# Patient Record
Sex: Female | Born: 1955
Health system: Southern US, Community
[De-identification: ages and names within clinical notes are randomized; demographics above are authoritative.]

## PROBLEM LIST (undated history)

## (undated) DIAGNOSIS — E559 Vitamin D deficiency, unspecified: Secondary | ICD-10-CM

## (undated) DIAGNOSIS — G4730 Sleep apnea, unspecified: Secondary | ICD-10-CM

## (undated) DIAGNOSIS — M1990 Unspecified osteoarthritis, unspecified site: Secondary | ICD-10-CM

## (undated) DIAGNOSIS — F329 Major depressive disorder, single episode, unspecified: Secondary | ICD-10-CM

## (undated) DIAGNOSIS — R42 Dizziness and giddiness: Secondary | ICD-10-CM

## (undated) DIAGNOSIS — I10 Essential (primary) hypertension: Secondary | ICD-10-CM

## (undated) DIAGNOSIS — M549 Dorsalgia, unspecified: Secondary | ICD-10-CM

## (undated) DIAGNOSIS — I739 Peripheral vascular disease, unspecified: Secondary | ICD-10-CM

## (undated) DIAGNOSIS — M2550 Pain in unspecified joint: Secondary | ICD-10-CM

## (undated) DIAGNOSIS — T8859XA Other complications of anesthesia, initial encounter: Secondary | ICD-10-CM

## (undated) DIAGNOSIS — E119 Type 2 diabetes mellitus without complications: Secondary | ICD-10-CM

## (undated) DIAGNOSIS — I499 Cardiac arrhythmia, unspecified: Secondary | ICD-10-CM

## (undated) DIAGNOSIS — F419 Anxiety disorder, unspecified: Secondary | ICD-10-CM

## (undated) DIAGNOSIS — R0602 Shortness of breath: Secondary | ICD-10-CM

## (undated) DIAGNOSIS — E785 Hyperlipidemia, unspecified: Secondary | ICD-10-CM

## (undated) DIAGNOSIS — I6529 Occlusion and stenosis of unspecified carotid artery: Secondary | ICD-10-CM

## (undated) DIAGNOSIS — R011 Cardiac murmur, unspecified: Secondary | ICD-10-CM

## (undated) DIAGNOSIS — R7302 Impaired glucose tolerance (oral): Secondary | ICD-10-CM

## (undated) DIAGNOSIS — E669 Obesity, unspecified: Secondary | ICD-10-CM

## (undated) DIAGNOSIS — F32A Depression, unspecified: Secondary | ICD-10-CM

## (undated) DIAGNOSIS — C4491 Basal cell carcinoma of skin, unspecified: Secondary | ICD-10-CM

## (undated) DIAGNOSIS — Z9289 Personal history of other medical treatment: Secondary | ICD-10-CM

## (undated) DIAGNOSIS — J301 Allergic rhinitis due to pollen: Secondary | ICD-10-CM

## (undated) DIAGNOSIS — K5900 Constipation, unspecified: Secondary | ICD-10-CM

## (undated) DIAGNOSIS — M722 Plantar fascial fibromatosis: Secondary | ICD-10-CM

## (undated) DIAGNOSIS — I2510 Atherosclerotic heart disease of native coronary artery without angina pectoris: Secondary | ICD-10-CM

## (undated) DIAGNOSIS — R600 Localized edema: Secondary | ICD-10-CM

## (undated) HISTORY — DX: Occlusion and stenosis of unspecified carotid artery: I65.29

## (undated) HISTORY — DX: Allergic rhinitis due to pollen: J30.1

## (undated) HISTORY — PX: KNEE SURGERY: SHX244

## (undated) HISTORY — DX: Plantar fascial fibromatosis: M72.2

## (undated) HISTORY — DX: Obesity, unspecified: E66.9

## (undated) HISTORY — DX: Dizziness and giddiness: R42

## (undated) HISTORY — DX: Unspecified osteoarthritis, unspecified site: M19.90

## (undated) HISTORY — DX: Hyperlipidemia, unspecified: E78.5

## (undated) HISTORY — DX: Anxiety disorder, unspecified: F41.9

## (undated) HISTORY — DX: Impaired glucose tolerance (oral): R73.02

## (undated) HISTORY — PX: TONSILLECTOMY: SUR1361

## (undated) HISTORY — DX: Cardiac murmur, unspecified: R01.1

## (undated) HISTORY — DX: Depression, unspecified: F32.A

## (undated) HISTORY — DX: Constipation, unspecified: K59.00

## (undated) HISTORY — DX: Dorsalgia, unspecified: M54.9

## (undated) HISTORY — PX: CARDIAC CATHETERIZATION: SHX172

## (undated) HISTORY — PX: ROTATOR CUFF REPAIR: SHX139

## (undated) HISTORY — DX: Pain in unspecified joint: M25.50

## (undated) HISTORY — PX: CARPAL TUNNEL RELEASE: SHX101

## (undated) HISTORY — DX: Vitamin D deficiency, unspecified: E55.9

## (undated) HISTORY — PX: COLONOSCOPY: SHX174

---
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
## 1999-04-22 ENCOUNTER — Encounter (INDEPENDENT_AMBULATORY_CARE_PROVIDER_SITE_OTHER): Payer: Self-pay | Admitting: Specialist

## 1999-04-22 ENCOUNTER — Ambulatory Visit (HOSPITAL_COMMUNITY): Admission: RE | Admit: 1999-04-22 | Discharge: 1999-04-22 | Payer: Self-pay | Admitting: Family Medicine

## 1999-04-22 ENCOUNTER — Encounter: Payer: Self-pay | Admitting: Family Medicine

## 1999-10-22 ENCOUNTER — Encounter: Payer: Self-pay | Admitting: Internal Medicine

## 1999-10-22 ENCOUNTER — Ambulatory Visit (HOSPITAL_COMMUNITY): Admission: RE | Admit: 1999-10-22 | Discharge: 1999-10-22 | Payer: Self-pay | Admitting: Internal Medicine

## 2000-04-21 ENCOUNTER — Encounter: Admission: RE | Admit: 2000-04-21 | Discharge: 2000-04-21 | Payer: Self-pay | Admitting: Family Medicine

## 2000-04-21 ENCOUNTER — Encounter: Payer: Self-pay | Admitting: Family Medicine

## 2001-07-26 ENCOUNTER — Ambulatory Visit (HOSPITAL_COMMUNITY): Admission: RE | Admit: 2001-07-26 | Discharge: 2001-07-26 | Payer: Self-pay | Admitting: Family Medicine

## 2001-07-26 ENCOUNTER — Encounter: Payer: Self-pay | Admitting: Family Medicine

## 2003-05-20 ENCOUNTER — Encounter (INDEPENDENT_AMBULATORY_CARE_PROVIDER_SITE_OTHER): Payer: Self-pay | Admitting: Sports Medicine

## 2003-05-20 ENCOUNTER — Encounter: Admission: RE | Admit: 2003-05-20 | Discharge: 2003-05-20 | Payer: Self-pay | Admitting: Sports Medicine

## 2003-05-27 HISTORY — PX: CARPAL TUNNEL RELEASE: SHX101

## 2003-05-27 HISTORY — PX: BREAST BIOPSY: SHX20

## 2003-06-03 ENCOUNTER — Encounter: Admission: RE | Admit: 2003-06-03 | Discharge: 2003-09-01 | Payer: Self-pay | Admitting: Sports Medicine

## 2003-06-17 ENCOUNTER — Encounter: Admission: RE | Admit: 2003-06-17 | Discharge: 2003-06-17 | Payer: Self-pay | Admitting: Sports Medicine

## 2003-09-13 DIAGNOSIS — C4491 Basal cell carcinoma of skin, unspecified: Secondary | ICD-10-CM

## 2003-09-13 HISTORY — DX: Basal cell carcinoma of skin, unspecified: C44.91

## 2004-06-23 ENCOUNTER — Ambulatory Visit: Payer: Self-pay | Admitting: Sports Medicine

## 2004-07-13 ENCOUNTER — Encounter (INDEPENDENT_AMBULATORY_CARE_PROVIDER_SITE_OTHER): Payer: Self-pay | Admitting: Sports Medicine

## 2004-07-13 ENCOUNTER — Encounter (INDEPENDENT_AMBULATORY_CARE_PROVIDER_SITE_OTHER): Payer: Self-pay | Admitting: *Deleted

## 2004-07-13 ENCOUNTER — Ambulatory Visit: Payer: Self-pay | Admitting: Sports Medicine

## 2004-07-28 ENCOUNTER — Ambulatory Visit (HOSPITAL_COMMUNITY): Admission: RE | Admit: 2004-07-28 | Discharge: 2004-07-28 | Payer: Self-pay | Admitting: Sports Medicine

## 2004-09-21 ENCOUNTER — Ambulatory Visit: Payer: Self-pay | Admitting: Sports Medicine

## 2005-01-18 ENCOUNTER — Ambulatory Visit: Payer: Self-pay | Admitting: Sports Medicine

## 2005-02-15 ENCOUNTER — Ambulatory Visit (HOSPITAL_COMMUNITY): Admission: RE | Admit: 2005-02-15 | Discharge: 2005-02-15 | Payer: Self-pay | Admitting: Sports Medicine

## 2005-02-15 ENCOUNTER — Ambulatory Visit: Payer: Self-pay | Admitting: Sports Medicine

## 2005-03-23 ENCOUNTER — Ambulatory Visit (HOSPITAL_COMMUNITY): Admission: RE | Admit: 2005-03-23 | Discharge: 2005-03-23 | Payer: Self-pay | Admitting: Sports Medicine

## 2005-03-23 ENCOUNTER — Ambulatory Visit: Payer: Self-pay | Admitting: Cardiology

## 2005-03-23 ENCOUNTER — Encounter: Payer: Self-pay | Admitting: Cardiology

## 2005-04-05 ENCOUNTER — Ambulatory Visit: Payer: Self-pay | Admitting: Sports Medicine

## 2005-04-11 ENCOUNTER — Ambulatory Visit: Payer: Self-pay | Admitting: Cardiology

## 2005-05-11 ENCOUNTER — Ambulatory Visit (HOSPITAL_COMMUNITY): Admission: RE | Admit: 2005-05-11 | Discharge: 2005-05-11 | Payer: Self-pay | Admitting: Sports Medicine

## 2005-08-16 ENCOUNTER — Ambulatory Visit: Payer: Self-pay | Admitting: Sports Medicine

## 2005-08-31 ENCOUNTER — Ambulatory Visit (HOSPITAL_COMMUNITY): Admission: RE | Admit: 2005-08-31 | Discharge: 2005-08-31 | Payer: Self-pay | Admitting: Family Medicine

## 2006-01-31 ENCOUNTER — Ambulatory Visit: Payer: Self-pay | Admitting: Sports Medicine

## 2006-02-01 ENCOUNTER — Encounter (INDEPENDENT_AMBULATORY_CARE_PROVIDER_SITE_OTHER): Payer: Self-pay | Admitting: *Deleted

## 2006-02-01 ENCOUNTER — Ambulatory Visit (HOSPITAL_COMMUNITY): Admission: RE | Admit: 2006-02-01 | Discharge: 2006-02-01 | Payer: Self-pay | Admitting: Sports Medicine

## 2006-02-07 ENCOUNTER — Ambulatory Visit: Payer: Self-pay | Admitting: Sports Medicine

## 2006-03-02 ENCOUNTER — Ambulatory Visit: Payer: Self-pay | Admitting: Sports Medicine

## 2006-04-04 ENCOUNTER — Ambulatory Visit: Payer: Self-pay | Admitting: Sports Medicine

## 2006-05-17 ENCOUNTER — Ambulatory Visit: Payer: Self-pay | Admitting: Family Medicine

## 2006-05-22 ENCOUNTER — Encounter: Admission: RE | Admit: 2006-05-22 | Discharge: 2006-05-22 | Payer: Self-pay | Admitting: Family Medicine

## 2006-05-22 ENCOUNTER — Ambulatory Visit: Payer: Self-pay | Admitting: Family Medicine

## 2006-05-30 ENCOUNTER — Ambulatory Visit: Payer: Self-pay | Admitting: Sports Medicine

## 2006-06-02 ENCOUNTER — Encounter: Admission: RE | Admit: 2006-06-02 | Discharge: 2006-06-02 | Payer: Self-pay | Admitting: Sports Medicine

## 2006-06-06 ENCOUNTER — Ambulatory Visit: Payer: Self-pay | Admitting: Sports Medicine

## 2006-09-19 ENCOUNTER — Ambulatory Visit (HOSPITAL_COMMUNITY): Admission: RE | Admit: 2006-09-19 | Discharge: 2006-09-19 | Payer: Self-pay | Admitting: Family Medicine

## 2006-11-09 DIAGNOSIS — IMO0002 Reserved for concepts with insufficient information to code with codable children: Secondary | ICD-10-CM | POA: Insufficient documentation

## 2006-11-09 DIAGNOSIS — I10 Essential (primary) hypertension: Secondary | ICD-10-CM | POA: Insufficient documentation

## 2006-11-09 DIAGNOSIS — M1710 Unilateral primary osteoarthritis, unspecified knee: Secondary | ICD-10-CM

## 2006-11-09 DIAGNOSIS — E669 Obesity, unspecified: Secondary | ICD-10-CM | POA: Insufficient documentation

## 2006-11-10 ENCOUNTER — Encounter (INDEPENDENT_AMBULATORY_CARE_PROVIDER_SITE_OTHER): Payer: Self-pay | Admitting: *Deleted

## 2007-01-08 ENCOUNTER — Telehealth: Payer: Self-pay | Admitting: *Deleted

## 2007-01-08 ENCOUNTER — Ambulatory Visit: Payer: Self-pay | Admitting: Sports Medicine

## 2007-01-08 DIAGNOSIS — J301 Allergic rhinitis due to pollen: Secondary | ICD-10-CM | POA: Insufficient documentation

## 2007-04-23 ENCOUNTER — Ambulatory Visit: Payer: Self-pay | Admitting: Family Medicine

## 2007-04-23 ENCOUNTER — Encounter: Payer: Self-pay | Admitting: Family Medicine

## 2007-04-23 LAB — CONVERTED CEMR LAB
AST: 20 units/L (ref 0–37)
BUN: 11 mg/dL (ref 6–23)
Calcium: 9 mg/dL (ref 8.4–10.5)
Potassium: 4.1 meq/L (ref 3.5–5.3)
Sodium: 139 meq/L (ref 135–145)
Total Bilirubin: 0.5 mg/dL (ref 0.3–1.2)

## 2007-04-25 ENCOUNTER — Encounter: Payer: Self-pay | Admitting: Family Medicine

## 2007-04-27 ENCOUNTER — Encounter: Payer: Self-pay | Admitting: Family Medicine

## 2007-05-02 ENCOUNTER — Encounter: Payer: Self-pay | Admitting: *Deleted

## 2007-05-08 ENCOUNTER — Encounter: Payer: Self-pay | Admitting: Family Medicine

## 2007-05-21 ENCOUNTER — Ambulatory Visit: Payer: Self-pay | Admitting: Family Medicine

## 2007-05-21 DIAGNOSIS — E785 Hyperlipidemia, unspecified: Secondary | ICD-10-CM | POA: Insufficient documentation

## 2007-05-21 DIAGNOSIS — E1169 Type 2 diabetes mellitus with other specified complication: Secondary | ICD-10-CM | POA: Insufficient documentation

## 2007-06-08 ENCOUNTER — Encounter: Payer: Self-pay | Admitting: Family Medicine

## 2007-10-15 ENCOUNTER — Ambulatory Visit (HOSPITAL_COMMUNITY): Admission: RE | Admit: 2007-10-15 | Discharge: 2007-10-15 | Payer: Self-pay | Admitting: Family Medicine

## 2008-05-14 ENCOUNTER — Ambulatory Visit: Payer: Self-pay | Admitting: Family Medicine

## 2008-05-14 ENCOUNTER — Encounter: Payer: Self-pay | Admitting: Family Medicine

## 2008-05-14 DIAGNOSIS — R5381 Other malaise: Secondary | ICD-10-CM | POA: Insufficient documentation

## 2008-05-14 DIAGNOSIS — M722 Plantar fascial fibromatosis: Secondary | ICD-10-CM | POA: Insufficient documentation

## 2008-05-14 DIAGNOSIS — R5383 Other fatigue: Secondary | ICD-10-CM | POA: Insufficient documentation

## 2008-05-14 LAB — CONVERTED CEMR LAB
ALT: 21 units/L (ref 0–35)
Alkaline Phosphatase: 58 units/L (ref 39–117)
BUN: 12 mg/dL (ref 6–23)
Creatinine, Ser: 0.69 mg/dL (ref 0.40–1.20)
Glucose, Bld: 134 mg/dL — ABNORMAL HIGH (ref 70–99)
HDL: 40 mg/dL (ref >39)
Potassium: 3.7 meq/L (ref 3.5–5.3)
Sodium: 139 meq/L (ref 135–145)
Total Bilirubin: 0.6 mg/dL (ref 0.3–1.2)
Total Protein: 6.9 g/dL (ref 6.0–8.3)

## 2008-05-16 ENCOUNTER — Encounter: Payer: Self-pay | Admitting: Family Medicine

## 2008-05-16 ENCOUNTER — Telehealth: Payer: Self-pay | Admitting: *Deleted

## 2008-05-20 ENCOUNTER — Encounter: Payer: Self-pay | Admitting: Family Medicine

## 2008-06-09 ENCOUNTER — Ambulatory Visit: Payer: Self-pay | Admitting: Family Medicine

## 2008-06-09 LAB — CONVERTED CEMR LAB
BUN: 12 mg/dL (ref 6–23)
CO2: 22 meq/L (ref 19–32)
Creatinine, Ser: 0.79 mg/dL (ref 0.40–1.20)

## 2008-06-10 ENCOUNTER — Encounter: Payer: Self-pay | Admitting: Family Medicine

## 2008-06-16 ENCOUNTER — Ambulatory Visit: Payer: Self-pay | Admitting: Family Medicine

## 2008-06-16 ENCOUNTER — Encounter: Payer: Self-pay | Admitting: Family Medicine

## 2008-06-16 DIAGNOSIS — T148 Other injury of unspecified body region: Secondary | ICD-10-CM | POA: Insufficient documentation

## 2008-06-16 DIAGNOSIS — W57XXXA Bitten or stung by nonvenomous insect and other nonvenomous arthropods, initial encounter: Secondary | ICD-10-CM | POA: Insufficient documentation

## 2008-07-24 ENCOUNTER — Encounter: Payer: Self-pay | Admitting: Family Medicine

## 2008-07-30 ENCOUNTER — Telehealth: Payer: Self-pay | Admitting: *Deleted

## 2008-07-30 ENCOUNTER — Ambulatory Visit: Payer: Self-pay | Admitting: Family Medicine

## 2008-07-30 DIAGNOSIS — H1030 Unspecified acute conjunctivitis, unspecified eye: Secondary | ICD-10-CM | POA: Insufficient documentation

## 2008-07-30 DIAGNOSIS — J029 Acute pharyngitis, unspecified: Secondary | ICD-10-CM | POA: Insufficient documentation

## 2008-11-04 ENCOUNTER — Ambulatory Visit (HOSPITAL_COMMUNITY): Admission: RE | Admit: 2008-11-04 | Discharge: 2008-11-04 | Payer: Self-pay | Admitting: Family Medicine

## 2008-11-10 ENCOUNTER — Encounter: Payer: Self-pay | Admitting: Family Medicine

## 2009-05-04 ENCOUNTER — Telehealth: Payer: Self-pay | Admitting: Family Medicine

## 2009-05-27 ENCOUNTER — Ambulatory Visit: Payer: Self-pay | Admitting: Family Medicine

## 2009-05-27 DIAGNOSIS — R011 Cardiac murmur, unspecified: Secondary | ICD-10-CM | POA: Insufficient documentation

## 2009-05-27 LAB — CONVERTED CEMR LAB
ALT: 37 units/L — ABNORMAL HIGH (ref 0–35)
AST: 34 units/L (ref 0–37)
Albumin: 3.9 g/dL (ref 3.5–5.2)
Alkaline Phosphatase: 64 units/L (ref 39–117)
Chloride: 98 meq/L (ref 96–112)
Cholesterol: 206 mg/dL — ABNORMAL HIGH (ref 0–200)
Glucose, Bld: 153 mg/dL — ABNORMAL HIGH (ref 70–99)
TSH: 2.338 microintl units/mL (ref 0.350–4.500)
Total Bilirubin: 0.4 mg/dL (ref 0.3–1.2)
Triglycerides: 678 mg/dL — ABNORMAL HIGH (ref <150)

## 2009-06-01 ENCOUNTER — Encounter: Payer: Self-pay | Admitting: Family Medicine

## 2009-07-01 ENCOUNTER — Ambulatory Visit: Payer: Self-pay | Admitting: Family Medicine

## 2009-08-26 ENCOUNTER — Ambulatory Visit: Payer: Self-pay | Admitting: Family Medicine

## 2009-08-26 ENCOUNTER — Telehealth: Payer: Self-pay | Admitting: Family Medicine

## 2009-08-28 ENCOUNTER — Encounter: Payer: Self-pay | Admitting: *Deleted

## 2009-08-28 ENCOUNTER — Ambulatory Visit: Payer: Self-pay | Admitting: Family Medicine

## 2009-08-28 DIAGNOSIS — B029 Zoster without complications: Secondary | ICD-10-CM | POA: Insufficient documentation

## 2009-11-03 ENCOUNTER — Ambulatory Visit: Payer: Self-pay | Admitting: Family Medicine

## 2009-11-03 DIAGNOSIS — S86819A Strain of other muscle(s) and tendon(s) at lower leg level, unspecified leg, initial encounter: Secondary | ICD-10-CM

## 2009-11-03 DIAGNOSIS — S838X9A Sprain of other specified parts of unspecified knee, initial encounter: Secondary | ICD-10-CM | POA: Insufficient documentation

## 2009-11-05 ENCOUNTER — Ambulatory Visit (HOSPITAL_COMMUNITY): Admission: RE | Admit: 2009-11-05 | Discharge: 2009-11-05 | Payer: Self-pay | Admitting: Family Medicine

## 2009-12-09 ENCOUNTER — Ambulatory Visit: Payer: Self-pay | Admitting: Family Medicine

## 2009-12-09 DIAGNOSIS — E1122 Type 2 diabetes mellitus with diabetic chronic kidney disease: Secondary | ICD-10-CM | POA: Insufficient documentation

## 2009-12-09 DIAGNOSIS — H8309 Labyrinthitis, unspecified ear: Secondary | ICD-10-CM | POA: Insufficient documentation

## 2009-12-09 DIAGNOSIS — E1165 Type 2 diabetes mellitus with hyperglycemia: Secondary | ICD-10-CM | POA: Insufficient documentation

## 2009-12-09 DIAGNOSIS — E119 Type 2 diabetes mellitus without complications: Secondary | ICD-10-CM | POA: Insufficient documentation

## 2009-12-09 DIAGNOSIS — E1140 Type 2 diabetes mellitus with diabetic neuropathy, unspecified: Secondary | ICD-10-CM

## 2009-12-09 LAB — CONVERTED CEMR LAB
CO2: 25 meq/L (ref 19–32)
Cholesterol: 221 mg/dL — ABNORMAL HIGH (ref 0–200)
Glucose, Bld: 163 mg/dL — ABNORMAL HIGH (ref 70–99)
HDL: 38 mg/dL — ABNORMAL LOW (ref >39)
Hgb A1c MFr Bld: 6.8 %
Sodium: 137 meq/L (ref 135–145)
Total CHOL/HDL Ratio: 5.8
Triglycerides: 608 mg/dL — ABNORMAL HIGH (ref <150)

## 2009-12-11 ENCOUNTER — Encounter: Payer: Self-pay | Admitting: Family Medicine

## 2009-12-25 ENCOUNTER — Encounter: Payer: Self-pay | Admitting: Family Medicine

## 2010-05-12 ENCOUNTER — Ambulatory Visit: Payer: Self-pay | Admitting: Family Medicine

## 2010-05-12 LAB — CONVERTED CEMR LAB
ALT: 36 units/L — ABNORMAL HIGH (ref 0–35)
Albumin: 4.3 g/dL (ref 3.5–5.2)
Alkaline Phosphatase: 57 units/L (ref 39–117)
BUN: 16 mg/dL (ref 6–23)
Calcium: 9.2 mg/dL (ref 8.4–10.5)
Chloride: 101 meq/L (ref 96–112)
Hgb A1c MFr Bld: 6.6 %
Sodium: 141 meq/L (ref 135–145)
Total Protein: 6.7 g/dL (ref 6.0–8.3)

## 2010-05-13 ENCOUNTER — Telehealth: Payer: Self-pay | Admitting: *Deleted

## 2010-05-14 ENCOUNTER — Encounter: Payer: Self-pay | Admitting: Family Medicine

## 2010-06-07 ENCOUNTER — Encounter: Payer: Self-pay | Admitting: Family Medicine

## 2010-07-28 ENCOUNTER — Ambulatory Visit: Payer: Self-pay | Admitting: Family Medicine

## 2010-07-28 ENCOUNTER — Telehealth (INDEPENDENT_AMBULATORY_CARE_PROVIDER_SITE_OTHER): Payer: Self-pay | Admitting: *Deleted

## 2010-10-12 NOTE — Letter (Signed)
Summary: LAB Letter  [facility]  [address]   [place], [place] [address]   Phone: [phone]  Fax: [phone]    12/11/2009  RUTHMAE RUSCHE [place], [place]  [address]  Dear Ms. BREECE,  So all of your labs looked OK EXCPT your blood sugar was 163 (normal is 100)--your long term blood sugar control is actually still Ok. We measure that with a test called an A1C and the magic number is goal of less than 7.0 Your was 6.8---so we are catching this early. I would say that this is probably type 2 diabetes, but very early. For that I have called in a rx for metformin--take one tab a day. This will increase your body's sensitivity to insulin. I will need to see you back in 3 months to follow this up. Metformin MAY help some with weight loss--I agree this is agreat time to return to Weight Watcher's.  The second lab that was a little off was your triglycerides--they were 608 and a good number is less than 150. Some of this could be due to elevated blood sugars as well. So I am doubly glad we are starting the metformin and the weight watcher's  I will paste a copy of the exact labs below. great to see you! See me back in 3 months.  Cholesterol          [H]  221 mg/dL                   0-200     ATP III Classification:           < 200        mg/dL        Desirable          200 - 239     mg/dL        Borderline High          >= 240        mg/dL        High         Triglyceride         [H]  608 mg/dL                   <150   HDL Cholesterol      [L]  38 mg/dL                    >39   Total Chol/HDL Ratio      5.8 Ratio  VLDL Cholesterol (Calc)                             NOT CALC mg/dL              0-40           Not calculated due to Triglyceride >400.     Suggest ordering Direct LDL (Unit Code: [id number]).  LDL Cholesterol (Calc)                             See Comment mg/dL           0-99           Not calculated due to Triglyceride  >400.         Sincerely,   [name] MD  Appended Document: LAB Letter mailed.

## 2010-10-12 NOTE — Letter (Signed)
Summary: Generic Letter  [facility]  [address]   [place], [place] [address]   Phone: [phone]  Fax: [phone]    06/07/2010  Pamela Thomas [name], [name]  [address]  Dear Ms. Franchot Mimes,  You had asked about some bariatric surgery resources. At one time we had a pamphlet, but I cannot find it now. So I just searched the internet for the places I know that are reputable and do tat kind of surgery. So the info fr4om those web sites are enclosed.  Hope you are wll!          Sincerely,   [name] MD

## 2010-10-12 NOTE — Assessment & Plan Note (Signed)
Summary: fu and lab/kh   Vital Signs:  Patient profile:   55 year old female Height:      62.25 inches Weight:      255.8 pounds BMI:     46.58 Temp:     98.4 degrees F oral Pulse rate:   89 / minute BP sitting:   136 / 84  (left arm) Cuff size:   large  Vitals Entered By: Isabelle Course (December 09, 2009 8:42 AM) CC: F/U and Labs, C/O sinus issues Is Patient Diabetic? No Pain Assessment Patient in pain? no        CC:  F/U and Labs and C/O sinus issues.  Habits & Providers  Alcohol-Tobacco-Diet     Tobacco Status: quit     Tobacco Counseling: to quit use of tobacco products     Year Quit: 1988  Allergies: No Known Drug Allergies  Social History: Smoking Status:  quit   Complete Medication List: 1)  Cozaar 100 Mg Tabs (Losartan potassium) .Marland Kitchen.. 1 by mouth once daily 2)  Metoprolol Succinate 100 Mg Tb24 (Metoprolol succinate) .... 3 tabs qd 3)  Klor-con M20 20 Meq Tbcr (Potassium chloride crys cr) .Marland Kitchen.. 1 by mouth once daily 4)  Meloxicam 7.5 Mg Tabs (Meloxicam) .Marland Kitchen.. 1 by mouth once daily or two times a day 5)  Furosemide 20 Mg Tabs (Furosemide) .Marland Kitchen.. 1 by mouth once daily 6)  Triamterene-hctz 37.5-25 Mg Caps (Triamterene-hctz) .Marland Kitchen.. 1 by mouth once daily 7)  Ra Fish Oil 1000 Mg Caps (Omega-3 fatty acids) .... Two times a day 8)  Bactrim Ds 800-160 Mg Tabs (Sulfamethoxazole-trimethoprim) .Marland Kitchen.. 1 by mouth bid  Other Orders: Lipid-[facility] [id number]) Comp Met-[facility] [id number]) A1C-[facility] [id number]) ENT Referral (ENT) [facility]- Est  Level 4 [id number])  Patient Instructions: 1)  Please see your opthalmologist about your left eye watering--it may be a hold over from the shingles episode. 2)  We will set up an appointment for an ENT (ear, nose throat) doctor and call you with that. This is to further evaluate your dizziness. 3)  I will send you a note about your labs. Prescriptions: BACTRIM DS 800-160 MG TABS (SULFAMETHOXAZOLE-TRIMETHOPRIM) 1 by mouth bid  #20 x 1   Entered and  Authorized by:   [name] MD   Signed by:   [name] MD on 12/09/2009   Method used:   Print then Give to Patient   RxID:   [id number]   Laboratory Results   Blood Tests   Date/Time Received: December 09, 2009 9:13 AM  Date/Time Reported: December 09, 2009 9:36 AM   HGBA1C: 6.8%   (Normal Range: Non-Diabetic - 3-6%   Control Diabetic - 6-8%)  Comments: ...............test performed by......Marland KitchenBonnie A. Martinique, MLS (ASCP)cm      Appended Document: fu and lab/kh    Clinical Lists Changes  Medications: Removed medication of BACTRIM DS 800-160 MG TABS (SULFAMETHOXAZOLE-TRIMETHOPRIM) 1 by mouth bid Added new medication of METFORMIN HCL 500 MG TABS (METFORMIN HCL) 1 by mouth qd - Signed Rx of METFORMIN HCL 500 MG TABS (METFORMIN HCL) 1 by mouth qd;  #30 x 12;  Signed;  Entered by: [name] MD;  Authorized by: [name] MD;  Method used: [facility] [address] [phone]*, [place], [place], [place], [place]  [address], Ph: [phone] or [phone], Fax: [phone] Observations: Added new observation of HEART EXAM: normal rate, regular rhythm, and no murmur.   (12/11/2009 16:14) Added new observation of LUNG EXAM: normal respiratory effort and  normal breath sounds.   (12/11/2009 16:14) Added new observation of NECK EXAM: supple, full ROM, and no masses.   (12/11/2009 16:14) Added new observation of EAR EXAM: no lesions noted in EAC. TM some scarring noted on  right Tm but o/w wnl (12/11/2009 16:14) Added new observation of EYE EXAM: left eye excessive tears PERRLA EOMI no conjunctival injection (12/11/2009 16:14) Added new observation of PEADULT: [name] MD ~General`Gen appear ~Eyes`Eye exam ~Ears`Ear exam ~Neck`NECK EXAM ~Lungs`lung exam ~Heart`Heart exam (12/11/2009 16:14) Added new observation of GEN APPEAR: overweight-appearing.   (12/11/2009 16:14) Added new observation of ROS: The patient denies anorexia, fever, weight loss, weight gain, vision  loss, chest pain, syncope, dyspnea on exertion, peripheral edema, prolonged cough, and abdominal pain.   (12/11/2009 16:14) Added new observation of ROS: GI: Denies abdominal pain (12/11/2009 16:14) Added new observation of ROS EYES: Denies vision loss (12/11/2009 16:14) Added new observation of ROS: CARDIAC: Denies weight gain, chest pain, syncope, dyspnea on exertion, peripheral edema (12/11/2009 16:14) Added new observation of [id number]: Denies anorexia, fever, weight loss (12/11/2009 16:14) Added new observation of HPI: f/u elevated blood sugar  new problem of left eye watering--had shingles and was seen by optho twice and cleared. eye has been watering since then  notes dizziness (room spinning) worse over last few months--worst when she closes her eyes. Infact is so unsteady w her eyes clsed taht she has almost fallen in shower a few times.  f/u weight--has decided she needs to start weight watchers and has plans to do that soon (12/11/2009 16:14) Added new observation of PRIMARY MD: [name] MD (12/11/2009 16:14)    Prescriptions: METFORMIN HCL 500 MG TABS (METFORMIN HCL) 1 by mouth qd  #30 x 12   Entered and Authorized by:   [name] MD   Signed by:   [name] MD on 12/11/2009   Method used:   [facility] to        [facility] [address] [phone]* (retail)       [address]       [place], [place]  [address]       Ph: [phone] or [phone]       Fax: [phone]   [id number]     Impression & Recommendations:  Problem # 1:  UNSPECIFIED LABYRINTHITIS (ICD-386.30) referral to ENT  Problem # 2:  HYPERGLYCEMIA (ICD-790.29) A1C is 6.8.  will start metformin Her updated medication list for this problem includes:    Metformin Hcl 500 Mg Tabs (Metformin hcl) .Marland Kitchen... 1 by mouth qd   Problem # 3:  DYSLIPIDEMIA (ICD-272.4) starting metformin also agree w weight watchers diet rtc 3 m  Complete Medication List: 1)  Cozaar 100 Mg  Tabs (Losartan potassium) .Marland Kitchen.. 1 by mouth once daily 2)  Metoprolol Succinate 100 Mg Tb24 (Metoprolol succinate) .... 3 tabs qd 3)  Klor-con M20 20 Meq Tbcr (Potassium chloride crys cr) .Marland Kitchen.. 1 by mouth once daily 4)  Meloxicam 7.5 Mg Tabs (Meloxicam) .Marland Kitchen.. 1 by mouth once daily or two times a day 5)  Furosemide 20 Mg Tabs (Furosemide) .Marland Kitchen.. 1 by mouth once daily 6)  Triamterene-hctz 37.5-25 Mg Caps (Triamterene-hctz) .Marland Kitchen.. 1 by mouth once daily 7)  Ra Fish Oil 1000 Mg Caps (Omega-3 fatty acids) .... Two times a day 8)  Metformin Hcl 500 Mg Tabs (Metformin hcl) .Marland Kitchen.. 1 by mouth qd   Primary Care Provider:  [name] MD  History of Present Illness: f/u elevated blood sugar  new problem of left eye watering--had shingles and was seen by optho twice and cleared. eye has been watering since then  notes dizziness (room spinning) worse over last few months--worst when she closes her eyes. Infact is so unsteady w her eyes clsed taht she has almost fallen in shower a few times.  f/u weight--has decided she needs to start weight watchers and has plans to do that soon   Review of Systems  The patient denies anorexia, fever, weight loss, weight gain, vision loss, chest pain, syncope, dyspnea on exertion, peripheral edema, prolonged cough, and abdominal pain.     Physical Exam  General:  overweight-appearing.   Eyes:  left eye excessive tears PERRLA EOMI no conjunctival injection Ears:  no lesions noted in EAC. TM some scarring noted on  right Tm but o/w wnl Neck:  supple, full ROM, and no masses.   Lungs:  normal respiratory effort and normal breath sounds.   Heart:  normal rate, regular rhythm, and no murmur.

## 2010-10-12 NOTE — Letter (Signed)
Summary: LAB Letter  [facility]  [address]   [place], [place] [address]   Phone: [phone]  Fax: [phone]    05/14/2010  DAVONNA ROUND [place], [place]  [address]  Dear Ms. Franchot Mimes,  Your A1C dropped from 6.8 to 6.6--this is great! All of the other lab work including kidney function, liver function and electrolytes were normal.  Great to see you!         Sincerely,   [name] MD

## 2010-10-12 NOTE — Assessment & Plan Note (Signed)
Summary: hamstring prob,df   Vital Signs:  Patient profile:   55 year old female Weight:      244.7 pounds Pulse rate:   74 / minute BP sitting:   130 / 80  (left arm) Cuff size:   large  Vitals Entered By: Gerrit Heck slade,cma CC: pain right anterior thigh started saturday after moving things. refill meds. Is Patient Diabetic? No Pain Assessment Patient in pain? yes     Location: right thigh Intensity: 4 Onset of pain  since saturday   Primary Care Provider:  [name] MD  CC:  pain right anterior thigh started saturday after moving things. refill meds..  History of Present Illness: Pulled muscle in her left when working at home two days ago, she describes it under her left buttocks.  She has pain when she sits on a hard serface, she has a had time going up a stair.  Slept well last night.  It has also flaired her left knee that has severe DJD, matter of time before a TKR.  Needs to have her BP meds filled.  Reviewed her last blood work and discussed that she is flirting with diabetes.  She will make apt with primary MD in next month or so and come in fasting to check blood sugar and lipids.  Has occasional light headedness when going from lying to quickly.  Has never passed out.  Habits & Providers  Alcohol-Tobacco-Diet     Tobacco Status: quit > 6 months  Current Medications (verified): 1)  Cozaar 100 Mg Tabs (Losartan Potassium) .Marland Kitchen.. 1 By Mouth Once Daily 2)  Metoprolol Succinate 100 Mg  Tb24 (Metoprolol Succinate) .... 3 Tabs Qd 3)  Klor-Con M20 20 Meq  Tbcr (Potassium Chloride Crys Cr) .Marland Kitchen.. 1 By Mouth Once Daily 4)  Meloxicam 7.5 Mg  Tabs (Meloxicam) .Marland Kitchen.. 1 By Mouth Once Daily or Two Times A Day 5)  Furosemide 20 Mg  Tabs (Furosemide) .Marland Kitchen.. 1 By Mouth Once Daily 6)  Triamterene-Hctz 37.5-25 Mg Caps (Triamterene-Hctz) .Marland Kitchen.. 1 By Mouth Once Daily  Allergies (verified): No Known Drug Allergies  Social History: Smoking Status:  quit > 6 months  Physical  Exam  General:  Obese, alert Msk:  Negative straight leg raise, negative SI joints test, negative prirformis test,  Tender along upper insertion of hamstring on the let, pain with hamastring maneuvers. Skin:  Still with lesion of left face where she has Zoster in V2 distribution of trigeminal nerve.   Impression & Recommendations:  Problem # 1:  MUSCLE STRAIN, HAMSTRING MUSCLE (ICD-844.8) Stretching, ice, NSAIDS Orders: [place]- Est  Level 4 [id number])  Problem # 2:  HYPERTENSION, BENIGN SYSTEMIC (ICD-401.1) Requested refill on meds, counseled on weight loss, and orthostasis Her updated medication list for this problem includes:    Cozaar 100 Mg Tabs (Losartan potassium) .Marland Kitchen... 1 by mouth once daily    Metoprolol Succinate 100 Mg Tb24 (Metoprolol succinate) .Marland KitchenMarland KitchenMarland KitchenMarland Kitchen 3 tabs qd    Furosemide 20 Mg Tabs (Furosemide) .Marland Kitchen... 1 by mouth once daily    Triamterene-hctz 37.5-25 Mg Caps (Triamterene-hctz) .Marland Kitchen... 1 by mouth once daily  Orders: [facility]- Est  Level 4 [id number])  Problem # 3:  DYSLIPIDEMIA (ICD-272.4) Counseled on diet, she will make apt with primary MD and come in for fasting lipids.  Begin fish oil 1000 mg two times a day.  Complete Medication List: 1)  Cozaar 100 Mg Tabs (Losartan potassium) .Marland Kitchen.. 1 by mouth once daily 2)  Metoprolol Succinate 100 Mg Tb24 (Metoprolol succinate) .Marland KitchenMarland KitchenMarland Kitchen  3 tabs qd 3)  Klor-con M20 20 Meq Tbcr (Potassium chloride crys cr) .Marland Kitchen.. 1 by mouth once daily 4)  Meloxicam 7.5 Mg Tabs (Meloxicam) .Marland Kitchen.. 1 by mouth once daily or two times a day 5)  Furosemide 20 Mg Tabs (Furosemide) .Marland Kitchen.. 1 by mouth once daily 6)  Triamterene-hctz 37.5-25 Mg Caps (Triamterene-hctz) .Marland Kitchen.. 1 by mouth once daily 7)  Ra Fish Oil 1000 Mg Caps (Omega-3 fatty acids) .... Two times a day  Patient Instructions: 1)  Please schedule a follow-up appointment in next few months with Dr. Nori Riis first in AM, and come in fasting. .  Prescriptions: TRIAMTERENE-HCTZ 37.5-25 MG CAPS (TRIAMTERENE-HCTZ) 1 by mouth once  daily  #30 x 6   Entered and Authorized by:   Tereasa Coop NP   Signed by:   Tereasa Coop NP on 11/03/2009   Method used:   [facility] to        [facility] [address] [phone]* (retail)       [address] [place] [address]       [place], [place]  [address]       Ph: [phone] or [phone]       Fax: [phone]   RxID:   [id number] FUROSEMIDE 20 MG  TABS (FUROSEMIDE) 1 by mouth once daily  #90 x 3   Entered and Authorized by:   Tereasa Coop NP   Signed by:   Tereasa Coop NP on 11/03/2009   Method used:   Electronically to        [facility] [address] [phone]* (retail)       [place] [address]       [place], [place]  [address]       Ph: [phone] or [phone]       Fax: [phone]   RxID:   [id number] KLOR-CON M20 20 MEQ  TBCR (POTASSIUM CHLORIDE CRYS CR) 1 by mouth once daily  #30 x 6   Entered and Authorized by:   Tereasa Coop NP   Signed by:   Tereasa Coop NP on 11/03/2009   Method used:   Electronically to        [facility] [address] [phone]* (retail)       [place] [address]       [place], [place]  [address]       Ph: [phone] or [phone]       Fax: [phone]   RxID:   [id number] METOPROLOL SUCCINATE 100 MG  TB24 (METOPROLOL SUCCINATE) 3 tabs qd  #90 x 6   Entered and Authorized by:   Tereasa Coop NP   Signed by:   Tereasa Coop NP on 11/03/2009   Method used:   Electronically to        [facility] [address] [phone]* (retail)       [place] [address]       [place], [place]  [address]       Ph: [phone] or [phone]       Fax: [phone]   RxID:   [id number] COZAAR 100 MG TABS (LOSARTAN POTASSIUM) 1 by mouth once daily  #30 x 6   Entered and Authorized by:   Tereasa Coop NP   Signed by:   Tereasa Coop NP on 11/03/2009   Method used:   Electronically to  [facility] [address] [phone]* (retail)       [address]       [place], [place]   [address]       Ph: [phone] or [phone]       Fax: [phone]   RxID:   [id number]

## 2010-10-12 NOTE — Assessment & Plan Note (Signed)
Summary: f/up,tcb   Vital Signs:  Patient profile:   55 year old female Weight:      244 pounds Temp:     97.7 degrees F oral Pulse rate:   77 / minute BP sitting:   120 / 80  (left arm) Cuff size:   large  Vitals Entered By: Audelia Hives CMA (May 12, 2010 8:42 AM)   Primary Care Provider:  [name] MD   History of Present Illness: Hyperglycemia / Early diabetes follow up with blood sugars in good   good control,   no episodes of low blood sugar. Taking medicines regularly and having no problems with them. Actually thinks she feels better ON the metformin.  Obesity: wants info on surgical options  3) Lots of family stressors re her mother's decline 4) Follow up hypertension. Taking medicines regularly with no problems. Not having any any headaches or chest pains.    Current Medications (verified): 1)  Cozaar 100 Mg Tabs (Losartan Potassium) .Marland Kitchen.. 1 By Mouth Once Daily 2)  Metoprolol Succinate 100 Mg  Tb24 (Metoprolol Succinate) .... 3 Tabs Qd 3)  Klor-Con M20 20 Meq  Tbcr (Potassium Chloride Crys Cr) .Marland Kitchen.. 1 By Mouth Once Daily 4)  Meloxicam 7.5 Mg  Tabs (Meloxicam) .Marland Kitchen.. 1 By Mouth Once Daily or Two Times A Day 5)  Furosemide 20 Mg  Tabs (Furosemide) .Marland Kitchen.. 1 By Mouth Once Daily 6)  Triamterene-Hctz 37.5-25 Mg Caps (Triamterene-Hctz) .Marland Kitchen.. 1 By Mouth Once Daily 7)  Ra Fish Oil 1000 Mg Caps (Omega-3 Fatty Acids) .... Two Times A Day 8)  Metformin Hcl 500 Mg Tabs (Metformin Hcl) .Marland Kitchen.. 1 By Mouth Qd  Allergies: No Known Drug Allergies  Review of Systems  The patient denies anorexia, fever, weight loss, weight gain, chest pain, syncope, dyspnea on exertion, peripheral edema, prolonged cough, headaches, hemoptysis, abdominal pain, severe indigestion/heartburn, and muscle weakness.         Please see HPI for additional ROS.   Physical Exam  General:  alert, well-developed, well-nourished, well-hydrated, and overweight-appearing.   Neck:  supple, full ROM, and no masses.     Lungs:  normal respiratory effort and normal breath sounds.   Heart:  normal rate, regular rhythm, and no murmur.   Extremities:  no edema Neurologic:  alert & oriented X3 and gait normal.   Psych:  Oriented X3, memory intact for recent and remote, normally interactive, and good eye contact.  mildly depressed affect   Impression & Recommendations:  Problem # 1:  FAMILY STRESS (ICD-V61.9)  Orders: [facility]- Est  Level 4 [id number]) long discussion re options  Problem # 2:  HYPERGLYCEMIA (ICD-790.29)  Her updated medication list for this problem includes:    Metformin Hcl 500 Mg Tabs (Metformin hcl) .Marland Kitchen... 1 by mouth qd  Orders: A1C-[facility] [id number]) Comp Met-[facility] [id number]) [facility]- Est  Level 4 [id number]) continue metformin rtc 3 m  Problem # 3:  OBESITY, NOS (ICD-278.00) discussed and will try to get her some info on surgical options  Complete Medication List: 1)  Cozaar 100 Mg Tabs (Losartan potassium) .Marland Kitchen.. 1 by mouth once daily 2)  Metoprolol Succinate 100 Mg Tb24 (Metoprolol succinate) .... 3 tabs qd 3)  Klor-con M20 20 Meq Tbcr (Potassium chloride crys cr) .Marland Kitchen.. 1 by mouth once daily 4)  Meloxicam 7.5 Mg Tabs (Meloxicam) .Marland Kitchen.. 1 by mouth once daily or two times a day 5)  Furosemide 20 Mg Tabs (Furosemide) .Marland Kitchen.. 1 by mouth once daily 6)  Triamterene-hctz 37.5-25 Mg Caps (  Triamterene-hctz) .Marland Kitchen.. 1 by mouth once daily 7)  Ra Fish Oil 1000 Mg Caps (Omega-3 fatty acids) .... Two times a day 8)  Metformin Hcl 500 Mg Tabs (Metformin hcl) .Marland Kitchen.. 1 by mouth qd  Laboratory Results   Blood Tests   Date/Time Received: May 12, 2010 9:11 AM  Date/Time Reported: May 12, 2010 9:30 AM   HGBA1C: 6.6%   (Normal Range: Non-Diabetic - 3-6%   Control Diabetic - 6-8%)  Comments: ...........test performed by...........Marland KitchenHedy Camara, CMA     Prevention & Chronic Care Immunizations   Influenza vaccine: Fluvax Non-MCR  (07/01/2009)   Influenza vaccine due: 06/09/2009    Tetanus booster: Not  documented    Pneumococcal vaccine: Not documented  Colorectal Screening   Hemoccult: Not documented   Hemoccult due: Not Indicated    Colonoscopy: normal  (06/08/2007)   Colonoscopy due: 06/07/2017  Other Screening   Pap smear: Normal  (05/20/2008)   Pap smear due: 05/28/2011    Mammogram: ASSESSMENT: Negative - BI-RADS 1^MM DIGITAL SCREENING  (11/05/2009)   Mammogram due: 11/04/2009   Smoking status: quit  (12/09/2009)  Lipids   Total Cholesterol: 221  (12/09/2009)   LDL: See Comment mg/dL  (12/09/2009)   LDL Direct: Not documented   HDL: 38  (12/09/2009)   Triglycerides: 608  (12/09/2009)   Lipid panel due: 06/26/2009    SGOT (AST): 25  (12/09/2009)   SGPT (ALT): 29  (12/09/2009) CMP ordered    Alkaline phosphatase: 73  (12/09/2009)   Total bilirubin: 0.6  (12/09/2009)    Lipid flowsheet reviewed?: Yes   Progress toward LDL goal: Unchanged  Hypertension   Last Blood Pressure: 120 / 80  (05/12/2010)   Serum creatinine: 0.76  (12/09/2009)   Serum potassium 3.8  (12/09/2009) CMP ordered    Basic metabolic panel due: 99991111    Hypertension flowsheet reviewed?: Yes   Progress toward BP goal: At goal  Self-Management Support :    Hypertension self-management support: Written self-care plan  (07/01/2009)    Lipid self-management support: Not documented

## 2010-10-12 NOTE — Miscellaneous (Signed)
Summary: Re: ENT referral  Clinical Lists Changes   called [facility] to schedule appointment with Dr. May in otolaryngology. was told his appointments are booking into October.. Dr. Nori Riis was advised of this  it was decided to give patient the choice about scheduling or not at this time. Contacted patient and she states to hold off now on appointment . She plans to come back in to see Dr. Nori Riis for follow up in a few weeks and will discuss with her further at that time. Marcell Barlow RN  December 25, 2009 5:15 PM

## 2010-10-12 NOTE — Assessment & Plan Note (Signed)
Summary: ? pink eye/ls   Vital Signs:  Patient profile:   55 year old female Height:      62.25 inches Weight:      250 pounds BMI:     45.52 Temp:     98.8 degrees F oral Pulse rate:   80 / minute BP sitting:   122 / 84  (left arm) Cuff size:   large  Vitals Entered By: Levert Feinstein LPN (November 16, 624THL 3:36 PM) CC: pink eye Is Patient Diabetic? No Pain Assessment Patient in pain? no        Primary Care Provider:  [name] MD  CC:  pink eye.  History of Present Illness: 1. Pink eye? - Pt felt an irritation in her left eye yesterday.  This morning it was crusted shut and it was very red.  She doesn't have any known exposure or contact to pink eye.  She has not had it before.  She does have a hx of shingles around the left eye but without eye involvement.  This was about 1 year ago and it doesn't feel like that.    ROS: She denies any skin pain, pain with eye movement, vision changes, fever  Habits & Providers  Alcohol-Tobacco-Diet     Tobacco Status: quit     Tobacco Counseling: to quit use of tobacco products     Year Quit: 1988  Allergies: No Known Drug Allergies  Past History:  Past Medical History: Reviewed history from 04/23/2007 and no changes required. glucose intolerance a1c 6.1 5/07,  reaction to synvisc,  Rt. Knee mod osteoarthritis and Baker`s cyst 9/07  Physical Exam  General:  Vitals reviewed.  Obese.  Comfortable appearing.  alert, well-developed, well-nourished, well-hydrated Eyes:  vision grossly intact.  PERRL, EOMI.  Left eye with red conjuctiva, minimal purulent discharge.  Right eye normal.  No skin changes or skin pain.  No blisters. Nose:  no external deformity and no nasal discharge.  No sinus pain / pressure   Impression & Recommendations:  Problem # 1:  CONJUNCTIVITIS, ACUTE, LEFT (ICD-372.00) Assessment New  Likely from bacterial conjunctivitis.  No signs concerning for repeat shingles outbreak.  Will treat with Erythromycin.   Precautions for worsening of symptoms, vision changes, or other concerns. Her updated medication list for this problem includes:    Erythromycin 5 Mg/gm Oint (Erythromycin) ..... Instill 1/2 inch in affected eye 4 times a day for 5-7 days dispo: qs  Orders: Parker- Est Level  3 [id number])  Complete Medication List: 1)  Cozaar 100 Mg Tabs (Losartan potassium) .Marland Kitchen.. 1 by mouth once daily 2)  Metoprolol Succinate 100 Mg Tb24 (Metoprolol succinate) .... 3 tabs qd 3)  Klor-con M20 20 Meq Tbcr (Potassium chloride crys cr) .Marland Kitchen.. 1 by mouth once daily 4)  Meloxicam 7.5 Mg Tabs (Meloxicam) .Marland Kitchen.. 1 by mouth once daily or two times a day 5)  Furosemide 20 Mg Tabs (Furosemide) .Marland Kitchen.. 1 by mouth once daily 6)  Triamterene-hctz 37.5-25 Mg Caps (Triamterene-hctz) .Marland Kitchen.. 1 by mouth once daily 7)  Ra Fish Oil 1000 Mg Caps (Omega-3 fatty acids) .... Two times a day 8)  Metformin Hcl 500 Mg Tabs (Metformin hcl) .Marland Kitchen.. 1 by mouth qd 9)  Diprolene Af 0.05 % Crea (Betamethasone dipropionate aug) .... Disp 30 g tube apply affected area once daily or two times a day prn 10)  Erythromycin 5 Mg/gm Oint (Erythromycin) .... Instill 1/2 inch in affected eye 4 times a day for 5-7 days dispo: qs  Patient Instructions: 1)  I think that you most likely have pink eye 2)  We will treat that with Erythromycin ointment 3)  If you start to develop eye pain or symptoms of shingles again, call the eye doctor right away or return to clinic Prescriptions: ERYTHROMYCIN 5 MG/GM OINT (ERYTHROMYCIN) Instill 1/2 inch in affected eye 4 times a day for 5-7 days Dispo: QS  #1 x 0   Entered and Authorized by:   Mylinda Latina MD   Signed by:   Mylinda Latina MD on 07/28/2010   Method used:   [facility]        [facility] [address] [phone]* (retail)       [address]       [place], [place]  [address]       Ph: [phone] or [phone]       Fax: [phone]   RxID:   [id number]    Orders Added: 1)  Sanderson-  Est Level  3 [[id number]]  Appended Document: ? pink eye/ls    Clinical Lists Changes  Orders: Added new Service order of Flu Vaccine 29yrs + [phone]) - Signed Added new Service order of Admin 1st Vaccine [phone]) - Signed Observations: Added new observation of FLU VAX#1VIS: 04/06/10 version given July 29, 2010. (07/28/2010 17:13) Added new observation of FLU VAXLOT: [id number] (07/28/2010 17:13) Added new observation of FLU VAX EXP: 03/12/2011 (07/28/2010 17:13) Added new observation of FLU VAXBY: Jessica Fleeger CMA (07/28/2010 17:13) Added new observation of FLU VAXRTE: IM (07/28/2010 17:13) Added new observation of FLU VAX DSE: 0.5 ml (07/28/2010 17:13) Added new observation of FLU VAXMFR: GlaxoSmithKline (07/28/2010 17:13) Added new observation of FLU VAX SITE: left deltoid (07/28/2010 17:13) Added new observation of FLU VAX: Fluvax 3+ (07/28/2010 17:13)       Immunizations Administered:  Influenza Vaccine # 1:    Vaccine Type: Fluvax 3+    Site: left deltoid    Mfr: GlaxoSmithKline    Dose: 0.5 ml    Route: IM    Given by: Christen Bame CMA    Exp. Date: 03/12/2011    Lot #: [id number]    VIS given: 04/06/10 version given July 29, 2010.  Flu Vaccine Consent Questions:    Do you have a history of severe allergic reactions to this vaccine? no    Any prior history of allergic reactions to egg and/or gelatin? no    Do you have a sensitivity to the preservative Thimersol? no    Do you have a past history of Guillan-Barre Syndrome? no    Do you currently have an acute febrile illness? no    Have you ever had a severe reaction to latex? no    Vaccine information given and explained to patient? yes    Are you currently pregnant? no

## 2010-10-12 NOTE — Progress Notes (Signed)
  Phone Note Call from Patient   Caller: Patient Summary of Call: Did not receive rx for cream.  Also need refill on Metformin.  [facility] Initial call taken by: Pamala Hurry [email or web address]  Follow-up for Phone Call        Marcus let her know I have called in Thanks!  [name] MD  May 13, 2010 6:56 PM   Additional Follow-up for Phone Call Additional follow up Details #1::        Spoke with patient. She had picked up rx's this morning Additional Follow-up by: Enid Skeens, CMA,  May 14, 2010 9:43 AM    New/Updated Medications: DIPROLENE AF 0.05 % CREA (BETAMETHASONE DIPROPIONATE AUG) disp 30 g tube apply affected area once daily or two times a day prn Prescriptions: METFORMIN HCL 500 MG TABS (METFORMIN HCL) 1 by mouth qd  #90 x 3   Entered and Authorized by:   [name] MD   Signed by:   [name] MD on 05/13/2010   Method used:   [facility]        [facility] [address] [phone]* (retail)       [address]       [place], [place]  [address]       Ph: [phone] or [phone]       Fax: [phone]   RxID:   [id number] [place] AF 0.05 % CREA (BETAMETHASONE DIPROPIONATE AUG) disp 30 g tube apply affected area once daily or two times a day prn  #30 x 2   Entered and Authorized by:   [name] MD   Signed by:   [name] MD on 05/13/2010   Method used:   [facility]ronically to        [facility] [address] [phone]* (retail)       [address]       [place], [place]  [address]       Ph: [phone] or [phone]       Fax: [phone]   RxID:   [id number]

## 2010-10-12 NOTE — Progress Notes (Signed)
Summary: triage  Phone Note Call from Patient Call back at Home Phone [phone]   Caller: Patient Summary of Call: thinks she has pink eye and wants to know if she can get something called in w/o coming in Initial call taken by: Audie Clear,  July 28, 2010 10:40 AM  Follow-up for Phone Call        dvised patient that she will need to be seen. appointment scheduled today. Follow-up by: Marcell Barlow RN,  July 28, 2010 11:36 AM

## 2010-11-02 ENCOUNTER — Other Ambulatory Visit: Payer: Self-pay | Admitting: Family Medicine

## 2010-11-02 DIAGNOSIS — I10 Essential (primary) hypertension: Secondary | ICD-10-CM

## 2010-11-02 NOTE — Telephone Encounter (Signed)
Refill request

## 2010-11-04 ENCOUNTER — Other Ambulatory Visit: Payer: Self-pay | Admitting: Family Medicine

## 2010-11-04 NOTE — Telephone Encounter (Signed)
Refill request

## 2010-11-30 ENCOUNTER — Encounter: Payer: Self-pay | Admitting: Family Medicine

## 2010-12-03 ENCOUNTER — Other Ambulatory Visit: Payer: Self-pay | Admitting: Family Medicine

## 2010-12-03 DIAGNOSIS — Z1231 Encounter for screening mammogram for malignant neoplasm of breast: Secondary | ICD-10-CM

## 2010-12-09 ENCOUNTER — Ambulatory Visit (HOSPITAL_COMMUNITY)
Admission: RE | Admit: 2010-12-09 | Discharge: 2010-12-09 | Disposition: A | Discharge status: Home / Self Care | Payer: 59 | Source: Ambulatory Visit | Attending: Family Medicine | Admitting: Family Medicine

## 2010-12-09 DIAGNOSIS — Z1231 Encounter for screening mammogram for malignant neoplasm of breast: Secondary | ICD-10-CM | POA: Insufficient documentation

## 2010-12-09 NOTE — Miscellaneous (Signed)
  Clinical Lists Changes       Complete Medication List: 1)  Cozaar 100 Mg Tabs (Losartan potassium) .Marland Kitchen.. 1 by mouth once daily 2)  Metoprolol Succinate 100 Mg Tb24 (Metoprolol succinate) .... 3 tabs qd 3)  Klor-con M20 20 Meq Tbcr (Potassium chloride crys cr) .Marland Kitchen.. 1 by mouth once daily 4)  Meloxicam 7.5 Mg Tabs (Meloxicam) .Marland Kitchen.. 1 by mouth once daily or two times a day 5)  Furosemide 20 Mg Tabs (Furosemide) .Marland Kitchen.. 1 by mouth once daily 6)  Triamterene-hctz 37.5-25 Mg Caps (Triamterene-hctz) .Marland Kitchen.. 1 by mouth once daily 7)  Ra Fish Oil 1000 Mg Caps (Omega-3 fatty acids) .... Two times a day 8)  Metformin Hcl 500 Mg Tabs (Metformin hcl) .Marland Kitchen.. 1 by mouth qd 9)  Diprolene Af 0.05 % Crea (Betamethasone dipropionate aug) .... Disp 30 g tube apply affected area once daily or two times a day prn 10)  Erythromycin 5 Mg/gm Oint (Erythromycin) .... Instill 1/2 inch in affected eye 4 times a day for 5-7 days dispo: qs   Past History:  Past Medical History: Last updated: 04/23/2007 glucose intolerance a1c 6.1 5/07,  reaction to synvisc,  Rt. Knee mod osteoarthritis and Baker`s cyst 9/07  Past Surgical History: Last updated: 04/23/2007 cardiolite negative ef 70% - 05/26/2005,  carpal tunnel release--r. - 05/27/2003,  c-section - 05/27/2003,  ett equivocal - 05/26/2005, l . breast core bx--normal - 05/27/2003,  mri -r knee--articular cart loss - 06/06/2006,  r. knee steroid injeciton - 06/06/2006  Family History: Last updated: 11/09/2006 dm-mgf 80`s, lung ca--dad  Social History: Last updated: 11/09/2006 Married with twin daughters; Quit smoking in her 79's ; Rare alcohol; Works as Estate agent. Manager

## 2010-12-09 NOTE — Miscellaneous (Signed)
  Clinical Lists Changes  Observations: Added new observation of DM PROGRESS: N/A (11/30/2010 16:27) Added new observation of DM FSREVIEW: N/A (11/30/2010 16:27)      Complete Medication List: 1)  Cozaar 100 Mg Tabs (Losartan potassium) .Marland Kitchen.. 1 by mouth once daily 2)  Metoprolol Succinate 100 Mg Tb24 (Metoprolol succinate) .... 3 tabs qd 3)  Klor-con M20 20 Meq Tbcr (Potassium chloride crys cr) .Marland Kitchen.. 1 by mouth once daily 4)  Meloxicam 7.5 Mg Tabs (Meloxicam) .Marland Kitchen.. 1 by mouth once daily or two times a day 5)  Furosemide 20 Mg Tabs (Furosemide) .Marland Kitchen.. 1 by mouth once daily 6)  Triamterene-hctz 37.5-25 Mg Caps (Triamterene-hctz) .Marland Kitchen.. 1 by mouth once daily 7)  Ra Fish Oil 1000 Mg Caps (Omega-3 fatty acids) .... Two times a day 8)  Metformin Hcl 500 Mg Tabs (Metformin hcl) .Marland Kitchen.. 1 by mouth qd 9)  Diprolene Af 0.05 % Crea (Betamethasone dipropionate aug) .... Disp 30 g tube apply affected area once daily or two times a day prn 10)  Erythromycin 5 Mg/gm Oint (Erythromycin) .... Instill 1/2 inch in affected eye 4 times a day for 5-7 days dispo: qs    Prevention & Chronic Care Immunizations   Influenza vaccine: Fluvax 3+  (07/28/2010)   Influenza vaccine due: 06/09/2009    Tetanus booster: Not documented    Pneumococcal vaccine: Not documented  Colorectal Screening   Hemoccult: Not documented   Hemoccult due: Not Indicated    Colonoscopy: normal  (06/08/2007)   Colonoscopy due: 06/07/2017  Other Screening   Pap smear: Normal  (05/20/2008)   Pap smear due: 05/28/2011    Mammogram: ASSESSMENT: Negative - BI-RADS 1^MM DIGITAL SCREENING  (11/05/2009)   Mammogram due: 11/04/2009   Smoking status: quit  (07/28/2010)  Lipids   Total Cholesterol: 221  (12/09/2009)   LDL: See Comment mg/dL  (12/09/2009)   LDL Direct: Not documented   HDL: 38  (12/09/2009)   Triglycerides: 608  (12/09/2009)   Lipid panel due: 06/26/2009    SGOT (AST): 24  (05/12/2010)   SGPT (ALT): 36   (05/12/2010)   Alkaline phosphatase: 57  (05/12/2010)   Total bilirubin: 0.5  (05/12/2010)  Hypertension   Last Blood Pressure: 122 / 84  (07/28/2010)   Serum creatinine: 0.78  (05/12/2010)   Serum potassium 3.7  ([id number])   Basic metabolic panel due: 99991111  Self-Management Support :    Hypertension self-management support: Written self-care plan  (07/01/2009)    Lipid self-management support: Not documented

## 2011-01-20 ENCOUNTER — Ambulatory Visit (INDEPENDENT_AMBULATORY_CARE_PROVIDER_SITE_OTHER): Payer: 59 | Admitting: Family Medicine

## 2011-01-20 DIAGNOSIS — R6889 Other general symptoms and signs: Secondary | ICD-10-CM | POA: Insufficient documentation

## 2011-01-20 DIAGNOSIS — I10 Essential (primary) hypertension: Secondary | ICD-10-CM

## 2011-01-20 DIAGNOSIS — R21 Rash and other nonspecific skin eruption: Secondary | ICD-10-CM

## 2011-01-20 MED ORDER — BETAMETHASONE DIPROPIONATE AUG 0.05 % EX CREA: TOPICAL_CREAM | CUTANEOUS | Status: DC

## 2011-01-20 NOTE — Progress Notes (Signed)
  Subjective:    Patient ID: Pamela Thomas, female    DOB: [birth year], 55 y.o.   MRN: [id number]  HPI  [name] has been working hard on getting herself back into shape since January of this year. She has hard a Physiological scientist. She has lost 10-15 pounds already. She feels much better. Her trainer noticed that her pulse rate seemed to drop residue that arise during intense exercise. She also feels a little bit more short of breath during exercise than she thinks she should. Overall however she is feeling much more energetic, sleeping better, and feeling better about herself since she started the exercise program. She is working out 3-5 days a week with a Physiological scientist. She avoids running because of her knees. Her most significant episode of shortness of breath was last week when she was doing some walking relays involving carrying some weights. Her pulse initially went up but then dropped and she doesn't really know what the numbers of the pulses were. She's had no chest pains throughout this period  #2: Several years ago she had a rash on the backs of her thighs. She thinks it was in the summer. That same rash has come back again. The cream I gave her last time seemed to totally resolve it. The rash is nowhere else. Review of Systems Some intentional weight loss. No fevers sweats or chills.    Objective:   Physical Exam    general well-developed female no acute distress  cardiovascular regular rate and rhythm 2/6 murmur  lungs: Clear to auscultation bilaterally Skin: Slightly erythematous plaques on the posterior portions of her thighs consistent with psoriasis.    Assessment & Plan:  #1 colon exercise intolerance. I think this deserves exercise treadmill test we have set that up. She has previously had a Myoview. I am not worried that this is cardiac ischemia, I think it is more a case of beta-blockade in the setting of some general deconditioning. I thought her efforts to get back in  shape. During her exercise treadmill we will try to find some information that will help her in her trainer. #2: Hypertension that is not particularly well controlled today. She is quite anxious today so that may be part of the issue. We'll address this I see her for her treadmill and get the information from that. At this time I will make no changes. #3: Rash. This looks like psoriasis. I would recommend that same last time it responded well to some Diprolene ointment to we will try that again.

## 2011-01-25 ENCOUNTER — Telehealth: Payer: Self-pay | Admitting: Family Medicine

## 2011-01-25 ENCOUNTER — Encounter: Payer: Self-pay | Admitting: Family Medicine

## 2011-01-25 ENCOUNTER — Other Ambulatory Visit: Payer: Self-pay | Admitting: Family Medicine

## 2011-01-25 ENCOUNTER — Ambulatory Visit (HOSPITAL_COMMUNITY)
Admission: RE | Admit: 2011-01-25 | Discharge: 2011-01-25 | Disposition: A | Discharge status: Home / Self Care | Payer: 59 | Source: Ambulatory Visit | Attending: Family Medicine | Admitting: Family Medicine

## 2011-01-25 DIAGNOSIS — R079 Chest pain, unspecified: Secondary | ICD-10-CM | POA: Insufficient documentation

## 2011-01-25 DIAGNOSIS — R9430 Abnormal result of cardiovascular function study, unspecified: Secondary | ICD-10-CM

## 2011-01-25 NOTE — Telephone Encounter (Signed)
Pt informed of appt with [facility] [address] for 5.25.2012 @ 1130 am with Dr. Johnsie Cancel. Pt agreed.Pamela Thomas

## 2011-01-25 NOTE — Progress Notes (Signed)
EXERCISE TREADMILL TEST [facility] Patients beta blocker was held for 24 hours prior to test as instructed Resting EKG sinus tach with rate 105 and frequent PVC.  Target Heart Rate 140 was achieved and exceeded Maximum hear rate achieved 167. Pretest probability: 40% Post test probability: 60% Signs of ischemia by EKG criteria: Equivocal  with 1 mm ST depression in V5, V6. Non specific ST changes (? Elevation) V1. Total METS achieved 7 Hypertensive BP response with moderately poor BP  Recovery Frequent PVC in rest and recovery, resolved with exertion.  ASSESSMENT: abnormal ETT. Similar changes were noted on her ETT of 2006. PLAN: Refer to cardiology. In the interim I have asked her to decrease the intensity of her exercise.

## 2011-01-31 ENCOUNTER — Encounter: Payer: Self-pay | Admitting: Cardiovascular Disease

## 2011-02-04 ENCOUNTER — Encounter: Payer: Self-pay | Admitting: Cardiovascular Disease

## 2011-02-04 ENCOUNTER — Ambulatory Visit (INDEPENDENT_AMBULATORY_CARE_PROVIDER_SITE_OTHER): Payer: 59 | Admitting: Cardiovascular Disease

## 2011-02-04 DIAGNOSIS — R0600 Dyspnea, unspecified: Secondary | ICD-10-CM

## 2011-02-04 DIAGNOSIS — R6889 Other general symptoms and signs: Secondary | ICD-10-CM

## 2011-02-04 DIAGNOSIS — I10 Essential (primary) hypertension: Secondary | ICD-10-CM

## 2011-02-04 DIAGNOSIS — E785 Hyperlipidemia, unspecified: Secondary | ICD-10-CM

## 2011-02-04 DIAGNOSIS — R011 Cardiac murmur, unspecified: Secondary | ICD-10-CM

## 2011-02-04 MED ORDER — METOPROLOL SUCCINATE ER 100 MG PO TB24: 100.0000 mg | ORAL_TABLET | Freq: Every day | ORAL | Status: DC

## 2011-02-04 NOTE — Assessment & Plan Note (Signed)
Well controlled.  Continue current medications and low sodium Dash type diet.    

## 2011-02-04 NOTE — Assessment & Plan Note (Signed)
Benign SEM  With dyspnea and exercise issues check echo

## 2011-02-04 NOTE — Assessment & Plan Note (Signed)
Cholesterol is at goal.  Continue current dose of statin and diet Rx.  No myalgias or side effects.  F/U  LFT's in 6 months. Lab Results  Component Value Date   LDLCALC See Comment mg/dL 12/09/2009

## 2011-02-04 NOTE — Patient Instructions (Signed)
Your physician recommends that you schedule a follow-up appointment in: 3-4 weeks with Dr. Johnsie Cancel  Your physician has recommended you make the following change in your medication: DECREASE METOPROLOL to 100 mg daily.  Your physician has requested that you have an echocardiogram. Echocardiography is a painless test that uses sound waves to create images of your heart. It provides your doctor with information about the size and shape of your heart and how well your heart's chambers and valves are working. This procedure takes approximately one hour. There are no restrictions for this procedure.

## 2011-02-04 NOTE — Assessment & Plan Note (Signed)
Related to obesity and beta blocker.  Echo to R/O structural heart disease.  Encouraged her to F/U with Dr Nori Riis to order sleep study

## 2011-02-04 NOTE — Progress Notes (Signed)
55 yo obese, HTN, edema referred by Dr Nori Riis for bradycardia, fatigue and dyspnea.  On very large dose of Toprol 300mg .  No documented heart disease.  Working out at [employer] and lost 10 lbs in the last couple of months.  ? Of HR not getting high enough when she works out.  Reviewed ETT she had 5/15.  Exercised 6:23 to 7.5 METS.  BP response flat but not hypotensive.  Held BB two days before study and max HR 167.  No ischemic changes.  She does get some positional vertigo especially at night when she lays her head on her pillow.  NO SSCP.  Likely needs sleep study to R/O obesity hypoventilation syndrome.  She needs to be on less beta blocker.  Will decrease to 100mg  and F/U echo to R/O structural heart disease  ROS: Denies fever, malais, weight loss, blurry vision, decreased visual acuity, cough, sputum, SOB, hemoptysis, pleuritic pain, palpitaitons, heartburn, abdominal pain, melena, lower extremity edema, claudication, or rash.   General: Affect appropriate Healthy:  appears stated age 55: normal Neck supple with no adenopathy JVP normal no bruits no thyromegaly Lungs clear with no wheezing and good diaphragmatic motion Heart:  S1/S2 SEM  Murmur, no rub, gallop or click PMI normal Abdomen: benighn, BS positve, no tenderness, no AAA no bruit.  No HSM or HJR Distal pulses intact with no bruits Plus one bilateral  edema Neuro non-focal Skin warm and dry No muscular weakness  Medications Current Outpatient Prescriptions  Medication Sig Dispense Refill  . aspirin 81 MG tablet Take 81 mg by mouth daily.        . Cholecalciferol (VITAMIN D-3 PO) Take by mouth daily.        Marland Kitchen losartan (COZAAR) 100 MG tablet TAKE 1 TABLET BY MOUTH EVERY DAY  90 tablet  3  . meloxicam (MOBIC) 7.5 MG tablet Take 7.5 mg by mouth as directed. 1 by mouth daily or two times a day       . metFORMIN (GLUCOPHAGE) 500 MG tablet Take 500 mg by mouth daily with breakfast.        . metoprolol (TOPROL-XL) 100 MG 24 hr tablet  TAKE 3 TABLETS BY MOUTH EVERY DAY  270 tablet  3  . potassium chloride SA (KLOR-CON M20) 20 MEQ tablet Take 20 mEq by mouth daily.        Marland Kitchen triamterene-hydrochlorothiazide (DYAZIDE) 37.5-25 MG per capsule Take 1 capsule by mouth daily.        Marland Kitchen DISCONTD: furosemide (LASIX) 20 MG tablet TAKE 1 TABLET BY MOUTH EVERY DAY  90 tablet  3  . DISCONTD: augmented betamethasone dipropionate (DIPROLENE AF) 0.05 % cream Apply bid / tid to affected area excluding face  50 g  2  . DISCONTD: erythromycin [facility]) ophthalmic ointment as directed. Instill 1/2 inch in affected eye 4 times a day for 5-7 days       . DISCONTD: Omega-3 Fatty Acids (RA FISH OIL) 1000 MG CAPS Take 2 capsules by mouth 2 (two) times daily.          Allergies Review of patient's allergies indicates no known allergies.  Family History: Family History  Problem Relation Age of Onset  . Diabetes    . Lung cancer      Social History: History   Social History  . Marital Status: Married    Spouse Name: N/A    Number of Children: 2  . Years of Education: N/A   Occupational History  . admin  manager    Social History Main Topics  . Smoking status: Former Smoker    Quit date: 09/12/1990  . Smokeless tobacco: Not on file  . Alcohol Use: Yes     rare  . Drug Use: Not on file  . Sexually Active: Not on file   Other Topics Concern  . Not on file   Social History Narrative  . No narrative on file    Electrocardiogram:  NSR 69 Normal ECG  Assessment and Plan

## 2011-02-16 ENCOUNTER — Ambulatory Visit (HOSPITAL_COMMUNITY): Payer: 59 | Attending: Cardiovascular Disease | Admitting: Radiology

## 2011-02-16 DIAGNOSIS — R0989 Other specified symptoms and signs involving the circulatory and respiratory systems: Secondary | ICD-10-CM | POA: Insufficient documentation

## 2011-02-16 DIAGNOSIS — R0609 Other forms of dyspnea: Secondary | ICD-10-CM | POA: Insufficient documentation

## 2011-02-16 DIAGNOSIS — R011 Cardiac murmur, unspecified: Secondary | ICD-10-CM

## 2011-02-16 DIAGNOSIS — E785 Hyperlipidemia, unspecified: Secondary | ICD-10-CM | POA: Insufficient documentation

## 2011-02-16 DIAGNOSIS — I379 Nonrheumatic pulmonary valve disorder, unspecified: Secondary | ICD-10-CM | POA: Insufficient documentation

## 2011-02-16 DIAGNOSIS — I359 Nonrheumatic aortic valve disorder, unspecified: Secondary | ICD-10-CM | POA: Insufficient documentation

## 2011-02-16 DIAGNOSIS — R0600 Dyspnea, unspecified: Secondary | ICD-10-CM

## 2011-02-16 DIAGNOSIS — I10 Essential (primary) hypertension: Secondary | ICD-10-CM | POA: Insufficient documentation

## 2011-02-22 ENCOUNTER — Ambulatory Visit (INDEPENDENT_AMBULATORY_CARE_PROVIDER_SITE_OTHER): Payer: 59 | Admitting: Cardiovascular Disease

## 2011-02-22 ENCOUNTER — Encounter: Payer: Self-pay | Admitting: Family Medicine

## 2011-02-22 ENCOUNTER — Encounter: Payer: Self-pay | Admitting: Cardiovascular Disease

## 2011-02-22 DIAGNOSIS — R011 Cardiac murmur, unspecified: Secondary | ICD-10-CM

## 2011-02-22 DIAGNOSIS — I10 Essential (primary) hypertension: Secondary | ICD-10-CM

## 2011-02-22 DIAGNOSIS — E785 Hyperlipidemia, unspecified: Secondary | ICD-10-CM

## 2011-02-22 DIAGNOSIS — R6889 Other general symptoms and signs: Secondary | ICD-10-CM

## 2011-02-22 NOTE — Assessment & Plan Note (Signed)
Cholesterol is at goal.  Continue current dose of statin and diet Rx.  No myalgias or side effects.  F/U  LFT's in 6 months. Lab Results  Component Value Date   LDLCALC See Comment mg/dL 12/09/2009

## 2011-02-22 NOTE — Assessment & Plan Note (Signed)
Improved likely related to too high a dose of Toprol and obesity hypoventilation syndrome.  Continue weight loss and exercise program.  Improved on current dose of Toprol

## 2011-02-22 NOTE — Assessment & Plan Note (Signed)
Well controlled.  Continue current medications and low sodium Dash type diet.    

## 2011-02-22 NOTE — Progress Notes (Signed)
55 yo obese, HTN, edema referred by Dr Nori Riis for bradycardia, fatigue and dyspnea. On very large dose of Toprol 300mg . No documented heart disease. Working out at [employer] and lost 10 lbs in the last couple of months. ? Of HR not getting high enough when she works out. Reviewed ETT she had 5/15. Exercised 6:23 to 7.5 METS. BP response flat but not hypotensive. Held BB two days before study and max HR 167. No ischemic changes. She does get some positional vertigo especially at night when she lays her head on her pillow. NO SSCP. Likely needs sleep study to R/O obesity hypoventilation syndrome.  She feels much better on lower dose Toprol  Tried 100mg  but felt panicky.  At 200mg  improved with HR 140's with exercise which is ideal  - Left ventricle: The cavity size was normal. Wall thickness was normal. Systolic function was normal. The estimated ejection fraction was in the range of 55% to 60%. Wall motion was normal; there were no regional wall motion abnormalities. Doppler parameters are consistent with abnormal left ventricular relaxation (grade 1 diastolic dysfunction). - Aortic valve: Mild regurgitation. - Pulmonary arteries: Systolic pressure was mildly increased. PA peak pressure: 44mm Hg (S).   ROS: Denies fever, malais, weight loss, blurry vision, decreased visual acuity, cough, sputum, SOB, hemoptysis, pleuritic pain, palpitaitons, heartburn, abdominal pain, melena, lower extremity edema, claudication, or rash.  All other systems reviewed and negative  General: Affect appropriate Healthy:  appears stated age 55: normal Neck supple with no adenopathy JVP normal no bruits no thyromegaly Lungs clear with no wheezing and good diaphragmatic motion Heart:  S1/S2 no murmur,rub, gallop or click PMI normal Abdomen: benighn, BS positve, no tenderness, no AAA no bruit.  No HSM or HJR Distal pulses intact with no bruits No edema Neuro non-focal Skin warm and dry No muscular  weakness   Current Outpatient Prescriptions  Medication Sig Dispense Refill  . aspirin 81 MG tablet Take 81 mg by mouth daily.        . Cholecalciferol (VITAMIN D-3 PO) Take by mouth daily.        Marland Kitchen losartan (COZAAR) 100 MG tablet TAKE 1 TABLET BY MOUTH EVERY DAY  90 tablet  3  . meloxicam (MOBIC) 7.5 MG tablet Take 7.5 mg by mouth as directed. 1 by mouth daily or two times a day       . metFORMIN (GLUCOPHAGE) 500 MG tablet Take 500 mg by mouth daily with breakfast.        . metoprolol (TOPROL-XL) 100 MG 24 hr tablet Take 1 tablet (100 mg total) by mouth daily.  30 tablet  6  . potassium chloride SA (KLOR-CON M20) 20 MEQ tablet Take 20 mEq by mouth daily.        Marland Kitchen triamterene-hydrochlorothiazide (DYAZIDE) 37.5-25 MG per capsule Take 1 capsule by mouth daily.          Allergies  Review of patient's allergies indicates no known allergies.  Electrocardiogram:  Assessment and Plan

## 2011-02-22 NOTE — Assessment & Plan Note (Signed)
Benign SEM, mild AR on echo  F/U echo in a year

## 2011-02-23 ENCOUNTER — Telehealth: Payer: Self-pay | Admitting: Cardiovascular Disease

## 2011-02-23 ENCOUNTER — Encounter: Payer: Self-pay | Admitting: Family Medicine

## 2011-02-23 NOTE — Telephone Encounter (Signed)
Pt rtn your call pls contact her at number listed above/lg

## 2011-02-23 NOTE — Telephone Encounter (Signed)
Returning call to speak christine.

## 2011-02-23 NOTE — Telephone Encounter (Signed)
PT AWARE OF ECHO RESULTS./CY 

## 2011-02-23 NOTE — Progress Notes (Signed)
PT AWARE OF ECHO RESULTS./CY 

## 2011-04-22 ENCOUNTER — Encounter: Payer: Self-pay | Admitting: Family Medicine

## 2011-04-22 ENCOUNTER — Other Ambulatory Visit: Payer: Self-pay | Admitting: Family Medicine

## 2011-04-22 ENCOUNTER — Ambulatory Visit (INDEPENDENT_AMBULATORY_CARE_PROVIDER_SITE_OTHER): Payer: 59 | Admitting: Family Medicine

## 2011-04-22 DIAGNOSIS — R358 Other polyuria: Secondary | ICD-10-CM

## 2011-04-22 DIAGNOSIS — R390 Extravasation of urine: Secondary | ICD-10-CM

## 2011-04-22 DIAGNOSIS — R42 Dizziness and giddiness: Secondary | ICD-10-CM

## 2011-04-22 DIAGNOSIS — R3589 Other polyuria: Secondary | ICD-10-CM

## 2011-04-22 LAB — POCT URINALYSIS DIPSTICK
Bilirubin, UA: NEGATIVE
Blood, UA: NEGATIVE
Glucose, UA: NEGATIVE
Ketones, UA: NEGATIVE
Protein, UA: NEGATIVE
Spec Grav, UA: 1.015
pH, UA: 7

## 2011-04-22 LAB — POCT UA - MICROSCOPIC ONLY

## 2011-04-22 NOTE — Assessment & Plan Note (Signed)
Mild orthostatic hypotension.  Advised extra fluids. Will check u/a and culture since having some urinary symptoms.  To follow up if not improved

## 2011-04-22 NOTE — Patient Instructions (Signed)
I will call you if your urine looks worrisome.    Please call and come back if this is getting worse  Drink plenty of fluids for the next few days.  Account for the heat when you think of hydration

## 2011-04-22 NOTE — Progress Notes (Signed)
  Subjective:    Patient ID: Pamela Thomas, female    DOB: 15-Jan-1956, 55 y.o.   MRN: [id number]  HPI Was doing some floor stretchign exercises on wed night with trainer.  Turned head and the room spun.  This is normal for her but the spinning lasted longer than usual.  Also, when she stood up she had some blacking in her vision but no LOC.  She has been feeling fine except for some urgency and frequency over the last few days with urination   Review of Systems    Denies CP, SOB, HA, N/V/D, fever  Objective:   Physical Exam  Vital signs reviewed General appearance - alert, well appearing, and in no distress and oriented to person, place, and time Heart - normal rate, regular rhythm, normal S1, S2, no murmurs, rubs, clicks or gallops Chest - clear to auscultation, no wheezes, rales or rhonchi, symmetric air entry, no tachypnea, retractions or cyanosis Abdomen - soft, nontender, nondistended, no masses or organomegaly       Assessment & Plan:  Dizzy Mild orthostatic hypotension.  Advised extra fluids. Will check u/a and culture since having some urinary symptoms.  To follow up if not improved

## 2011-04-24 LAB — URINE CULTURE

## 2011-05-02 ENCOUNTER — Other Ambulatory Visit: Payer: Self-pay | Admitting: Family Medicine

## 2011-05-02 MED ORDER — TRIAMTERENE-HCTZ 37.5-25 MG PO CAPS: 1.0000 | ORAL_CAPSULE | Freq: Every day | ORAL | Status: DC

## 2011-05-02 NOTE — Telephone Encounter (Signed)
Needs refill on triamterene-hydrochlorothiazide  [facility] to [facility] [address].  [facility]

## 2011-05-02 NOTE — Telephone Encounter (Signed)
Dr. Nori Riis, I didn't see a request up front or in your box. Do you have this request already or can we send in this electronically or should I call and have them fax over request while I wait for it. ----Clarise Cruz

## 2011-05-03 ENCOUNTER — Other Ambulatory Visit: Payer: Self-pay | Admitting: Family Medicine

## 2011-05-04 MED ORDER — TRIAMTERENE-HCTZ 37.5-25 MG PO CAPS: 1.0000 | ORAL_CAPSULE | Freq: Every day | ORAL | Status: DC

## 2011-05-04 NOTE — Telephone Encounter (Signed)
Addended byDorcas Mcmurray L on: 05/04/2011 09:00 AM   Modules accepted: Orders

## 2011-05-04 NOTE — Telephone Encounter (Signed)
I sent this already--I will send again now Pamela Thomas

## 2011-05-11 ENCOUNTER — Encounter: Payer: Self-pay | Admitting: Family Medicine

## 2011-05-11 ENCOUNTER — Ambulatory Visit (INDEPENDENT_AMBULATORY_CARE_PROVIDER_SITE_OTHER): Payer: 59 | Admitting: Family Medicine

## 2011-05-11 DIAGNOSIS — I10 Essential (primary) hypertension: Secondary | ICD-10-CM

## 2011-05-11 DIAGNOSIS — R7309 Other abnormal glucose: Secondary | ICD-10-CM

## 2011-05-11 DIAGNOSIS — E785 Hyperlipidemia, unspecified: Secondary | ICD-10-CM

## 2011-05-11 DIAGNOSIS — R42 Dizziness and giddiness: Secondary | ICD-10-CM | POA: Insufficient documentation

## 2011-05-11 LAB — COMPREHENSIVE METABOLIC PANEL
Albumin: 4.5 g/dL (ref 3.5–5.2)
BUN: 18 mg/dL (ref 6–23)
CO2: 24 mEq/L (ref 19–32)
Calcium: 10 mg/dL (ref 8.4–10.5)
Glucose, Bld: 123 mg/dL — ABNORMAL HIGH (ref 70–99)
Potassium: 3.8 mEq/L (ref 3.5–5.3)
Sodium: 138 mEq/L (ref 135–145)
Total Protein: 7.2 g/dL (ref 6.0–8.3)

## 2011-05-11 LAB — LIPID PANEL
Cholesterol: 220 mg/dL — ABNORMAL HIGH (ref 0–200)
Triglycerides: 576 mg/dL — ABNORMAL HIGH (ref <150)

## 2011-05-11 LAB — CBC
HCT: 42.6 % (ref 36.0–46.0)
Hemoglobin: 13.8 g/dL (ref 12.0–15.0)
MCH: 28.7 pg (ref 26.0–34.0)
MCHC: 32.4 g/dL (ref 30.0–36.0)
MCV: 88.6 fL (ref 78.0–100.0)

## 2011-05-11 NOTE — Patient Instructions (Addendum)
I will send you a letter about your labs. We are looking at lipids, blood sugar both long term and fasting, electrolytes (potassium) kidney and lover function, hemoglobin  I will see you for your pap smear at your convenience--I would definitely like to see you in 4-6 months and we could do it then. CONGRATULATIONS on continuing with the new life style and exercise. My nurse will call you in the next 2 days re your appt wit the ENT. If you have any more dizzy episodes in the mean time, let me know.  Your mammogram was normal in March of this year. Your colonoscopy was in 2008 and due in 2018

## 2011-05-11 NOTE — Progress Notes (Signed)
Subjective:    Patient ID: Pamela Thomas, female    DOB: [birth year], 55 y.o.   MRN: [id number]  HPI  Here for physical exam and yearly checkup. Just found out on the way over here that her mom had been taken to the hospital so she would actually like to put her Pap smear off to a separate visit. Has several issues.  #1. Episode of dizziness while stretching. Was seen here by another doctor. They thought it was potentially dehydration. She doesn't think so. Was lying on her left side and stretched her right arm back behind her. As her head rotated to the right she had acute onset of room spinning. Her personal trainer said she had "crazy" eyes. She had a near syncopal event right after that when she got back up on her feet. Has had problems with dizziness off and on for many years. We had previously set her up to see an ENT physician but then her symptoms got better so she canceled  #2. Hypertension she was seen by the cardiologist and had several tests which were reportedly normal. He did change her blood pressure medicine around a little bit. She feels better since going to do of the temporal tablets daily. She had been on 3 a day.   #3. Weight :She continues to try to work out or lose weight. Overall she's lost about 25-30 pounds. She is doing weights and cardiovascular exercise 4 days a week stretching t days a week. She is trying to eat well. Since she changed her blood pressure medicine she is having no episodes of low blood pressure or low pulse while exercising. In general she feels better since the change in her medicines.   Review of Systems    has had some weight loss that is expected. Denies abdominal pain, joint pains, fever, chills. Denies chest pains at rest and exertion. Denies shortness of breath with exertion. Complete review of systems is otherwise negative except as in history of present illness Objective:   Physical Exam GENERALl: Well developed, well nourished, in no  acute distress. NECK: Supple, FROM, without lymphadenopathy.  THYROID: normal without nodularity CAROTID ARTERIES: without bruits LUNGS: clear to auscultation bilaterally. No wheezes or rales. HEART: Regular rate and rhythm, no murmurs ABDOMEN: soft with positive bowel sounds NEURO: No gross focal deficits SKIN : Complete skin exam reveals multiple nevi and freckles and a lot of long-term sun damage but no worrisome lesions. BREAST : Bilaterally symmetrical without any skin changes, normal nipple and Arriola. No worrisome masses. GU: Deferred to next visit PSYCH: Alert and oriented x4. Interactive affect. Asks and answers questions properly. Normal thought process.        Assessment & Plan:  #1. Well woman checkup. Will do her Pap smear at next visit. Her mammogram is in date. Her colonoscopy is in date #2. Vertigo. This certainly sounds like BPPV. I will set her back up for that ENT. Like for them to rule out anything else. Potentially she could benefit from a physical therapy for BPPV. #3. Hypertension. Slightly above ideal today but she is quite stressed about her mom. I will not making medication changes. She will take some blood pressure readings and send them to me and I will see her back in the next couple of months For her Pap smear. #4 history of elevated glucose: We'll check A1c today. She continues on metformin. She would like to get off that at all possible. We talked about how that may  actually help her somewhat  with weight loss. For now she will continue it.

## 2011-05-18 ENCOUNTER — Telehealth: Payer: Self-pay | Admitting: Family Medicine

## 2011-05-18 NOTE — Telephone Encounter (Signed)
lvm to inform pt of appt on 9.10.2012 @ 215 pm with Dr. Constance Holster at [facility] [address]. If pt cannot keep appt she will need to call their office at [phone] to cancel/reschedule.Marland Kitchen Marland KitchenMaryruth Eve, [place] Crocker

## 2011-05-19 NOTE — Telephone Encounter (Signed)
Patient informed of appointment

## 2011-05-23 ENCOUNTER — Encounter: Payer: Self-pay | Admitting: Family Medicine

## 2011-06-07 ENCOUNTER — Other Ambulatory Visit: Payer: Self-pay | Admitting: Family Medicine

## 2011-06-15 ENCOUNTER — Other Ambulatory Visit (HOSPITAL_COMMUNITY)
Admission: RE | Admit: 2011-06-15 | Discharge: 2011-06-15 | Disposition: A | Discharge status: Home / Self Care | Payer: 59 | Source: Ambulatory Visit | Attending: Family Medicine | Admitting: Family Medicine

## 2011-06-15 ENCOUNTER — Telehealth: Payer: Self-pay | Admitting: Family Medicine

## 2011-06-15 ENCOUNTER — Ambulatory Visit (INDEPENDENT_AMBULATORY_CARE_PROVIDER_SITE_OTHER): Payer: 59 | Admitting: Family Medicine

## 2011-06-15 ENCOUNTER — Encounter: Payer: Self-pay | Admitting: Family Medicine

## 2011-06-15 VITALS — BP 139/81 | HR 89 | Temp 98.9°F | Ht 62.0 in | Wt 237.0 lb

## 2011-06-15 DIAGNOSIS — Z124 Encounter for screening for malignant neoplasm of cervix: Secondary | ICD-10-CM

## 2011-06-15 DIAGNOSIS — H8309 Labyrinthitis, unspecified ear: Secondary | ICD-10-CM

## 2011-06-15 DIAGNOSIS — Z23 Encounter for immunization: Secondary | ICD-10-CM

## 2011-06-15 DIAGNOSIS — Z01419 Encounter for gynecological examination (general) (routine) without abnormal findings: Secondary | ICD-10-CM | POA: Insufficient documentation

## 2011-06-15 DIAGNOSIS — Z808 Family history of malignant neoplasm of other organs or systems: Secondary | ICD-10-CM

## 2011-06-15 MED ORDER — ESTRADIOL 25 MCG VA TABS: 25.0000 ug | ORAL_TABLET | Freq: Every day | VAGINAL | Status: DC

## 2011-06-15 MED ORDER — ESTRADIOL 10 MCG VA TABS: ORAL_TABLET | VAGINAL | Status: DC

## 2011-06-15 MED ORDER — METFORMIN HCL 500 MG PO TABS: 500.0000 mg | ORAL_TABLET | Freq: Every day | ORAL | Status: DC

## 2011-06-15 MED ORDER — TETANUS-DIPHTH-ACELL PERTUSSIS 5-2.5-18.5 LF-MCG/0.5 IM SUSP: 0.5000 mL | Freq: Once | INTRAMUSCULAR | Status: DC

## 2011-06-15 NOTE — Progress Notes (Signed)
  Subjective:    Patient ID: Pamela Thomas, female    DOB: 1956-01-31, 55 y.o.   MRN: [id number]  HPI  1. Here for completion of CPE ----needs her pap  2. Increased stressors--her brother just dx w metastatic melanoma--she is worried that some of her sx of forgetfulness, fatigue. sleepiness etc are similar to what he has had.  3. Dizziness has not returned--she is having more tinnitus on her left ear. Saw ENT  Review of Systems    Pertinent review of systems: negative for fever or unusual weight change. Does have some vaginal dryness.  Objective:   Physical Exam   GENERAL: Well developed, well nourished, no acute distress GU externally atrophic--normal cervix appearance No adnexal masses on bimanual although exam limited some by habitus.      Assessment & Plan:  1. Preventive measures Pap today 2. Concern with new dx of FH melanoma--we discussed... Greater than 50% of our 45 minute office visit was spent in counseling and education regarding these issues. Will set up with dermatology for complete skin exam / ongoing surveillance.  3. vagifem for external GU atrophy. 4. Dizziness--has not had any other episodes--ENT was not able to reproduce sx. She is very cncerned this cold be early sign of metastatic disease as she has Fh vaious cancers, most recently her brother dx w melanoma. I did offer MRi to further eval her dizziness---she is thinking about that.

## 2011-06-15 NOTE — Patient Instructions (Addendum)
My nurse will call you regarding an appointment with dermatology. If you have not heard from [name] in a week, please let me know. Our system is still not full proof.  If you decide to proceed with the MRI, please call and let me know. I will not need to see you to set that up.   I will call in some vaginal estrogen if you wish

## 2011-06-15 NOTE — Telephone Encounter (Signed)
Appointment set for 09/29/2011 at 9:45am with Dr. Fontaine No @ [facility]. [place]. [phone]. Patient notified of this appointment and agreed to it, she was also informed that they will send her out a new patient packet 2-3 weeks before visit to be filled out before visit. Patient informed to call 24-48 hours if unable to make appointment.Pamela Thomas

## 2011-06-15 NOTE — Telephone Encounter (Signed)
Dear Pamela Thomas Team Please schedule her for dermatology referral order is in  Routine--no urgency

## 2011-06-21 ENCOUNTER — Encounter: Payer: Self-pay | Admitting: Family Medicine

## 2011-08-02 ENCOUNTER — Other Ambulatory Visit: Payer: Self-pay | Admitting: Family Medicine

## 2011-08-02 NOTE — Telephone Encounter (Signed)
Refill request

## 2011-10-13 ENCOUNTER — Other Ambulatory Visit: Payer: Self-pay | Admitting: Family Medicine

## 2011-10-13 NOTE — Telephone Encounter (Signed)
Refill request

## 2011-10-28 ENCOUNTER — Other Ambulatory Visit: Payer: Self-pay | Admitting: Family Medicine

## 2011-10-31 ENCOUNTER — Other Ambulatory Visit: Payer: Self-pay | Admitting: Family Medicine

## 2011-10-31 NOTE — Telephone Encounter (Signed)
Refill request

## 2011-12-01 ENCOUNTER — Other Ambulatory Visit: Payer: Self-pay | Admitting: Family Medicine

## 2011-12-21 ENCOUNTER — Encounter: Payer: Self-pay | Admitting: Family Medicine

## 2011-12-21 ENCOUNTER — Ambulatory Visit (INDEPENDENT_AMBULATORY_CARE_PROVIDER_SITE_OTHER): Payer: 59 | Admitting: Family Medicine

## 2011-12-21 VITALS — BP 136/72 | HR 76 | Temp 99.5°F | Ht 61.0 in | Wt 235.0 lb

## 2011-12-21 DIAGNOSIS — R300 Dysuria: Secondary | ICD-10-CM

## 2011-12-21 DIAGNOSIS — R109 Unspecified abdominal pain: Secondary | ICD-10-CM

## 2011-12-21 DIAGNOSIS — K6289 Other specified diseases of anus and rectum: Secondary | ICD-10-CM

## 2011-12-21 DIAGNOSIS — R1032 Left lower quadrant pain: Secondary | ICD-10-CM

## 2011-12-21 DIAGNOSIS — R1031 Right lower quadrant pain: Secondary | ICD-10-CM

## 2011-12-21 DIAGNOSIS — R509 Fever, unspecified: Secondary | ICD-10-CM

## 2011-12-21 LAB — POCT URINALYSIS DIPSTICK
Bilirubin, UA: NEGATIVE
Glucose, UA: NEGATIVE
Ketones, UA: NEGATIVE
Nitrite, UA: NEGATIVE
pH, UA: 7

## 2011-12-21 LAB — CBC WITH DIFFERENTIAL/PLATELET
Basophils Absolute: 0 10*3/uL (ref 0.0–0.1)
Basophils Relative: 0 % (ref 0–1)
Hemoglobin: 14 g/dL (ref 12.0–15.0)
MCHC: 33.3 g/dL (ref 30.0–36.0)
Monocytes Relative: 8 % (ref 3–12)
Neutro Abs: 7 10*3/uL (ref 1.7–7.7)
Neutrophils Relative %: 71 % (ref 43–77)
RDW: 16 % — ABNORMAL HIGH (ref 11.5–15.5)

## 2011-12-21 LAB — COMPREHENSIVE METABOLIC PANEL
ALT: 14 U/L (ref 0–35)
AST: 18 U/L (ref 0–37)
Albumin: 4.3 g/dL (ref 3.5–5.2)
Alkaline Phosphatase: 66 U/L (ref 39–117)
Potassium: 3.6 mEq/L (ref 3.5–5.3)
Sodium: 136 mEq/L (ref 135–145)
Total Protein: 7.2 g/dL (ref 6.0–8.3)

## 2011-12-21 LAB — POCT UA - MICROSCOPIC ONLY

## 2011-12-21 MED ORDER — METRONIDAZOLE 500 MG PO TABS: 500.0000 mg | ORAL_TABLET | Freq: Three times a day (TID) | ORAL | Status: AC

## 2011-12-21 MED ORDER — CIPROFLOXACIN HCL 250 MG PO TABS: 500.0000 mg | ORAL_TABLET | Freq: Two times a day (BID) | ORAL | Status: AC

## 2011-12-21 MED ORDER — HYDROCORTISONE ACETATE 25 MG RE SUPP: 25.0000 mg | Freq: Two times a day (BID) | RECTAL | Status: AC

## 2011-12-21 NOTE — Patient Instructions (Signed)
We will get some blood work and urine culture. I am starting you on two antibiotics as well as some rectal suppositories. You should improve in next 24-48 hours and hopefully resolve in next few days. If you do not or you have new or worsening symptoms, please call me at the office.[phone]

## 2011-12-22 ENCOUNTER — Encounter: Payer: Self-pay | Admitting: Family Medicine

## 2011-12-23 NOTE — Progress Notes (Signed)
  Subjective:    Patient ID: Pamela Thomas, female    DOB: 07-Aug-1956, 56 y.o.   MRN: [id number]  HPI  T3 day history of just not feeling right. She's had some pressure sensation in her bladder and lower abdomen. She's had some rectal pain with defecation but has seen no blood. She's had pressure sensation on her bladder as well as increased frequency of urination. She has not had fever but has had fatigue. Denies shortness of breath. Denies chest pain. Has noted no lower extremity edema.  Review of Systems Please see history of present illness above for pertinent review of systems    Objective:   Physical Exam  Vital signs reviewed GENERALl: Well developed, well nourished, in no acute distress. Overweight. NECK: Supple, FROM, without lymphadenopathy.  THYROID: normal without nodularity CAROTID ARTERIES: without bruits LUNGS: clear to auscultation bilaterally. No wheezes or rales. HEART: Regular rate and rhythm, no murmurs ABDOMEN: soft with positive bowel sounds. Mildly tender over the suprapubic area. There is no rebound noted. The flanks and this costovertebral angles are nontender to percussion. RECTAL: Very mild erythema noted. I see no external hemorrhoids and I feel no internal hemorrhoids on rectal exam. There is some pink tinged stool in the rectum. Rectal exam is extremely painful. I feel no fissure. NEURO: No gross focal deficits        Assessment & Plan:  #1. Rectal pain with some bleeding #2. Abdominal discomfort #3. Bladder discomfort Plan: And clear what this is. She does not look that she feels well at all. We'll treat her for presumptive early diverticulitis with Cipro and Flagyl. I will also add rectal suppositories for rectal pain. We'll check some labs today. She will call me if not improving in a For 48 hours or she has new symptoms.

## 2012-01-20 ENCOUNTER — Other Ambulatory Visit: Payer: Self-pay | Admitting: Dermatology

## 2012-01-31 ENCOUNTER — Other Ambulatory Visit: Payer: Self-pay | Admitting: Family Medicine

## 2012-02-02 ENCOUNTER — Other Ambulatory Visit: Payer: Self-pay | Admitting: Family Medicine

## 2012-02-02 DIAGNOSIS — Z1231 Encounter for screening mammogram for malignant neoplasm of breast: Secondary | ICD-10-CM

## 2012-02-27 ENCOUNTER — Other Ambulatory Visit (HOSPITAL_COMMUNITY): Payer: 59

## 2012-03-01 ENCOUNTER — Ambulatory Visit (HOSPITAL_COMMUNITY)
Admission: RE | Admit: 2012-03-01 | Discharge: 2012-03-01 | Disposition: A | Discharge status: Home / Self Care | Payer: 59 | Source: Ambulatory Visit | Attending: Family Medicine | Admitting: Family Medicine

## 2012-03-01 DIAGNOSIS — Z1231 Encounter for screening mammogram for malignant neoplasm of breast: Secondary | ICD-10-CM | POA: Insufficient documentation

## 2012-03-05 ENCOUNTER — Ambulatory Visit: Payer: 59 | Admitting: Cardiovascular Disease

## 2012-03-16 ENCOUNTER — Encounter: Payer: Self-pay | Admitting: *Deleted

## 2012-03-19 ENCOUNTER — Other Ambulatory Visit (HOSPITAL_COMMUNITY): Payer: Self-pay | Admitting: Cardiovascular Disease

## 2012-03-19 DIAGNOSIS — I359 Nonrheumatic aortic valve disorder, unspecified: Secondary | ICD-10-CM

## 2012-03-20 ENCOUNTER — Ambulatory Visit (HOSPITAL_COMMUNITY): Payer: 59 | Attending: Cardiology | Admitting: Radiology

## 2012-03-20 DIAGNOSIS — I359 Nonrheumatic aortic valve disorder, unspecified: Secondary | ICD-10-CM

## 2012-03-20 DIAGNOSIS — E785 Hyperlipidemia, unspecified: Secondary | ICD-10-CM | POA: Insufficient documentation

## 2012-03-20 DIAGNOSIS — I517 Cardiomegaly: Secondary | ICD-10-CM | POA: Insufficient documentation

## 2012-03-20 DIAGNOSIS — I10 Essential (primary) hypertension: Secondary | ICD-10-CM | POA: Insufficient documentation

## 2012-03-20 NOTE — Progress Notes (Signed)
Echocardiogram performed.  

## 2012-03-23 ENCOUNTER — Ambulatory Visit: Payer: 59 | Admitting: Cardiovascular Disease

## 2012-05-24 ENCOUNTER — Other Ambulatory Visit: Payer: Self-pay | Admitting: Family Medicine

## 2012-06-21 ENCOUNTER — Ambulatory Visit (INDEPENDENT_AMBULATORY_CARE_PROVIDER_SITE_OTHER): Payer: 59 | Admitting: Family Medicine

## 2012-06-21 VITALS — BP 194/75 | HR 98 | Ht 62.0 in | Wt 248.0 lb

## 2012-06-21 DIAGNOSIS — M1711 Unilateral primary osteoarthritis, right knee: Secondary | ICD-10-CM

## 2012-06-21 DIAGNOSIS — IMO0002 Reserved for concepts with insufficient information to code with codable children: Secondary | ICD-10-CM

## 2012-06-21 DIAGNOSIS — M1710 Unilateral primary osteoarthritis, unspecified knee: Secondary | ICD-10-CM

## 2012-06-21 MED ORDER — TRAMADOL HCL 50 MG PO TABS: ORAL_TABLET | ORAL | Status: DC

## 2012-06-21 NOTE — Progress Notes (Signed)
  Subjective:    Patient ID: Pamela Thomas, female    DOB: [birth year], 56 y.o.   MRN: [id number]  HPI  Knee pain. In the past has seen orthopedists and was told she is close to being a total knee replacement. She has had wedding next week and needs to be able to walk down the I'll. She is unable to totally straighten the right knee now. Isn't keeping her up at night and also awakening her with pain. She has to go up steps one at a time. She's not had any swelling or redness or erythema of the knee. She's been using only Mobic for pain.  Review of Systems Denies fever.    Objective:   Physical Exam Vital signs are reviewed GENERAL: Well-developed female overweight no acute distress KNEE: Right. . She lacks 15-20 and full extension. She has full flexion. Medial joint line is quite tender and she has a small effusion. The calf is soft and distally she is neurovascularly intact. INJECTION: Patient was given informed consent, signed copy in the chart. Appropriate time out was taken. Area prepped and draped in usual sterile fashion. One cc of methylprednisolone 40 mg/ml plus  4 cc of 1% lidocaine without epinephrine was injected into the right knee using a(n) anterior medial approach. The patient tolerated the procedure well. There were no complications. Post procedure instructions were given.        Assessment & Plan:  #1. End stage OA of the right knee. Corticosteroid injection today I'll start her on some tramadol. She will let me know after the wedding she needs help getting the referral to the orthopedist. She'll see me for regular followup in a couple of months.

## 2012-07-11 ENCOUNTER — Telehealth: Payer: Self-pay | Admitting: Family Medicine

## 2012-07-11 NOTE — Telephone Encounter (Signed)
Is asking about referral for her orthopedics - hasn't heard anything yet.  pls advise

## 2012-07-11 NOTE — Telephone Encounter (Signed)
Dear Dema Severin Team OK--I am putting in referral to Dr Maureen Ralphs if you can make the appt [facility]! [name]

## 2012-07-11 NOTE — Telephone Encounter (Signed)
Dr. Nori Riis,  I spoke with patient and she is very ready to get the orthopaedic referral going. She stated that the cortisone shout lasted only 8 days, which were wonderful days. She would like to go back to Dr. Wynelle Link with [facility] TY soooo much.  ~~~~~Rylah Fukuda

## 2012-07-17 ENCOUNTER — Telehealth: Payer: Self-pay | Admitting: Family Medicine

## 2012-07-17 ENCOUNTER — Other Ambulatory Visit: Payer: Self-pay | Admitting: Family Medicine

## 2012-07-17 DIAGNOSIS — M1711 Unilateral primary osteoarthritis, right knee: Secondary | ICD-10-CM

## 2012-07-17 NOTE — Telephone Encounter (Signed)
Spoke with patient and informed her of the set up Orthopaedic appointment. It is 11/15 @ 9:30am patient to arrive at 9:15am. It is with Dr. Anne Fu PA Luetta Nutting. Patient  Agreed to see the PA due to her having an appointment in 2 weeks, Dr. Anne Fu next appointment is not until mid to end December and patient said that was too long to wait. Faxed OV notes and relative information to [phone]. [facility] [address] phone number is [phone]

## 2012-07-17 NOTE — Telephone Encounter (Signed)
Patient is calling to let her MD know that she really need that Orthopaedic Referral because she is in a lot of pain.

## 2012-07-23 ENCOUNTER — Other Ambulatory Visit: Payer: Self-pay | Admitting: Family Medicine

## 2012-10-01 NOTE — Progress Notes (Signed)
Need orders in EPIC.  Surgery is for 10/10/12.  Preop 10/05/12 at 200pm.  Thanks.

## 2012-10-02 ENCOUNTER — Encounter (HOSPITAL_COMMUNITY): Payer: Self-pay | Admitting: Pharmacy Technician

## 2012-10-02 ENCOUNTER — Other Ambulatory Visit: Payer: Self-pay | Admitting: Orthopedic Surgery

## 2012-10-02 MED ORDER — BUPIVACAINE LIPOSOME 1.3 % IJ SUSP: 20.0000 mL | Freq: Once | INTRAMUSCULAR | Status: DC

## 2012-10-02 MED ORDER — DEXAMETHASONE SODIUM PHOSPHATE 10 MG/ML IJ SOLN: 10.0000 mg | Freq: Once | INTRAMUSCULAR | Status: DC

## 2012-10-02 NOTE — Progress Notes (Signed)
Preoperative surgical orders have been place into the Epic hospital system for [place] on 10/02/2012, 9:49 AM  by Mickel Crow for surgery on 10/10/2012.  Preop Total Knee orders including Experal, IV Tylenol, and IV Decadron as long as there are no contraindications to the above medications. Arlee Muslim, PA-C

## 2012-10-04 ENCOUNTER — Other Ambulatory Visit (HOSPITAL_COMMUNITY): Payer: Self-pay | Admitting: Orthopedic Surgery

## 2012-10-04 NOTE — Patient Instructions (Addendum)
[place]  10/04/2012   Your procedure is scheduled on: 10/10/12  [facility]   Report to [place] at  [place]     AM.  Call this number if you have problems the morning of surgery: [phone]       Remember: DO NOT [place]  Do not eat food  Or drink :After Midnight. Tuesday NIGHT   Take these medicines the morning of surgery with A SIP OF WATER: TOPROL   .  Contacts, dentures or partial plates can not be worn to surgery  Leave suitcase in the car. After surgery it may be brought to your room.  For patients admitted to the hospital, checkout time is 11:00 AM day of  discharge.             SPECIAL INSTRUCTIONS- SEE [place] PREPARING FOR SURGERY INSTRUCTION SHEET-     DO NOT WEAR JEWELRY, LOTIONS, POWDERS, OR PERFUMES.  WOMEN-- DO NOT SHAVE LEGS OR UNDERARMS FOR 12 HOURS BEFORE SHOWERS. MEN MAY SHAVE FACE.  Patients discharged the day of surgery will not be allowed to drive home. IF going home the day of surgery, you must have a driver and someone to stay with you for the first 24 hours  Name and phone number of your driver:    admission      Husband [name]                                                              Please read over the following fact sheets that you were given: MRSA Information, Incentive Spirometry Sheet, Blood Transfusion Sheet  Information                                                                                   Pamela Thomas  PST [address]  [id number]                 FAILURE TO FOLLOW THESE INSTRUCTIONS MAY RESULT IN  CANCELLATION   OF YOUR SURGERY                                                  Patient Signature _____________________________

## 2012-10-04 NOTE — H&P (Signed)
TOTAL KNEE ADMISSION H&P  Patient is being admitted for right total knee arthroplasty.  Subjective:  Chief Complaint:right knee pain.  HPI: Pamela Thomas, 57 y.o. female, has a history of pain and functional disability in the right knee due to arthritis and has failed non-surgical conservative treatments for greater than 12 weeks to includecorticosteriod injections, viscosupplementation injections, flexibility and strengthening excercises and activity modification.  Onset of symptoms was gradual, starting 5 years ago with gradually worsening course since that time. The patient noted no past surgery on the right knee(s).  Patient currently rates pain in the right knee(s) at 8 out of 10 with activity. Patient has night pain, worsening of pain with activity and weight bearing, pain that interferes with activities of daily living, pain with passive range of motion, crepitus and joint swelling.  Patient has evidence of subchondral sclerosis, periarticular osteophytes and joint space narrowing by imaging studies. There is no active infection.  Patient Active Problem List   Diagnosis Date Noted  . Right knee DJD 06/21/2012  . Family history of malignant melanoma 06/15/2011  . Vertigo 05/11/2011  . Dizzy 04/22/2011  . Exercise intolerance 01/20/2011  . Rash 01/20/2011  . UNSPECIFIED LABYRINTHITIS 12/09/2009  . HYPERGLYCEMIA 12/09/2009  . CARDIAC MURMUR 05/27/2009  . PLANTAR FASCIITIS, BILATERAL 05/14/2008  . DYSLIPIDEMIA 05/21/2007  . ALLERGIC RHINITIS, SEASONAL 01/08/2007  . OBESITY, NOS 11/09/2006  . HYPERTENSION, BENIGN SYSTEMIC 11/09/2006  . OSTEOARTHRITIS, LOWER LEG 11/09/2006   Past Medical History  Diagnosis Date  . Glucose intolerance (impaired glucose tolerance)   . Osteoarthritis     rt knee  . Heart murmur     Left ventricle: The cavity size was normal. Wall thickness was  . DYSLIPIDEMIA   . OBESITY, NOS   . UNSPECIFIED LABYRINTHITIS   . ALLERGIC RHINITIS, SEASONAL   .  PLANTAR FASCIITIS, BILATERAL   . CARDIAC MURMUR   . HYPERGLYCEMIA   . Exercise intolerance   . Rash   . Dizzy   . Vertigo     Past Surgical History  Procedure Date  . Carpal tunnel release 05/27/03  . Cesarean section 05/27/03  . Breast biopsy 05/27/03     Current outpatient prescriptions: acetaminophen (TYLENOL) 500 MG tablet, Take 1,000 mg by mouth every 6 (six) hours as needed. PAIN, Disp: , Rfl: ;   aspirin 81 MG tablet, Take 81 mg by mouth daily.  , Disp: , Rfl: ;   furosemide (LASIX) 20 MG tablet, Take 20 mg by mouth daily before breakfast., Disp: , Rfl: ;   ibuprofen (ADVIL,MOTRIN) 200 MG tablet, Take 400-800 mg by mouth every 6 (six) hours as needed. PAIN, Disp: , Rfl:  KLOR-CON M20 20 MEQ tablet, 1 BY MOUTH ONCE DAILY, Disp: 90 tablet, Rfl: 3;   losartan (COZAAR) 100 MG tablet, Take 100 mg by mouth daily before breakfast., Disp: , Rfl: ;  meloxicam (MOBIC) 7.5 MG tablet, Take 15 mg by mouth daily before breakfast., Disp: , Rfl: ;  metFORMIN (GLUCOPHAGE) 500 MG tablet, Take 500 mg by mouth daily with breakfast., Disp: , Rfl:  metoprolol succinate (TOPROL-XL) 100 MG 24 hr tablet, Take 200 mg by mouth daily before breakfast. Take with or immediately following a meal., Disp: , Rfl: ;   traMADol (ULTRAM) 50 MG tablet, Take 100 mg by mouth at bedtime., Disp: , Rfl: ;  triamterene-hydrochlorothiazide (DYAZIDE) 37.5-25 MG per capsule, Take 1 capsule by mouth daily before breakfast., Disp: , Rfl:   No Known Allergies  History  Substance  Use Topics  . Smoking status: Former Smoker    Quit date: 09/12/1990  . Smokeless tobacco: Not on file  . Alcohol Use: Yes     Comment: rare    Family History  Problem Relation Age of Onset  . Diabetes    . Lung cancer       Review of Systems  Constitutional: Positive for malaise/fatigue and diaphoresis. Negative for fever, chills and weight loss.  HENT: Negative.  Negative for neck pain.   Eyes: Negative.   Respiratory: Negative.     Cardiovascular: Negative.   Gastrointestinal: Negative.   Genitourinary: Negative.   Musculoskeletal: Positive for joint pain. Negative for myalgias, back pain and falls.       Right knee pain  Skin: Negative.   Neurological: Negative.  Negative for weakness.  Endo/Heme/Allergies: Negative.   Psychiatric/Behavioral: Negative.     Objective:  Physical Exam  Constitutional: She is oriented to person, place, and time. She appears well-developed and well-nourished. No distress.  HENT:  Head: Normocephalic and atraumatic.  Right Ear: External ear normal.  Left Ear: External ear normal.  Nose: Nose normal.  Mouth/Throat: Oropharynx is clear and moist.  Eyes: Conjunctivae normal and EOM are normal.  Neck: Normal range of motion. Neck supple. No tracheal deviation present. No thyromegaly present.  Cardiovascular: Normal rate, regular rhythm, normal heart sounds and intact distal pulses.   No murmur heard. Respiratory: Effort normal and breath sounds normal. No respiratory distress. She has no wheezes. She exhibits no tenderness.  GI: Soft. Bowel sounds are normal. She exhibits no distension and no mass. There is no tenderness.  Musculoskeletal:       Right hip: Normal.       Left hip: Normal.       Right knee: She exhibits decreased range of motion and swelling. She exhibits no erythema.       Left knee: Normal.       Right lower leg: She exhibits no tenderness and no swelling.       Left lower leg: She exhibits no tenderness and no swelling.       Legs: Lymphadenopathy:    She has no cervical adenopathy.  Neurological: She is alert and oriented to person, place, and time. She has normal strength and normal reflexes. No sensory deficit.  Skin: No rash noted. She is not diaphoretic. No erythema.  Psychiatric: She has a normal mood and affect. Her behavior is normal.   Vitals Weight: 247 lb Height: 61.75 in Body Surface Area: 2.21 m Body Mass Index: 45.54 kg/m Pulse: 72  (Regular) Resp.: 17 (Unlabored) BP: 118/68 (Sitting, Left Arm, Standard)   Estimated Body mass index is 45.36 kg/(m^2) as calculated from the following:   Height as of 06/21/12: 5\' 2" (1.575 m).   Weight as of 06/21/12: 248 lb(112.492 kg).   Imaging Review Plain radiographs demonstrate severe degenerative joint disease of the right knee(s). The overall alignment ismild varus. The bone quality appears to be good for age and reported activity level.  Assessment/Plan:  End stage arthritis, right knee   The patient history, physical examination, clinical judgment of the provider and imaging studies are consistent with end stage degenerative joint disease of the right knee(s) and total knee arthroplasty is deemed medically necessary. The treatment options including medical management, injection therapy arthroscopy and arthroplasty were discussed at length. The risks and benefits of total knee arthroplasty were presented and reviewed. The risks due to aseptic loosening, infection, stiffness, patella tracking problems, thromboembolic  complications and other imponderables were discussed. The patient acknowledged the explanation, agreed to proceed with the plan and consent was signed. Patient is being admitted for inpatient treatment for surgery, pain control, PT, OT, prophylactic antibiotics, VTE prophylaxis, progressive ambulation and ADL's and discharge planning. The patient is planning to be discharged home with home health services     [place], [place]

## 2012-10-05 ENCOUNTER — Ambulatory Visit (HOSPITAL_COMMUNITY)
Admission: RE | Admit: 2012-10-05 | Discharge: 2012-10-05 | Disposition: A | Discharge status: Home / Self Care | Payer: 59 | Source: Ambulatory Visit | Attending: Orthopedic Surgery | Admitting: Orthopedic Surgery

## 2012-10-05 ENCOUNTER — Encounter (HOSPITAL_COMMUNITY): Payer: Self-pay

## 2012-10-05 ENCOUNTER — Encounter (HOSPITAL_COMMUNITY)
Admission: RE | Admit: 2012-10-05 | Discharge: 2012-10-05 | Disposition: A | Discharge status: Home / Self Care | Payer: 59 | Source: Ambulatory Visit | Attending: Orthopedic Surgery | Admitting: Orthopedic Surgery

## 2012-10-05 DIAGNOSIS — Z01812 Encounter for preprocedural laboratory examination: Secondary | ICD-10-CM | POA: Insufficient documentation

## 2012-10-05 DIAGNOSIS — M1710 Unilateral primary osteoarthritis, unspecified knee: Secondary | ICD-10-CM | POA: Insufficient documentation

## 2012-10-05 DIAGNOSIS — Z01810 Encounter for preprocedural cardiovascular examination: Secondary | ICD-10-CM | POA: Insufficient documentation

## 2012-10-05 HISTORY — DX: Type 2 diabetes mellitus without complications: E11.9

## 2012-10-05 HISTORY — DX: Cardiac arrhythmia, unspecified: I49.9

## 2012-10-05 HISTORY — DX: Essential (primary) hypertension: I10

## 2012-10-05 HISTORY — DX: Personal history of other medical treatment: Z92.89

## 2012-10-05 LAB — COMPREHENSIVE METABOLIC PANEL
ALT: 24 U/L (ref 0–35)
AST: 28 U/L (ref 0–37)
Albumin: 3.7 g/dL (ref 3.5–5.2)
CO2: 29 mEq/L (ref 19–32)
Calcium: 9.8 mg/dL (ref 8.4–10.5)
GFR calc non Af Amer: 64 mL/min — ABNORMAL LOW (ref >90)
Sodium: 134 mEq/L — ABNORMAL LOW (ref 135–145)

## 2012-10-05 LAB — URINALYSIS, ROUTINE W REFLEX MICROSCOPIC
Glucose, UA: NEGATIVE mg/dL
Ketones, ur: NEGATIVE mg/dL
Leukocytes, UA: NEGATIVE
Specific Gravity, Urine: 1.015 (ref 1.005–1.030)
pH: 5.5 (ref 5.0–8.0)

## 2012-10-05 LAB — CBC
MCH: 30.5 pg (ref 26.0–34.0)
Platelets: 242 10*3/uL (ref 150–400)
RBC: 4.3 MIL/uL (ref 3.87–5.11)
RDW: 15.2 % (ref 11.5–15.5)
WBC: 7.5 10*3/uL (ref 4.0–10.5)

## 2012-10-05 LAB — SURGICAL PCR SCREEN
MRSA, PCR: NEGATIVE
Staphylococcus aureus: NEGATIVE

## 2012-10-05 NOTE — Progress Notes (Signed)
eccho 7/13 epic, LOV Dr Johnsie Cancel 7/12 epic.  Patient states that palpitations have resolved with medication dosage change

## 2012-10-09 MED ORDER — CEFAZOLIN SODIUM-DEXTROSE 2-3 GM-% IV SOLR
2.0000 g | INTRAVENOUS | Status: AC
  Administered 2012-10-10: 2 g via INTRAVENOUS

## 2012-10-10 ENCOUNTER — Encounter (HOSPITAL_COMMUNITY): Payer: Self-pay | Admitting: Anesthesiology

## 2012-10-10 ENCOUNTER — Encounter: Payer: Self-pay | Admitting: Family Medicine

## 2012-10-10 ENCOUNTER — Inpatient Hospital Stay (HOSPITAL_COMMUNITY): Payer: 59 | Admitting: Anesthesiology

## 2012-10-10 ENCOUNTER — Encounter (HOSPITAL_COMMUNITY)
Admission: RE | Disposition: A | Discharge status: Home Health | Payer: Self-pay | Source: Ambulatory Visit | Attending: Orthopedic Surgery

## 2012-10-10 ENCOUNTER — Encounter (HOSPITAL_COMMUNITY): Payer: Self-pay | Admitting: *Deleted

## 2012-10-10 ENCOUNTER — Inpatient Hospital Stay (HOSPITAL_COMMUNITY)
Admission: RE | Admit: 2012-10-10 | Discharge: 2012-10-13 | DRG: 470 | Disposition: A | Discharge status: Home Health | Payer: 59 | Source: Ambulatory Visit | Attending: Orthopedic Surgery | Admitting: Orthopedic Surgery

## 2012-10-10 DIAGNOSIS — Z96659 Presence of unspecified artificial knee joint: Secondary | ICD-10-CM

## 2012-10-10 DIAGNOSIS — I10 Essential (primary) hypertension: Secondary | ICD-10-CM | POA: Diagnosis present

## 2012-10-10 DIAGNOSIS — E669 Obesity, unspecified: Secondary | ICD-10-CM | POA: Diagnosis present

## 2012-10-10 DIAGNOSIS — E119 Type 2 diabetes mellitus without complications: Secondary | ICD-10-CM | POA: Diagnosis present

## 2012-10-10 DIAGNOSIS — M179 Osteoarthritis of knee, unspecified: Secondary | ICD-10-CM | POA: Diagnosis present

## 2012-10-10 DIAGNOSIS — M1710 Unilateral primary osteoarthritis, unspecified knee: Principal | ICD-10-CM | POA: Diagnosis present

## 2012-10-10 DIAGNOSIS — Z6841 Body Mass Index (BMI) 40.0 and over, adult: Secondary | ICD-10-CM

## 2012-10-10 HISTORY — PX: TOTAL KNEE ARTHROPLASTY: SHX125

## 2012-10-10 LAB — TYPE AND SCREEN
ABO/RH(D): AB NEG
Antibody Screen: NEGATIVE

## 2012-10-10 LAB — GLUCOSE, CAPILLARY: Glucose-Capillary: 212 mg/dL — ABNORMAL HIGH (ref 70–99)

## 2012-10-10 SURGERY — ARTHROPLASTY, KNEE, TOTAL
Anesthesia: Spinal | Site: Knee | Laterality: Right | Wound class: Clean

## 2012-10-10 MED ORDER — METOCLOPRAMIDE HCL 5 MG/ML IJ SOLN: 5.0000 mg | Freq: Three times a day (TID) | INTRAMUSCULAR | Status: DC | PRN

## 2012-10-10 MED ORDER — HYDROMORPHONE HCL PF 1 MG/ML IJ SOLN
0.2500 mg | INTRAMUSCULAR | Status: DC | PRN
  Administered 2012-10-10 (×4): 0.5 mg via INTRAVENOUS

## 2012-10-10 MED ORDER — SALINE FLUSH 0.9 % IV SOLN: INTRAVENOUS | Status: DC | PRN

## 2012-10-10 MED ORDER — TRAMADOL HCL 50 MG PO TABS
100.0000 mg | ORAL_TABLET | Freq: Every day | ORAL | Status: DC
  Administered 2012-10-10 – 2012-10-12 (×3): 100 mg via ORAL
  Filled 2012-10-10 (×4): qty 2

## 2012-10-10 MED ORDER — LACTATED RINGERS IV SOLN: INTRAVENOUS | Status: DC

## 2012-10-10 MED ORDER — METHOCARBAMOL 100 MG/ML IJ SOLN
500.0000 mg | Freq: Four times a day (QID) | INTRAVENOUS | Status: DC | PRN
  Administered 2012-10-10: 500 mg via INTRAVENOUS
  Filled 2012-10-10: qty 5

## 2012-10-10 MED ORDER — BUPIVACAINE IN DEXTROSE 0.75-8.25 % IT SOLN
INTRATHECAL | Status: DC | PRN
  Administered 2012-10-10: 1.8 mL via INTRATHECAL

## 2012-10-10 MED ORDER — ONDANSETRON HCL 4 MG/2ML IJ SOLN
INTRAMUSCULAR | Status: DC | PRN
  Administered 2012-10-10: 2 mg via INTRAVENOUS

## 2012-10-10 MED ORDER — PROMETHAZINE HCL 25 MG/ML IJ SOLN: 6.2500 mg | INTRAMUSCULAR | Status: DC | PRN

## 2012-10-10 MED ORDER — TRAMADOL HCL 50 MG PO TABS: 50.0000 mg | ORAL_TABLET | Freq: Four times a day (QID) | ORAL | Status: DC | PRN

## 2012-10-10 MED ORDER — METFORMIN HCL 500 MG PO TABS
500.0000 mg | ORAL_TABLET | Freq: Every day | ORAL | Status: DC
  Administered 2012-10-11: 500 mg via ORAL
  Filled 2012-10-10 (×2): qty 1

## 2012-10-10 MED ORDER — LOSARTAN POTASSIUM 50 MG PO TABS
100.0000 mg | ORAL_TABLET | Freq: Every day | ORAL | Status: DC
  Administered 2012-10-11 – 2012-10-13 (×3): 100 mg via ORAL
  Filled 2012-10-10 (×4): qty 2

## 2012-10-10 MED ORDER — EPHEDRINE SULFATE 50 MG/ML IJ SOLN
INTRAMUSCULAR | Status: DC | PRN
  Administered 2012-10-10: 5 mg via INTRAVENOUS
  Administered 2012-10-10: 10 mg via INTRAVENOUS
  Administered 2012-10-10: 5 mg via INTRAVENOUS

## 2012-10-10 MED ORDER — OXYCODONE HCL 5 MG PO TABS
5.0000 mg | ORAL_TABLET | ORAL | Status: DC | PRN
  Administered 2012-10-10: 10 mg via ORAL
  Administered 2012-10-10: 5 mg via ORAL
  Administered 2012-10-11 (×2): 10 mg via ORAL
  Filled 2012-10-10 (×3): qty 2

## 2012-10-10 MED ORDER — METOCLOPRAMIDE HCL 10 MG PO TABS: 5.0000 mg | ORAL_TABLET | Freq: Three times a day (TID) | ORAL | Status: DC | PRN

## 2012-10-10 MED ORDER — DOCUSATE SODIUM 100 MG PO CAPS
100.0000 mg | ORAL_CAPSULE | Freq: Two times a day (BID) | ORAL | Status: DC
  Administered 2012-10-10 – 2012-10-13 (×6): 100 mg via ORAL

## 2012-10-10 MED ORDER — INSULIN ASPART 100 UNIT/ML ~~LOC~~ SOLN
0.0000 [IU] | Freq: Three times a day (TID) | SUBCUTANEOUS | Status: DC
  Administered 2012-10-10: 5 [IU] via SUBCUTANEOUS
  Administered 2012-10-11 (×2): 3 [IU] via SUBCUTANEOUS
  Administered 2012-10-11: 5 [IU] via SUBCUTANEOUS
  Administered 2012-10-12: 3 [IU] via SUBCUTANEOUS
  Administered 2012-10-12: 5 [IU] via SUBCUTANEOUS
  Administered 2012-10-13: 3 [IU] via SUBCUTANEOUS
  Administered 2012-10-13: 5 [IU] via SUBCUTANEOUS

## 2012-10-10 MED ORDER — METHOCARBAMOL 500 MG PO TABS
500.0000 mg | ORAL_TABLET | Freq: Four times a day (QID) | ORAL | Status: DC | PRN
  Administered 2012-10-10 – 2012-10-13 (×8): 500 mg via ORAL
  Filled 2012-10-10 (×8): qty 1

## 2012-10-10 MED ORDER — TRIAMTERENE-HCTZ 37.5-25 MG PO CAPS
1.0000 | ORAL_CAPSULE | Freq: Every day | ORAL | Status: DC
  Administered 2012-10-11 – 2012-10-13 (×3): 1 via ORAL
  Filled 2012-10-10 (×4): qty 1

## 2012-10-10 MED ORDER — SODIUM CHLORIDE 0.9 % IV SOLN: INTRAVENOUS | Status: DC

## 2012-10-10 MED ORDER — ACETAMINOPHEN 10 MG/ML IV SOLN
1000.0000 mg | Freq: Four times a day (QID) | INTRAVENOUS | Status: AC
  Administered 2012-10-10 – 2012-10-11 (×4): 1000 mg via INTRAVENOUS
  Filled 2012-10-10 (×6): qty 100

## 2012-10-10 MED ORDER — LACTATED RINGERS IV SOLN
INTRAVENOUS | Status: DC | PRN
  Administered 2012-10-10 (×2): via INTRAVENOUS

## 2012-10-10 MED ORDER — BISACODYL 10 MG RE SUPP: 10.0000 mg | Freq: Every day | RECTAL | Status: DC | PRN

## 2012-10-10 MED ORDER — MEPERIDINE HCL 50 MG/ML IJ SOLN: 6.2500 mg | INTRAMUSCULAR | Status: DC | PRN

## 2012-10-10 MED ORDER — SODIUM CHLORIDE 0.9 % IJ SOLN
INTRAMUSCULAR | Status: DC | PRN
  Administered 2012-10-10: 50 mL via INTRAVENOUS

## 2012-10-10 MED ORDER — CEFAZOLIN SODIUM-DEXTROSE 2-3 GM-% IV SOLR
2.0000 g | Freq: Four times a day (QID) | INTRAVENOUS | Status: AC
  Administered 2012-10-10 (×2): 2 g via INTRAVENOUS
  Filled 2012-10-10 (×2): qty 50

## 2012-10-10 MED ORDER — POLYETHYLENE GLYCOL 3350 17 G PO PACK: 17.0000 g | PACK | Freq: Every day | ORAL | Status: DC | PRN

## 2012-10-10 MED ORDER — ONDANSETRON HCL 4 MG PO TABS: 4.0000 mg | ORAL_TABLET | Freq: Four times a day (QID) | ORAL | Status: DC | PRN

## 2012-10-10 MED ORDER — FLEET ENEMA 7-19 GM/118ML RE ENEM: 1.0000 | ENEMA | Freq: Once | RECTAL | Status: AC | PRN

## 2012-10-10 MED ORDER — DEXAMETHASONE SODIUM PHOSPHATE 10 MG/ML IJ SOLN: 10.0000 mg | Freq: Once | INTRAMUSCULAR | Status: AC

## 2012-10-10 MED ORDER — POTASSIUM CHLORIDE IN NACL 20-0.9 MEQ/L-% IV SOLN
INTRAVENOUS | Status: DC
  Administered 2012-10-10 – 2012-10-11 (×2): via INTRAVENOUS
  Filled 2012-10-10 (×5): qty 1000

## 2012-10-10 MED ORDER — DIPHENHYDRAMINE HCL 12.5 MG/5ML PO ELIX: 12.5000 mg | ORAL_SOLUTION | ORAL | Status: DC | PRN

## 2012-10-10 MED ORDER — PHENOL 1.4 % MT LIQD: 1.0000 | OROMUCOSAL | Status: DC | PRN

## 2012-10-10 MED ORDER — MIDAZOLAM HCL 5 MG/5ML IJ SOLN
INTRAMUSCULAR | Status: DC | PRN
  Administered 2012-10-10: 1 mg via INTRAVENOUS
  Administered 2012-10-10: 0.5 mg via INTRAVENOUS

## 2012-10-10 MED ORDER — RIVAROXABAN 10 MG PO TABS
10.0000 mg | ORAL_TABLET | Freq: Every day | ORAL | Status: DC
  Administered 2012-10-11 – 2012-10-13 (×3): 10 mg via ORAL
  Filled 2012-10-10 (×4): qty 1

## 2012-10-10 MED ORDER — ONDANSETRON HCL 4 MG/2ML IJ SOLN
4.0000 mg | Freq: Four times a day (QID) | INTRAMUSCULAR | Status: DC | PRN
  Administered 2012-10-11 – 2012-10-13 (×2): 4 mg via INTRAVENOUS
  Filled 2012-10-10 (×2): qty 2

## 2012-10-10 MED ORDER — 0.9 % SODIUM CHLORIDE (POUR BTL) OPTIME
TOPICAL | Status: DC | PRN
  Administered 2012-10-10: 1000 mL

## 2012-10-10 MED ORDER — PROPOFOL 10 MG/ML IV EMUL
INTRAVENOUS | Status: DC | PRN
  Administered 2012-10-10: 100 ug/kg/min via INTRAVENOUS

## 2012-10-10 MED ORDER — ACETAMINOPHEN 325 MG PO TABS: 650.0000 mg | ORAL_TABLET | Freq: Four times a day (QID) | ORAL | Status: DC | PRN

## 2012-10-10 MED ORDER — MORPHINE SULFATE 10 MG/ML IJ SOLN
1.0000 mg | INTRAMUSCULAR | Status: DC | PRN
  Administered 2012-10-10: 2 mg via INTRAVENOUS
  Administered 2012-10-10: 1 mg via INTRAVENOUS
  Filled 2012-10-10: qty 1

## 2012-10-10 MED ORDER — FENTANYL CITRATE 0.05 MG/ML IJ SOLN
INTRAMUSCULAR | Status: DC | PRN
  Administered 2012-10-10 (×2): 50 ug via INTRAVENOUS

## 2012-10-10 MED ORDER — BUPIVACAINE LIPOSOME 1.3 % IJ SUSP
20.0000 mL | Freq: Once | INTRAMUSCULAR | Status: AC
Start: 2012-10-10 — End: 2012-10-10
  Administered 2012-10-10: 20 mL
  Filled 2012-10-10: qty 20

## 2012-10-10 MED ORDER — POTASSIUM CHLORIDE CRYS ER 20 MEQ PO TBCR
20.0000 meq | EXTENDED_RELEASE_TABLET | Freq: Every day | ORAL | Status: DC
  Administered 2012-10-10 – 2012-10-13 (×4): 20 meq via ORAL
  Filled 2012-10-10 (×4): qty 1

## 2012-10-10 MED ORDER — METOPROLOL SUCCINATE ER 100 MG PO TB24
200.0000 mg | ORAL_TABLET | Freq: Every day | ORAL | Status: DC
  Administered 2012-10-11 – 2012-10-13 (×3): 200 mg via ORAL
  Filled 2012-10-10 (×4): qty 2

## 2012-10-10 MED ORDER — ACETAMINOPHEN 650 MG RE SUPP: 650.0000 mg | Freq: Four times a day (QID) | RECTAL | Status: DC | PRN

## 2012-10-10 MED ORDER — MENTHOL 3 MG MT LOZG
1.0000 | LOZENGE | OROMUCOSAL | Status: DC | PRN
  Filled 2012-10-10: qty 9

## 2012-10-10 MED ORDER — DEXAMETHASONE 6 MG PO TABS
10.0000 mg | ORAL_TABLET | Freq: Once | ORAL | Status: AC
  Administered 2012-10-11: 10 mg via ORAL
  Filled 2012-10-10: qty 1

## 2012-10-10 MED ORDER — FUROSEMIDE 20 MG PO TABS
20.0000 mg | ORAL_TABLET | Freq: Every day | ORAL | Status: DC
  Administered 2012-10-11 – 2012-10-13 (×3): 20 mg via ORAL
  Filled 2012-10-10 (×4): qty 1

## 2012-10-10 MED ORDER — ACETAMINOPHEN 10 MG/ML IV SOLN: 1000.0000 mg | Freq: Once | INTRAVENOUS | Status: DC

## 2012-10-10 SURGICAL SUPPLY — 55 items
BAG SPEC THK2 15X12 ZIP CLS (MISCELLANEOUS) ×1
BAG ZIPLOCK 12X15 (MISCELLANEOUS) ×2 IMPLANT
BANDAGE ELASTIC 6 VELCRO ST LF (GAUZE/BANDAGES/DRESSINGS) ×2 IMPLANT
BANDAGE ESMARK 6X9 LF (GAUZE/BANDAGES/DRESSINGS) ×1 IMPLANT
BLADE SAG 18X100X1.27 (BLADE) ×2 IMPLANT
BLADE SAW SGTL 11.0X1.19X90.0M (BLADE) ×2 IMPLANT
BNDG CMPR 9X6 STRL LF SNTH (GAUZE/BANDAGES/DRESSINGS) ×1
BNDG ESMARK 6X9 LF (GAUZE/BANDAGES/DRESSINGS) ×2
BOWL SMART MIX CTS (DISPOSABLE) ×2 IMPLANT
CATH KIT ON-Q SILVERSOAK 5 (CATHETERS) ×1 IMPLANT
CATH KIT ON-Q SILVERSOAK 5IN (CATHETERS) IMPLANT
CEMENT HV SMART SET (Cement) ×3 IMPLANT
CLOTH BEACON ORANGE TIMEOUT ST (SAFETY) ×2 IMPLANT
CUFF TOURN SGL QUICK 34 (TOURNIQUET CUFF) ×2
CUFF TRNQT CYL 34X4X40X1 (TOURNIQUET CUFF) ×1 IMPLANT
DRAPE EXTREMITY T 121X128X90 (DRAPE) ×2 IMPLANT
DRAPE POUCH INSTRU U-SHP 10X18 (DRAPES) ×2 IMPLANT
DRAPE U-SHAPE 47X51 STRL (DRAPES) ×2 IMPLANT
DRSG ADAPTIC 3X8 NADH LF (GAUZE/BANDAGES/DRESSINGS) ×2 IMPLANT
DURAPREP 26ML APPLICATOR (WOUND CARE) ×2 IMPLANT
ELECT REM PT RETURN 9FT ADLT (ELECTROSURGICAL) ×2
ELECTRODE REM PT RTRN 9FT ADLT (ELECTROSURGICAL) ×1 IMPLANT
EVACUATOR 1/8 PVC DRAIN (DRAIN) ×2 IMPLANT
FACESHIELD LNG OPTICON STERILE (SAFETY) ×10 IMPLANT
GLOVE BIO SURGEON STRL SZ8 (GLOVE) ×2 IMPLANT
GLOVE BIOGEL PI IND STRL 8 (GLOVE) ×2 IMPLANT
GLOVE BIOGEL PI INDICATOR 8 (GLOVE) ×2
GLOVE ECLIPSE 8.0 STRL XLNG CF (GLOVE) ×2 IMPLANT
GLOVE SURG SS PI 6.5 STRL IVOR (GLOVE) ×4 IMPLANT
GOWN STRL NON-REIN LRG LVL3 (GOWN DISPOSABLE) ×4 IMPLANT
GOWN STRL REIN XL XLG (GOWN DISPOSABLE) ×6 IMPLANT
HANDPIECE INTERPULSE COAX TIP (DISPOSABLE) ×2
IMMOBILIZER KNEE 20 (SOFTGOODS) ×2
IMMOBILIZER KNEE 20 THIGH 36 (SOFTGOODS) ×1 IMPLANT
KIT BASIN OR (CUSTOM PROCEDURE TRAY) ×2 IMPLANT
MANIFOLD NEPTUNE II (INSTRUMENTS) ×2 IMPLANT
NEEDLE 27GAX1X1/2 (NEEDLE) ×2 IMPLANT
NS IRRIG 1000ML POUR BTL (IV SOLUTION) ×2 IMPLANT
PACK TOTAL JOINT (CUSTOM PROCEDURE TRAY) ×2 IMPLANT
PAD ABD 7.5X8 STRL (GAUZE/BANDAGES/DRESSINGS) ×2 IMPLANT
PADDING CAST COTTON 6X4 STRL (CAST SUPPLIES) ×4 IMPLANT
POSITIONER SURGICAL ARM (MISCELLANEOUS) ×2 IMPLANT
SET HNDPC FAN SPRY TIP SCT (DISPOSABLE) ×1 IMPLANT
SPONGE GAUZE 4X4 12PLY (GAUZE/BANDAGES/DRESSINGS) ×2 IMPLANT
STRIP CLOSURE SKIN 1/2X4 (GAUZE/BANDAGES/DRESSINGS) ×4 IMPLANT
SUCTION FRAZIER 12FR DISP (SUCTIONS) ×2 IMPLANT
SUT MNCRL AB 4-0 PS2 18 (SUTURE) ×2 IMPLANT
SUT VIC AB 2-0 CT1 27 (SUTURE) ×6
SUT VIC AB 2-0 CT1 TAPERPNT 27 (SUTURE) ×3 IMPLANT
SUT VLOC 180 0 24IN GS25 (SUTURE) ×2 IMPLANT
SYR 50ML LL SCALE MARK (SYRINGE) ×2 IMPLANT
TOWEL OR 17X26 10 PK STRL BLUE (TOWEL DISPOSABLE) ×4 IMPLANT
TRAY FOLEY CATH 14FRSI W/METER (CATHETERS) ×2 IMPLANT
WATER STERILE IRR 1500ML POUR (IV SOLUTION) ×3 IMPLANT
WRAP KNEE MAXI GEL POST OP (GAUZE/BANDAGES/DRESSINGS) ×4 IMPLANT

## 2012-10-10 NOTE — Anesthesia Procedure Notes (Addendum)
Spinal  Patient location during procedure: OR Staffing Anesthesiologist: Luis Nickles Performed by: anesthesiologist  Preanesthetic Checklist Completed: patient identified, site marked, surgical consent, pre-op evaluation, timeout performed, IV checked, risks and benefits discussed and monitors and equipment checked Spinal Block Patient position: sitting Prep: Betadine Patient monitoring: heart rate, continuous pulse ox and blood pressure Approach: right paramedian Location: L3-4 Injection technique: single-shot Needle Needle type: Spinocan  Needle gauge: 22 G Needle length: 9 cm Additional Notes Expiration date of kit checked and confirmed. Patient tolerated procedure well, without complications.     

## 2012-10-10 NOTE — Interval H&P Note (Signed)
History and Physical Interval Note:  10/10/2012 8:38 AM  Pamela Thomas  has presented today for surgery, with the diagnosis of OA RIGHT KNEE   The various methods of treatment have been discussed with the patient and family. After consideration of risks, benefits and other options for treatment, the patient has consented to  Procedure(s) (LRB) with comments: TOTAL KNEE ARTHROPLASTY (Right) as a surgical intervention .  The patient's history has been reviewed, patient examined, no change in status, stable for surgery.  I have reviewed the patient's chart and labs.  Questions were answered to the patient's satisfaction.     Gearlean Alf

## 2012-10-10 NOTE — Transfer of Care (Addendum)
Immediate Anesthesia Transfer of Care Note  Patient: Pamela Thomas  Procedure(s) Performed: Procedure(s) (LRB): TOTAL KNEE ARTHROPLASTY (Right)  Patient Location: PACU  Anesthesia Type: Spinal  Level of Consciousness: sedated, patient cooperative and responds to stimulaton  Airway & Oxygen Therapy: Patient Spontanous Breathing and Patient connected to face mask oxgen  Post-op Assessment: Report given to PACU RN and Post -op Vital signs reviewed and stable  Post vital signs: Reviewed and stable  Complications: No apparent anesthesia complications L-1 level on spinal post procedure without complaint of pain, slight movement of bilat lower ext.

## 2012-10-10 NOTE — Op Note (Signed)
Pre-operative diagnosis- Osteoarthritis  Right knee(s)  Post-operative diagnosis- Osteoarthritis Right knee(s)  Procedure-  Right  Total Knee Arthroplasty  Surgeon- Dione Plover. Anila Bojarski, MD  Assistant- Arlee Muslim, PA-C   Anesthesia-  Spinal EBL-* No blood loss amount entered *  Drains Hemovac  Tourniquet time- 34 minutes @ [id number] mm Hg Complications- None  Condition-PACU - hemodynamically stable.   Brief Clinical Note   Pamela Thomas is a 57 y.o. year old female with end stage OA of her right knee with progressively worsening pain and dysfunction. She has constant pain, with activity and at rest and significant functional deficits with difficulties even with ADLs. She has had extensive non-op management including analgesics, injections of cortisone and viscosupplements, and home exercise program, but remains in significant pain with significant dysfunction.Radiographs show bone on bone arthritis medial and patellofemoral with varus deformity.. She presents now for right Total Knee Arthroplasty.    Procedure in detail---   The patient is brought into the operating room and positioned supine on the operating table. After successful administration of  Spinal,   a tourniquet is placed high on the  Right thigh(s) and the lower extremity is prepped and draped in the usual sterile fashion. Time out is performed by the operating team and then the  Right lower extremity is wrapped in Esmarch, knee flexed and the tourniquet inflated to 300 mmHg.       A midline incision is made with a ten blade through the subcutaneous tissue to the level of the extensor mechanism. A fresh blade is used to make a medial parapatellar arthrotomy. Soft tissue over the proximal medial tibia is subperiosteally elevated to the joint line with a knife and into the semimembranosus bursa with a Cobb elevator. Soft tissue over the proximal lateral tibia is elevated with attention being paid to avoiding the patellar tendon on the  tibial tubercle. The patella is everted, knee flexed 90 degrees and the ACL and PCL are removed. Findings are bone on bone medial and patellofemoral with large medial osteophytes.        The drill is used to create a starting hole in the distal femur and the canal is thoroughly irrigated with sterile saline to remove the fatty contents. The 5 degree Right  valgus alignment guide is placed into the femoral canal and the distal femoral cutting block is pinned to remove 10 mm off the distal femur. Resection is made with an oscillating saw.      The tibia is subluxed forward and the menisci are removed. The extramedullary alignment guide is placed referencing proximally at the medial aspect of the tibial tubercle and distally along the second metatarsal axis and tibial crest. The block is pinned to remove 54mm off the more deficient medial  side. Resection is made with an oscillating saw. Size 2.5is the most appropriate size for the tibia and the proximal tibia is prepared with the modular drill and keel punch for that size.      The femoral sizing guide is placed and size 2.5 is most appropriate. Rotation is marked off the epicondylar axis and confirmed by creating a rectangular flexion gap at 90 degrees. The size 2.5 cutting block is pinned in this rotation and the anterior, posterior and chamfer cuts are made with the oscillating saw. The intercondylar block is then placed and that cut is made.      Trial size 2.5 tibial component, trial size 2.5 posterior stabilized femur and a 12.5  mm posterior stabilized  rotating platform insert trial is placed. Full extension is achieved with excellent varus/valgus and anterior/posterior balance throughout full range of motion. The patella is everted and thickness measured to be 22  mm. Free hand resection is taken to 12 mm, a 35 template is placed, lug holes are drilled, trial patella is placed, and it tracks normally. Osteophytes are removed off the posterior femur with the  trial in place. All trials are removed and the cut bone surfaces prepared with pulsatile lavage. Cement is mixed and once ready for implantation, the size 2.5 tibial implant, size  2.5 posterior stabilized femoral component, and the size 35 patella are cemented in place and the patella is held with the clamp. The trial insert is placed and the knee held in full extension. The Exparel (20 ml mixed with 50 ml saline) is injected into the extensor mechanism, posterior capsule, medial and lateral gutters and subcutaneous tissues.  All extruded cement is removed and once the cement is hard the permanent 12.5 mm posterior stabilized rotating platform insert is placed into the tibial tray.      The wound is copiously irrigated with saline solution and the extensor mechanism closed over a hemovac drain with #1 PDS suture. The tourniquet is released for a total tourniquet time of 34  minutes. Flexion against gravity is 140 degrees and the patella tracks normally. Subcutaneous tissue is closed with 2.0 vicryl and subcuticular with running 4.0 Monocryl. The incision is cleaned and dried and steri-strips and a bulky sterile dressing are applied. The limb is placed into a knee immobilizer and the patient is awakened and transported to recovery in stable condition.      Please note that a surgical assistant was a medical necessity for this procedure in order to perform it in a safe and expeditious manner. Surgical assistant was necessary to retract the ligaments and vital neurovascular structures to prevent injury to them and also necessary for proper positioning of the limb to allow for anatomic placement of the prosthesis.   Dione Plover Gabriana Wilmott, MD    10/10/2012, 10:32 AM

## 2012-10-10 NOTE — Anesthesia Postprocedure Evaluation (Signed)
  Anesthesia Post-op Note  Patient: Pamela Thomas  Procedure(s) Performed: Procedure(s) (LRB): TOTAL KNEE ARTHROPLASTY (Right)  Patient Location: PACU  Anesthesia Type: Spinal  Level of Consciousness: awake and alert   Airway and Oxygen Therapy: Patient Spontanous Breathing  Post-op Pain: mild  Post-op Assessment: Post-op Vital signs reviewed, Patient's Cardiovascular Status Stable, Respiratory Function Stable, Patent Airway and No signs of Nausea or vomiting  Last Vitals:  Filed Vitals:   10/10/12 1230  BP: 136/71  Pulse: 69  Temp:   Resp: 10    Post-op Vital Signs: stable   Complications: No apparent anesthesia complications

## 2012-10-10 NOTE — Anesthesia Preprocedure Evaluation (Addendum)
Anesthesia Evaluation  Patient identified by MRN, date of birth, ID band Patient awake    Reviewed: Allergy & Precautions, H&P , NPO status , Patient's Chart, lab work & pertinent test results  Airway Mallampati: III TM Distance: >3 FB Neck ROM: Full    Dental No notable dental hx.    Pulmonary neg pulmonary ROS,  breath sounds clear to auscultation  Pulmonary exam normal       Cardiovascular hypertension, Pt. on medications negative cardio ROS  - dysrhythmias - Valvular Problems/MurmursRhythm:Regular Rate:Normal     Neuro/Psych negative neurological ROS  negative psych ROS   GI/Hepatic negative GI ROS, Neg liver ROS,   Endo/Other  negative endocrine ROSdiabetes, Type 2, Oral Hypoglycemic Agents and Insulin Dependent  Renal/GU negative Renal ROS  negative genitourinary   Musculoskeletal negative musculoskeletal ROS (+)   Abdominal   Peds negative pediatric ROS (+)  Hematology negative hematology ROS (+)   Anesthesia Other Findings   Reproductive/Obstetrics negative OB ROS                           Anesthesia Physical Anesthesia Plan  ASA: III  Anesthesia Plan: Spinal   Post-op Pain Management:    Induction:   Airway Management Planned: Simple Face Mask  Additional Equipment:   Intra-op Plan:   Post-operative Plan:   Informed Consent: I have reviewed the patients History and Physical, chart, labs and discussed the procedure including the risks, benefits and alternatives for the proposed anesthesia with the patient or authorized representative who has indicated his/her understanding and acceptance.   Dental advisory given  Plan Discussed with: CRNA  Anesthesia Plan Comments:         Anesthesia Quick Evaluation

## 2012-10-11 ENCOUNTER — Encounter (HOSPITAL_COMMUNITY): Payer: Self-pay | Admitting: Orthopedic Surgery

## 2012-10-11 LAB — GLUCOSE, CAPILLARY

## 2012-10-11 LAB — CBC
HCT: 34.2 % — ABNORMAL LOW (ref 36.0–46.0)
MCHC: 33.6 g/dL (ref 30.0–36.0)
RDW: 15 % (ref 11.5–15.5)

## 2012-10-11 LAB — BASIC METABOLIC PANEL
BUN: 12 mg/dL (ref 6–23)
GFR calc Af Amer: >90 mL/min (ref >90)
GFR calc non Af Amer: >90 mL/min (ref >90)
Potassium: 3.7 mEq/L (ref 3.5–5.1)
Sodium: 136 mEq/L (ref 135–145)

## 2012-10-11 MED ORDER — OXYCODONE HCL 5 MG PO TABS
5.0000 mg | ORAL_TABLET | ORAL | Status: DC | PRN
  Administered 2012-10-11 (×2): 15 mg via ORAL
  Administered 2012-10-11: 10 mg via ORAL
  Administered 2012-10-11 – 2012-10-12 (×8): 15 mg via ORAL
  Administered 2012-10-13: 10 mg via ORAL
  Administered 2012-10-13 (×2): 15 mg via ORAL
  Filled 2012-10-11 (×2): qty 3
  Filled 2012-10-11: qty 2
  Filled 2012-10-11 (×3): qty 3
  Filled 2012-10-11: qty 2
  Filled 2012-10-11 (×7): qty 3

## 2012-10-11 NOTE — Progress Notes (Signed)
Physical Therapy Treatment Patient Details Name: OLEVIA BUDMAN MRN: [id number] DOB: 12-13-1955 Today's Date: 10/11/2012 Time: 1335-1400 PT Time Calculation (min): 25 min  PT Assessment / Plan / Recommendation Comments on Treatment Session       Follow Up Recommendations  Home health PT     Does the patient have the potential to tolerate intense rehabilitation     Barriers to Discharge        Equipment Recommendations  Rolling walker with 5" wheels    Recommendations for Other Services OT consult  Frequency 7X/week   Plan Discharge plan remains appropriate    Precautions / Restrictions Precautions Precautions: Knee;Fall Required Braces or Orthoses: Knee Immobilizer - Right Knee Immobilizer - Right: Discontinue once straight leg raise with < 10 degree lag Restrictions Weight Bearing Restrictions: No Other Position/Activity Restrictions: WBAT   Pertinent Vitals/Pain 3/10 at rest, premed,     Mobility  Transfers Transfers: Sit to Stand;Stand to Sit Sit to Stand: 3: Mod assist;4: Min assist Stand to Sit: 3: Mod assist;4: Min assist Details for Transfer Assistance: cues for LE management and use of UEs for self assist Ambulation/Gait Ambulation/Gait Assistance: 1: +2 Total assist Ambulation/Gait: Patient Percentage: 70% Ambulation Distance (Feet): 7 Feet Assistive device: Rolling walker Ambulation/Gait Assistance Details: cues for sequence, posture, stride length and position from RW Gait Pattern: Step-to pattern;Decreased stance time - right;Decreased step length - right;Decreased step length - left Gait velocity: pt limited by pain with WB on R General Gait Details: increased time    Exercises     PT Diagnosis:    PT Problem List:   PT Treatment Interventions:     PT Goals Acute Rehab PT Goals PT Goal Formulation: With patient Time For Goal Achievement: 10/16/12 Potential to Achieve Goals: Good Pt will go Supine/Side to Sit: with supervision PT Goal:  Supine/Side to Sit - Progress: Goal set today Pt will go Sit to Supine/Side: with supervision PT Goal: Sit to Supine/Side - Progress: Goal set today Pt will go Sit to Stand: with supervision PT Goal: Sit to Stand - Progress: Goal set today Pt will go Stand to Sit: with supervision PT Goal: Stand to Sit - Progress: Goal set today Pt will Ambulate: 51 - 150 feet;with supervision;with rolling walker PT Goal: Ambulate - Progress: Goal set today Pt will Go Up / Down Stairs: 1-2 stairs;with min assist;with least restrictive assistive device PT Goal: Up/Down Stairs - Progress: Goal set today  Visit Information  Last PT Received On: 10/11/12 Assistance Needed: +2    Subjective Data  Subjective: I was dreading you coming back Patient Stated Goal: Resume previous lifestyle with decreased pain   Cognition  Overall Cognitive Status: Appears within functional limits for tasks assessed/performed Arousal/Alertness: Awake/alert Orientation Level: Appears intact for tasks assessed Behavior During Session: [facility] for tasks performed    Balance     End of Session PT - End of Session Equipment Utilized During Treatment: Right knee immobilizer Activity Tolerance: Patient limited by pain Patient left: in chair;with call bell/phone within reach Nurse Communication: Mobility status   GP     Kinsler Soeder 10/11/2012, 2:26 PM

## 2012-10-11 NOTE — Evaluation (Signed)
Physical Therapy Evaluation Patient Details Name: CHRISTIYANA NEST MRN: [id number] DOB: 1955-11-19 Today's Date: 10/11/2012 Time: 0910-0943 PT Time Calculation (min): 33 min  PT Assessment / Plan / Recommendation Clinical Impression  Pt s/p R TKR presents with decreased R LE strength/ROM and post op pain limiting functional mobility    PT Assessment  Patient needs continued PT services    Follow Up Recommendations  Home health PT    Does the patient have the potential to tolerate intense rehabilitation      Barriers to Discharge None      Equipment Recommendations  Rolling walker with 5" wheels    Recommendations for Other Services OT consult   Frequency 7X/week    Precautions / Restrictions Precautions Precautions: Knee;Fall Required Braces or Orthoses: Knee Immobilizer - Right Knee Immobilizer - Right: Discontinue once straight leg raise with < 10 degree lag Restrictions Weight Bearing Restrictions: No Other Position/Activity Restrictions: WBAT   Pertinent Vitals/Pain 3/10 at rest; 8/10 with WB; Rn providing MEDS, cold pack provided      Mobility  Bed Mobility Bed Mobility: Supine to Sit Supine to Sit: 4: Min assist Details for Bed Mobility Assistance: assist with R LE; vues for sequence Transfers Transfers: Sit to Stand;Stand to Sit Sit to Stand: 3: Mod assist;4: Min assist Stand to Sit: 3: Mod assist;4: Min assist Details for Transfer Assistance: cues for LE management and use of UEs for self assist Ambulation/Gait Ambulation/Gait Assistance: 1: +2 Total assist Ambulation/Gait: Patient Percentage: 70% Ambulation Distance (Feet): 2 Feet Assistive device: Rolling walker Ambulation/Gait Assistance Details: cues for posture, sequence, position from RW  Gait Pattern: Step-to pattern;Decreased stance time - right;Decreased step length - right;Decreased step length - left Gait velocity: pt limited by pain with WB on R    Shoulder Instructions     Exercises  Total Joint Exercises Ankle Circles/Pumps: AROM;Both;10 reps;Supine Quad Sets: AROM;Both;10 reps;Supine Heel Slides: AAROM;10 reps;Supine;Right Straight Leg Raises: AAROM;10 reps;Supine;Right   PT Diagnosis: Difficulty walking  PT Problem List: Decreased strength;Decreased range of motion;Decreased activity tolerance;Decreased mobility;Decreased knowledge of use of DME;Obesity;Pain PT Treatment Interventions: DME instruction;Gait training;Stair training;Functional mobility training;Therapeutic activities;Therapeutic exercise;Patient/family education   PT Goals Acute Rehab PT Goals PT Goal Formulation: With patient Time For Goal Achievement: 10/16/12 Potential to Achieve Goals: Good Pt will go Supine/Side to Sit: with supervision PT Goal: Supine/Side to Sit - Progress: Goal set today Pt will go Sit to Supine/Side: with supervision PT Goal: Sit to Supine/Side - Progress: Goal set today Pt will go Sit to Stand: with supervision PT Goal: Sit to Stand - Progress: Goal set today Pt will go Stand to Sit: with supervision PT Goal: Stand to Sit - Progress: Goal set today Pt will Ambulate: 51 - 150 feet;with supervision;with rolling walker PT Goal: Ambulate - Progress: Goal set today Pt will Go Up / Down Stairs: 1-2 stairs;with min assist;with least restrictive assistive device PT Goal: Up/Down Stairs - Progress: Goal set today  Visit Information  Last PT Received On: 10/11/12 Assistance Needed: +2    Subjective Data  Subjective: I had a lot of pain last night, Patient Stated Goal: Resume previous lifestyle with decreased pain   Prior Functioning  Home Living Lives With: Spouse Available Help at Discharge: Family Type of Home: House Home Access: Stairs to enter Technical brewer of Steps: 2+1 Entrance Stairs-Rails: None Home Layout: One level Home Adaptive Equipment: None Prior Function Level of Independence: Independent Able to Take Stairs?: Yes Driving:  Yes Communication Communication: No difficulties  Cognition  Overall Cognitive Status: Appears within functional limits for tasks assessed/performed Arousal/Alertness: Awake/alert Orientation Level: Appears intact for tasks assessed Behavior During Session: [facility] for tasks performed    Extremity/Trunk Assessment Right Upper Extremity Assessment RUE ROM/Strength/Tone: [facility] for tasks assessed Left Upper Extremity Assessment LUE ROM/Strength/Tone: WFL for tasks assessed Right Lower Extremity Assessment RLE ROM/Strength/Tone: Deficits RLE ROM/Strength/Tone Deficits: 2/5 quads; AAROM at knee -10 - 35 Left Lower Extremity Assessment LLE ROM/Strength/Tone: WFL for tasks assessed   Balance    End of Session PT - End of Session Equipment Utilized During Treatment: Right knee immobilizer Activity Tolerance: Patient limited by pain Patient left: in chair;with call bell/phone within reach Nurse Communication: Mobility status;Patient requests pain meds  GP     Lynnix Schoneman 10/11/2012, 10:53 AM

## 2012-10-11 NOTE — Progress Notes (Signed)
   Subjective: 1 Day Post-Op Procedure(s) (LRB): TOTAL KNEE ARTHROPLASTY (Right) Patient reports pain as moderate thru the night. Patient seen in rounds with Dr. Wynelle Link. Patient is having problems with pain in the knee, requiring pain medications We will start therapy today.  Plan is to go Home after hospital stay.  Objective: Vital signs in last 24 hours: Temp:  [97.4 F (36.3 C)-98.4 F (36.9 C)] 97.6 F (36.4 C) (01/30 0453) Pulse Rate:  [67-89] 89  (01/30 0453) Resp:  [8-20] 20  (01/30 0453) BP: (115-149)/(51-83) 144/83 mmHg (01/30 0453) SpO2:  [97 %-100 %] 97 % (01/30 0453) Weight:  [109.77 kg (242 lb)] 109.77 kg (242 lb) (01/29 1405)  Intake/Output from previous day:  Intake/Output Summary (Last 24 hours) at 10/11/12 0821 Last data filed at 10/11/12 [id number]  Gross per 24 hour  Intake   3135 ml  Output   1425 ml  Net   1710 ml    Intake/Output this shift: UOP 325 since MN +1710  Labs:  [facility] 10/11/12 0425  HGB 11.5*    Basename 10/11/12 0425  WBC 8.1  RBC 3.83*  HCT 34.2*  PLT 172    Basename 10/11/12 0425  NA 136  K 3.7  CL 98  CO2 29  BUN 12  CREATININE 0.70  GLUCOSE 190*  CALCIUM 8.7   No results found for this basename: LABPT:2,INR:2 in the last 72 hours  EXAM General - Patient is Alert, Appropriate and Oriented Extremity - Neurovascular intact Sensation intact distally Dorsiflexion/Plantar flexion intact Dressing - dressing C/D/I Motor Function - intact, moving foot and toes well on exam.  Hemovac pulled without difficulty.  Past Medical History  Diagnosis Date  . Glucose intolerance (impaired glucose tolerance)   . Osteoarthritis     rt knee  . Heart murmur     Left ventricle: The cavity size was normal. Wall thickness was  . DYSLIPIDEMIA   . OBESITY, NOS   . UNSPECIFIED LABYRINTHITIS   . ALLERGIC RHINITIS, SEASONAL   . PLANTAR FASCIITIS, BILATERAL   . CARDIAC MURMUR   . HYPERGLYCEMIA   . Exercise intolerance   . Rash     . Dizzy   . Vertigo   . Hypertension   . Dysrhythmia     palpitations-evaluated by Dr Johnsie Cancel 6/12/eccho 6/13  . Diabetes mellitus without complication   . History of blood transfusion     Assessment/Plan: 1 Day Post-Op Procedure(s) (LRB): TOTAL KNEE ARTHROPLASTY (Right) Principal Problem:  *OA (osteoarthritis) of knee  Estimated Body mass index is 44.26 kg/(m^2) as calculated from the following:   Height as of this encounter: 5\' 2" (1.575 m).   Weight as of this encounter: 242 lb(109.77 kg). Advance diet Up with therapy Discharge home with home health when ready.  DVT Prophylaxis - Xarelto, ASA 81 mg on hold Weight-Bearing as tolerated to right leg No vaccines. D/C O2 and Pulse OX and try on Room Air  [name], [place] 10/11/2012, 8:21 AM

## 2012-10-11 NOTE — Care Management Note (Signed)
  Page 2 of 2   10/11/2012     4:50:01 PM   CARE MANAGEMENT NOTE 10/11/2012  Patient:  Pamela Thomas, Pamela Thomas   Account Number:  [id number]  Date Initiated:  10/11/2012  Documentation initiated by:  Sherrin Daisy  Subjective/Objective Assessment:   DX RT TOTAL KNEE REPLACEMNT     Action/Plan:   Cm spoke with patient. Plans are for patient to return to her home in [place] where spouse, daughter and brother will be caregivers.  Will need RW and ? 3N1   Anticipated DC Date:  10/14/2012   Anticipated DC Plan:  [place] referral  NA      DC Planning Services  CM consult      [facility] Choice  [place]   Choice offered to / List presented to:  C-1 Patient        [place] arranged  HH-2 PT      [facility] agency  [facility]   Status of service:  In process, will continue to follow Medicare Important Message given?   (If response is "NO", the following Medicare IM given date fields will be blank) Date Medicare IM given:   Date Additional Medicare IM given:    Discharge Disposition:    Per UR Regulation:  Reviewed for med. necessity/level of care/duration of stay  If discussed at [place] of Stay Meetings, dates discussed:    Comments:  10/11/2012 Kem Kays RN CCM [phone] [place]; spoke with sarah in intake who advised that they can accept case and provide HHpt services. Face sheet, hh orders, op note, H&P sent to [phone] with confirmation.

## 2012-10-12 LAB — CBC
HCT: 34.1 % — ABNORMAL LOW (ref 36.0–46.0)
Hemoglobin: 11.7 g/dL — ABNORMAL LOW (ref 12.0–15.0)
MCHC: 34.3 g/dL (ref 30.0–36.0)
WBC: 11.9 10*3/uL — ABNORMAL HIGH (ref 4.0–10.5)

## 2012-10-12 LAB — GLUCOSE, CAPILLARY
Glucose-Capillary: 178 mg/dL — ABNORMAL HIGH (ref 70–99)
Glucose-Capillary: 209 mg/dL — ABNORMAL HIGH (ref 70–99)

## 2012-10-12 LAB — BASIC METABOLIC PANEL
BUN: 11 mg/dL (ref 6–23)
CO2: 29 mEq/L (ref 19–32)
Chloride: 93 mEq/L — ABNORMAL LOW (ref 96–112)
Glucose, Bld: 216 mg/dL — ABNORMAL HIGH (ref 70–99)
Potassium: 3.7 mEq/L (ref 3.5–5.1)

## 2012-10-12 MED ORDER — RIVAROXABAN 10 MG PO TABS: 10.0000 mg | ORAL_TABLET | Freq: Every day | ORAL | Status: DC

## 2012-10-12 MED ORDER — METHOCARBAMOL 500 MG PO TABS: 500.0000 mg | ORAL_TABLET | Freq: Four times a day (QID) | ORAL | Status: DC | PRN

## 2012-10-12 MED ORDER — OXYCODONE HCL 5 MG PO TABS: 5.0000 mg | ORAL_TABLET | ORAL | Status: DC | PRN

## 2012-10-12 NOTE — Progress Notes (Signed)
Physical Therapy Treatment Patient Details Name: Pamela Thomas MRN: [id number] DOB: December 19, 1955 Today's Date: 10/12/2012 Time: [id number] PT Time Calculation (min): 24 min  PT Assessment / Plan / Recommendation Comments on Treatment Session  Attempted amb this am but pt in too much pain following transfer chair to [facility].  Marked improvement in pain control this pm and marked improvement in activity tolerance    Follow Up Recommendations  Home health PT     Does the patient have the potential to tolerate intense rehabilitation     Barriers to Discharge        Equipment Recommendations  Rolling walker with 5" wheels    Recommendations for Other Services OT consult  Frequency 7X/week   Plan Discharge plan remains appropriate    Precautions / Restrictions Precautions Precautions: Knee;Fall Required Braces or Orthoses: Knee Immobilizer - Right Knee Immobilizer - Right: Discontinue once straight leg raise with < 10 degree lag Restrictions Weight Bearing Restrictions: No Other Position/Activity Restrictions: WBAT   Pertinent Vitals/Pain 3/10; premed    Mobility  Transfers Transfers: Sit to Stand;Stand to Sit Sit to Stand: 4: Min guard;With armrests;From chair/3-in-1;From toilet Stand to Sit: 4: Min assist;To chair/3-in-1;With armrests;With upper extremity assist Details for Transfer Assistance: cues for RLE and hands Ambulation/Gait Ambulation/Gait Assistance: 4: Min assist;3: Mod assist Ambulation Distance (Feet): 49 Feet Assistive device: Rolling walker Ambulation/Gait Assistance Details: cues for sequence and position from RW Gait Pattern: Step-to pattern;Decreased stance time - right;Decreased step length - right;Decreased step length - left General Gait Details: increased time    Exercises     PT Diagnosis:    PT Problem List:   PT Treatment Interventions:     PT Goals Acute Rehab PT Goals PT Goal Formulation: With patient Time For Goal Achievement:  10/16/12 Potential to Achieve Goals: Good Pt will go Supine/Side to Sit: with supervision Pt will go Sit to Supine/Side: with supervision Pt will go Sit to Stand: with supervision PT Goal: Sit to Stand - Progress: Progressing toward goal Pt will go Stand to Sit: with supervision PT Goal: Stand to Sit - Progress: Progressing toward goal Pt will Ambulate: 51 - 150 feet;with supervision;with rolling walker PT Goal: Ambulate - Progress: Progressing toward goal Pt will Go Up / Down Stairs: 1-2 stairs;with min assist;with least restrictive assistive device  Visit Information  Last PT Received On: 10/12/12 Assistance Needed: +1    Subjective Data  Subjective: I think I'm doing better and lets walk Patient Stated Goal: Resume previous lifestyle with decreased pain   Cognition  Overall Cognitive Status: Appears within functional limits for tasks assessed/performed Arousal/Alertness: Awake/alert Orientation Level: Appears intact for tasks assessed Behavior During Session: [facility] for tasks performed    Balance     End of Session PT - End of Session Equipment Utilized During Treatment: Right knee immobilizer Activity Tolerance: Patient tolerated treatment well Patient left: in chair;with call bell/phone within reach Nurse Communication: Mobility status   GP     Ashima Shrake 10/12/2012, 5:01 PM

## 2012-10-12 NOTE — Evaluation (Signed)
Occupational Therapy Evaluation Patient Details Name: Pamela Thomas MRN: [id number] DOB: [birth year] Today's Date: 10/12/2012 Time: [id number] OT Time Calculation (min): 21 min  OT Assessment / Plan / Recommendation Clinical Impression  This 57 year old female was admitted for R TKA.  All education was completed.  Pt does not need any further OT.      OT Assessment  Patient does not need any further OT services    Follow Up Recommendations  No OT follow up    Barriers to Discharge      Equipment Recommendations  3 in 1 bedside comode    Recommendations for Other Services    Frequency       Precautions / Restrictions Precautions Precautions: Knee;Fall Required Braces or Orthoses: Knee Immobilizer - Right Knee Immobilizer - Right: Discontinue once straight leg raise with < 10 degree lag Restrictions Weight Bearing Restrictions: No Other Position/Activity Restrictions: WBAT   Pertinent Vitals/Pain Occasional stabbing pain right shin    ADL  Toilet Transfer: Performed;Min guard (vcs to extend R leg and for hand placement) Toilet Transfer Method: Stand pivot Toilet Transfer Equipment: Bedside commode (has urgency) Toileting - Clothing Manipulation and Hygiene: Performed;+1 Total assistance Where Assessed - Toileting Clothing Manipulation and Hygiene: Standing ADL Comments: educated on toilet aid and tub transfer bench and resources given.  She will have help with adls but wants to be independent with hygiene.      OT Diagnosis:    OT Problem List:   OT Treatment Interventions:     OT Goals    Visit Information  Last OT Received On: 10/12/12 Assistance Needed: +1    Subjective Data  Subjective: I didn't know I'd need my arms so much   Prior Functioning     Home Living Lives With: Spouse Available Help at Discharge: Family Type of Home: House Home Access: Stairs to enter Technical brewer of Steps: 2+1 Entrance Stairs-Rails: None Home Layout: One  level Bathroom Shower/Tub: Public relations account executive: None Prior Function Level of Independence: Independent Able to Take Stairs?: Yes Driving: Yes Communication Communication: No difficulties         Vision/Perception     Cognition  Overall Cognitive Status: Appears within functional limits for tasks assessed/performed Arousal/Alertness: Awake/alert Orientation Level: Appears intact for tasks assessed Behavior During Session: [facility] for tasks performed    Extremity/Trunk Assessment Right Upper Extremity Assessment RUE ROM/Strength/Tone: [facility] for tasks assessed Left Upper Extremity Assessment LUE ROM/Strength/Tone: WFL for tasks assessed     Mobility Transfers Sit to Stand: 4: Min guard;With armrests;From chair/3-in-1;From toilet Details for Transfer Assistance: cues for RLE and hands     Shoulder Instructions     Exercise     Balance     End of Session OT - End of Session Activity Tolerance: Patient tolerated treatment well Patient left: in chair;with call bell/phone within reach  [place] 10/12/2012, 10:17 AM Lesle Chris, OTR/L [phone] 10/12/2012

## 2012-10-12 NOTE — Discharge Summary (Signed)
Physician Discharge Summary   Patient ID: Pamela Thomas MRN: [id number] DOB/AGE: 58-Apr-1957 58 y.o.  Admit date: 10/10/2012 Discharge date: 10/13/2012  Primary Diagnosis:  Osteoarthritis Right knee  Admission Diagnoses:  Past Medical History  Diagnosis Date  . Glucose intolerance (impaired glucose tolerance)   . Osteoarthritis     rt knee  . Heart murmur     Left ventricle: The cavity size was normal. Wall thickness was  . DYSLIPIDEMIA   . OBESITY, NOS   . UNSPECIFIED LABYRINTHITIS   . ALLERGIC RHINITIS, SEASONAL   . PLANTAR FASCIITIS, BILATERAL   . CARDIAC MURMUR   . HYPERGLYCEMIA   . Exercise intolerance   . Rash   . Dizzy   . Vertigo   . Hypertension   . Dysrhythmia     palpitations-evaluated by Dr Johnsie Cancel 6/12/eccho 6/13  . Diabetes mellitus without complication   . History of blood transfusion    Discharge Diagnoses:   Principal Problem:  *OA (osteoarthritis) of knee  Estimated Body mass index is 44.26 kg/(m^2) as calculated from the following:   Height as of this encounter: 5\' 2" (1.575 m).   Weight as of this encounter: 242 lb(109.77 kg).  Classification of overweight in adults according to BMI (WHO, 1998)   Procedure:  Procedure(s) (LRB): TOTAL KNEE ARTHROPLASTY (Right)   Consults: None  HPI: Pamela Thomas is a 57 y.o. year old female with end stage OA of her right knee with progressively worsening pain and dysfunction. She has constant pain, with activity and at rest and significant functional deficits with difficulties even with ADLs. She has had extensive non-op management including analgesics, injections of cortisone and viscosupplements, and home exercise program, but remains in significant pain with significant dysfunction.Radiographs show bone on bone arthritis medial and patellofemoral with varus deformity.. She presents now for right Total Knee Arthroplasty.   Laboratory Data: Admission on 10/10/2012  Component Date Value Range Status  .  ABO/RH(D) 10/10/2012 AB NEG   Final  . Antibody Screen 10/10/2012 NEG   Final  . Sample Expiration 10/10/2012 10/13/2012   Final  . Glucose-Capillary 10/10/2012 199* 70 - 99 mg/dL Final  . Comment 1 10/10/2012 Documented in Chart   Final  . ABO/RH(D) 10/10/2012 AB NEG   Final  . Glucose-Capillary 10/10/2012 153* 70 - 99 mg/dL Final  . Comment 1 10/10/2012 Documented in Chart   Final  . Comment 2 10/10/2012 Notify RN   Final  . Glucose-Capillary 10/10/2012 212* 70 - 99 mg/dL Final  . WBC 10/11/2012 8.1  4.0 - 10.5 K/uL Final  . RBC 10/11/2012 3.83* 3.87 - 5.11 MIL/uL Final  . Hemoglobin 10/11/2012 11.5* 12.0 - 15.0 g/dL Final  . HCT 10/11/2012 34.2* 36.0 - 46.0 % Final  . MCV 10/11/2012 89.3  78.0 - 100.0 fL Final  . MCH 10/11/2012 30.0  26.0 - 34.0 pg Final  . MCHC 10/11/2012 33.6  30.0 - 36.0 g/dL Final  . RDW 10/11/2012 15.0  11.5 - 15.5 % Final  . Platelets 10/11/2012 172  150 - 400 K/uL Final  . Sodium 10/11/2012 136  135 - 145 mEq/L Final  . Potassium 10/11/2012 3.7  3.5 - 5.1 mEq/L Final  . Chloride 10/11/2012 98  96 - 112 mEq/L Final  . CO2 10/11/2012 29  19 - 32 mEq/L Final  . Glucose, Bld 10/11/2012 190* 70 - 99 mg/dL Final  . BUN 10/11/2012 12  6 - 23 mg/dL Final  . Creatinine, Ser 10/11/2012 0.70  0.50 -  1.10 mg/dL Final  . Calcium 10/11/2012 8.7  8.4 - 10.5 mg/dL Final  . GFR calc non Af Amer 10/11/2012 >90  >90 mL/min Final  . GFR calc Af Amer 10/11/2012 >90  >90 mL/min Final   Comment:                                 The eGFR has been calculated                          using the CKD EPI equation.                          This calculation has not been                          validated in all clinical                          situations.                          eGFR's persistently                          <90 mL/min signify                          possible Chronic Kidney Disease.  . Glucose-Capillary 10/10/2012 184* 70 - 99 mg/dL Final  . Glucose-Capillary  10/11/2012 191* 70 - 99 mg/dL Final  . Comment 1 10/11/2012 Notify RN   Final  . Comment 2 10/11/2012 Documented in Chart   Final  . Glucose-Capillary 10/11/2012 197* 70 - 99 mg/dL Final  . Comment 1 10/11/2012 Notify RN   Final  . Comment 2 10/11/2012 Documented in Chart   Final  . WBC 10/12/2012 11.9* 4.0 - 10.5 K/uL Final  . RBC 10/12/2012 3.86* 3.87 - 5.11 MIL/uL Final  . Hemoglobin 10/12/2012 11.7* 12.0 - 15.0 g/dL Final  . HCT 10/12/2012 34.1* 36.0 - 46.0 % Final  . MCV 10/12/2012 88.3  78.0 - 100.0 fL Final  . MCH 10/12/2012 30.3  26.0 - 34.0 pg Final  . MCHC 10/12/2012 34.3  30.0 - 36.0 g/dL Final  . RDW 10/12/2012 14.8  11.5 - 15.5 % Final  . Platelets 10/12/2012 205  150 - 400 K/uL Final  . Sodium 10/12/2012 132* 135 - 145 mEq/L Final  . Potassium 10/12/2012 3.7  3.5 - 5.1 mEq/L Final  . Chloride 10/12/2012 93* 96 - 112 mEq/L Final  . CO2 10/12/2012 29  19 - 32 mEq/L Final  . Glucose, Bld 10/12/2012 216* 70 - 99 mg/dL Final  . BUN 10/12/2012 11  6 - 23 mg/dL Final  . Creatinine, Ser 10/12/2012 0.69  0.50 - 1.10 mg/dL Final  . Calcium 10/12/2012 8.9  8.4 - 10.5 mg/dL Final  . GFR calc non Af Amer 10/12/2012 >90  >90 mL/min Final  . GFR calc Af Amer 10/12/2012 >90  >90 mL/min Final   Comment:                                 The eGFR has been calculated  using the CKD EPI equation.                          This calculation has not been                          validated in all clinical                          situations.                          eGFR's persistently                          <90 mL/min signify                          possible Chronic Kidney Disease.  . Glucose-Capillary 10/11/2012 246* 70 - 99 mg/dL Final  . Comment 1 10/11/2012 Notify RN   Final  . Comment 2 10/11/2012 Documented in Chart   Final  . Glucose-Capillary 10/12/2012 178* 70 - 99 mg/dL Final  . Comment 1 10/12/2012 Notify RN   Final  . Comment 2 10/12/2012 Documented  in [facility] Outpatient Visit on 10/05/2012  Component Date Value Range Status  . aPTT 10/05/2012 29  24 - 37 seconds Final  . WBC 10/05/2012 7.5  4.0 - 10.5 K/uL Final  . RBC 10/05/2012 4.30  3.87 - 5.11 MIL/uL Final  . Hemoglobin 10/05/2012 13.1  12.0 - 15.0 g/dL Final  . HCT 10/05/2012 38.4  36.0 - 46.0 % Final  . MCV 10/05/2012 89.3  78.0 - 100.0 fL Final  . MCH 10/05/2012 30.5  26.0 - 34.0 pg Final  . MCHC 10/05/2012 34.1  30.0 - 36.0 g/dL Final  . RDW 10/05/2012 15.2  11.5 - 15.5 % Final  . Platelets 10/05/2012 242  150 - 400 K/uL Final  . Sodium 10/05/2012 134* 135 - 145 mEq/L Final  . Potassium 10/05/2012 3.6  3.5 - 5.1 mEq/L Final  . Chloride 10/05/2012 95* 96 - 112 mEq/L Final  . CO2 10/05/2012 29  19 - 32 mEq/L Final  . Glucose, Bld 10/05/2012 224* 70 - 99 mg/dL Final  . BUN 10/05/2012 16  6 - 23 mg/dL Final  . Creatinine, Ser 10/05/2012 0.97  0.50 - 1.10 mg/dL Final  . Calcium 10/05/2012 9.8  8.4 - 10.5 mg/dL Final  . Total Protein 10/05/2012 7.3  6.0 - 8.3 g/dL Final  . Albumin 10/05/2012 3.7  3.5 - 5.2 g/dL Final  . AST 10/05/2012 28  0 - 37 U/L Final  . ALT 10/05/2012 24  0 - 35 U/L Final  . Alkaline Phosphatase 10/05/2012 71  39 - 117 U/L Final  . Total Bilirubin 10/05/2012 0.3  0.3 - 1.2 mg/dL Final  . GFR calc non Af Amer 10/05/2012 64* >90 mL/min Final  . GFR calc Af Amer 10/05/2012 74* >90 mL/min Final   Comment:                                 The eGFR has been calculated  using the CKD EPI equation.                          This calculation has not been                          validated in all clinical                          situations.                          eGFR's persistently                          <90 mL/min signify                          possible Chronic Kidney Disease.  Marland Kitchen Prothrombin Time 10/05/2012 12.6  11.6 - 15.2 seconds Final  . INR 10/05/2012 0.95  0.00 - 1.49 Final  . Color, Urine 10/05/2012  YELLOW  YELLOW Final  . APPearance 10/05/2012 CLEAR  CLEAR Final  . Specific Gravity, Urine 10/05/2012 1.015  1.005 - 1.030 Final  . pH 10/05/2012 5.5  5.0 - 8.0 Final  . Glucose, UA 10/05/2012 NEGATIVE  NEGATIVE mg/dL Final  . Hgb urine dipstick 10/05/2012 NEGATIVE  NEGATIVE Final  . Bilirubin Urine 10/05/2012 NEGATIVE  NEGATIVE Final  . Ketones, ur 10/05/2012 NEGATIVE  NEGATIVE mg/dL Final  . Protein, ur 10/05/2012 NEGATIVE  NEGATIVE mg/dL Final  . Urobilinogen, UA 10/05/2012 0.2  0.0 - 1.0 mg/dL Final  . Nitrite 10/05/2012 NEGATIVE  NEGATIVE Final  . Leukocytes, UA 10/05/2012 NEGATIVE  NEGATIVE Final   MICROSCOPIC NOT DONE ON URINES WITH NEGATIVE PROTEIN, BLOOD, LEUKOCYTES, NITRITE, OR GLUCOSE <1000 mg/dL.  Marland Kitchen MRSA, PCR 10/05/2012 NEGATIVE  NEGATIVE Final  . Staphylococcus aureus 10/05/2012 NEGATIVE  NEGATIVE Final   Comment:                                 The Xpert SA Assay (FDA                          approved for NASAL specimens                          in patients over 66 years of age),                          is one component of                          a comprehensive surveillance                          program.  Test performance has                          been validated by [employer] for patients greater  than or equal to 65 year old.                          It is not intended                          to diagnose infection nor to                          guide or monitor treatment.     X-Rays:Dg Chest 2 View  10/05/2012  *RADIOLOGY REPORT*  Clinical Data: Preop right knee of 3+  CHEST - 2 VIEW  Comparison: None.  Findings: Normal cardiac silhouette ectatic aorta.   No effusion, infiltrate, or pneumothorax. The there is focal angulation of the mid thoracic spine which likely represents hemivertebra anatomy.  IMPRESSION: 1.  No acute cardiopulmonary findings. 2.  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   Original Report Authenticated By: Suzy Bouchard, M.D.     EKG: Orders placed during the hospital encounter of 10/05/12  . EKG 12-LEAD  . EKG 12-LEAD     Hospital Course: Pamela Thomas is a 57 y.o. who was admitted to [facility]. They were brought to the operating room on 10/10/2012 and underwent Procedure(s): TOTAL KNEE ARTHROPLASTY.  Patient tolerated the procedure well and was later transferred to the recovery room and then to the orthopaedic floor for postoperative care.  They were given PO and IV analgesics for pain control following their surgery.  They were given 24 hours of postoperative antibiotics of  Anti-infectives     Start     Dose/Rate Route Frequency Ordered Stop   10/10/12 1600   ceFAZolin (ANCEF) IVPB 2 g/50 mL premix        2 g 100 mL/hr over 30 Minutes Intravenous Every 6 hours 10/10/12 1412 10/10/12 2241   10/10/12 0600   ceFAZolin (ANCEF) IVPB 2 g/50 mL premix        2 g 100 mL/hr over 30 Minutes Intravenous On call to O.R. 10/09/12 1719 10/10/12 0930         and started on DVT prophylaxis in the form of Xarelto.   PT and OT were ordered for total joint protocol.  Discharge planning consulted to help with postop disposition and equipment needs.  Patient had a rough night on the evening of surgery with pain but started to get up OOB with therapy on day one. Hemovac drain was pulled without difficulty.  Continued to work with therapy into day two showing some progress walking over 40 feet.  Dressing was changed on day two and the incision was healing well.  By day three, the patient had progressed with therapy and meeting their goals.  Incision was healing well.  Patient was seen in rounds and was ready to go home.   Discharge Medications: Prior to Admission medications   Medication Sig Start Date End Date Taking? Authorizing Provider  acetaminophen (TYLENOL) 500 MG tablet Take 1,000 mg by mouth every 6 (six) hours as needed. PAIN   Yes  Historical Provider, MD  furosemide (LASIX) 20 MG tablet Take 20 mg by mouth daily before breakfast.   Yes Historical Provider, MD  KLOR-CON M20 20 MEQ tablet 1 BY MOUTH ONCE DAILY 12/01/11  Yes Dickie La, MD  losartan (COZAAR) 100 MG tablet Take 100 mg by mouth daily before breakfast.  Yes Historical Provider, MD  metFORMIN (GLUCOPHAGE) 500 MG tablet Take 500 mg by mouth daily with breakfast.   Yes Historical Provider, MD  metoprolol succinate (TOPROL-XL) 100 MG 24 hr tablet Take 200 mg by mouth daily before breakfast. Take with or immediately following a meal.   Yes Historical Provider, MD  traMADol (ULTRAM) 50 MG tablet Take 100 mg by mouth at bedtime.   Yes Historical Provider, MD  triamterene-hydrochlorothiazide (DYAZIDE) 37.5-25 MG per capsule Take 1 capsule by mouth daily before breakfast.   Yes Historical Provider, MD  methocarbamol (ROBAXIN) 500 MG tablet Take 1 tablet (500 mg total) by mouth every 6 (six) hours as needed. 10/12/12   Ronee Ranganathan, PA  oxyCODONE (OXY IR/ROXICODONE) 5 MG immediate release tablet Take 1-3 tablets (5-15 mg total) by mouth every 3 (three) hours as needed. 10/12/12   Sherece Gambrill Dara Lords, PA  rivaroxaban (XARELTO) 10 MG TABS tablet Take 1 tablet (10 mg total) by mouth daily with breakfast. Take Xarelto for two and a half more weeks, then discontinue Xarelto. Once the patient has completed the Xarelto, they may resume the 81 mg Aspirin. 10/12/12   Dierks Wach, PA    Diet: Cardiac diet Activity:WBAT Follow-up:in 2 weeks Disposition - Home Discharged Condition: good   Discharge Orders    Future Orders Please Complete By Expires   Diet - low sodium heart healthy      Call MD / Call 911      Comments:   If you experience chest pain or shortness of breath, CALL 911 and be transported to the hospital emergency room.  If you develope a fever above 101 F, pus (white drainage) or increased drainage or redness at the wound, or calf pain, call your  surgeon's office.   Discharge instructions      Comments:   Pick up stool softner and laxative for home. Do not submerge incision under water. May shower. Continue to use ice for pain and swelling from surgery.  Take Xarelto for two and a half more weeks, then discontinue Xarelto. Once the patient has completed the Xarelto, they may resume the 81 mg Aspirin.   Constipation Prevention      Comments:   Drink plenty of fluids.  Prune juice may be helpful.  You may use a stool softener, such as Colace (over the counter) 100 mg twice a day.  Use MiraLax (over the counter) for constipation as needed.   Increase activity slowly as tolerated      Patient may shower      Comments:   You may shower without a dressing once there is no drainage.  Do not wash over the wound.  If drainage remains, do not shower until drainage stops.   Weight bearing as tolerated      Driving restrictions      Comments:   No driving until released by the physician.   Lifting restrictions      Comments:   No lifting until released by the physician.   TED hose      Comments:   Use stockings (TED hose) for 3 weeks on both leg(s).  You may remove them at night for sleeping.   Change dressing      Comments:   Change dressing daily with sterile 4 x 4 inch gauze dressing and apply TED hose. Do not submerge the incision under water.   Do not put a pillow under the knee. Place it under the heel.      Do  not sit on low chairs, stoools or toilet seats, as it may be difficult to get up from low surfaces          Medication List     As of 10/12/2012  9:11 AM    STOP taking these medications         aspirin 81 MG tablet      ibuprofen 200 MG tablet   Commonly known as: ADVIL,MOTRIN      meloxicam 7.5 MG tablet   Commonly known as: MOBIC      TAKE these medications         acetaminophen 500 MG tablet   Commonly known as: TYLENOL   Take 1,000 mg by mouth every 6 (six) hours as needed. PAIN      furosemide 20  MG tablet   Commonly known as: LASIX   Take 20 mg by mouth daily before breakfast.      KLOR-CON M20 20 MEQ tablet   Generic drug: potassium chloride SA   1 BY MOUTH ONCE DAILY      losartan 100 MG tablet   Commonly known as: COZAAR   Take 100 mg by mouth daily before breakfast.      metFORMIN 500 MG tablet   Commonly known as: GLUCOPHAGE   Take 500 mg by mouth daily with breakfast.      methocarbamol 500 MG tablet   Commonly known as: ROBAXIN   Take 1 tablet (500 mg total) by mouth every 6 (six) hours as needed.      metoprolol succinate 100 MG 24 hr tablet   Commonly known as: TOPROL-XL   Take 200 mg by mouth daily before breakfast. Take with or immediately following a meal.      oxyCODONE 5 MG immediate release tablet   Commonly known as: Oxy IR/ROXICODONE   Take 1-3 tablets (5-15 mg total) by mouth every 3 (three) hours as needed.      rivaroxaban 10 MG Tabs tablet   Commonly known as: XARELTO   Take 1 tablet (10 mg total) by mouth daily with breakfast. Take Xarelto for two and a half more weeks, then discontinue Xarelto.  Once the patient has completed the Xarelto, they may resume the 81 mg Aspirin.      traMADol 50 MG tablet   Commonly known as: ULTRAM   Take 100 mg by mouth at bedtime.      triamterene-hydrochlorothiazide 37.5-25 MG per capsule   Commonly known as: DYAZIDE   Take 1 capsule by mouth daily before breakfast.           Follow-up Information    Follow up with Gearlean Alf, MD. Schedule an appointment as soon as possible for a visit in 2 weeks.   Contact information:   [address] [place] [address] [id number]          Signed: Mickel Crow 10/12/2012, 9:11 AM

## 2012-10-12 NOTE — Progress Notes (Signed)
CARE MANAGEMENT NOTE 10/12/2012  Patient:  Pamela Thomas, Pamela Thomas   Account Number:  [id number]  Date Initiated:  10/11/2012  Documentation initiated by:  Sherrin Daisy  Subjective/Objective Assessment:   DX RT TOTAL KNEE REPLACEMNT     Action/Plan:   Cm spoke with patient. Plans are for patient to return to her home in [email or web address] where spouse, daughter and brother will be caregivers.  Will need RW and ? 3N1   Anticipated DC Date:  10/14/2012   Anticipated DC Plan:  [place]  In-house referral  NA      DC Planning Services  CM consult      [facility] Choice  [place]   Choice offered to / List presented to:  C-1 Patient   DME arranged  [place]      DME agency  [place] arranged  [place] agency  [facility]   Status of service:  In process, will continue to follow Medicare Important Message given?   (If response is "NO", the following Medicare IM given date fields will be blank) Date Medicare IM given:   Date Additional Medicare IM given:    Discharge Disposition:    Per UR Regulation:  Reviewed for med. necessity/level of care/duration of stay  If discussed at [place] of Stay Meetings, dates discussed:    Comments:  10/12/2012 Sherrin Daisy BSN RN CCM [phone] [facility] can provide [facility] services with start date of 10/15/2012. Pt given contact information for [facility]. Pt is requesting 3n1- and RW,; [facility] [employer] rep notified.

## 2012-10-12 NOTE — Progress Notes (Signed)
Physical Therapy Treatment Patient Details Name: Pamela Thomas MRN: [id number] DOB: 11-Aug-1956 Today's Date: 10/12/2012 Time: [id number] PT Time Calculation (min): 24 min  PT Assessment / Plan / Recommendation Comments on Treatment Session  Pt requests rest break between ther ex and amb    Follow Up Recommendations  Home health PT     Does the patient have the potential to tolerate intense rehabilitation     Barriers to Discharge        Equipment Recommendations  Rolling walker with 5" wheels    Recommendations for Other Services OT consult  Frequency 7X/week   Plan Discharge plan remains appropriate    Precautions / Restrictions Precautions Precautions: Knee;Fall Required Braces or Orthoses: Knee Immobilizer - Right Knee Immobilizer - Right: Discontinue once straight leg raise with < 10 degree lag Restrictions Weight Bearing Restrictions: No Other Position/Activity Restrictions: WBAT   Pertinent Vitals/Pain 4/10; premed, cold packs provided    Mobility  Transfers Sit to Stand: 4: Min guard;With armrests;From chair/3-in-1;From toilet Details for Transfer Assistance: cues for RLE and hands    Exercises Total Joint Exercises Ankle Circles/Pumps: AROM;Both;Supine;20 reps Quad Sets: AROM;Both;Supine;20 reps Heel Slides: AAROM;Supine;Right;20 reps Straight Leg Raises: AAROM;Supine;Right;20 reps   PT Diagnosis:    PT Problem List:   PT Treatment Interventions:     PT Goals Acute Rehab PT Goals PT Goal Formulation: With patient Time For Goal Achievement: 10/16/12 Potential to Achieve Goals: Good Pt will go Supine/Side to Sit: with supervision Pt will go Sit to Supine/Side: with supervision Pt will go Sit to Stand: with supervision Pt will go Stand to Sit: with supervision Pt will Ambulate: 51 - 150 feet;with supervision;with rolling walker Pt will Go Up / Down Stairs: 1-2 stairs;with min assist;with least restrictive assistive device  Visit Information  Last PT Received On: 10/12/12 Assistance Needed: +1    Subjective Data  Subjective: I'm doing better, just so sleepy from the medicine Patient Stated Goal: Resume previous lifestyle with decreased pain   Cognition  Overall Cognitive Status: Appears within functional limits for tasks assessed/performed Arousal/Alertness: Awake/alert Orientation Level: Appears intact for tasks assessed Behavior During Session: [facility] for tasks performed    Balance     End of Session PT - End of Session Equipment Utilized During Treatment: Right knee immobilizer Activity Tolerance: Patient limited by pain Patient left: in chair;with call bell/phone within reach Nurse Communication: Mobility status   GP     Pamela Thomas 10/12/2012, 12:33 PM

## 2012-10-12 NOTE — Progress Notes (Signed)
   Subjective: 2 Days Post-Op Procedure(s) (LRB): TOTAL KNEE ARTHROPLASTY (Right) Patient reports pain as mild today and better than yesterday. Patient seen in rounds with Dr. Wynelle Link. Patient is well, and has had no acute complaints or problems Patient is ready to go home if she meets all her goals, otherwise, it will be tomorrow.  Objective: Vital signs in last 24 hours: Temp:  [98.4 F (36.9 C)-98.8 F (37.1 C)] 98.4 F (36.9 C) (01/31 0656) Pulse Rate:  [81-95] 95  (01/31 0656) Resp:  [16-18] 16  (01/31 0656) BP: (115-152)/(70-81) 115/70 mmHg (01/31 0656) SpO2:  [93 %-98 %] 93 % (01/31 0656)  Intake/Output from previous day:  Intake/Output Summary (Last 24 hours) at 10/12/12 0849 Last data filed at 10/12/12 0700  Gross per 24 hour  Intake 1801.25 ml  Output   3200 ml  Net -1398.75 ml    Intake/Output this shift:    Labs:  Basename 10/12/12 0420 10/11/12 0425  HGB 11.7* 11.5*    Basename 10/12/12 0420 10/11/12 0425  WBC 11.9* 8.1  RBC 3.86* 3.83*  HCT 34.1* 34.2*  PLT 205 172    Basename 10/12/12 0420 10/11/12 0425  NA 132* 136  K 3.7 3.7  CL 93* 98  CO2 29 29  BUN 11 12  CREATININE 0.69 0.70  GLUCOSE 216* 190*  CALCIUM 8.9 8.7   No results found for this basename: LABPT:2,INR:2 in the last 72 hours  EXAM: General - Patient is Alert, Appropriate and Oriented Extremity - Neurovascular intact Sensation intact distally Dorsiflexion/Plantar flexion intact No cellulitis present Incision - clean, dry, no drainage, healing Motor Function - intact, moving foot and toes well on exam.   Assessment/Plan: 2 Days Post-Op Procedure(s) (LRB): TOTAL KNEE ARTHROPLASTY (Right) Procedure(s) (LRB): TOTAL KNEE ARTHROPLASTY (Right) Past Medical History  Diagnosis Date  . Glucose intolerance (impaired glucose tolerance)   . Osteoarthritis     rt knee  . Heart murmur     Left ventricle: The cavity size was normal. Wall thickness was  . DYSLIPIDEMIA   .  OBESITY, NOS   . UNSPECIFIED LABYRINTHITIS   . ALLERGIC RHINITIS, SEASONAL   . PLANTAR FASCIITIS, BILATERAL   . CARDIAC MURMUR   . HYPERGLYCEMIA   . Exercise intolerance   . Rash   . Dizzy   . Vertigo   . Hypertension   . Dysrhythmia     palpitations-evaluated by Dr Johnsie Cancel 6/12/eccho 6/13  . Diabetes mellitus without complication   . History of blood transfusion    Principal Problem:  *OA (osteoarthritis) of knee  Estimated Body mass index is 44.26 kg/(m^2) as calculated from the following:   Height as of this encounter: 5\' 2" (1.575 m).   Weight as of this encounter: 242 lb(109.77 kg). Up with therapy Discharge home with home health if the patient meets all her goals with therapy Diet - Cardiac diet Follow up - in 2 weeks Activity - WBAT Disposition - Home Condition Upon Discharge - Pending at this time. Home if improved with therapy. D/C Meds - See DC Summary DVT Prophylaxis - Xarelto  PERKINS, ALEXZANDREW 10/12/2012, 8:49 AM

## 2012-10-13 LAB — GLUCOSE, CAPILLARY
Glucose-Capillary: 211 mg/dL — ABNORMAL HIGH (ref 70–99)
Glucose-Capillary: 158 mg/dL — ABNORMAL HIGH (ref 70–99)

## 2012-10-13 LAB — CBC
HCT: 34.3 % — ABNORMAL LOW (ref 36.0–46.0)
Hemoglobin: 11.2 g/dL — ABNORMAL LOW (ref 12.0–15.0)
MCHC: 32.7 g/dL (ref 30.0–36.0)
RDW: 15.1 % (ref 11.5–15.5)
WBC: 11.4 10*3/uL — ABNORMAL HIGH (ref 4.0–10.5)

## 2012-10-13 NOTE — Progress Notes (Signed)
Physical Therapy Treatment Patient Details Name: Pamela Thomas MRN: [id number] DOB: 1956-02-22 Today's Date: 10/13/2012 Time: [id number] PT Time Calculation (min): 27 min  PT Assessment / Plan / Recommendation Comments on Treatment Session  Pt progressing very slowly and demonstrates increased fatigue and SOB during session.  RN notified.  Feel that pt would benefit from another day of therapy to increase safety as PT discussed ST SNF with pt and she declines.      Follow Up Recommendations  Home health PT     Does the patient have the potential to tolerate intense rehabilitation     Barriers to Discharge        Equipment Recommendations  Rolling walker with 5" wheels    Recommendations for Other Services OT consult  Frequency 7X/week   Plan Discharge plan remains appropriate    Precautions / Restrictions Precautions Precautions: Knee;Fall Required Braces or Orthoses: Knee Immobilizer - Right Knee Immobilizer - Right: Discontinue once straight leg raise with < 10 degree lag Restrictions Weight Bearing Restrictions: No Other Position/Activity Restrictions: WBAT   Pertinent Vitals/Pain 5/10    Mobility  Bed Mobility Bed Mobility: Not assessed Transfers Transfers: Sit to Stand;Stand to Sit Sit to Stand: 5: Supervision;4: Min guard;With armrests;From chair/3-in-1 Stand to Sit: 4: Min guard;4: Min assist;With armrests;To chair/3-in-1 Details for Transfer Assistance: Performed x 2 in order to use 3in1 in restroom.  Cues for hand placement, safety and technique.  Ambulation/Gait Ambulation/Gait Assistance: 4: Min assist;3: Mod assist Ambulation Distance (Feet): 15 Feet (then another 28') Assistive device: Rolling walker Ambulation/Gait Assistance Details: Pt becomes very fatigued and noted increased SOB with very little activity.  Requires several standing rest breaks and she also states that she is scared.  Cues for sequencing/technique with RW, upright posture and pursed  lip breathing.  HR 116 and SaO2 96% after ambulation.  Gait Pattern: Step-to pattern;Decreased stance time - right;Decreased step length - right;Decreased step length - left Gait velocity: pt limited by pain with WB on R General Gait Details: increased time    Exercises     PT Diagnosis:    PT Problem List:   PT Treatment Interventions:     PT Goals Acute Rehab PT Goals PT Goal Formulation: With patient Time For Goal Achievement: 10/16/12 Potential to Achieve Goals: Good Pt will go Sit to Stand: with modified independence PT Goal: Sit to Stand - Progress: Updated due to goal met Pt will go Stand to Sit: with supervision PT Goal: Stand to Sit - Progress: Progressing toward goal Pt will Ambulate: 51 - 150 feet;with supervision;with rolling walker PT Goal: Ambulate - Progress: Progressing toward goal  Visit Information  Last PT Received On: 10/13/12 Assistance Needed: +1    Subjective Data  Subjective: I don't know if my family knows what they are signing up for but I don't want to go to a rehab facility.  Patient Stated Goal: Resume previous lifestyle with decreased pain   Cognition  Overall Cognitive Status: Appears within functional limits for tasks assessed/performed Arousal/Alertness: Awake/alert Orientation Level: Appears intact for tasks assessed Behavior During Session: [facility] for tasks performed    Balance     End of Session PT - End of Session Equipment Utilized During Treatment: Right knee immobilizer Activity Tolerance: Patient limited by fatigue;Patient limited by pain Patient left: in chair;with call bell/phone within reach Nurse Communication: Mobility status   GP     Denice Bors 10/13/2012, 10:57 AM

## 2012-10-13 NOTE — Progress Notes (Signed)
Subjective: 3 Days Post-Op Procedure(s) (LRB): TOTAL KNEE ARTHROPLASTY (Right) Patient reports pain as moderate.  Reports being less sleepy today. No Chest pain SOB dizziness or lightheadedness. Reports drowsiness improved with decreased pain med.  Objective: Vital signs in last 24 hours: Temp:  [98.1 F (36.7 C)-99.1 F (37.3 C)] 98.1 F (36.7 C) (02/01 0530) Pulse Rate:  [75-86] 86  (02/01 0530) Resp:  [16-18] 18  (02/01 0530) BP: (115-160)/(68-82) 160/82 mmHg (02/01 0530) SpO2:  [93 %-97 %] 93 % (02/01 0530)  Intake/Output from previous day: 01/31 0701 - 02/01 0700 In: 960 [P.O.:960] Out: 1300 [Urine:1300] Intake/Output this shift: Total I/O In: 240 [P.O.:240] Out: -    Basename 10/13/12 0421 10/12/12 0420 10/11/12 0425  HGB 11.2* 11.7* 11.5*    Basename 10/13/12 0421 10/12/12 0420  WBC 11.4* 11.9*  RBC 3.80* 3.86*  HCT 34.3* 34.1*  PLT 210 205    Basename 10/12/12 0420 10/11/12 0425  NA 132* 136  K 3.7 3.7  CL 93* 98  CO2 29 29  BUN 11 12  CREATININE 0.69 0.70  GLUCOSE 216* 190*  CALCIUM 8.9 8.7   No results found for this basename: LABPT:2,INR:2 in the last 72 hours  General : Awake and alert. No distress Right lower leg: wound benign foot NVI slight tenderness or mid right calf with area of ecchymosis . Negative Homans' calf supple.   Assessment/Plan: 3 Days Post-Op Procedure(s) (LRB): TOTAL KNEE ARTHROPLASTY (Right) Up with therapy Acute blood loss anemia secondary to blood loss no symptoms Calf tenderness and slight ecchymosis most likely due to knee immmobiizer metal stays removed  from posterior aspect of brace. Will d/c  Home today if does well with PT On Xarelto for DVT Prophylaxis  Sonal Dorwart 10/13/2012, 9:11 AM

## 2012-10-13 NOTE — Progress Notes (Signed)
Physical Therapy Treatment Patient Details Name: Pamela Thomas MRN: [id number] DOB: 11/15/1955 Today's Date: 10/13/2012 Time: [id number] PT Time Calculation (min): 28 min  PT Assessment / Plan / Recommendation Comments on Treatment Session  Pt continues to progress slowly but insists on returning home today.  Practiced stairs with pt/husband before D/C, they verbalize/return demonstration.     Follow Up Recommendations  Home health PT     Does the patient have the potential to tolerate intense rehabilitation     Barriers to Discharge        Equipment Recommendations  Rolling walker with 5" wheels    Recommendations for Other Services    Frequency 7X/week   Plan Discharge plan remains appropriate    Precautions / Restrictions Precautions Precautions: Knee;Fall Required Braces or Orthoses: Knee Immobilizer - Right Knee Immobilizer - Right: Discontinue once straight leg raise with < 10 degree lag Restrictions Weight Bearing Restrictions: No Other Position/Activity Restrictions: WBAT   Pertinent Vitals/Pain 8/10 pain.  RN aware.     Mobility  Bed Mobility Bed Mobility: Not assessed Transfers Transfers: Sit to Stand;Stand to Sit Sit to Stand: 5: Supervision;With armrests;From chair/3-in-1 Stand to Sit: With upper extremity assist;With armrests;To chair/3-in-1;4: Min guard Details for Transfer Assistance: Performed x 2 in order to use 3in1 in restroom.  Cues for hand placement, safety and technique.  Ambulation/Gait Ambulation/Gait Assistance: 4: Min assist Ambulation Distance (Feet): 15 Feet (then another 35) Assistive device: Rolling walker Ambulation/Gait Assistance Details: Cues for not pushing RW too far ahead of her, equal step lengths and upright posture.  Gait Pattern: Step-to pattern;Decreased stance time - right;Decreased step length - right;Decreased step length - left Gait velocity: pt limited by pain with WB on R General Gait Details: increased  time Stairs: Yes Stairs Assistance: 4: Min assist;3: Mod assist Stairs Assistance Details (indicate cue type and reason): Cues for sequencing/technique with RW and to take small breaks to catch breath.  Also educated husband on how to assist pt.  Both return/verbalize demonstration.       Exercises     PT Diagnosis:    PT Problem List:   PT Treatment Interventions:     PT Goals Acute Rehab PT Goals PT Goal Formulation: With patient Time For Goal Achievement: 10/16/12 Potential to Achieve Goals: Good Pt will go Sit to Stand: with modified independence PT Goal: Sit to Stand - Progress: Progressing toward goal Pt will go Stand to Sit: with supervision PT Goal: Stand to Sit - Progress: Progressing toward goal Pt will Ambulate: 51 - 150 feet;with supervision;with rolling walker PT Goal: Ambulate - Progress: Progressing toward goal Pt will Go Up / Down Stairs: 1-2 stairs;with min assist;with least restrictive assistive device PT Goal: Up/Down Stairs - Progress: Progressing toward goal  Visit Information  Last PT Received On: 10/13/12 Assistance Needed: +1    Subjective Data  Subjective: I just want to go home.  Patient Stated Goal: Resume previous lifestyle with decreased pain   Cognition  Overall Cognitive Status: Appears within functional limits for tasks assessed/performed Arousal/Alertness: Awake/alert Orientation Level: Appears intact for tasks assessed Behavior During Session: [facility] for tasks performed    Balance     End of Session PT - End of Session Equipment Utilized During Treatment: Right knee immobilizer Activity Tolerance: Patient limited by pain Patient left: in chair;with call bell/phone within reach Nurse Communication: Mobility status   GP     Denice Bors 10/13/2012, 2:53 PM

## 2012-10-17 ENCOUNTER — Other Ambulatory Visit: Payer: Self-pay | Admitting: Family Medicine

## 2012-10-23 ENCOUNTER — Other Ambulatory Visit: Payer: Self-pay | Admitting: Family Medicine

## 2012-10-29 ENCOUNTER — Telehealth: Payer: Self-pay | Admitting: Family Medicine

## 2012-10-29 NOTE — Telephone Encounter (Signed)
Patient is calling because she had a knee replacement about 2 weeks ago and isn't experiencing a lot of pain, but she is having difficulty sleeping and was hoping that Dr. Nori Riis would send something in for her.

## 2012-10-31 MED ORDER — ZOLPIDEM TARTRATE 10 MG PO TABS: 10.0000 mg | ORAL_TABLET | Freq: Every evening | ORAL | Status: DC | PRN

## 2012-10-31 NOTE — Telephone Encounter (Signed)
Patient is calling back because she hasn't heard back from anyone and she is really feeling miserable and she really needs Dr. Kara Mead help.

## 2012-10-31 NOTE — Telephone Encounter (Signed)
Dear Dema Severin Team Please call this Pamela Thomas in and then call her and tell her SORRY---I was out of office yesterday and did nto get mssg. THANKS! [name]

## 2012-10-31 NOTE — Telephone Encounter (Signed)
RX called to pharmacy and patient notified.

## 2012-11-21 ENCOUNTER — Ambulatory Visit (INDEPENDENT_AMBULATORY_CARE_PROVIDER_SITE_OTHER): Payer: 59 | Admitting: Family Medicine

## 2012-11-21 ENCOUNTER — Encounter: Payer: Self-pay | Admitting: Family Medicine

## 2012-11-21 VITALS — BP 150/84 | HR 101 | Temp 99.1°F | Ht 62.0 in | Wt 234.6 lb

## 2012-11-21 DIAGNOSIS — M1711 Unilateral primary osteoarthritis, right knee: Secondary | ICD-10-CM

## 2012-11-21 DIAGNOSIS — M1710 Unilateral primary osteoarthritis, unspecified knee: Secondary | ICD-10-CM

## 2012-11-21 DIAGNOSIS — IMO0002 Reserved for concepts with insufficient information to code with codable children: Secondary | ICD-10-CM

## 2012-11-21 DIAGNOSIS — G4700 Insomnia, unspecified: Secondary | ICD-10-CM

## 2012-11-21 DIAGNOSIS — J322 Chronic ethmoidal sinusitis: Secondary | ICD-10-CM

## 2012-11-21 MED ORDER — SULFAMETHOXAZOLE-TRIMETHOPRIM 800-160 MG PO TABS: 1.0000 | ORAL_TABLET | Freq: Two times a day (BID) | ORAL | Status: DC

## 2012-11-21 MED ORDER — DIAZEPAM 5 MG PO TABS: 5.0000 mg | ORAL_TABLET | Freq: Every evening | ORAL | Status: DC | PRN

## 2012-11-21 MED ORDER — HYDROMORPHONE HCL 2 MG PO TABS: ORAL_TABLET | ORAL | Status: DC

## 2012-11-21 MED ORDER — MELOXICAM 15 MG PO TABS: 15.0000 mg | ORAL_TABLET | Freq: Every day | ORAL | Status: DC

## 2012-11-21 NOTE — Patient Instructions (Addendum)
Get some OTC decongestant and take for 4-5 days I am calling the antibiotic in You should start to feel better in 3-5 days---if getting worse, I need to know  RE the sleep issue--mix and match as we discussed using  1 or 2  tab of the dilaudid-----I would make 2 tabs twice a day the max for pain relief. I would take the dilaudid about an hour or two prior to sleep.  The other thing to try is the diazepam--one at bed time--you CAN take that with the dilauduid but make sure you try each separately first. If none of this is working by early next week, give me a call.

## 2012-11-21 NOTE — Assessment & Plan Note (Signed)
Actually doing quite well she's only about 6 weeks it out now she is walking fairly normally. I do think some of her insomnia is related to untreated pain. She had such horrible pain for so long with a Ross for arthritis that'll think she recognizes her current discomfort as pain it would keep her weight. We'll try her on a different type of narcotic medication and also add benzodiazepine at night when necessary. This is not working she'll call me. I expect both of these will be short-term medications the

## 2012-11-21 NOTE — Progress Notes (Signed)
  Subjective:    Patient ID: Pamela Thomas, female    DOB: Dec 17, 1955, 57 y.o.   MRN: [id number]  HPI  #1. Insomnia and difficulty sleeping. Recently had her right knee replaced and is doing extremely well from that but is not really able to take any of the pain medicine I gave her. Oxycodone made her feel like she was "orbiting outside the world", hydrocodone did not seem to make any difference at all in her pain and neither did the tramadol. She thought the Robaxin did nothing for her. So essentially she's been taking Mobic and Tylenol. She does pretty well during the day but at night she just can't a comfortable. Recently had an episode where she went 48 hours with only 3 hours of sleep. She became desperate and call the office we gave her some Ambien which helped for the first few nights but now it's not helping either. She's never had problem with insomnia before. She thinks it's mostly related to discomfort in her knee. She says it's not really horrible pain like she had before but almost a restless leg type of issue. #2. Bilateral ear pain. She had a cold for about a week and started getting better and then felt like she got acutely worse yesterday and today with headache and facial pain. Her ears are stuffed up.  Review of Systems Denies fever, sweats, chills. See history of present illness.    Objective:   Physical Exam Vital signs are reviewed GENERAL: Well-developed female no acute distress HEENT: TM on the right is retracted but still has some recognizable landmarks or the TM on the left as no cone of light at all. Maxillary sinuses are tender to palpation particularly on the left. CV: RRR without murmur LUNGS: CTA bilaterally with breath sounds heard in all lung fields. No rales or wheezes ABDOMEN: soft, positive bowel sounds. EXTREMITY: MOE ax 4, no edema well-healed right knee scar. NEURO: no gross focal deficit is noted PSYCH: AxOx$. Normally interactive.        Assessment & Plan:  #1. Sinusitis acute. We'll treat with antibiotic. I think this is also what causing her ear pain

## 2012-12-15 ENCOUNTER — Other Ambulatory Visit: Payer: Self-pay | Admitting: Family Medicine

## 2013-01-23 ENCOUNTER — Other Ambulatory Visit: Payer: Self-pay | Admitting: Family Medicine

## 2013-01-23 DIAGNOSIS — Z1231 Encounter for screening mammogram for malignant neoplasm of breast: Secondary | ICD-10-CM

## 2013-03-05 ENCOUNTER — Ambulatory Visit (HOSPITAL_COMMUNITY)
Admission: RE | Admit: 2013-03-05 | Discharge: 2013-03-05 | Disposition: A | Discharge status: Home / Self Care | Payer: 59 | Source: Ambulatory Visit | Attending: Family Medicine | Admitting: Family Medicine

## 2013-03-05 DIAGNOSIS — Z1231 Encounter for screening mammogram for malignant neoplasm of breast: Secondary | ICD-10-CM

## 2013-04-15 ENCOUNTER — Other Ambulatory Visit: Payer: Self-pay | Admitting: Family Medicine

## 2013-05-15 ENCOUNTER — Ambulatory Visit (INDEPENDENT_AMBULATORY_CARE_PROVIDER_SITE_OTHER): Payer: 59 | Admitting: Family Medicine

## 2013-05-15 ENCOUNTER — Encounter: Payer: Self-pay | Admitting: Family Medicine

## 2013-05-15 VITALS — BP 165/80 | HR 79 | Temp 98.9°F | Ht 62.0 in | Wt 247.5 lb

## 2013-05-15 DIAGNOSIS — IMO0002 Reserved for concepts with insufficient information to code with codable children: Secondary | ICD-10-CM

## 2013-05-15 DIAGNOSIS — R269 Unspecified abnormalities of gait and mobility: Secondary | ICD-10-CM

## 2013-05-15 DIAGNOSIS — M1711 Unilateral primary osteoarthritis, right knee: Secondary | ICD-10-CM

## 2013-05-15 DIAGNOSIS — R7309 Other abnormal glucose: Secondary | ICD-10-CM

## 2013-05-15 DIAGNOSIS — R42 Dizziness and giddiness: Secondary | ICD-10-CM

## 2013-05-15 DIAGNOSIS — I10 Essential (primary) hypertension: Secondary | ICD-10-CM

## 2013-05-15 DIAGNOSIS — M1710 Unilateral primary osteoarthritis, unspecified knee: Secondary | ICD-10-CM

## 2013-05-15 DIAGNOSIS — Z23 Encounter for immunization: Secondary | ICD-10-CM

## 2013-05-15 DIAGNOSIS — E785 Hyperlipidemia, unspecified: Secondary | ICD-10-CM

## 2013-05-15 LAB — CBC WITH DIFFERENTIAL/PLATELET
Basophils Relative: 0 % (ref 0–1)
HCT: 39.2 % (ref 36.0–46.0)
Hemoglobin: 13 g/dL (ref 12.0–15.0)
Lymphs Abs: 2.6 10*3/uL (ref 0.7–4.0)
MCHC: 33.2 g/dL (ref 30.0–36.0)
Monocytes Absolute: 0.4 10*3/uL (ref 0.1–1.0)
Monocytes Relative: 6 % (ref 3–12)
Neutro Abs: 4 10*3/uL (ref 1.7–7.7)

## 2013-05-15 LAB — COMPREHENSIVE METABOLIC PANEL
ALT: 24 U/L (ref 0–35)
Alkaline Phosphatase: 67 U/L (ref 39–117)
Glucose, Bld: 216 mg/dL — ABNORMAL HIGH (ref 70–99)
Sodium: 138 mEq/L (ref 135–145)
Total Bilirubin: 0.4 mg/dL (ref 0.3–1.2)
Total Protein: 6.7 g/dL (ref 6.0–8.3)

## 2013-05-15 LAB — TSH: TSH: 2.132 u[IU]/mL (ref 0.350–4.500)

## 2013-05-15 NOTE — Patient Instructions (Addendum)
Please ask your eye doctor to test your visual fields We will call you rea PT appt for gait analysis and retraining Start the fluticasone nasal spray Keep a record of your blood pressures and see me in one month I will send you a note about your blood work GREAT to see you CALL if things get worse or new symptoms [phone] is the phone number at Sports medicine

## 2013-05-16 ENCOUNTER — Other Ambulatory Visit: Payer: Self-pay | Admitting: Family Medicine

## 2013-05-16 DIAGNOSIS — M1711 Unilateral primary osteoarthritis, right knee: Secondary | ICD-10-CM

## 2013-05-16 DIAGNOSIS — R269 Unspecified abnormalities of gait and mobility: Secondary | ICD-10-CM

## 2013-05-16 NOTE — Assessment & Plan Note (Signed)
Blood pressure little elevated today. She has been compliant with all her medicines. We had decreased her beta blocker to twice a day from 3 times a day when she was having some dizziness so I hesitate to go back up on that. Right now it is going to have her get some blood pressure readings and see her in a month. Like to workup the other possible components of her dizziness first before starting to change her blood pressure medicine.

## 2013-05-16 NOTE — Assessment & Plan Note (Signed)
Her knee pain is much improved she has noticed some gait abnormalities. Evidently she had quite a bit of portion that was noted during surgery. She wonders if her gait changes are contributed to her unsteadiness. I suspect they are and I'll refer her for physical therapy gait analysis and potential rehabilitation.

## 2013-05-16 NOTE — Progress Notes (Signed)
  Subjective:    Patient ID: Pamela Thomas, female    DOB: 12-21-1955, 57 y.o.   MRN: [id number]  HPI  #1. Blood pressures little bit elevated. She's been having some dizziness when she's not sure is raise the blood pressure. When she stands up from a chair she is unsteady for 20 or 30 seconds. It essentially resolved but she's noticed a lot of gait change in difficulty with stumbling, running into things. She's run into a doorway, accidentally ran into a wall.  #2. Status post TKR the right knee. Evidently had a lot of torsion when her knee was replaced. She wonders if this is causing some gait ever mildly. #3. Some left ear tinnitus is intermittent. She's also having a lot of postnasal drainage, dry nonproductive occasional cough and left ear pain. Review of Systems Denies fever, sweats, chills. She's gained a little bit of weight. No chest pain, no palpitations, no lower extremity edema. No fatigue. No signs of depression.    Objective:   Physical Exam  Vital signs reviewed. GENERAL: Well-developed, well-nourished, no acute distress. CARDIOVASCULAR: Regular rate and rhythm no murmur gallop or rub LUNGS: Clear to auscultation bilaterally, no rales or wheeze. ABDOMEN: Soft positive bowel sounds NEURO: No gross focal neurological deficits. MSK: Movement of extremity x 4.        Assessment & Plan:  Will get visual fields to rule out problem with vision. A little concerned about the dizziness but am more concerned about her running into walls and doorframes. A lot of this is a visual loss, gait abnormality, or free need to look for small stroke. We discussed whether or not to do imaging in given the fact she's not having any other focal neurological deficits she decided not to do that right now. I would keep it in the differential. She will get her eyes examined for visual field cut. I'll send her to PT for gait analysis. We'll get some blood work today. We'll see her back in one month  for further evaluation at that time a look at her blood pressure very carefully and her 4 drug regimen. She's had difficult to control blood pressure in the past. Don't think her chronic cough right now is related to her ARB. We'll give her some fluticasone nasal spray for tinnitus ear pain cough and postnasal drainage.

## 2013-05-22 ENCOUNTER — Telehealth: Payer: Self-pay | Admitting: Family Medicine

## 2013-05-22 NOTE — Telephone Encounter (Signed)
Pt called to remind Dr. Nori Riis to call her in a nose spray that they discussed at her last visit. JW

## 2013-05-24 MED ORDER — FLUTICASONE PROPIONATE 50 MCG/ACT NA SUSP: 2.0000 | Freq: Every day | NASAL | Status: DC

## 2013-05-24 NOTE — Telephone Encounter (Signed)
Dear Dema Severin Team OOOOOPPPS! Tell her I am sorry--I am old and forgetful--it is now done [facility]! Pamela Thomas

## 2013-05-24 NOTE — Telephone Encounter (Signed)
Spoke with patient and informed her of below 

## 2013-05-28 ENCOUNTER — Encounter: Payer: Self-pay | Admitting: Family Medicine

## 2013-05-28 ENCOUNTER — Other Ambulatory Visit: Payer: Self-pay | Admitting: Family Medicine

## 2013-05-28 MED ORDER — METFORMIN HCL 1000 MG PO TABS: ORAL_TABLET | ORAL | Status: DC

## 2013-05-28 NOTE — Progress Notes (Signed)
Dear Dema Severin Team Please let her know I am sending a letter with her lab results but basically her blood sugar is up and we need to increase her metformin. This might also be contributing to her dizziness I am sending a new rx in  She will start with 1000 mg once a day and after two weeks go to twice a day and see me in about a month THANKS! [name]

## 2013-05-29 ENCOUNTER — Telehealth: Payer: Self-pay | Admitting: *Deleted

## 2013-05-29 NOTE — Telephone Encounter (Signed)
Patient informed     Dear Dema Severin Team  Please let her know I am sending a letter with her lab results but basically her blood sugar is up and we need to increase her metformin. This might also be contributing to her dizziness  I am sending a new rx in  She will start with 1000 mg once a day and after two weeks go to twice a day and see me in about a month  THANKS!  [name]

## 2013-06-12 ENCOUNTER — Ambulatory Visit: Payer: 59 | Admitting: Family Medicine

## 2013-07-03 ENCOUNTER — Ambulatory Visit: Payer: 59 | Admitting: Family Medicine

## 2013-07-21 ENCOUNTER — Other Ambulatory Visit: Payer: Self-pay | Admitting: Family Medicine

## 2013-10-25 ENCOUNTER — Other Ambulatory Visit: Payer: Self-pay | Admitting: Family Medicine

## 2013-11-06 ENCOUNTER — Telehealth: Payer: Self-pay | Admitting: *Deleted

## 2013-11-06 ENCOUNTER — Encounter: Payer: Self-pay | Admitting: Family Medicine

## 2013-11-06 ENCOUNTER — Ambulatory Visit (INDEPENDENT_AMBULATORY_CARE_PROVIDER_SITE_OTHER): Payer: 59 | Admitting: Family Medicine

## 2013-11-06 VITALS — BP 147/63 | HR 88 | Temp 98.1°F | Ht 62.0 in | Wt 249.5 lb

## 2013-11-06 DIAGNOSIS — B351 Tinea unguium: Secondary | ICD-10-CM

## 2013-11-06 DIAGNOSIS — R42 Dizziness and giddiness: Secondary | ICD-10-CM

## 2013-11-06 DIAGNOSIS — I10 Essential (primary) hypertension: Secondary | ICD-10-CM

## 2013-11-06 MED ORDER — TERBINAFINE HCL 250 MG PO TABS: 250.0000 mg | ORAL_TABLET | Freq: Every day | ORAL | Status: DC

## 2013-11-06 NOTE — Patient Instructions (Signed)
Start lamisil daily for 12 weeks See me in 6 weeks

## 2013-11-06 NOTE — Telephone Encounter (Signed)
Prior Authorization received from [facility] for terbinafine hcl 250 mg tab. Formulary and PA form placed in provider box for completion. Derl Barrow, RN

## 2013-11-11 DIAGNOSIS — B351 Tinea unguium: Secondary | ICD-10-CM | POA: Insufficient documentation

## 2013-11-12 NOTE — Telephone Encounter (Signed)
Left detailed message on patients voicemail. Pamela Thomas, Pamela Thomas

## 2013-11-12 NOTE — Telephone Encounter (Signed)
Dear Pamela Thomas Team Please call her and tell her this:  Her [name] is requiring some special stuff be done before they will approve the medicine I prescribed for her toenail fungus . SO--at her next office visit (which I think is in a few weeks?) or if she does not have one scheduled she needs to make one because I have to take a CULTURE from her toenail--IF it grows out fungus her insurance will pay. If it does not, they will not.  PS culture does not usually involve any pain!  Tell her sorry for inconvenience but I am not in control of this---her insurance company is [facility]! [name]

## 2013-11-13 ENCOUNTER — Telehealth: Payer: Self-pay | Admitting: Family Medicine

## 2013-11-13 NOTE — Telephone Encounter (Signed)
Checking on pre-cert for MRI tomorrow

## 2013-11-14 ENCOUNTER — Inpatient Hospital Stay: Admission: RE | Admit: 2013-11-14 | Payer: 59 | Source: Ambulatory Visit

## 2013-11-14 NOTE — Telephone Encounter (Signed)
Approval #: [id number]

## 2013-11-14 NOTE — Progress Notes (Signed)
   Subjective:    Patient ID: Pamela Thomas, female    DOB: 03/29/1956, 58 y.o.   MRN: [id number]  HPI Continues to have some blurred vision and intermittent dizziness. Is not related to specific activity. Not doubling of vision but more a blur and seems to happen with both eyes at the same time. No headache associated with it. She does note that during these episodes she has a little more imbalance and difficulty walking in a straight line. She also has noted him balance when she makes a big positional moves such as standing to sitting or standing to lying. She's getting a little bit worried about this because it seems to be getting more frequent happening most days of the week now. She's also a little concerned because her family has told her she seems more forgetful than ever before. She has always been the one the family remember numbers and names and dates and now even with some focusing, she is unable to recall things.  No change in her sleep. No depressive symptoms. Not getting regular exercise.  #2. Pain in her bilateral great toes secondary to some thickened deformed nail particularly on her right toe. She's noticed a lot of flakiness under the nail think she may have a fungus growing there. It's become quite painful for her. She like to have treatment.   Review of Systems Denies fever, sweats, chills, unusual weight change. No delusions or hallucinations. No agitation, denies depressive symptoms. Sleep is normal at typical for her. She's not had any increased shortness of breath, no lower extremity edema, no heartburn, no bowel changes    Objective:   Physical Exam  Vital signs are reviewed GENERAL: Well-developed female overweight, no acute distress. Excellent HEENT: TMs bilaterally are slightly retracted but otherwise normal. Pupils are equal round reactive to light and accommodation. Extraocular muscles and tach. Conjunctivae nonicteric. Neck is without lymphadenopathy. No  thyromegaly. No carotid bruits. Grossly her vision is normal without any loss of visual field, she has no diplopia. CV: Regular rate and rhythm without murmur gallop or rub LUNGS: Clear to auscultation bilaterally NEURO: Cranial nerves II through XII are grossly intact. Her gait is mostly normal although she is a little unsteady at times as she walks down the hall, sort of listing to the left. This occurs only for a couple of steps then she seems to right herself. She was unaware of this when I spoke to her about it.      Assessment & Plan:  #1. Dizziness #2. Visual changes or 3. Focusing her concentration issues versus memory loss. #4. Onychomycosis. #5. Hypertension suboptimal control PLAN: We talk for about imaging her brain as a starting point for this evaluation will then set that up. If we find nothing there then I suspect we should proceed with some neuropsychiatric testing but further evaluate her focusing issues and memory loss. #4. Regarding her onychomycosis, we'll start her on Lamisil.  #5. Hypertension: Given her dizziness I don't think a weight change of blood pressure medicines right now To return to clinic in one month to

## 2013-11-21 ENCOUNTER — Ambulatory Visit
Admission: RE | Admit: 2013-11-21 | Discharge: 2013-11-21 | Disposition: A | Discharge status: Home / Self Care | Payer: 59 | Source: Ambulatory Visit | Attending: Family Medicine | Admitting: Family Medicine

## 2013-11-21 DIAGNOSIS — R42 Dizziness and giddiness: Secondary | ICD-10-CM

## 2013-11-29 ENCOUNTER — Telehealth: Payer: Self-pay | Admitting: Family Medicine

## 2013-11-29 ENCOUNTER — Encounter: Payer: Self-pay | Admitting: Family Medicine

## 2013-11-29 NOTE — Telephone Encounter (Signed)
Relayed message,patient voiced understanding. Pamela Thomas  

## 2013-11-29 NOTE — Telephone Encounter (Signed)
Dear Dema Severin Team Please let her know the MRI was normal. [name]

## 2013-12-18 ENCOUNTER — Ambulatory Visit (INDEPENDENT_AMBULATORY_CARE_PROVIDER_SITE_OTHER): Payer: 59 | Admitting: Family Medicine

## 2013-12-18 ENCOUNTER — Encounter: Payer: Self-pay | Admitting: Family Medicine

## 2013-12-18 VITALS — BP 160/98 | Ht 62.0 in | Wt 252.8 lb

## 2013-12-18 DIAGNOSIS — R42 Dizziness and giddiness: Secondary | ICD-10-CM

## 2013-12-18 DIAGNOSIS — B351 Tinea unguium: Secondary | ICD-10-CM

## 2013-12-18 DIAGNOSIS — I10 Essential (primary) hypertension: Secondary | ICD-10-CM

## 2013-12-18 DIAGNOSIS — E119 Type 2 diabetes mellitus without complications: Secondary | ICD-10-CM

## 2013-12-18 LAB — CBC WITH DIFFERENTIAL/PLATELET
BASOS ABS: 0 10*3/uL (ref 0.0–0.1)
Basophils Relative: 0 % (ref 0–1)
EOS ABS: 0.3 10*3/uL (ref 0.0–0.7)
EOS PCT: 4 % (ref 0–5)
HEMATOCRIT: 39.5 % (ref 36.0–46.0)
Hemoglobin: 13.6 g/dL (ref 12.0–15.0)
Lymphocytes Relative: 39 % (ref 12–46)
Lymphs Abs: 2.7 10*3/uL (ref 0.7–4.0)
MCH: 30.1 pg (ref 26.0–34.0)
MCHC: 34.4 g/dL (ref 30.0–36.0)
MCV: 87.4 fL (ref 78.0–100.0)
Monocytes Absolute: 0.4 10*3/uL (ref 0.1–1.0)
Monocytes Relative: 6 % (ref 3–12)
Neutro Abs: 3.5 10*3/uL (ref 1.7–7.7)
Neutrophils Relative %: 51 % (ref 43–77)
PLATELETS: 223 10*3/uL (ref 150–400)
RBC: 4.52 MIL/uL (ref 3.87–5.11)
RDW: 15.2 % (ref 11.5–15.5)
WBC: 6.9 10*3/uL (ref 4.0–10.5)

## 2013-12-18 LAB — COMPREHENSIVE METABOLIC PANEL
ALBUMIN: 4.3 g/dL (ref 3.5–5.2)
ALK PHOS: 60 U/L (ref 39–117)
ALT: 30 U/L (ref 0–35)
AST: 26 U/L (ref 0–37)
BILIRUBIN TOTAL: 0.5 mg/dL (ref 0.2–1.2)
BUN: 13 mg/dL (ref 6–23)
CO2: 30 mEq/L (ref 19–32)
Calcium: 9.3 mg/dL (ref 8.4–10.5)
Chloride: 100 mEq/L (ref 96–112)
Creat: 0.82 mg/dL (ref 0.50–1.10)
Glucose, Bld: 187 mg/dL — ABNORMAL HIGH (ref 70–99)
POTASSIUM: 4.1 meq/L (ref 3.5–5.3)
SODIUM: 139 meq/L (ref 135–145)
TOTAL PROTEIN: 6.9 g/dL (ref 6.0–8.3)

## 2013-12-19 ENCOUNTER — Telehealth: Payer: Self-pay | Admitting: Family Medicine

## 2013-12-19 LAB — TSH: TSH: 2.563 u[IU]/mL (ref 0.350–4.500)

## 2013-12-19 NOTE — Assessment & Plan Note (Signed)
She has been using some over-the-counter treatment for her thickened toenail it seems a bit helping. Today we did try to do a fungal culture brows not able to get much sample. She'll continue using over-the-counter suffer full 12 weeks. I'll notify her results.

## 2013-12-19 NOTE — Assessment & Plan Note (Signed)
She continues to have some dizziness with occasional blurred vision. We had to MRI of the head. It is normal. She also is experiencing some memory lapses. We discussed next steps today. I think that'll be neuropsych evaluation. She let go ahead and get the blood pressure issue taken care of first so we at that set up and now see her back for these issues in about a month.

## 2013-12-19 NOTE — Assessment & Plan Note (Signed)
Has increased her metformin as we had discussed. She's tolerating this well. She's tried to follow a better diet. To return in 2 months for A1c.

## 2013-12-19 NOTE — Progress Notes (Signed)
Patient ID: Pamela Thomas, female   DOB: 1955/11/09, 58 y.o.   MRN: [id number] Patient Active Problem List   Diagnosis Date Noted  . HYPERGLYCEMIA 12/09/2009    Priority: High  . DYSLIPIDEMIA 05/21/2007    Priority: High  . HYPERTENSION, BENIGN SYSTEMIC 11/09/2006    Priority: High  . Family history of malignant melanoma 06/15/2011    Priority: Medium  . CARDIAC MURMUR 05/27/2009    Priority: Medium  . Right knee DJD, s/p TKR 06/21/2012    Priority: Low  . Onychomycosis due to dermatophyte 11/11/2013  . Vertigo 05/11/2011  . Dizzy 04/22/2011  . Exercise intolerance 01/20/2011  . UNSPECIFIED LABYRINTHITIS 12/09/2009  . PLANTAR FASCIITIS, BILATERAL 05/14/2008  . ALLERGIC RHINITIS, SEASONAL 01/08/2007  . OBESITY, NOS 11/09/2006    Current Outpatient Prescriptions on File Prior to Visit  Medication Sig Dispense Refill  . acetaminophen (TYLENOL) 500 MG tablet Take 1,000 mg by mouth every 6 (six) hours as needed. PAIN      . diazepam (VALIUM) 5 MG tablet Take 1 tablet (5 mg total) by mouth at bedtime as needed for anxiety.  30 tablet  1  . fluticasone (FLONASE) 50 MCG/ACT nasal spray Place 2 sprays into the nose daily.  16 g  12  . furosemide (LASIX) 20 MG tablet TAKE 1 TABLET BY MOUTH EVERY DAY  90 tablet  3  . HYDROmorphone (DILAUDID) 2 MG tablet Take one or two once or twice a day for  kneepain  120 tablet  0  . KLOR-CON M20 20 MEQ tablet TAKE 1 TABLET EVERY DAY  90 tablet  3  . losartan (COZAAR) 100 MG tablet TAKE 1 TABLET BY MOUTH EVERY DAY  90 tablet  3  . meloxicam (MOBIC) 7.5 MG tablet 1 BY MOUTH ONCE DAILY OR TWO TIMES A DAY  180 tablet  3  . metFORMIN (GLUCOPHAGE) 1000 MG tablet Take one by mouth daily for two weeks then increase to one twice a day  60 tablet  12  . metoprolol succinate (TOPROL-XL) 100 MG 24 hr tablet Take 200 mg by mouth daily before breakfast. Take with or immediately following a meal.      . terbinafine (LAMISIL) 250 MG tablet Take 1 tablet (250 mg  total) by mouth daily.  90 tablet  2  . triamterene-hydrochlorothiazide (DYAZIDE) 37.5-25 MG per capsule Take 1 capsule by mouth daily before breakfast.       No current facility-administered medications on file prior to visit.

## 2013-12-19 NOTE — Telephone Encounter (Signed)
Pt wants records faxed to  Dr Levy-vascular dr [phone] Appt is 4-16 at 1:30 This is a referral from dr neal

## 2013-12-19 NOTE — Assessment & Plan Note (Addendum)
Still hypertensive on 4 drug regimen. At this point I think she needs to be evaluated for secondary causes of hypertension. I will send her to the hypertension Center. She'll follow for other problems in about a month We'll go ahead and get some labs for her today to send to the specialist.

## 2013-12-26 ENCOUNTER — Encounter: Payer: Self-pay | Admitting: Family Medicine

## 2013-12-26 DIAGNOSIS — G4700 Insomnia, unspecified: Secondary | ICD-10-CM | POA: Insufficient documentation

## 2014-01-16 LAB — CULTURE, FUNGUS WITHOUT SMEAR

## 2014-02-08 ENCOUNTER — Other Ambulatory Visit: Payer: Self-pay | Admitting: Family Medicine

## 2014-02-11 ENCOUNTER — Encounter: Payer: Self-pay | Admitting: Family Medicine

## 2014-02-14 ENCOUNTER — Other Ambulatory Visit: Payer: Self-pay | Admitting: Dermatology

## 2014-02-18 ENCOUNTER — Other Ambulatory Visit: Payer: Self-pay | Admitting: Family Medicine

## 2014-02-18 MED ORDER — METOPROLOL SUCCINATE ER 50 MG PO TB24: ORAL_TABLET | ORAL | Status: DC

## 2014-02-18 MED ORDER — LOSARTAN POTASSIUM 100 MG PO TABS: 50.0000 mg | ORAL_TABLET | Freq: Two times a day (BID) | ORAL | Status: DC

## 2014-02-18 MED ORDER — FUROSEMIDE 20 MG PO TABS: 20.0000 mg | ORAL_TABLET | Freq: Two times a day (BID) | ORAL | Status: DC

## 2014-02-18 NOTE — Progress Notes (Signed)
Seen by HTN clinic at [facility] (Dr. Hartford Poli). He changed her meds from lopressor to topr; 50 bid. Plans 24 amb BP monitor.

## 2014-03-05 ENCOUNTER — Encounter: Payer: Self-pay | Admitting: Family Medicine

## 2014-03-05 ENCOUNTER — Ambulatory Visit (INDEPENDENT_AMBULATORY_CARE_PROVIDER_SITE_OTHER): Payer: 59 | Admitting: Family Medicine

## 2014-03-05 VITALS — BP 178/110 | HR 84 | Ht 62.0 in | Wt 255.5 lb

## 2014-03-05 DIAGNOSIS — Z569 Unspecified problems related to employment: Secondary | ICD-10-CM

## 2014-03-05 DIAGNOSIS — Z566 Other physical and mental strain related to work: Secondary | ICD-10-CM

## 2014-03-05 DIAGNOSIS — I10 Essential (primary) hypertension: Secondary | ICD-10-CM

## 2014-03-05 DIAGNOSIS — E119 Type 2 diabetes mellitus without complications: Secondary | ICD-10-CM

## 2014-03-05 DIAGNOSIS — Z23 Encounter for immunization: Secondary | ICD-10-CM

## 2014-03-05 LAB — POCT GLYCOSYLATED HEMOGLOBIN (HGB A1C): Hemoglobin A1C: 7.6

## 2014-03-05 MED ORDER — FLUOXETINE HCL 20 MG PO TABS: 20.0000 mg | ORAL_TABLET | Freq: Every day | ORAL | Status: DC

## 2014-03-05 NOTE — Assessment & Plan Note (Signed)
Greater than 50% of our 45 minute office visit was spent in counseling and education regarding these issues. Ultimately decided to start her on fluoxetine 20 mg daily. We discussed side effects. I'll see her back in one month the

## 2014-03-05 NOTE — Progress Notes (Signed)
   Subjective:    Patient ID: Pamela Thomas, female    DOB: [birth year], 58 y.o.   MRN: [id number]  HPI  #1. Followup hypertension. She is seeing Dr. Oren Binet at the hypertension clinic. They have scheduled an and is very blood pressure monitor for her for next week. It makes medication changes. Since then she's been monitoring her blood pressure at home has been quite high [id number] systolic. This is worried or more. She's also having more headaches. She's not sure if that's related to blood pressure or stress #2. Increased stressors at work. Feels like she is overwhelmed much of the time. Sleeping okay, and may just gallop or work done but feels very fatigued by this. Also has been eating more which is what for stress reduction methods.  Review of Systems Pertinent review of systems: negative for fever or unusual weight change.     Objective:   Physical Exam  Vital signs reviewed. GENERAL: Well-developed, well-nourished, no acute distress. CARDIOVASCULAR: Regular rate and rhythm no murmur gallop or rub LUNGS: Clear to auscultation bilaterally, no rales or wheeze. ABDOMEN: Soft positive bowel sounds NEURO: No gross focal neurological deficits. MSK: Movement of extremity x 4.        Assessment & Plan:

## 2014-03-05 NOTE — Assessment & Plan Note (Signed)
Having followed by hypertension clinic. She still not well-controlled. In the making changes as they're following her plan and to her blood pressure monitor next week. Like to see her back in one month.

## 2014-03-07 ENCOUNTER — Encounter: Payer: Self-pay | Admitting: Family Medicine

## 2014-03-19 ENCOUNTER — Other Ambulatory Visit: Payer: Self-pay | Admitting: Family Medicine

## 2014-03-19 DIAGNOSIS — Z1231 Encounter for screening mammogram for malignant neoplasm of breast: Secondary | ICD-10-CM

## 2014-03-31 ENCOUNTER — Encounter: Payer: Self-pay | Admitting: Family Medicine

## 2014-03-31 ENCOUNTER — Other Ambulatory Visit: Payer: Self-pay | Admitting: Family Medicine

## 2014-03-31 NOTE — Progress Notes (Signed)
Recd note from HTN clinic (Dr. Hartford Poli): Goal BP 140/90 meds updated

## 2014-04-02 ENCOUNTER — Ambulatory Visit (INDEPENDENT_AMBULATORY_CARE_PROVIDER_SITE_OTHER): Payer: 59 | Admitting: Family Medicine

## 2014-04-02 ENCOUNTER — Encounter: Payer: Self-pay | Admitting: Family Medicine

## 2014-04-02 VITALS — BP 152/75 | HR 77 | Ht 62.0 in | Wt 249.9 lb

## 2014-04-02 DIAGNOSIS — Z566 Other physical and mental strain related to work: Secondary | ICD-10-CM

## 2014-04-02 DIAGNOSIS — Z569 Unspecified problems related to employment: Secondary | ICD-10-CM

## 2014-04-02 DIAGNOSIS — I10 Essential (primary) hypertension: Secondary | ICD-10-CM

## 2014-04-02 NOTE — Progress Notes (Signed)
   Subjective:    Patient ID: Pamela Thomas, female    DOB: 06-Jan-1956, 58 y.o.   MRN: [id number]  HPI  #1. Followup starting fluoxetine for stress levels, headaches. Stress is significantly improved. Said all the symptoms of chest tightness, tension, stress eating had improved. She's lost 6 pounds. She continues to have a little bit of a frontal headache but is no longer having the stress headaches that lasted 24-48 hours. She thinks the frontal headache is related to sinus disease at she's had it before. #2. Continues to follow with the hypertension clinic. We reviewed her medicines today.  Review of Systems No fever, sweats, chills. Intentional for at least desired weight also 6-1/2 pounds. No hallucinations, no increase in fatigue. Significant improvement in mood and anxiety.    Objective:   Physical Exam  Vital signs reviewed. GENERAL: Well-developed, well-nourished, no acute distress. CARDIOVASCULAR: Regular rate and rhythm  PSYCHIATRIC: Alert and oriented x4. Affect is interactive. She seems much calmer today than the last visit. Denies hallucination. Speech is fluent and normal in content; MSK: Movement of extremity x 4. EXTREMITY: No edema.        Assessment & Plan:

## 2014-04-02 NOTE — Assessment & Plan Note (Signed)
Significant improvement in generalized stress level. She states she didn't realize how strep she was. We'll continue fluoxetine 20 mg daily. She'll keep an eye on how she feels and if she thinks she needs to increase the dose, she'll call me otherwise we'll see her back in 3 months.

## 2014-04-02 NOTE — Assessment & Plan Note (Signed)
I reviewed his note with her. We resolve the medication differences and I have updated her chart today she is on potassium replacement still.

## 2014-04-03 ENCOUNTER — Ambulatory Visit (HOSPITAL_COMMUNITY)
Admission: RE | Admit: 2014-04-03 | Discharge: 2014-04-03 | Disposition: A | Discharge status: Home / Self Care | Payer: 59 | Source: Ambulatory Visit | Attending: Family Medicine | Admitting: Family Medicine

## 2014-04-03 DIAGNOSIS — Z1231 Encounter for screening mammogram for malignant neoplasm of breast: Secondary | ICD-10-CM | POA: Insufficient documentation

## 2014-04-22 ENCOUNTER — Other Ambulatory Visit: Payer: Self-pay | Admitting: *Deleted

## 2014-04-22 MED ORDER — FLUOXETINE HCL 20 MG PO TABS: 20.0000 mg | ORAL_TABLET | Freq: Every day | ORAL | Status: DC

## 2014-06-09 ENCOUNTER — Ambulatory Visit
Admission: RE | Admit: 2014-06-09 | Discharge: 2014-06-09 | Disposition: A | Discharge status: Home / Self Care | Payer: 59 | Source: Ambulatory Visit | Attending: Family Medicine | Admitting: Family Medicine

## 2014-06-09 ENCOUNTER — Ambulatory Visit (INDEPENDENT_AMBULATORY_CARE_PROVIDER_SITE_OTHER): Payer: 59 | Admitting: Family Medicine

## 2014-06-09 ENCOUNTER — Other Ambulatory Visit: Payer: Self-pay | Admitting: *Deleted

## 2014-06-09 ENCOUNTER — Encounter: Payer: Self-pay | Admitting: Family Medicine

## 2014-06-09 VITALS — BP 162/77 | Ht 62.0 in | Wt 246.0 lb

## 2014-06-09 DIAGNOSIS — M25519 Pain in unspecified shoulder: Secondary | ICD-10-CM

## 2014-06-09 DIAGNOSIS — M25511 Pain in right shoulder: Secondary | ICD-10-CM

## 2014-06-09 DIAGNOSIS — M75100 Unspecified rotator cuff tear or rupture of unspecified shoulder, not specified as traumatic: Secondary | ICD-10-CM | POA: Insufficient documentation

## 2014-06-11 NOTE — Progress Notes (Signed)
Patient ID: Pamela Thomas, female   DOB: 12/21/1955, 58 y.o.   MRN: [id number]  Pamela Thomas - 58 y.o. female MRN [id number]  Date of birth: 09-03-1956    SUBJECTIVE:     Date of injury June 08, 2014. Patient was walking down the gravel drive. carrying quite a few items when she slipped and fell on her right side landing mostly on her hip and right shoulder. She had immediate shoulder pain and has been unable to raise her arm above elbow level since. She can't sleep on that side. She's had no hand numbness. Pain is a 4-5/10. ROS:     She's had some bruising noted around the shoulder but no particular swelling. She's not had fever, sweats, chills, unusual weight change. See history of present illness above for addiional pertinent review of systems.  PERTINENT  PMH / PSH FH / / SH:  Past Medical, Surgical, Social, and Family History Reviewed & Updated in the EMR.  Pertinent findings include:  Obesity, history of labyrinthitis, hypertension, status post TKR right knee. Diabetes mellitus.  OBJECTIVE: BP 162/77  Ht 5\' 2"  (1.575 m)  Wt 246 lb (111.585 kg)  BMI 44.98 kg/m2  LMP 10/10/2010  Physical Exam:  Vital signs are reviewed. GENERAL: Well-developed female mild pain in the right shoulder but otherwise no acute distress. NECK: Full range of motion and normal flexion and extension. Shoulder: Right. She cannot raise her elbow past 30 actively and passively she has significant pain and 5 elevate her in abduction greater than 80. Forward flexion is also painful and passive range of motion above 70. Internal rotation extremely limited she cannot place her hand behind her back on the right. ARM: Right. Biceps tendon is nontender to palpation. Seems intact. The biceps muscle reveals no defect. NEURO: Intact sensation soft touch in the right hand. Skin: Mild bruising is noted in the right pectoral area and there are some ecchymoses on the right deltoid area. IMAGING: X-ray views  including scapular Y. and axillary are reviewed and reveal a located shoulder without any sign of fracture.  ASSESSMENT & PLAN:  See problem based charting & AVS for pt instructions.

## 2014-06-16 ENCOUNTER — Ambulatory Visit
Admission: RE | Admit: 2014-06-16 | Discharge: 2014-06-16 | Disposition: A | Discharge status: Home / Self Care | Payer: 59 | Source: Ambulatory Visit | Attending: Family Medicine | Admitting: Family Medicine

## 2014-06-16 DIAGNOSIS — M25511 Pain in right shoulder: Secondary | ICD-10-CM

## 2014-06-17 ENCOUNTER — Telehealth: Payer: Self-pay | Admitting: Family Medicine

## 2014-06-17 NOTE — Telephone Encounter (Signed)
Left mssg She has full thickness RC tear. Needs orthop eval. I am thinking either Carter Kitten or the shoulder guy at [place] name Dr dean). Left mssg for her to call me back --if not I will call her Wed PM after Am clinic [name]

## 2014-06-18 NOTE — Telephone Encounter (Signed)
Discussed with patient by phone. She are he has an appointment to see an orthopedist in the next week for her knee and she wants to go ahead and just discuss with him (Dr. Veverly Fells I think at [facility] ortho---he does shoulder stuff incl revision so he would be a good one). She is leaving out of town tomorrow and her appt w ortho is day after she gets back. Her pain is being managed pretty well by NSAID. She is Pamela Thomas with this plan. [name]

## 2014-07-08 ENCOUNTER — Other Ambulatory Visit: Payer: Self-pay | Admitting: Family Medicine

## 2014-07-09 ENCOUNTER — Other Ambulatory Visit: Payer: Self-pay | Admitting: *Deleted

## 2014-07-10 ENCOUNTER — Other Ambulatory Visit: Payer: Self-pay | Admitting: *Deleted

## 2014-07-11 MED ORDER — METFORMIN HCL 1000 MG PO TABS: ORAL_TABLET | ORAL | Status: DC

## 2014-07-15 ENCOUNTER — Encounter: Payer: Self-pay | Admitting: Family Medicine

## 2014-07-31 ENCOUNTER — Emergency Department (HOSPITAL_COMMUNITY): Payer: 59

## 2014-07-31 ENCOUNTER — Emergency Department (HOSPITAL_COMMUNITY)
Admission: EM | Admit: 2014-07-31 | Discharge: 2014-07-31 | Disposition: A | Discharge status: Home / Self Care | Payer: 59 | Attending: Emergency Medicine | Admitting: Emergency Medicine

## 2014-07-31 ENCOUNTER — Encounter (HOSPITAL_COMMUNITY): Payer: Self-pay | Admitting: Emergency Medicine

## 2014-07-31 DIAGNOSIS — S40012A Contusion of left shoulder, initial encounter: Secondary | ICD-10-CM | POA: Diagnosis not present

## 2014-07-31 DIAGNOSIS — S3210XA Unspecified fracture of sacrum, initial encounter for closed fracture: Secondary | ICD-10-CM | POA: Diagnosis not present

## 2014-07-31 DIAGNOSIS — Z8709 Personal history of other diseases of the respiratory system: Secondary | ICD-10-CM | POA: Diagnosis not present

## 2014-07-31 DIAGNOSIS — Y9289 Other specified places as the place of occurrence of the external cause: Secondary | ICD-10-CM | POA: Insufficient documentation

## 2014-07-31 DIAGNOSIS — S4992XA Unspecified injury of left shoulder and upper arm, initial encounter: Secondary | ICD-10-CM | POA: Diagnosis present

## 2014-07-31 DIAGNOSIS — Z791 Long term (current) use of non-steroidal anti-inflammatories (NSAID): Secondary | ICD-10-CM | POA: Insufficient documentation

## 2014-07-31 DIAGNOSIS — Z9889 Other specified postprocedural states: Secondary | ICD-10-CM | POA: Insufficient documentation

## 2014-07-31 DIAGNOSIS — W108XXA Fall (on) (from) other stairs and steps, initial encounter: Secondary | ICD-10-CM | POA: Diagnosis not present

## 2014-07-31 DIAGNOSIS — Z8669 Personal history of other diseases of the nervous system and sense organs: Secondary | ICD-10-CM | POA: Insufficient documentation

## 2014-07-31 DIAGNOSIS — Y998 Other external cause status: Secondary | ICD-10-CM | POA: Insufficient documentation

## 2014-07-31 DIAGNOSIS — Y9389 Activity, other specified: Secondary | ICD-10-CM | POA: Diagnosis not present

## 2014-07-31 DIAGNOSIS — S322XXA Fracture of coccyx, initial encounter for closed fracture: Secondary | ICD-10-CM

## 2014-07-31 DIAGNOSIS — I10 Essential (primary) hypertension: Secondary | ICD-10-CM | POA: Insufficient documentation

## 2014-07-31 DIAGNOSIS — M1711 Unilateral primary osteoarthritis, right knee: Secondary | ICD-10-CM | POA: Diagnosis not present

## 2014-07-31 DIAGNOSIS — R011 Cardiac murmur, unspecified: Secondary | ICD-10-CM | POA: Diagnosis not present

## 2014-07-31 DIAGNOSIS — Z7951 Long term (current) use of inhaled steroids: Secondary | ICD-10-CM | POA: Diagnosis not present

## 2014-07-31 DIAGNOSIS — E119 Type 2 diabetes mellitus without complications: Secondary | ICD-10-CM | POA: Diagnosis not present

## 2014-07-31 DIAGNOSIS — W01198A Fall on same level from slipping, tripping and stumbling with subsequent striking against other object, initial encounter: Secondary | ICD-10-CM | POA: Insufficient documentation

## 2014-07-31 DIAGNOSIS — E669 Obesity, unspecified: Secondary | ICD-10-CM | POA: Insufficient documentation

## 2014-07-31 DIAGNOSIS — W19XXXA Unspecified fall, initial encounter: Secondary | ICD-10-CM

## 2014-07-31 DIAGNOSIS — Z79899 Other long term (current) drug therapy: Secondary | ICD-10-CM | POA: Diagnosis not present

## 2014-07-31 DIAGNOSIS — Z87891 Personal history of nicotine dependence: Secondary | ICD-10-CM | POA: Diagnosis not present

## 2014-07-31 NOTE — Discharge Instructions (Signed)
Contusion A contusion is a deep bruise. Contusions are the result of an injury that caused bleeding under the skin. The contusion may turn blue, purple, or yellow. Minor injuries will give you a painless contusion, but more severe contusions may stay painful and swollen for a few weeks.  CAUSES  A contusion is usually caused by a blow, trauma, or direct force to an area of the body. SYMPTOMS   Swelling and redness of the injured area.  Bruising of the injured area.  Tenderness and soreness of the injured area.  Pain. DIAGNOSIS  The diagnosis can be made by taking a history and physical exam. An X-ray, CT scan, or MRI may be needed to determine if there were any associated injuries, such as fractures. TREATMENT  Specific treatment will depend on what area of the body was injured. In general, the best treatment for a contusion is resting, icing, elevating, and applying cold compresses to the injured area. Over-the-counter medicines may also be recommended for pain control. Ask your caregiver what the best treatment is for your contusion. HOME CARE INSTRUCTIONS   Put ice on the injured area.  Put ice in a plastic bag.  Place a towel between your skin and the bag.  Leave the ice on for 15-20 minutes, 3-4 times a day, or as directed by your health care provider.  Only take over-the-counter or prescription medicines for pain, discomfort, or fever as directed by your caregiver. Your caregiver may recommend avoiding anti-inflammatory medicines (aspirin, ibuprofen, and naproxen) for 48 hours because these medicines may increase bruising.  Rest the injured area.  If possible, elevate the injured area to reduce swelling. SEEK IMMEDIATE MEDICAL CARE IF:   You have increased bruising or swelling.  You have pain that is getting worse.  Your swelling or pain is not relieved with medicines. MAKE SURE YOU:   Understand these instructions.  Will watch your condition.  Will get help  right away if you are not doing well or get worse. Document Released: 06/08/2005 Document Revised: 09/03/2013 Document Reviewed: 07/04/2011 [facility] Patient Information 2015 [place], [place]. This information is not intended to replace advice given to you by your health care provider. Make sure you discuss any questions you have with your health care provider. Fall Prevention and Home Safety Falls cause injuries and can affect all age groups. It is possible to use preventive measures to significantly decrease the likelihood of falls. There are many simple measures which can make your home safer and prevent falls. OUTDOORS  Repair cracks and edges of walkways and driveways.  Remove high doorway thresholds.  Trim shrubbery on the main path into your home.  Have good outside lighting.  Clear walkways of tools, rocks, debris, and clutter.  Check that handrails are not broken and are securely fastened. Both sides of steps should have handrails.  Have leaves, snow, and ice cleared regularly.  Use sand or salt on walkways during winter months.  In the garage, clean up grease or oil spills. BATHROOM  Install night lights.  Install grab bars by the toilet and in the tub and shower.  Use non-skid mats or decals in the tub or shower.  Place a plastic non-slip stool in the shower to sit on, if needed.  Keep floors dry and clean up all water on the floor immediately.  Remove soap buildup in the tub or shower on a regular basis.  Secure bath mats with non-slip, double-sided rug tape.  Remove throw rugs and tripping hazards  from the floors. BEDROOMS  Install night lights.  Make sure a bedside light is easy to reach.  Do not use oversized bedding.  Keep a telephone by your bedside.  Have a firm chair with side arms to use for getting dressed.  Remove throw rugs and tripping hazards from the floor. KITCHEN  Keep handles on pots and pans turned toward the center of the stove. Use  back burners when possible.  Clean up spills quickly and allow time for drying.  Avoid walking on wet floors.  Avoid hot utensils and knives.  Position shelves so they are not too high or low.  Place commonly used objects within easy reach.  If necessary, use a sturdy step stool with a grab bar when reaching.  Keep electrical cables out of the way.  Do not use floor polish or wax that makes floors slippery. If you must use wax, use non-skid floor wax.  Remove throw rugs and tripping hazards from the floor. STAIRWAYS  Never leave objects on stairs.  Place handrails on both sides of stairways and use them. Fix any loose handrails. Make sure handrails on both sides of the stairways are as long as the stairs.  Check carpeting to make sure it is firmly attached along stairs. Make repairs to worn or loose carpet promptly.  Avoid placing throw rugs at the top or bottom of stairways, or properly secure the rug with carpet tape to prevent slippage. Get rid of throw rugs, if possible.  Have an electrician put in a light switch at the top and bottom of the stairs. OTHER FALL PREVENTION TIPS  Wear low-heel or rubber-soled shoes that are supportive and fit well. Wear closed toe shoes.  When using a stepladder, make sure it is fully opened and both spreaders are firmly locked. Do not climb a closed stepladder.  Add color or contrast paint or tape to grab bars and handrails in your home. Place contrasting color strips on first and last steps.  Learn and use mobility aids as needed. Install an electrical emergency response system.  Turn on lights to avoid dark areas. Replace light bulbs that burn out immediately. Get light switches that glow.  Arrange furniture to create clear pathways. Keep furniture in the same place.  Firmly attach carpet with non-skid or double-sided tape.  Eliminate uneven floor surfaces.  Select a carpet pattern that does not visually hide the edge of  steps.  Be aware of all pets. OTHER HOME SAFETY TIPS  Set the water temperature for 120 F (48.8 C).  Keep emergency numbers on or near the telephone.  Keep smoke detectors on every level of the home and near sleeping areas. Document Released: 08/19/2002 Document Revised: 02/28/2012 Document Reviewed: 11/18/2011 [facility] Patient Information 2015 [place], [place]. This information is not intended to replace advice given to you by your health care provider. Make sure you discuss any questions you have with your health care provider.

## 2014-07-31 NOTE — ED Notes (Addendum)
Per EMS- had rotator cuff surgery Monday. Fell Tuesday, bruised sacrum but was able to get up. Fell down steps today. Bruise noted to left upper arm. Able to push down with left shoulder but unable to pull up. No tenderness to left side. C/o left arm being "sore" and sacrum being "sore" from Tuesday. Reports hitting head. No LOC. No noted bruising on head or legs. No noted deformities except raised bruise on left upper arm. Passed SCCA. A&Ox4. VS: BP 125/94 HR 98 RR 14 SpO2 93% on RA. CBG 106.   Patient account aligns with EMS. Was prescribed Percocet for pain, anti-nausea medication and a muscle relaxer (unable to report names of both.) Reports loss of balance but no lightheadedness, dizziness, chest pain, any other associated symptoms. Took one Percocet at 1015. No other medications taken today.

## 2014-07-31 NOTE — ED Provider Notes (Signed)
CSN: [id number]     Arrival date & time 07/31/14  1432 History   First MD Initiated Contact with Patient 07/31/14 1546     Chief Complaint  Patient presents with  . Arm Injury    left arm  . Fall     (Consider location/radiation/quality/duration/timing/severity/associated sxs/prior Treatment) Patient is a 58 y.o. female presenting with arm injury and fall. The history is provided by the patient. No language interpreter was used.  Arm Injury Location:  Shoulder Injury: no   Shoulder location:  L shoulder Pain details:    Quality:  Aching   Radiates to:  Does not radiate   Severity:  Moderate   Onset quality:  Sudden   Timing:  Constant   Progression:  Unchanged Chronicity:  New Dislocation: no   Foreign body present:  No foreign bodies Tetanus status:  Up to date Prior injury to area:  No Relieved by:  Narcotics Worsened by:  Nothing tried Ineffective treatments:  None tried Risk factors: no concern for non-accidental trauma   Fall  Pt reports she had right rotator cuff surgery on Monday.   Pt fell yesterday and landed on her bottom.  Pt complains of pain in her tailbone.  Pt fell today and hit her left shoulder.  Pt complains of pain in shoulder.  Pt has pain with lifting.  Pt has decreased range of motion.  Pt did not strike right shoulder. No impact of head no loss of conciousness.    Past Medical History  Diagnosis Date  . Glucose intolerance (impaired glucose tolerance)   . Osteoarthritis     rt knee  . Heart murmur     Left ventricle: The cavity size was normal. Wall thickness was  . DYSLIPIDEMIA   . OBESITY, NOS   . UNSPECIFIED LABYRINTHITIS   . ALLERGIC RHINITIS, SEASONAL   . PLANTAR FASCIITIS, BILATERAL   . CARDIAC MURMUR   . HYPERGLYCEMIA   . Exercise intolerance   . Rash   . Dizzy   . Vertigo   . Hypertension   . Dysrhythmia     palpitations-evaluated by Dr Johnsie Cancel 6/12/eccho 6/13  . Diabetes mellitus without complication   . History of blood  transfusion    Past Surgical History  Procedure Laterality Date  . Carpal tunnel release  05/27/03  . Cesarean section  05/27/03  . Breast biopsy  05/27/03    left  . Knee surgery      left- removal fatty tumor  . Total knee arthroplasty  10/10/2012    Procedure: TOTAL KNEE ARTHROPLASTY;  Surgeon: Gearlean Alf, MD;  Location: [facility];  Service: Orthopedics;  Laterality: Right;   Family History  Problem Relation Age of Onset  . Diabetes    . Lung cancer     History  Substance Use Topics  . Smoking status: Former Smoker    Quit date: 09/12/1990  . Smokeless tobacco: Never Used  . Alcohol Use: Yes     Comment: rare   OB History    No data available     Review of Systems  All other systems reviewed and are negative.     Allergies  Review of patient's allergies indicates no known allergies.  Home Medications   Prior to Admission medications   Medication Sig Start Date End Date Taking? Authorizing Provider  acetaminophen (TYLENOL) 500 MG tablet Take 1,000 mg by mouth every 6 (six) hours as needed for headache (headache). PAIN   Yes Historical Provider, MD  FLUoxetine (PROZAC) 20 MG tablet Take 20 mg by mouth daily.   Yes Historical Provider, MD  fluticasone (FLONASE) 50 MCG/ACT nasal spray Place 2 sprays into the nose daily. 05/24/13  Yes Dickie La, MD  furosemide (LASIX) 20 MG tablet Take 1 tablet (20 mg total) by mouth 2 (two) times daily. 02/18/14  Yes Dickie La, MD  losartan (COZAAR) 100 MG tablet Take 0.5 tablets (50 mg total) by mouth 2 (two) times daily. 02/18/14  Yes Dickie La, MD  meloxicam (MOBIC) 7.5 MG tablet Take 7.5 mg by mouth 2 (two) times daily.   Yes Historical Provider, MD  metFORMIN (GLUCOPHAGE) 1000 MG tablet Take one by mouth daily for two weeks then increase to one twice a day 07/11/14  Yes Dickie La, MD  methocarbamol (ROBAXIN) 500 MG tablet Take 500 mg by mouth every 6 (six) hours as needed for muscle spasms (muscle spasms).   Yes Historical  Provider, MD  metoprolol succinate (TOPROL XL) 50 MG 24 hr tablet Take one tab twice daily by mouth ok generic equivalent 02/18/14  Yes Dickie La, MD  oxyCODONE-acetaminophen (PERCOCET) 5-325 MG per tablet Take 1 tablet by mouth every 4 (four) hours as needed for severe pain (pain).    Yes Historical Provider, MD  potassium chloride SA (K-DUR,KLOR-CON) 20 MEQ tablet Take 20 mEq by mouth daily.   Yes Historical Provider, MD  promethazine (PHENERGAN) 25 MG tablet Take 25 mg by mouth every 6 (six) hours as needed for nausea or vomiting (nausea).   Yes Historical Provider, MD  diazepam (VALIUM) 5 MG tablet Take 1 tablet (5 mg total) by mouth at bedtime as needed for anxiety. Patient not taking: Reported on 07/31/2014 11/21/12   Dickie La, MD  FLUoxetine (PROZAC) 20 MG tablet TAKE 1 TABLET (20 MG TOTAL) BY MOUTH DAILY. 07/11/14   Dickie La, MD  HYDROmorphone (DILAUDID) 2 MG tablet Take one or two once or twice a day for  kneepain Patient not taking: Reported on 07/31/2014 11/21/12   Dickie La, MD  meloxicam (MOBIC) 7.5 MG tablet 1 BY MOUTH ONCE DAILY OR TWO TIMES A DAY    Dickie La, MD  potassium chloride SA (KLOR-CON M20) 20 MEQ tablet 1 BY MOUTH ONCE DAILY 04/02/14   Dickie La, MD  terbinafine (LAMISIL) 250 MG tablet Take 1 tablet (250 mg total) by mouth daily. Patient not taking: Reported on 07/31/2014 11/06/13   Dickie La, MD   BP 168/74 mmHg  Pulse 77  Temp(Src) 98.1 F (36.7 C) (Oral)  Resp 20  SpO2 91%  LMP 10/10/2010 Physical Exam  Constitutional: She is oriented to person, place, and time. She appears well-developed and well-nourished.  HENT:  Head: Normocephalic and atraumatic.  Mouth/Throat: Oropharynx is clear and moist.  Eyes: Pupils are equal, round, and reactive to light.  Neck: Normal range of motion.  Cardiovascular: Normal rate and normal heart sounds.   Pulmonary/Chest: Effort normal.  Abdominal: Soft.  Musculoskeletal: She exhibits tenderness.  Right shoulder  in sling, incision covered. Left shoulder bruise arm. Decreased range of motion,  Tender cocyx area.   Neurological: She is alert and oriented to person, place, and time. She has normal reflexes.  Skin: Skin is warm.  Psychiatric: She has a normal mood and affect.  Nursing note and vitals reviewed.   ED Course  Procedures (including critical care time) Labs Review Labs Reviewed - No data to display  Imaging Review Dg  Sacrum/coccyx  07/31/2014   CLINICAL DATA:  Bruising after fall down steps.  EXAM: SACRUM AND COCCYX - 2+ VIEW  COMPARISON:  None.  FINDINGS: Sacroiliac joints appear normal. Focal angulation is noted at the level of the distal sacrum concerning for fracture of indeterminate age.  IMPRESSION: Focal angulation of the distal sacrum is noted concerning for fracture of indeterminate age. CT scan may be performed for further evaluation.   Electronically Signed   By: Sabino Dick M.D.   On: 07/31/2014 16:02   Dg Shoulder Left  07/31/2014   CLINICAL DATA:  Rotator cuff surgery.  Recent fall.  EXAM: LEFT SHOULDER - 2+ VIEW  COMPARISON:  None.  FINDINGS: Glenohumeral joint is intact. No evidence of scapular fracture or humeral fracture. The acromioclavicular joint is intact.  IMPRESSION: No fracture or dislocation.   Electronically Signed   By: Suzy Bouchard M.D.   On: 07/31/2014 15:59     EKG Interpretation None      MDM   Final diagnoses:  Fall  Contusion of left shoulder, initial encounter  Fracture of coccyx, closed, initial encounter    Pt advised to continue pain medication Pt advised not to ambulate without assistance.  Follow up with Dr. Molly Maduro, PA-C 07/31/14 [place], MD 08/06/14 [phone]

## 2014-07-31 NOTE — ED Notes (Signed)
Bed: WA01 Expected date:  Expected time:  Means of arrival:  Comments: EMS, fall

## 2014-08-05 ENCOUNTER — Other Ambulatory Visit: Payer: Self-pay | Admitting: *Deleted

## 2014-08-05 MED ORDER — METFORMIN HCL 1000 MG PO TABS: ORAL_TABLET | ORAL | Status: DC

## 2014-08-15 ENCOUNTER — Telehealth: Payer: Self-pay | Admitting: *Deleted

## 2014-08-15 NOTE — Telephone Encounter (Signed)
Received fax from [facility] requesting a 90 day supply of metformin HCL 1,000 mg tablet per pt request.   Derl Barrow, RN

## 2014-08-15 NOTE — Telephone Encounter (Signed)
Sent in on 11/24

## 2014-09-01 ENCOUNTER — Other Ambulatory Visit: Payer: Self-pay | Admitting: *Deleted

## 2014-09-01 MED ORDER — METFORMIN HCL 1000 MG PO TABS: ORAL_TABLET | ORAL | Status: DC

## 2014-09-01 NOTE — Telephone Encounter (Signed)
Pt requests a 90 day supply.  Derl Barrow, RN

## 2014-09-22 ENCOUNTER — Other Ambulatory Visit: Payer: Self-pay | Admitting: *Deleted

## 2014-09-22 NOTE — Telephone Encounter (Signed)
Need 90 day supply for insurance billing.  Derl Barrow, RN

## 2014-09-23 MED ORDER — METFORMIN HCL 1000 MG PO TABS: 1000.0000 mg | ORAL_TABLET | Freq: Two times a day (BID) | ORAL | Status: DC

## 2014-10-08 ENCOUNTER — Other Ambulatory Visit: Payer: Self-pay | Admitting: Dermatology

## 2014-10-13 ENCOUNTER — Other Ambulatory Visit: Payer: Self-pay | Admitting: *Deleted

## 2014-10-14 MED ORDER — LOSARTAN POTASSIUM 100 MG PO TABS: 50.0000 mg | ORAL_TABLET | Freq: Two times a day (BID) | ORAL | Status: DC

## 2014-10-14 MED ORDER — FLUOXETINE HCL 20 MG PO TABS: 20.0000 mg | ORAL_TABLET | Freq: Every day | ORAL | Status: DC

## 2014-11-07 ENCOUNTER — Other Ambulatory Visit: Payer: Self-pay | Admitting: *Deleted

## 2014-11-09 MED ORDER — POTASSIUM CHLORIDE CRYS ER 20 MEQ PO TBCR: EXTENDED_RELEASE_TABLET | ORAL | Status: DC

## 2015-01-06 LAB — HM DIABETES EYE EXAM

## 2015-01-26 ENCOUNTER — Other Ambulatory Visit: Payer: Self-pay | Admitting: *Deleted

## 2015-01-27 MED ORDER — FUROSEMIDE 20 MG PO TABS: 20.0000 mg | ORAL_TABLET | Freq: Two times a day (BID) | ORAL | Status: DC

## 2015-01-29 MED ORDER — FUROSEMIDE 20 MG PO TABS: 20.0000 mg | ORAL_TABLET | Freq: Two times a day (BID) | ORAL | Status: DC

## 2015-01-29 NOTE — Telephone Encounter (Signed)
Received another refill request for Furosemide 20 mg tablet, medication was approved but stated No Print.  Resent refill electronically. Derl Barrow, RN

## 2015-01-29 NOTE — Addendum Note (Signed)
Addended by: Derl Barrow on: 01/29/2015 04:03 PM   Modules accepted: Orders

## 2015-03-06 ENCOUNTER — Other Ambulatory Visit: Payer: Self-pay | Admitting: Family Medicine

## 2015-03-06 DIAGNOSIS — Z1231 Encounter for screening mammogram for malignant neoplasm of breast: Secondary | ICD-10-CM

## 2015-04-09 ENCOUNTER — Ambulatory Visit (HOSPITAL_COMMUNITY): Payer: Self-pay

## 2015-04-16 ENCOUNTER — Ambulatory Visit (HOSPITAL_COMMUNITY): Payer: Self-pay

## 2015-04-30 ENCOUNTER — Ambulatory Visit (HOSPITAL_COMMUNITY)
Admission: RE | Admit: 2015-04-30 | Discharge: 2015-04-30 | Disposition: A | Discharge status: Home / Self Care | Payer: 59 | Source: Ambulatory Visit | Attending: Family Medicine | Admitting: Family Medicine

## 2015-04-30 ENCOUNTER — Ambulatory Visit (HOSPITAL_COMMUNITY): Payer: 59

## 2015-04-30 DIAGNOSIS — Z1231 Encounter for screening mammogram for malignant neoplasm of breast: Secondary | ICD-10-CM | POA: Insufficient documentation

## 2015-05-11 ENCOUNTER — Other Ambulatory Visit: Payer: Self-pay | Admitting: *Deleted

## 2015-05-11 MED ORDER — METOPROLOL SUCCINATE ER 50 MG PO TB24: ORAL_TABLET | ORAL | Status: DC

## 2015-06-08 ENCOUNTER — Ambulatory Visit (INDEPENDENT_AMBULATORY_CARE_PROVIDER_SITE_OTHER): Payer: 59 | Admitting: Family Medicine

## 2015-06-08 VITALS — BP 158/85 | HR 83 | Temp 98.0°F | Wt 239.0 lb

## 2015-06-08 DIAGNOSIS — R200 Anesthesia of skin: Secondary | ICD-10-CM

## 2015-06-08 DIAGNOSIS — R202 Paresthesia of skin: Secondary | ICD-10-CM

## 2015-06-08 NOTE — Progress Notes (Signed)
    Subjective   Pamela Thomas is a 59 y.o. female that presents for a same day visit   1. Numbness: She reports symptoms for the past 3 weeks. She reports having left shoulder surgery 10 months ago. She has also had some dental work done more recently. Numbness is located in right face below her eyes, right arm and right leg circumferentially. Initially, numbness was intermittent but has now become more frequent about three days ago. Now, she has persistent numbness since three days ago, but has improved a little. She reports feeling like she did not have good control over her legs which has improved.  ROS Per HPI  Social History  Substance Use Topics  . Smoking status: Former Smoker    Quit date: 09/12/1990  . Smokeless tobacco: Never Used  . Alcohol Use: Yes     Comment: rare    No Known Allergies  Objective   BP 158/85 mmHg  Pulse 83  Temp(Src) 98 F (36.7 C) (Oral)  Wt 239 lb (108.41 kg)  LMP 10/10/2010  General: Well appearing, no distress Neuro: Alert, oriented, cranial nerves intact. Decreased pain in right arm and right leg circumferentially. Normal sensation to light touch. 5/5 strength bilaterally upper/lower extremities. Patellar reflexes 1+ bilaterally. Gait normal.  Assessment and Plan   No orders of the defined types were placed in this encounter.    Numbness - refer to neurology - MRI to rule out stroke - Return precautions

## 2015-06-08 NOTE — Patient Instructions (Addendum)
Thank you for coming to see me today. It was a pleasure. Today we talked about:   Numbness: I will get an MRI. You will get a call regarding your results. I will also refer you to a neurologist.  If you have any questions or concerns, please do not hesitate to call the office at [phone].  Sincerely,  Cordelia Poche, MD

## 2015-06-10 ENCOUNTER — Encounter: Payer: Self-pay | Admitting: Neurology

## 2015-06-10 ENCOUNTER — Ambulatory Visit (INDEPENDENT_AMBULATORY_CARE_PROVIDER_SITE_OTHER): Payer: 59 | Admitting: Neurology

## 2015-06-10 VITALS — BP 128/70 | HR 74 | Wt 241.4 lb

## 2015-06-10 DIAGNOSIS — R569 Unspecified convulsions: Secondary | ICD-10-CM | POA: Diagnosis not present

## 2015-06-10 DIAGNOSIS — R42 Dizziness and giddiness: Secondary | ICD-10-CM

## 2015-06-10 DIAGNOSIS — IMO0001 Reserved for inherently not codable concepts without codable children: Secondary | ICD-10-CM

## 2015-06-10 DIAGNOSIS — E785 Hyperlipidemia, unspecified: Secondary | ICD-10-CM

## 2015-06-10 DIAGNOSIS — R6889 Other general symptoms and signs: Secondary | ICD-10-CM

## 2015-06-10 DIAGNOSIS — R200 Anesthesia of skin: Secondary | ICD-10-CM

## 2015-06-10 DIAGNOSIS — E1142 Type 2 diabetes mellitus with diabetic polyneuropathy: Secondary | ICD-10-CM

## 2015-06-10 NOTE — Progress Notes (Signed)
[facility] Clinic Note - Initial Visit   Date: 06/10/2015  Pamela Thomas MRN: [id number] DOB: [birth year]   Dear Dr. Lonny Prude:  Thank you for your kind referral of Pamela Thomas for consultation of right sided  numbness. Although her history is well known to you, please allow [name] to reiterate it for the purpose of our medical record. The patient was accompanied to the clinic by self.    History of Present Illness: Pamela Thomas is a 59 y.o. right-handed Caucasian female with type 2 diabetes mellitus, hyperlipidemia, hypertension, and axniety presenting for evaluation of right sided numbness.    Around the first week of August, she started having spells of cheek and tongue tingling/numbness on the right, lasting an 1-3 hours. Over the past month, she began have spells of right arm and leg numbness on the lateral surface. These spells occur about 2-3 times per week, last anywhere from 1-3 hours to 4-days, which was her last one.  She does not have gross weakness, but fine motor movement are difficult because she cannot feel things, for instance, she attempted writing and was unable to hold a coffee cup, but was unable to do.  On her tongue, she has constant sensation of burning and feel that it is constantly numb.  No history of headaches, changes in vision, slurred speech, or difficulty swallowing.  She had some dental surgery in October 2015 and was concerned this may be causing her new facial paresthesias.  She takes daily aspirin $RemoveBefor'81mg'[id number]$ .  No history of TIAs or stroke.  She also complains of bilateral feet numbness, worse on the right, which has been ongoing for the past 4 years.    She had had three falls per year since 2014.  Some of the falls were associated with pain medication, which she was taking post-operatively for shoulder surgery.  Her last two falls occurred when turning and loosing her balance.  Another fall was mechanical after tripping over a  stone.  The other falls occurred when going downstairs.    Out-side paper records, electronic medical record, and images have been reviewed where available and summarized as:  MRI brain wo contrast 11/21/2013:  Mild subcortical and periventricular T2 and FLAIR hyperintensities, likely chronic microvascular ischemic change.  No acute intracranial abnormalities.  Lab Results  Component Value Date   TSH 2.563 12/18/2013   Lab Results  Component Value Date   HGBA1C 7.6 03/05/2014    Past Medical History  Diagnosis Date  . Glucose intolerance (impaired glucose tolerance)   . Osteoarthritis     rt knee  . Heart murmur     Left ventricle: The cavity size was normal. Wall thickness was  . DYSLIPIDEMIA   . OBESITY, NOS   . UNSPECIFIED LABYRINTHITIS   . ALLERGIC RHINITIS, SEASONAL   . PLANTAR FASCIITIS, BILATERAL   . CARDIAC MURMUR   . HYPERGLYCEMIA   . Exercise intolerance   . Rash   . Dizzy   . Vertigo   . Hypertension   . Dysrhythmia     palpitations-evaluated by Dr Johnsie Cancel 6/12/eccho 6/13  . Diabetes mellitus without complication   . History of blood transfusion     Past Surgical History  Procedure Laterality Date  . Carpal tunnel release  05/27/03    Right  . Cesarean section  05/27/03  . Breast biopsy  05/27/03    left  . Knee surgery      left- removal fatty tumor  . Total  knee arthroplasty  10/10/2012    Procedure: TOTAL KNEE ARTHROPLASTY;  Surgeon: Gearlean Alf, MD;  Location: [facility];  Service: Orthopedics;  Laterality: Right;     Medications:  Outpatient Encounter Prescriptions as of 06/10/2015  Medication Sig  . acetaminophen (TYLENOL) 500 MG tablet Take 1,000 mg by mouth every 6 (six) hours as needed for headache (headache). PAIN  . aspirin 81 MG tablet Take 81 mg by mouth daily.  Marland Kitchen FLUoxetine (PROZAC) 20 MG tablet Take 1 tablet (20 mg total) by mouth daily.  . fluticasone (FLONASE) 50 MCG/ACT nasal spray Place 2 sprays into the nose daily.  . furosemide  (LASIX) 20 MG tablet Take 1 tablet (20 mg total) by mouth 2 (two) times daily.  Marland Kitchen losartan (COZAAR) 100 MG tablet Take 0.5 tablets (50 mg total) by mouth 2 (two) times daily.  . meloxicam (MOBIC) 7.5 MG tablet 1 BY MOUTH ONCE DAILY OR TWO TIMES A DAY  . metFORMIN (GLUCOPHAGE) 1000 MG tablet Take 1 tablet (1,000 mg total) by mouth 2 (two) times daily with a meal.  . metoprolol succinate (TOPROL XL) 50 MG 24 hr tablet Take one tab twice daily by mouth ok generic equivalent  . potassium chloride SA (KLOR-CON M20) 20 MEQ tablet 1 BY MOUTH ONCE DAILY  . [DISCONTINUED] diazepam (VALIUM) 5 MG tablet Take 1 tablet (5 mg total) by mouth at bedtime as needed for anxiety. (Patient not taking: Reported on 07/31/2014)  . [DISCONTINUED] HYDROmorphone (DILAUDID) 2 MG tablet Take one or two once or twice a day for  kneepain (Patient not taking: Reported on 07/31/2014)  . [DISCONTINUED] methocarbamol (ROBAXIN) 500 MG tablet Take 500 mg by mouth every 6 (six) hours as needed for muscle spasms (muscle spasms).  . [DISCONTINUED] oxyCODONE-acetaminophen (PERCOCET) 5-325 MG per tablet Take 1 tablet by mouth every 4 (four) hours as needed for severe pain (pain).   . [DISCONTINUED] promethazine (PHENERGAN) 25 MG tablet Take 25 mg by mouth every 6 (six) hours as needed for nausea or vomiting (nausea).  . [DISCONTINUED] terbinafine (LAMISIL) 250 MG tablet Take 1 tablet (250 mg total) by mouth daily. (Patient not taking: Reported on 07/31/2014)   No facility-administered encounter medications on file as of 06/10/2015.     Allergies: No Known Allergies  Family History: Family History  Problem Relation Age of Onset  . Diabetes    . Lung cancer Father     Deceaesd  . COPD Mother     Deceased  . Melanoma Brother     Deceased  . COPD Brother   . Healthy Daughter     Social History: Social History  Substance Use Topics  . Smoking status: Former Smoker    Quit date: 09/12/1990  . Smokeless tobacco: Never Used  .  Alcohol Use: 0.0 oz/week    0 Standard drinks or equivalent per week     Comment: rare   Social History   Social History Narrative   Lives with husband in a split level home.  Has 3 children.     Works as a Insurance account manager.    Highest level of education:  12th grade    Review of Systems:  CONSTITUTIONAL: No fevers, chills, night sweats, or weight loss.   EYES: No visual changes or eye pain ENT: No hearing changes.  No history of nose bleeds.   RESPIRATORY: No cough, wheezing and shortness of breath.   CARDIOVASCULAR: Negative for chest pain, and palpitations.   GI: Negative for abdominal discomfort, blood  in stools or black stools.  No recent change in bowel habits.   GU:  No history of incontinence.   MUSCLOSKELETAL: No history of joint pain or swelling.  No myalgias.   SKIN: Negative for lesions, rash, and itching.   HEMATOLOGY/ONCOLOGY: Negative for prolonged bleeding, bruising easily, and swollen nodes.  No history of cancer.   ENDOCRINE: Negative for cold or heat intolerance, polydipsia or goiter.   PSYCH:  +depression or anxiety symptoms.   NEURO: As Above.   Vital Signs:  BP 128/70 mmHg  Pulse 74  Wt 241 lb 7 oz (109.515 kg)  SpO2 97%  LMP 10/10/2010   General Medical Exam:   General:  Well appearing, comfortable.   Eyes/ENT: see cranial nerve examination.   Neck: No masses appreciated.  Full range of motion without tenderness.  No carotid bruits. Respiratory:  Clear to auscultation, good air entry bilaterally.   Cardiac:  Regular rate and rhythm, systolic murmur.   Extremities:  No deformities, edema, or skin discoloration.  Skin:  No rashes or lesions.  Neurological Exam: MENTAL STATUS including orientation to time, place, person, recent and remote memory, attention span and concentration, language, and fund of knowledge is normal.  Speech is not dysarthric.  CRANIAL NERVES: II:  No visual field defects.  Unremarkable fundi.   III-IV-VI: Pupils equal  round and reactive to light.  Normal conjugate, extra-ocular eye movements in all directions of gaze.  No nystagmus.  No ptosis. V:  Normal facial sensation.   VII:  Normal facial symmetry and movements.  No pathologic facial reflexes.  VIII:  Normal hearing and vestibular function.   IX-X:  Normal palatal movement.   XI:  Normal shoulder shrug and head rotation.   XII:  Normal tongue strength and range of motion, no deviation or fasciculation.  MOTOR:  No atrophy, fasciculations or abnormal movements.  No pronator drift.  Tone is normal.    Right Upper Extremity:    Left Upper Extremity:    Deltoid  5/5   Deltoid  5/5   Biceps  5/5   Biceps  5/5   Triceps  5/5   Triceps  5/5   Wrist extensors  5/5   Wrist extensors  5/5   Wrist flexors  5/5   Wrist flexors  5/5   Finger extensors  5/5   Finger extensors  5/5   Finger flexors  5/5   Finger flexors  5/5   Dorsal interossei  5/5   Dorsal interossei  5/5   Abductor pollicis  5/5   Abductor pollicis  5/5   Tone (Ashworth scale)  0  Tone (Ashworth scale)  0   Right Lower Extremity:    Left Lower Extremity:    Hip flexors  5/5   Hip flexors  5/5   Hip extensors  5/5   Hip extensors  5/5   Knee flexors  5/5   Knee flexors  5/5   Knee extensors  5/5   Knee extensors  5/5   Dorsiflexors  5/5   Dorsiflexors  5/5   Plantarflexors  5/5   Plantarflexors  5/5   Toe extensors  5/5   Toe extensors  5/5   Toe flexors  5/5   Toe flexors  5/5   Tone (Ashworth scale)  0  Tone (Ashworth scale)  0   MSRs:   Reflexes are 1+/4 throughout except absent Achilles bilaterally.  Plantars are down going.  SENSORY:  Vibration absent distal to ankles.  Pin prick is reduced below the knee on the right and mid-calf on the left.  Temperature and light touch intact.  Sensation to all modalities is intact in the upper extremities.  Rhomberg sign is present.   COORDINATION/GAIT: Normal finger-to- nose-finger and heel-to-shin.  Intact rapid alternating movements  bilaterally.  Gait is slightly wide-based due to body habitus.  She is able to perform stressed gait.  Very unsteady with tandem gait.   IMPRESSION: Mrs. Reindel is a 59 year-old female presenting for evaluation of episodic spells of right face, arm, and leg paresthesias.  Her exam shows reduced sensation of the lower legs bilaterally, worse on the right, as well as diminished distal reflexes which is consistent with neuropathy, diabetic in this case. However, this is no lateralizing sensory findings.  Her history is concerning for TIA, and MRI brain has already been scheduled for next week.  I will also order US carotids.  She is already taking aspirin 2m daily.    PLAN/RECOMMENDATIONS:  [facility] MRI brain wo contrast has been scheduled for next week Continue aspirin 836mdaily Check lipid panel, vitamin B12, ESR, CRP, copper She may warrant additional testing, depending on imaging results Stroke warning signs discussed  Return to clinic in 1 months.   The duration of this appointment visit was 50 minutes of face-to-face time with the patient.  Greater than 50% of this time was spent in counseling, explanation of diagnosis, planning of further management, and coordination of care.   Thank you for allowing me to participate in patient's care.  If I can answer any additional questions, I would be pleased to do so.    Sincerely,    Donika K. PaPosey ProntoDO

## 2015-06-10 NOTE — Patient Instructions (Addendum)
US carotids MRI brain wo contrast has been scheduled for next week Continue aspirin 64m daily Check lipid panel, vitamin B12, ESR, CRP, copper Return to clinic in on 10/26 at 11:15am

## 2015-06-12 ENCOUNTER — Other Ambulatory Visit: Payer: Self-pay | Admitting: *Deleted

## 2015-06-12 MED ORDER — FUROSEMIDE 20 MG PO TABS: 20.0000 mg | ORAL_TABLET | Freq: Two times a day (BID) | ORAL | Status: DC

## 2015-06-15 ENCOUNTER — Telehealth: Payer: Self-pay | Admitting: Family Medicine

## 2015-06-15 ENCOUNTER — Ambulatory Visit (HOSPITAL_COMMUNITY): Admission: RE | Admit: 2015-06-15 | Payer: 59 | Source: Ambulatory Visit

## 2015-06-15 NOTE — Telephone Encounter (Signed)
Was unable to have MRI at original appt at [facility] due to problem with machine.  Need new appt and order put in for patient to have an"Open MRI" procedure.

## 2015-06-16 NOTE — Telephone Encounter (Signed)
Will forward to [name] to see if we can get patient scheduled at [facility] long with an open MRI. Pamela Thomas,CMA

## 2015-06-16 NOTE — Telephone Encounter (Signed)
Order will be faxed to [facility] to be scheduled.

## 2015-06-18 ENCOUNTER — Ambulatory Visit (HOSPITAL_COMMUNITY)
Admission: RE | Admit: 2015-06-18 | Discharge: 2015-06-18 | Disposition: A | Discharge status: Home / Self Care | Payer: 59 | Source: Ambulatory Visit | Attending: Neurology | Admitting: Neurology

## 2015-06-18 DIAGNOSIS — R42 Dizziness and giddiness: Secondary | ICD-10-CM | POA: Insufficient documentation

## 2015-06-18 DIAGNOSIS — IMO0001 Reserved for inherently not codable concepts without codable children: Secondary | ICD-10-CM

## 2015-06-18 DIAGNOSIS — R200 Anesthesia of skin: Secondary | ICD-10-CM | POA: Insufficient documentation

## 2015-06-18 DIAGNOSIS — E785 Hyperlipidemia, unspecified: Secondary | ICD-10-CM | POA: Insufficient documentation

## 2015-06-18 DIAGNOSIS — E1142 Type 2 diabetes mellitus with diabetic polyneuropathy: Secondary | ICD-10-CM | POA: Insufficient documentation

## 2015-06-18 DIAGNOSIS — R6889 Other general symptoms and signs: Secondary | ICD-10-CM | POA: Diagnosis not present

## 2015-06-18 DIAGNOSIS — I6523 Occlusion and stenosis of bilateral carotid arteries: Secondary | ICD-10-CM | POA: Diagnosis not present

## 2015-06-30 ENCOUNTER — Ambulatory Visit
Admission: RE | Admit: 2015-06-30 | Discharge: 2015-06-30 | Disposition: A | Discharge status: Home / Self Care | Payer: 59 | Source: Ambulatory Visit | Attending: Family Medicine | Admitting: Family Medicine

## 2015-06-30 DIAGNOSIS — R202 Paresthesia of skin: Secondary | ICD-10-CM

## 2015-06-30 DIAGNOSIS — R200 Anesthesia of skin: Secondary | ICD-10-CM

## 2015-07-03 ENCOUNTER — Telehealth: Payer: Self-pay | Admitting: Family Medicine

## 2015-07-03 NOTE — Telephone Encounter (Signed)
Discussed results of MRI with patient. Patient to follow-up with neurologist to continue workup.

## 2015-07-06 ENCOUNTER — Other Ambulatory Visit: Payer: Self-pay | Admitting: *Deleted

## 2015-07-06 MED ORDER — MELOXICAM 7.5 MG PO TABS: ORAL_TABLET | ORAL | Status: DC

## 2015-07-08 ENCOUNTER — Encounter: Payer: Self-pay | Admitting: Neurology

## 2015-07-08 ENCOUNTER — Ambulatory Visit (INDEPENDENT_AMBULATORY_CARE_PROVIDER_SITE_OTHER): Payer: 59 | Admitting: Neurology

## 2015-07-08 VITALS — BP 158/80 | HR 71 | Ht 62.0 in | Wt 243.2 lb

## 2015-07-08 DIAGNOSIS — E1142 Type 2 diabetes mellitus with diabetic polyneuropathy: Secondary | ICD-10-CM

## 2015-07-08 DIAGNOSIS — E0842 Diabetes mellitus due to underlying condition with diabetic polyneuropathy: Secondary | ICD-10-CM

## 2015-07-08 NOTE — Patient Instructions (Signed)
Return to clinic in 6 months.

## 2015-07-08 NOTE — Progress Notes (Signed)
Follow-up Visit   Date: 07/08/2015    KYLIN BORSA MRN: [id number] DOB: 03/30/1956   Interim History: Pamela Thomas is a 59 y.o. right-handed Caucasian female with type 2 diabetes mellitus, hyperlipidemia, hypertension, and anxiety returning to the clinic for follow-up of generalized paresthesias.  The patient was accompanied to the clinic by self.   History of present illness: Around the first week of August, she started having spells of cheek and tongue tingling/numbness on the right, lasting an 1-3 hours. Over the past month, she began have spells of right arm and leg numbness on the lateral surface. These spells occur about 2-3 times per week, last anywhere from 1-3 hours to 4-days, which was her last one. She does not have gross weakness, but fine motor movement are difficult because she cannot feel things, for instance, she attempted writing and was unable to hold a coffee cup, but was unable to do. On her tongue, she has constant sensation of burning and feel that it is constantly numb. No history of headaches, changes in vision, slurred speech, or difficulty swallowing. She had some dental surgery in October 2015 and was concerned this may be causing her new facial paresthesias.  She takes daily aspirin 81mg . No history of TIAs or stroke.  She also complains of bilateral feet numbness, worse on the right, which has been ongoing for the past 4 years.   She had had three falls per year since 2014. Some of the falls were associated with pain medication, which she was taking post-operatively for shoulder surgery. Her last two falls occurred when turning and loosing her balance. Another fall was mechanical after tripping over a stone. The other falls occurred when going downstairs.   UPDATE 07/08/2015:  Since her last visit, she reports having two spells of right tongue and face numbness, which lasted 1-2 minutes.  Last spells was about two weeks ago.  She no  longer has tingling involving her right thigh. There is significant work-related anxiety and stress and she is taking a vacation next week, so wondering if this could be related to stress.  She does endorse chronic numbness of the feet.  No new symptoms.   Medications:  Current Outpatient Prescriptions on File Prior to Visit  Medication Sig Dispense Refill  . acetaminophen (TYLENOL) 500 MG tablet Take 1,000 mg by mouth every 6 (six) hours as needed for headache (headache). PAIN    . aspirin 81 MG tablet Take 81 mg by mouth daily.    Marland Kitchen FLUoxetine (PROZAC) 20 MG tablet Take 1 tablet (20 mg total) by mouth daily. 90 tablet 3  . fluticasone (FLONASE) 50 MCG/ACT nasal spray Place 2 sprays into the nose daily. 16 g 12  . furosemide (LASIX) 20 MG tablet Take 1 tablet (20 mg total) by mouth 2 (two) times daily. 90 tablet 3  . losartan (COZAAR) 100 MG tablet Take 0.5 tablets (50 mg total) by mouth 2 (two) times daily. 90 tablet 3  . meloxicam (MOBIC) 7.5 MG tablet 1 BY MOUTH ONCE DAILY OR TWO TIMES A DAY 180 tablet 3  . metFORMIN (GLUCOPHAGE) 1000 MG tablet Take 1 tablet (1,000 mg total) by mouth 2 (two) times daily with a meal. 180 tablet 3  . metoprolol succinate (TOPROL XL) 50 MG 24 hr tablet Take one tab twice daily by mouth ok generic equivalent 180 tablet 3  . potassium chloride SA (KLOR-CON M20) 20 MEQ tablet 1 BY MOUTH ONCE DAILY 90 tablet 3  No current facility-administered medications on file prior to visit.    Allergies: No Known Allergies  Review of Systems:  CONSTITUTIONAL: No fevers, chills, night sweats, or weight loss.  EYES: No visual changes or eye pain ENT: No hearing changes.  No history of nose bleeds.   RESPIRATORY: No cough, wheezing and shortness of breath.   CARDIOVASCULAR: Negative for chest pain, and palpitations.   GI: Negative for abdominal discomfort, blood in stools or black stools.  No recent change in bowel habits.   GU:  No history of incontinence.     MUSCLOSKELETAL: No history of joint pain or swelling.  No myalgias.   SKIN: Negative for lesions, rash, and itching.   ENDOCRINE: Negative for cold or heat intolerance, polydipsia or goiter.   PSYCH:  + depression or anxiety symptoms.   NEURO: As Above.   Vital Signs:  BP 158/80 mmHg  Pulse 71  Ht 5\' 2"  (1.575 m)  Wt 243 lb 3 oz (110.309 kg)  BMI 44.47 kg/m2  SpO2 92%  LMP 10/10/2010  Neurological Exam: MENTAL STATUS including orientation to time, place, person, recent and remote memory, attention span and concentration, language, and fund of knowledge is normal.  Speech is not dysarthric.  CRANIAL NERVES:  Pupils equal round and reactive to light.  Normal conjugate, extra-ocular eye movements in all directions of gaze.  No ptosis. Normal facial sensation.  Face is symmetric. Palate elevates symmetrically.  Tongue is midline.  MOTOR:  Motor strength is 5/5 in all extremities. No pronator drift.  Tone is normal.    MSRs: Reflexes are 1+/4 throughout except absent Achilles bilaterally  SENSORY:  Vibration absent distal to ankles. Pin prick is reduced below the knee on the right and mid-calf on the left. Temperature and light touch intact  COORDINATION/GAIT:  Gait is slightly wide-based due to body habitus   Data: MRI brain wo contrast 11/21/2013: Mild subcortical and periventricular T2 and FLAIR hyperintensities, likely chronic microvascular ischemic change. No acute intracranial abnormalities.  MRI brain wo contrast 07/01/2015:  Mild atrophy. Mild small vessel disease. No acute intracranial findings.  US carotids 06/18/2015:  mild atherosclerosis but nothing that would be causing her symptoms.  . Lab Results  Component Value Date   TSH 2.563 12/18/2013   Lab Results  Component Value Date   HGBA1C 7.6 03/05/2014     IMPRESSION/PLAN: 1.  Facial paresthesias, improving.  Unclear etiology.   - MRI brain reviewed and showed scattered white matter changes, likely  related to history of hypertension.  US carotids are normal  - If there are new neurological symptoms, CSF testing would be the next step to exclude demyelinating changes  - Continue aspirin 81mg  2.  Diabetic polyneuropathy of the feet  - Symptoms stable, if there is progression of symptoms atypical for neuropathy, proceed with EMG  - Encouraged tight glycemic control  Return to clinic in 55-months or sooner as needed    The duration of this appointment visit was 25 minutes of face-to-face time with the patient.  Greater than 50% of this time was spent in counseling, explanation of diagnosis, planning of further management, and coordination of care.   Thank you for allowing me to participate in patient's care.  If I can answer any additional questions, I would be pleased to do so.    Sincerely,    Payge Eppes K. Posey Pronto, DO

## 2015-08-12 ENCOUNTER — Ambulatory Visit (INDEPENDENT_AMBULATORY_CARE_PROVIDER_SITE_OTHER): Payer: 59 | Admitting: Family Medicine

## 2015-08-12 ENCOUNTER — Encounter: Payer: Self-pay | Admitting: Family Medicine

## 2015-08-12 ENCOUNTER — Other Ambulatory Visit: Payer: Self-pay | Admitting: Family Medicine

## 2015-08-12 VITALS — BP 141/72 | HR 63 | Temp 98.5°F | Ht 62.0 in | Wt 242.7 lb

## 2015-08-12 DIAGNOSIS — I10 Essential (primary) hypertension: Secondary | ICD-10-CM | POA: Diagnosis not present

## 2015-08-12 DIAGNOSIS — W19XXXS Unspecified fall, sequela: Secondary | ICD-10-CM

## 2015-08-12 DIAGNOSIS — Z23 Encounter for immunization: Secondary | ICD-10-CM | POA: Diagnosis not present

## 2015-08-12 DIAGNOSIS — Z0000 Encounter for general adult medical examination without abnormal findings: Secondary | ICD-10-CM

## 2015-08-12 DIAGNOSIS — G609 Hereditary and idiopathic neuropathy, unspecified: Secondary | ICD-10-CM | POA: Diagnosis not present

## 2015-08-12 DIAGNOSIS — W19XXXA Unspecified fall, initial encounter: Secondary | ICD-10-CM | POA: Insufficient documentation

## 2015-08-12 DIAGNOSIS — E1149 Type 2 diabetes mellitus with other diabetic neurological complication: Secondary | ICD-10-CM

## 2015-08-12 DIAGNOSIS — R296 Repeated falls: Secondary | ICD-10-CM | POA: Insufficient documentation

## 2015-08-12 DIAGNOSIS — E1140 Type 2 diabetes mellitus with diabetic neuropathy, unspecified: Secondary | ICD-10-CM

## 2015-08-12 LAB — COMPREHENSIVE METABOLIC PANEL
ALK PHOS: 68 U/L (ref 33–130)
ALT: 13 U/L (ref 6–29)
AST: 14 U/L (ref 10–35)
Albumin: 4 g/dL (ref 3.6–5.1)
BUN: 12 mg/dL (ref 7–25)
CALCIUM: 9 mg/dL (ref 8.6–10.4)
CHLORIDE: 100 mmol/L (ref 98–110)
CO2: 27 mmol/L (ref 20–31)
Creat: 0.74 mg/dL (ref 0.50–0.99)
GLUCOSE: 103 mg/dL — AB (ref 65–99)
POTASSIUM: 4 mmol/L (ref 3.5–5.3)
Sodium: 138 mmol/L (ref 135–146)
Total Bilirubin: 0.5 mg/dL (ref 0.2–1.2)
Total Protein: 6.8 g/dL (ref 6.1–8.1)

## 2015-08-12 LAB — POCT GLYCOSYLATED HEMOGLOBIN (HGB A1C): Hemoglobin A1C: 6.3

## 2015-08-12 LAB — LDL CHOLESTEROL, DIRECT: Direct LDL: 99 mg/dL (ref <130)

## 2015-08-12 MED ORDER — IBUPROFEN 800 MG PO TABS: 800.0000 mg | ORAL_TABLET | Freq: Three times a day (TID) | ORAL | Status: DC | PRN

## 2015-08-12 MED ORDER — RANITIDINE HCL 150 MG PO CAPS: 150.0000 mg | ORAL_CAPSULE | Freq: Two times a day (BID) | ORAL | Status: DC

## 2015-08-12 NOTE — Patient Instructions (Signed)
Let me see you back in 2-3 months for a follow u.p Great to see you!

## 2015-08-12 NOTE — Assessment & Plan Note (Signed)
I think at this point sending her to neuro rehabilitation for an evaluation and some gait training is her best option and I have sent a referral in. I would like to see her back for follow-up in about 2 months.

## 2015-08-12 NOTE — Progress Notes (Signed)
Subjective:    Patient ID: Pamela Thomas, female    DOB: 1956-02-27, 59 y.o.   MRN: [id number]  HPI Several issues #1. Needs some refills. Plans to follow-up with a complete physical month or so. #2. Has had more than 8 falls in the last year. 2 of them resulted in major surgery, right rotator cuff tear and then he she had a left shoulder fracture with a deltoid muscle avulsion and a rotator cuff tear. This involved 2 surgeries. She was seen by neurology and by ENT to evaluate for central causes of her falls as well as neurological. Both workups were essentially negative. She feels like she's a little clumsy but most of her falls are not related to tripping, or more related to her legs just "giving out". She defines this is not losing sensation in her legs but more sense of walking along and then finding herself on the floor. No aura, no chest pain or shortness of breath associated  with these. No true dizziness associated. She has tried to be more careful but is quite concerned that she is going to injure herself again area #3. Rash on her back. She wonders if it is ringworm. Itchy. #4. Intermittently has reflux symptoms. This occurs most often when she eats think she's not supposed to. She has been buying over-the-counter ranitidine which works well for her but she would like to have a prescription as it would be much cheaper for her in the long run. #5. Follow-up on dysthymia. She's quite impressed with improvement since she started the fluoxetine. Still occasionally tearful. Still occasionally frustrated that she is unable to do some things physically, seems a little slower in mentation over the last 2-3 years. #6. Diabetes mellitus. She continues on her oral medications. She's not checking her blood sugars. She is concerned that she's having some increasing numbness in her feet and whether or not that is related to her diabetes. When she saw the neurologist they offered to do nerve conduction  studies but she didn't really think it change her treatment options so she decided not to do that.  Review of Systems  Constitutional: Negative for fever, activity change, appetite change, fatigue and unexpected weight change.  HENT: Negative for ear pain, tinnitus and trouble swallowing.   Eyes: Negative for visual disturbance.  Respiratory: Negative for shortness of breath.   Musculoskeletal: Positive for arthralgias and gait problem. Negative for myalgias and joint swelling.  Skin: Positive for rash.  Neurological: Negative for tremors, syncope, speech difficulty, weakness, light-headedness and numbness.       Objective:   Physical Exam  Vital signs reviewed. GENERAL: Well-developed, well-nourished, no acute distress. CARDIOVASCULAR: Regular rate and rhythm no murmur gallop or rub LUNGS: Clear to auscultation bilaterally, no rales or wheeze. ABDOMEN: Soft positive bowel sounds NEURO: No gross focal neurological deficits. Normal coordination. MSK: Movement of extremity x 4. Will symmetrical strength bilaterally in upper and lower extremities. Normal strength in dorsiflexion, plantarflexion bilaterally. SKIN: Posterior upper back or along the trapezius there is a lot of solar damage to the skin with a lot of freckling. She has some excoriated areas here I see no specific lesion. The skin is also fairly dry bilaterally in this area. PSYCH: Alert and oriented 4. Affect is interactive. Speech is normal and fluency in content. Memory is intact for recent and remote. FOOT EXAM: She has numbness to soft touch and sharp sensation bilaterally on the plantar surface of both feet. On the left foot  is extends across part of the dorsum and about 2 inches of the tibia. On the right foot it is mostly on the plantar surface. Her vascular status is normal was 2+ bilaterally symmetrical dorsalis pedis and posterior tibial pulses. There are no lesions, no excessive callus.      Assessment & Plan:  Skin  rash. This does not look like tinea. She has a lot of solar damage across her shoulders, a lot of freckling. I don't see any specific actinic keratosis or other lesion over the area is fairly excoriated. I want to reevaluate this in about a month or 2. Alternatively she has a dermatologist that sees her annually and she is due in February; So if I don't see her back before then I urged her to make sure they reevaluate this area. She has been using betamethasone ointment on this but don't think we need thing that strong so would have her use over-the-counter hydrocortisone when necessary.

## 2015-08-12 NOTE — Assessment & Plan Note (Signed)
We'll check A1c today. Also check vitamin B12. Discussed need for very good control of her blood sugar level and more regular follow-up.

## 2015-08-12 NOTE — Assessment & Plan Note (Addendum)
Her blood pressure is actually doing pretty well. We'll continue her current  drug regimen. Get some lab work today.

## 2015-08-13 LAB — HEPATITIS C ANTIBODY: HCV AB: NEGATIVE

## 2015-08-13 LAB — VITAMIN B1

## 2015-08-13 LAB — HIV ANTIBODY (ROUTINE TESTING W REFLEX): HIV 1&2 Ab, 4th Generation: NONREACTIVE

## 2015-08-14 LAB — VITAMIN B12: Vitamin B-12: 342 pg/mL (ref 211–911)

## 2015-08-25 ENCOUNTER — Encounter: Payer: Self-pay | Admitting: Family Medicine

## 2015-08-31 ENCOUNTER — Ambulatory Visit: Payer: 59 | Admitting: Rehabilitative and Restorative Service Providers"

## 2015-09-08 ENCOUNTER — Ambulatory Visit: Payer: 59 | Attending: Family Medicine | Admitting: Physical Therapy

## 2015-10-19 ENCOUNTER — Other Ambulatory Visit: Payer: Self-pay | Admitting: *Deleted

## 2015-10-19 MED ORDER — FLUOXETINE HCL 20 MG PO TABS: 20.0000 mg | ORAL_TABLET | Freq: Every day | ORAL | Status: DC

## 2015-10-25 ENCOUNTER — Other Ambulatory Visit: Payer: Self-pay | Admitting: Family Medicine

## 2015-11-02 ENCOUNTER — Encounter: Payer: Self-pay | Admitting: Neurology

## 2015-11-23 ENCOUNTER — Other Ambulatory Visit: Payer: Self-pay | Admitting: *Deleted

## 2015-11-24 MED ORDER — POTASSIUM CHLORIDE CRYS ER 20 MEQ PO TBCR: EXTENDED_RELEASE_TABLET | ORAL | Status: DC

## 2015-12-07 ENCOUNTER — Other Ambulatory Visit: Payer: Self-pay | Admitting: *Deleted

## 2015-12-07 MED ORDER — FUROSEMIDE 20 MG PO TABS: 20.0000 mg | ORAL_TABLET | Freq: Two times a day (BID) | ORAL | Status: DC

## 2015-12-07 MED ORDER — LOSARTAN POTASSIUM 100 MG PO TABS: 50.0000 mg | ORAL_TABLET | Freq: Two times a day (BID) | ORAL | Status: DC

## 2016-01-06 ENCOUNTER — Ambulatory Visit: Payer: 59 | Admitting: Neurology

## 2016-01-07 ENCOUNTER — Ambulatory Visit: Payer: 59 | Admitting: Neurology

## 2016-03-28 ENCOUNTER — Other Ambulatory Visit: Payer: Self-pay | Admitting: *Deleted

## 2016-03-28 MED ORDER — FUROSEMIDE 20 MG PO TABS: 20.0000 mg | ORAL_TABLET | Freq: Two times a day (BID) | ORAL | Status: DC

## 2016-04-26 ENCOUNTER — Other Ambulatory Visit: Payer: Self-pay | Admitting: Family Medicine

## 2016-04-26 DIAGNOSIS — Z1231 Encounter for screening mammogram for malignant neoplasm of breast: Secondary | ICD-10-CM

## 2016-05-27 ENCOUNTER — Other Ambulatory Visit: Payer: Self-pay | Admitting: Family Medicine

## 2016-05-30 ENCOUNTER — Ambulatory Visit
Admission: RE | Admit: 2016-05-30 | Discharge: 2016-05-30 | Disposition: A | Discharge status: Home / Self Care | Payer: 59 | Source: Ambulatory Visit | Attending: Family Medicine | Admitting: Family Medicine

## 2016-05-30 ENCOUNTER — Other Ambulatory Visit: Payer: Self-pay | Admitting: Family Medicine

## 2016-05-30 ENCOUNTER — Ambulatory Visit: Payer: 59

## 2016-05-30 DIAGNOSIS — Z1231 Encounter for screening mammogram for malignant neoplasm of breast: Secondary | ICD-10-CM

## 2016-07-27 ENCOUNTER — Encounter: Payer: Self-pay | Admitting: Family Medicine

## 2016-07-27 ENCOUNTER — Ambulatory Visit (INDEPENDENT_AMBULATORY_CARE_PROVIDER_SITE_OTHER): Payer: 59 | Admitting: Family Medicine

## 2016-07-27 VITALS — BP 158/80 | HR 70 | Temp 98.3°F | Wt 249.0 lb

## 2016-07-27 DIAGNOSIS — Z0000 Encounter for general adult medical examination without abnormal findings: Secondary | ICD-10-CM | POA: Diagnosis not present

## 2016-07-27 DIAGNOSIS — Z23 Encounter for immunization: Secondary | ICD-10-CM | POA: Diagnosis not present

## 2016-07-27 DIAGNOSIS — E1140 Type 2 diabetes mellitus with diabetic neuropathy, unspecified: Secondary | ICD-10-CM | POA: Diagnosis not present

## 2016-07-27 DIAGNOSIS — I10 Essential (primary) hypertension: Secondary | ICD-10-CM | POA: Diagnosis not present

## 2016-07-27 DIAGNOSIS — IMO0001 Reserved for inherently not codable concepts without codable children: Secondary | ICD-10-CM

## 2016-07-27 DIAGNOSIS — E669 Obesity, unspecified: Secondary | ICD-10-CM

## 2016-07-27 DIAGNOSIS — R21 Rash and other nonspecific skin eruption: Secondary | ICD-10-CM

## 2016-07-27 LAB — COMPLETE METABOLIC PANEL WITH GFR
ALT: 11 U/L (ref 6–29)
AST: 13 U/L (ref 10–35)
Albumin: 4 g/dL (ref 3.6–5.1)
Alkaline Phosphatase: 73 U/L (ref 33–130)
BUN: 11 mg/dL (ref 7–25)
CHLORIDE: 99 mmol/L (ref 98–110)
CO2: 29 mmol/L (ref 20–31)
CREATININE: 0.81 mg/dL (ref 0.50–0.99)
Calcium: 9.3 mg/dL (ref 8.6–10.4)
GFR, Est African American: >89 mL/min (ref >=60)
GFR, Est Non African American: 79 mL/min (ref >=60)
GLUCOSE: 153 mg/dL — AB (ref 65–99)
Potassium: 4 mmol/L (ref 3.5–5.3)
Sodium: 139 mmol/L (ref 135–146)
TOTAL PROTEIN: 6.7 g/dL (ref 6.1–8.1)
Total Bilirubin: 0.4 mg/dL (ref 0.2–1.2)

## 2016-07-27 LAB — POCT GLYCOSYLATED HEMOGLOBIN (HGB A1C): HEMOGLOBIN A1C: 7

## 2016-07-27 LAB — LDL CHOLESTEROL, DIRECT: Direct LDL: 76 mg/dL (ref <130)

## 2016-07-27 MED ORDER — IBUPROFEN 800 MG PO TABS: 800.0000 mg | ORAL_TABLET | Freq: Three times a day (TID) | ORAL | 3 refills | Status: DC | PRN

## 2016-07-27 MED ORDER — BETAMETHASONE DIPROPIONATE 0.05 % EX OINT: TOPICAL_OINTMENT | Freq: Two times a day (BID) | CUTANEOUS | 0 refills | Status: DC

## 2016-07-27 NOTE — Patient Instructions (Signed)
I am giving you some info on colonoscopy. I have given you a zostavax rx. I will send you a note about your labs. Great to see you!

## 2016-07-29 DIAGNOSIS — Z0000 Encounter for general adult medical examination without abnormal findings: Secondary | ICD-10-CM | POA: Insufficient documentation

## 2016-07-29 NOTE — Assessment & Plan Note (Signed)
[facility]. Continue current medication regimen. Check some lab work today.

## 2016-07-29 NOTE — Assessment & Plan Note (Signed)
Discussed obesity, weight loss, activity.

## 2016-07-29 NOTE — Assessment & Plan Note (Signed)
We'll check some lab work today. No medication changes will continue current medication regimen.

## 2016-07-29 NOTE — Progress Notes (Addendum)
    CHIEF COMPLAINT / HPI:   Here for CPE.Doing well, no problems, enjoying life. REVIEW OF SYSTEMS:  Complete 14 point review of systems is otherwise negative except for the knee pain described above. Specifically she's got good energy level, she sleeping well, she has good mood without any depressive symptoms. No abdominal or GI complaints  OBJECTIVE:  Vital signs are reviewed.  Vital signs reviewed. GENERAL: Well-developed, well-nourished, no acute distress. CARDIOVASCULAR: Regular rate and rhythm no murmur gallop or rub LUNGS: Clear to auscultation bilaterally, no rales or wheeze. ABDOMEN: Soft positive bowel sounds NEURO: No gross focal neurological deficits. MSK: Movement of extremity x 4 The calves are soft bilaterally in the popliteal space is benign. HEENT: Conjunctiva are nonicteric. Pupils equal round reactive to light. Extraocular muscles are intact. No nystagmus. Neck is without lymphadenopathy. No thyromegaly. No JVD. Full range of motion and supple. TMs bilaterally have good landmarks. SKIN: Supposed areas of skin or examined in detail including the ears extremities and back. No unusual worrisome lesions were noted. She does have a few scattered small seborrheic keratoses and normal nevi as well as a few small skin tags. PSYCHIATRIC: Alert and oriented 4. Affects interactive. Asks and answers questions properly. Neck normal speech fluency in content. No psychomotor retardation or agitation.       ASSESSMENT / PLAN: Please see problem oriented charting for details

## 2016-07-29 NOTE — Assessment & Plan Note (Signed)
Recommended Pap smear and colonoscopy. She wants to delay the Pap smear at least for a few months if not next year. I gave her information and discussed colonoscopy. She has never had colonoscopy. She had a mammogram almost 2 years ago is due again next month. She is considering whether not she wants to do that. I gave her a prescription for Zostavax. We discussed activity, weight, vitamin D and calcium.

## 2016-08-07 ENCOUNTER — Encounter: Payer: Self-pay | Admitting: Family Medicine

## 2016-09-20 ENCOUNTER — Other Ambulatory Visit: Payer: Self-pay | Admitting: Family Medicine

## 2016-10-12 ENCOUNTER — Other Ambulatory Visit: Payer: Self-pay | Admitting: Family Medicine

## 2016-10-17 ENCOUNTER — Other Ambulatory Visit: Payer: Self-pay | Admitting: Family Medicine

## 2016-11-17 ENCOUNTER — Other Ambulatory Visit: Payer: Self-pay | Admitting: Family Medicine

## 2016-11-20 ENCOUNTER — Other Ambulatory Visit: Payer: Self-pay | Admitting: Family Medicine

## 2017-01-17 ENCOUNTER — Other Ambulatory Visit: Payer: Self-pay | Admitting: Family Medicine

## 2017-02-22 DIAGNOSIS — I10 Essential (primary) hypertension: Secondary | ICD-10-CM | POA: Insufficient documentation

## 2017-07-02 ENCOUNTER — Other Ambulatory Visit: Payer: Self-pay | Admitting: Family Medicine

## 2017-07-03 ENCOUNTER — Other Ambulatory Visit: Payer: Self-pay | Admitting: *Deleted

## 2017-07-03 DIAGNOSIS — I6523 Occlusion and stenosis of bilateral carotid arteries: Secondary | ICD-10-CM

## 2017-07-10 ENCOUNTER — Other Ambulatory Visit: Payer: Self-pay | Admitting: Family Medicine

## 2017-07-10 DIAGNOSIS — Z139 Encounter for screening, unspecified: Secondary | ICD-10-CM

## 2017-08-07 ENCOUNTER — Ambulatory Visit
Admission: RE | Admit: 2017-08-07 | Discharge: 2017-08-07 | Disposition: A | Discharge status: Home / Self Care | Payer: 59 | Source: Ambulatory Visit | Attending: Family Medicine | Admitting: Family Medicine

## 2017-08-07 DIAGNOSIS — Z139 Encounter for screening, unspecified: Secondary | ICD-10-CM

## 2017-09-12 DIAGNOSIS — I2510 Atherosclerotic heart disease of native coronary artery without angina pectoris: Secondary | ICD-10-CM

## 2017-09-12 DIAGNOSIS — I219 Acute myocardial infarction, unspecified: Secondary | ICD-10-CM

## 2017-09-12 HISTORY — DX: Acute myocardial infarction, unspecified: I21.9

## 2017-09-12 HISTORY — DX: Atherosclerotic heart disease of native coronary artery without angina pectoris: I25.10

## 2017-09-22 ENCOUNTER — Other Ambulatory Visit: Payer: Self-pay | Admitting: Family Medicine

## 2017-09-22 NOTE — Telephone Encounter (Signed)
One month supply of losartan has been sent to [facility]. Patient hasn't been seen in office since Nov 2017. Please assist her in scheduling appt. Thank you!

## 2017-09-29 ENCOUNTER — Other Ambulatory Visit: Payer: Self-pay | Admitting: *Deleted

## 2017-10-02 MED ORDER — FLUOXETINE HCL 20 MG PO TABS: 20.0000 mg | ORAL_TABLET | Freq: Every day | ORAL | 3 refills | Status: DC

## 2017-10-12 ENCOUNTER — Encounter: Payer: Self-pay | Admitting: Family Medicine

## 2017-10-19 ENCOUNTER — Other Ambulatory Visit: Payer: Self-pay

## 2017-10-23 ENCOUNTER — Other Ambulatory Visit: Payer: Self-pay

## 2017-10-23 MED ORDER — LOSARTAN POTASSIUM 100 MG PO TABS: 50.0000 mg | ORAL_TABLET | Freq: Two times a day (BID) | ORAL | 3 refills | Status: DC

## 2017-10-23 MED ORDER — METFORMIN HCL 1000 MG PO TABS: 1000.0000 mg | ORAL_TABLET | Freq: Two times a day (BID) | ORAL | 3 refills | Status: DC

## 2017-11-08 ENCOUNTER — Other Ambulatory Visit: Payer: Self-pay

## 2017-11-10 ENCOUNTER — Other Ambulatory Visit: Payer: Self-pay

## 2017-11-10 MED ORDER — POTASSIUM CHLORIDE CRYS ER 20 MEQ PO TBCR: 20.0000 meq | EXTENDED_RELEASE_TABLET | Freq: Every day | ORAL | 3 refills | Status: DC

## 2017-11-11 ENCOUNTER — Other Ambulatory Visit: Payer: Self-pay | Admitting: Family Medicine

## 2017-12-01 ENCOUNTER — Encounter: Payer: Self-pay | Admitting: Family Medicine

## 2018-01-12 ENCOUNTER — Encounter: Payer: Self-pay | Admitting: Student

## 2018-01-12 ENCOUNTER — Ambulatory Visit (INDEPENDENT_AMBULATORY_CARE_PROVIDER_SITE_OTHER): Payer: 59 | Admitting: Student

## 2018-01-12 ENCOUNTER — Other Ambulatory Visit: Payer: Self-pay

## 2018-01-12 VITALS — BP 150/82 | HR 73 | Temp 98.4°F | Ht 62.0 in | Wt 252.0 lb

## 2018-01-12 DIAGNOSIS — E1140 Type 2 diabetes mellitus with diabetic neuropathy, unspecified: Secondary | ICD-10-CM

## 2018-01-12 DIAGNOSIS — R300 Dysuria: Secondary | ICD-10-CM | POA: Diagnosis not present

## 2018-01-12 DIAGNOSIS — M545 Low back pain, unspecified: Secondary | ICD-10-CM | POA: Insufficient documentation

## 2018-01-12 LAB — POCT URINALYSIS DIP (MANUAL ENTRY)
Bilirubin, UA: NEGATIVE
Glucose, UA: NEGATIVE mg/dL
Ketones, POC UA: NEGATIVE mg/dL
Leukocytes, UA: NEGATIVE
Nitrite, UA: NEGATIVE
Protein Ur, POC: NEGATIVE mg/dL
Spec Grav, UA: 1.025 (ref 1.010–1.025)
Urobilinogen, UA: 0.2 U/dL
pH, UA: 5.5 (ref 5.0–8.0)

## 2018-01-12 LAB — POCT UA - MICROSCOPIC ONLY

## 2018-01-12 LAB — POCT GLYCOSYLATED HEMOGLOBIN (HGB A1C): Hemoglobin A1C: 6.9

## 2018-01-12 MED ORDER — CYCLOBENZAPRINE HCL 10 MG PO TABS: 10.0000 mg | ORAL_TABLET | Freq: Every day | ORAL | 0 refills | Status: DC

## 2018-01-12 NOTE — Progress Notes (Signed)
Subjective:    Pamela Thomas is a 62 y.o. old female here for left lower back pain  HPI Left back pain: this has been going on for about two to three months. Worse over the last two days. Spontaneous onset. Pain is dull to . No radiation. Pain is constant. Worse with lying on her left side. Denies numbness or tingling in her legs. Denies burning with urination. Infrequent urination despite drinking plenty of fluid. Denies blood in urine. Denies urgency.  Blood tinged mucous discharge off and on for two months. Reports pruritus over medial aspect of her thigh proximally.  Occasional loose stool with diary products. Denies straining or blood in stool.   PMH/Problem List: has DYSLIPIDEMIA; Obesity; UNSPECIFIED LABYRINTHITIS; HYPERTENSION, BENIGN SYSTEMIC; ALLERGIC RHINITIS, SEASONAL; PLANTAR FASCIITIS, BILATERAL; CARDIAC MURMUR; Type 2 diabetes mellitus with sensory neuropathy ([place]); Exercise intolerance; Dizzy; Vertigo; Family history of malignant melanoma; Right knee DJD, s/p TKR; Onychomycosis due to dermatophyte; Stress at work; Rotator cuff tear; Falls; Hereditary and idiopathic peripheral neuropathy; and Visit for preventive health examination on their problem list.   has a past medical history of ALLERGIC RHINITIS, SEASONAL, CARDIAC MURMUR, Diabetes mellitus without complication ([place]), Dizzy, DYSLIPIDEMIA, Dysrhythmia, Exercise intolerance, Glucose intolerance (impaired glucose tolerance), Heart murmur, History of blood transfusion, HYPERGLYCEMIA, Hypertension, OBESITY, NOS, Osteoarthritis, PLANTAR FASCIITIS, BILATERAL, Rash, UNSPECIFIED LABYRINTHITIS, and Vertigo.  FH:  Family History  Problem Relation Age of Onset  . COPD Mother        Deceased  . Lung cancer Father        Deceaesd  . Diabetes Unknown   . Melanoma Brother        Deceased  . COPD Brother   . Healthy Daughter     SH Social History   Tobacco Use  . Smoking status: Former Smoker    Last attempt to quit: 09/12/1990   Years since quitting: 27.3  . Smokeless tobacco: Never Used  Substance Use Topics  . Alcohol use: Yes    Alcohol/week: 0.0 oz    Comment: rare  . Drug use: No    Review of Systems Review of systems negative except for pertinent positives and negatives in history of present illness above.     Objective:     Vitals:   01/12/18 1532  BP: (!) 150/82  Pulse: 73  Temp: 98.4 F (36.9 C)  TempSrc: Oral  SpO2: 97%  Weight: 252 lb (114.3 kg)  Height: 5\' 2"  (1.575 m)   Body mass index is 46.09 kg/m.  Physical Exam  GEN: appears well & comfortable. No apparent distress CVS: RRR, nl s1 & s2, no murmurs, no edema RESP: no IWOB, good air movement bilaterally, CTAB GI: BS present & normal, soft, mild tenderness over LLQ without rebound or guarding.  GU: no suprapubic or CVA tenderness  MSK: no tenderness over spinous processes.  She has some tenderness over lumbar paraspinal muscles.  Neurovascularly intact in both extremities. SKIN: no apparent skin lesion NEURO: alert and oiented appropriately, no gross deficits   PSYCH: euthymic mood with congruent affect    Assessment and Plan:  1. Acute left-sided low back pain without sciatica: Likely muscle spasm/strain.  She has tenderness over  lumbar paraspinal muscles on the left side.  She has no tenderness over the spinous process.  Neuro exam within normal limits.  She was concerned about a kidney stone.  However, her symptoms, exam and urinalysis not consistent with nephrolithiasis or urinary tract infection she is relieved to hear this.  I  suggested trying Flexeril, back exercise and heating pad.  I gave her home exercise handout.  She says she has upcoming appointment with PCP in 2 weeks. - cyclobenzaprine (FLEXERIL) 10 MG tablet; Take 1 tablet (10 mg total) by mouth at bedtime.  Dispense: 20 tablet; Refill: 0  2. Type 2 diabetes mellitus with sensory neuropathy ([place]): A1c 6.8%.  Follow-up with PCP on this.   Mercy Riding,  MD 01/12/18 Pager: [phone]

## 2018-01-12 NOTE — Patient Instructions (Addendum)
It was great seeing you today! We have addressed the following issues today  Back pain: This is likely due to muscle spasm/strain.  I recommend trying muscle relaxer.  I sent a prescription for this to your pharmacy.  This medication may make you sleepy or drowsy. I recommend taking at night before bedtime.  You may also try heating pad and stretching as we discussed.  If we did any lab work today, and the results require attention, either me or my nurse will get in touch with you. If everything is normal, you will get a letter in mail and a message via . If you don't hear from [name] in two weeks, please give [name] a call. Otherwise, we look forward to seeing you again at your next visit. If you have any questions or concerns before then, please call the clinic at [phone].  Please bring all your medications to every doctors visit  Sign up for My Chart to have easy access to your labs results, and communication with your Primary care physician.    Please check-out at the front desk before leaving the clinic.    Take Care,   Dr. Cyndia Skeeters   Back Exercises If you have pain in your back, do these exercises 2-3 times each day or as told by your doctor. When the pain goes away, do the exercises once each day, but repeat the steps more times for each exercise (do more repetitions). If you do not have pain in your back, do these exercises once each day or as told by your doctor. Exercises Single Knee to Chest  Do these steps 3-5 times in a row for each leg: 1. Lie on your back on a firm bed or the floor with your legs stretched out. 2. Bring one knee to your chest. 3. Hold your knee to your chest by grabbing your knee or thigh. 4. Pull on your knee until you feel a gentle stretch in your lower back. 5. Keep doing the stretch for 10-30 seconds. 6. Slowly let go of your leg and straighten it.  Pelvic Tilt  Do these steps 5-10 times in a row: 1. Lie on your back on a firm bed or the floor with  your legs stretched out. 2. Bend your knees so they point up to the ceiling. Your feet should be flat on the floor. 3. Tighten your lower belly (abdomen) muscles to press your lower back against the floor. This will make your tailbone point up to the ceiling instead of pointing down to your feet or the floor. 4. Stay in this position for 5-10 seconds while you gently tighten your muscles and breathe evenly.  Cat-Cow  Do these steps until your lower back bends more easily: 1. Get on your hands and knees on a firm surface. Keep your hands under your shoulders, and keep your knees under your hips. You may put padding under your knees. 2. Let your head hang down, and make your tailbone point down to the floor so your lower back is round like the back of a cat. 3. Stay in this position for 5 seconds. 4. Slowly lift your head and make your tailbone point up to the ceiling so your back hangs low (sags) like the back of a cow. 5. Stay in this position for 5 seconds.  Press-Ups  Do these steps 5-10 times in a row: 1. Lie on your belly (face-down) on the floor. 2. Place your hands near your head, about shoulder-width  apart. 3. While you keep your back relaxed and keep your hips on the floor, slowly straighten your arms to raise the top half of your body and lift your shoulders. Do not use your back muscles. To make yourself more comfortable, you may change where you place your hands. 4. Stay in this position for 5 seconds. 5. Slowly return to lying flat on the floor.  Bridges  Do these steps 10 times in a row: 1. Lie on your back on a firm surface. 2. Bend your knees so they point up to the ceiling. Your feet should be flat on the floor. 3. Tighten your butt muscles and lift your butt off of the floor until your waist is almost as high as your knees. If you do not feel the muscles working in your butt and the back of your thighs, slide your feet 1-2 inches farther away from your butt. 4. Stay in  this position for 3-5 seconds. 5. Slowly lower your butt to the floor, and let your butt muscles relax.  If this exercise is too easy, try doing it with your arms crossed over your chest. Belly Crunches  Do these steps 5-10 times in a row: 1. Lie on your back on a firm bed or the floor with your legs stretched out. 2. Bend your knees so they point up to the ceiling. Your feet should be flat on the floor. 3. Cross your arms over your chest. 4. Tip your chin a little bit toward your chest but do not bend your neck. 5. Tighten your belly muscles and slowly raise your chest just enough to lift your shoulder blades a tiny bit off of the floor. 6. Slowly lower your chest and your head to the floor.  Back Lifts Do these steps 5-10 times in a row: 1. Lie on your belly (face-down) with your arms at your sides, and rest your forehead on the floor. 2. Tighten the muscles in your legs and your butt. 3. Slowly lift your chest off of the floor while you keep your hips on the floor. Keep the back of your head in line with the curve in your back. Look at the floor while you do this. 4. Stay in this position for 3-5 seconds. 5. Slowly lower your chest and your face to the floor.  Contact a doctor if:  Your back pain gets a lot worse when you do an exercise.  Your back pain does not lessen 2 hours after you exercise. If you have any of these problems, stop doing the exercises. Do not do them again unless your doctor says it is okay. Get help right away if:  You have sudden, very bad back pain. If this happens, stop doing the exercises. Do not do them again unless your doctor says it is okay. This information is not intended to replace advice given to you by your health care provider. Make sure you discuss any questions you have with your health care provider. Document Released: 10/01/2010 Document Revised: 02/04/2016 Document Reviewed: 10/23/2014 Elsevier Interactive Patient Education  [employer].

## 2018-01-24 ENCOUNTER — Encounter: Payer: Self-pay | Admitting: Family Medicine

## 2018-01-24 ENCOUNTER — Ambulatory Visit (INDEPENDENT_AMBULATORY_CARE_PROVIDER_SITE_OTHER): Payer: 59 | Admitting: Family Medicine

## 2018-01-24 ENCOUNTER — Other Ambulatory Visit: Payer: Self-pay

## 2018-01-24 VITALS — BP 170/84 | HR 82 | Temp 98.2°F | Ht 62.0 in | Wt 248.6 lb

## 2018-01-24 DIAGNOSIS — I10 Essential (primary) hypertension: Secondary | ICD-10-CM | POA: Diagnosis not present

## 2018-01-24 DIAGNOSIS — R200 Anesthesia of skin: Secondary | ICD-10-CM | POA: Diagnosis not present

## 2018-01-24 DIAGNOSIS — M25832 Other specified joint disorders, left wrist: Secondary | ICD-10-CM

## 2018-01-24 DIAGNOSIS — R202 Paresthesia of skin: Secondary | ICD-10-CM | POA: Diagnosis not present

## 2018-01-24 MED ORDER — FUROSEMIDE 20 MG PO TABS: 20.0000 mg | ORAL_TABLET | Freq: Two times a day (BID) | ORAL | 3 refills | Status: DC

## 2018-01-24 MED ORDER — CANDESARTAN CILEXETIL 32 MG PO TABS: 32.0000 mg | ORAL_TABLET | Freq: Every day | ORAL | 3 refills | Status: DC

## 2018-01-24 NOTE — Patient Instructions (Signed)
I am sending a neurology referral Labs today See me in 6 weeks or so

## 2018-01-25 LAB — CMP14+EGFR
ALT: 13 IU/L (ref 0–32)
AST: 16 IU/L (ref 0–40)
Albumin/Globulin Ratio: 1.6 (ref 1.2–2.2)
Albumin: 4.1 g/dL (ref 3.6–4.8)
Alkaline Phosphatase: 75 IU/L (ref 39–117)
BUN/Creatinine Ratio: 14 (ref 12–28)
BUN: 11 mg/dL (ref 8–27)
Bilirubin Total: 0.3 mg/dL (ref 0.0–1.2)
CALCIUM: 9 mg/dL (ref 8.7–10.3)
CHLORIDE: 102 mmol/L (ref 96–106)
CO2: 26 mmol/L (ref 20–29)
Creatinine, Ser: 0.78 mg/dL (ref 0.57–1.00)
GFR calc non Af Amer: 82 mL/min/{1.73_m2} (ref >59)
GFR, EST AFRICAN AMERICAN: 94 mL/min/{1.73_m2} (ref >59)
Globulin, Total: 2.5 g/dL (ref 1.5–4.5)
Glucose: 136 mg/dL — ABNORMAL HIGH (ref 65–99)
Potassium: 4.7 mmol/L (ref 3.5–5.2)
Sodium: 142 mmol/L (ref 134–144)
TOTAL PROTEIN: 6.6 g/dL (ref 6.0–8.5)

## 2018-01-25 LAB — LDL CHOLESTEROL, DIRECT: LDL DIRECT: 123 mg/dL — AB (ref 0–99)

## 2018-01-26 DIAGNOSIS — M25832 Other specified joint disorders, left wrist: Secondary | ICD-10-CM | POA: Insufficient documentation

## 2018-01-26 DIAGNOSIS — R200 Anesthesia of skin: Secondary | ICD-10-CM | POA: Insufficient documentation

## 2018-01-26 DIAGNOSIS — R202 Paresthesia of skin: Secondary | ICD-10-CM | POA: Insufficient documentation

## 2018-01-26 NOTE — Assessment & Plan Note (Signed)
Switch her meds around a bit and f/u 1 m. At some point may try consilidating her beta blocker into single daily dose

## 2018-01-26 NOTE — Assessment & Plan Note (Addendum)
I do not think this is related to her hand nubness as all 5 fngers have numbness just from DIp distally, which does not fit with median nerve ditribution and the mass does not seem to be located where it could impact the ulnar nerve. We discussed options--will send to neurology for further eval of her numnbess first. After that we will consider [name] or other imaging of wrist. Does not sound like it has goitten larger and does not really bother her.een there years/

## 2018-01-26 NOTE — Progress Notes (Signed)
    CHIEF COMPLAINT / HPI:  HTN: has questions about meds---she is taking several of them BID and is forgetting them on routine basis.Marland Kitchen No chest pain 2. Numbness in left fingertips, 1-5.  Worsening over last few omths until it is constantly numb now 3.left wrist "cyst".Present years. Wonders if it is associated with her fingertip numbness 4.Social: planning to retire.  REVIEW OF SYSTEMS: See hpi and additionally no lower extremity edema. No unusual SOB, no DOE. No PND. Mood is stable. Energy level is stable.  PERTINENT  PMH / PSH: I have reviewed the patient's medications, allergies, past medical and surgical history, smoking status and updated in the EMR as appropriate.   OBJECTIVE:  Vital signs reviewed. GENERAL: Well-developed, well-nourished, no acute distress. CARDIOVASCULAR: Regular rate and rhythm no murmur gallop or rub LUNGS: Clear to auscultation bilaterally, no rales or wheeze. ABDOMEN: Soft positive bowel sounds NEURO: decreased sensation volar surface of fingertips from DIp distaly on all digits of left hand. Rest of left hand and right hand have normal sensation to soft touch  MSK: Movement of extremity x 4.WRISTS: symmetrical with FROM. Deep within volar surface of left wrist is a mobile mass, about 1-2 cm in diameter. Feels like a ganglion cyst but deeper than surface level    ASSESSMENT / PLAN: Please see problem oriented charting for details

## 2018-01-26 NOTE — Assessment & Plan Note (Signed)
labs

## 2018-01-30 ENCOUNTER — Encounter: Payer: Self-pay | Admitting: Family Medicine

## 2018-01-30 ENCOUNTER — Other Ambulatory Visit: Payer: Self-pay | Admitting: Family Medicine

## 2018-01-30 DIAGNOSIS — R202 Paresthesia of skin: Secondary | ICD-10-CM

## 2018-01-30 DIAGNOSIS — R200 Anesthesia of skin: Secondary | ICD-10-CM

## 2018-02-06 ENCOUNTER — Other Ambulatory Visit: Payer: Self-pay | Admitting: Family Medicine

## 2018-02-28 ENCOUNTER — Encounter: Payer: Self-pay | Admitting: Family Medicine

## 2018-02-28 ENCOUNTER — Other Ambulatory Visit: Payer: Self-pay

## 2018-02-28 ENCOUNTER — Ambulatory Visit (INDEPENDENT_AMBULATORY_CARE_PROVIDER_SITE_OTHER): Payer: 59 | Admitting: Family Medicine

## 2018-02-28 VITALS — BP 108/64 | HR 71 | Temp 97.8°F | Ht 62.0 in | Wt 248.8 lb

## 2018-02-28 DIAGNOSIS — R202 Paresthesia of skin: Secondary | ICD-10-CM

## 2018-02-28 DIAGNOSIS — Z566 Other physical and mental strain related to work: Secondary | ICD-10-CM | POA: Diagnosis not present

## 2018-02-28 DIAGNOSIS — R200 Anesthesia of skin: Secondary | ICD-10-CM

## 2018-02-28 DIAGNOSIS — M25832 Other specified joint disorders, left wrist: Secondary | ICD-10-CM

## 2018-02-28 DIAGNOSIS — I10 Essential (primary) hypertension: Secondary | ICD-10-CM | POA: Diagnosis not present

## 2018-02-28 DIAGNOSIS — E1140 Type 2 diabetes mellitus with diabetic neuropathy, unspecified: Secondary | ICD-10-CM

## 2018-02-28 MED ORDER — IBUPROFEN 800 MG PO TABS: 800.0000 mg | ORAL_TABLET | Freq: Three times a day (TID) | ORAL | 3 refills | Status: DC | PRN

## 2018-03-01 LAB — VITAMIN D 25 HYDROXY (VIT D DEFICIENCY, FRACTURES): VIT D 25 HYDROXY: 14.2 ng/mL — AB (ref 30.0–100.0)

## 2018-03-01 LAB — TSH: TSH: 3.55 u[IU]/mL (ref 0.450–4.500)

## 2018-03-01 LAB — VITAMIN B12: VITAMIN B 12: 1627 pg/mL — AB (ref 232–1245)

## 2018-03-02 NOTE — Assessment & Plan Note (Signed)
Improved control with change to new ARB.  Unless she starts to develop some lightheadedness or dizziness, I think she seems to be tolerating the blood pressure in the 003 systolic over 64 diastolic well.  She will watch for symptoms and if they occur, she will let me know.  Otherwise we will leave her at this level

## 2018-03-02 NOTE — Assessment & Plan Note (Signed)
Still wonder if her ganglion cyst that is deep within her left wrist has anything to do with her hand numbness.  Will be interested to see what the neurologist says.

## 2018-03-02 NOTE — Progress Notes (Signed)
    CHIEF COMPLAINT / HPI: 1.  Follow-up hypertension: We had changed her ARB to a different brand.  She has felt great on this medication.  She notes her blood pressure tends to run a little bit lower but she has had no symptoms of dizziness or lightheadedness.  No cough. 2.  Continues to have fingertip numbness on the left hand.  The neurologist was unable to get her in for nerve conduction studies before August.  Concerned because she will have changed during that time. 3.  Social: She has 7-1/2 more days to work before she [employer].  She is quite excited.  She is going to get to do a lot of the things she is willing to do for a long time.  She says she is also going to start to exercise regularly.  REVIEW OF SYSTEMS: No chest pain, no lower extremity edema.  See HPI.  PERTINENT  PMH / PSH: I have reviewed the patient's medications, allergies, past medical and surgical history, smoking status and updated in the EMR as appropriate.   OBJECTIVE: Vital signs reviewed. GENERAL: Well-developed, well-nourished, no acute distress. CARDIOVASCULAR: Regular rate and rhythm no murmur gallop or rub LUNGS: Clear to auscultation bilaterally, no rales or wheeze. ABDOMEN: Soft positive bowel sounds NEURO: Numbness and lack of soft touch sensation on the distal phalanx portion of the volar thumb and first through fourth finger of the left hand.  Includes some portion of the dorsal surface.  It does not extend beyond the DIP joint on any of the affected phalanges. Skin: Skin around the area of the left hand is without any unusual bruising, redness or lesion. MSK: Movement of extremity x 4.    ASSESSMENT / PLAN: Please see problem oriented charting for details

## 2018-03-02 NOTE — Assessment & Plan Note (Signed)
She is 1 week away from retirement

## 2018-03-09 ENCOUNTER — Encounter: Payer: Self-pay | Admitting: Family Medicine

## 2018-07-14 ENCOUNTER — Other Ambulatory Visit: Payer: Self-pay | Admitting: Family Medicine

## 2018-07-16 DIAGNOSIS — R079 Chest pain, unspecified: Secondary | ICD-10-CM | POA: Diagnosis not present

## 2018-07-16 DIAGNOSIS — I213 ST elevation (STEMI) myocardial infarction of unspecified site: Secondary | ICD-10-CM | POA: Diagnosis not present

## 2018-07-16 DIAGNOSIS — Z7982 Long term (current) use of aspirin: Secondary | ICD-10-CM | POA: Diagnosis not present

## 2018-07-16 DIAGNOSIS — R110 Nausea: Secondary | ICD-10-CM | POA: Diagnosis not present

## 2018-07-16 DIAGNOSIS — R0602 Shortness of breath: Secondary | ICD-10-CM | POA: Diagnosis not present

## 2018-07-16 DIAGNOSIS — E785 Hyperlipidemia, unspecified: Secondary | ICD-10-CM | POA: Diagnosis not present

## 2018-07-16 DIAGNOSIS — E119 Type 2 diabetes mellitus without complications: Secondary | ICD-10-CM | POA: Diagnosis not present

## 2018-07-16 DIAGNOSIS — E559 Vitamin D deficiency, unspecified: Secondary | ICD-10-CM | POA: Diagnosis not present

## 2018-07-16 DIAGNOSIS — Z836 Family history of other diseases of the respiratory system: Secondary | ICD-10-CM | POA: Diagnosis not present

## 2018-07-16 DIAGNOSIS — I368 Other nonrheumatic tricuspid valve disorders: Secondary | ICD-10-CM | POA: Diagnosis not present

## 2018-07-16 DIAGNOSIS — Z809 Family history of malignant neoplasm, unspecified: Secondary | ICD-10-CM | POA: Diagnosis not present

## 2018-07-16 DIAGNOSIS — Z7984 Long term (current) use of oral hypoglycemic drugs: Secondary | ICD-10-CM | POA: Diagnosis not present

## 2018-07-16 DIAGNOSIS — F419 Anxiety disorder, unspecified: Secondary | ICD-10-CM | POA: Diagnosis not present

## 2018-07-16 DIAGNOSIS — Z823 Family history of stroke: Secondary | ICD-10-CM | POA: Diagnosis not present

## 2018-07-16 DIAGNOSIS — I2510 Atherosclerotic heart disease of native coronary artery without angina pectoris: Secondary | ICD-10-CM | POA: Diagnosis not present

## 2018-07-16 DIAGNOSIS — R0789 Other chest pain: Secondary | ICD-10-CM | POA: Diagnosis not present

## 2018-07-16 DIAGNOSIS — K219 Gastro-esophageal reflux disease without esophagitis: Secondary | ICD-10-CM | POA: Diagnosis not present

## 2018-07-16 DIAGNOSIS — Z8249 Family history of ischemic heart disease and other diseases of the circulatory system: Secondary | ICD-10-CM | POA: Diagnosis not present

## 2018-07-16 DIAGNOSIS — Z79899 Other long term (current) drug therapy: Secondary | ICD-10-CM | POA: Diagnosis not present

## 2018-07-16 DIAGNOSIS — I2119 ST elevation (STEMI) myocardial infarction involving other coronary artery of inferior wall: Secondary | ICD-10-CM | POA: Diagnosis not present

## 2018-07-16 DIAGNOSIS — D649 Anemia, unspecified: Secondary | ICD-10-CM | POA: Diagnosis not present

## 2018-07-16 DIAGNOSIS — I2111 ST elevation (STEMI) myocardial infarction involving right coronary artery: Secondary | ICD-10-CM | POA: Diagnosis not present

## 2018-07-16 DIAGNOSIS — Z6841 Body Mass Index (BMI) 40.0 and over, adult: Secondary | ICD-10-CM | POA: Diagnosis not present

## 2018-07-16 DIAGNOSIS — I10 Essential (primary) hypertension: Secondary | ICD-10-CM | POA: Diagnosis not present

## 2018-07-16 DIAGNOSIS — Z87891 Personal history of nicotine dependence: Secondary | ICD-10-CM | POA: Diagnosis not present

## 2018-07-16 DIAGNOSIS — R0600 Dyspnea, unspecified: Secondary | ICD-10-CM | POA: Diagnosis not present

## 2018-07-16 DIAGNOSIS — Z23 Encounter for immunization: Secondary | ICD-10-CM | POA: Diagnosis not present

## 2018-07-17 ENCOUNTER — Telehealth: Payer: Self-pay | Admitting: Family Medicine

## 2018-07-17 DIAGNOSIS — Z6841 Body Mass Index (BMI) 40.0 and over, adult: Secondary | ICD-10-CM | POA: Diagnosis not present

## 2018-07-17 DIAGNOSIS — I2111 ST elevation (STEMI) myocardial infarction involving right coronary artery: Secondary | ICD-10-CM | POA: Diagnosis not present

## 2018-07-17 DIAGNOSIS — E119 Type 2 diabetes mellitus without complications: Secondary | ICD-10-CM | POA: Diagnosis not present

## 2018-07-17 DIAGNOSIS — I2119 ST elevation (STEMI) myocardial infarction involving other coronary artery of inferior wall: Secondary | ICD-10-CM | POA: Diagnosis not present

## 2018-07-17 DIAGNOSIS — I368 Other nonrheumatic tricuspid valve disorders: Secondary | ICD-10-CM | POA: Diagnosis not present

## 2018-07-17 NOTE — Telephone Encounter (Signed)
Attempted to contact pt to remind them of their upcoming appt on 07/18/2018. Work number no longer connected; used mobile. -[place]

## 2018-07-18 ENCOUNTER — Ambulatory Visit: Payer: 59 | Admitting: Family Medicine

## 2018-07-18 ENCOUNTER — Telehealth: Payer: Self-pay | Admitting: Family Medicine

## 2018-07-18 DIAGNOSIS — I2119 ST elevation (STEMI) myocardial infarction involving other coronary artery of inferior wall: Secondary | ICD-10-CM | POA: Diagnosis not present

## 2018-07-18 DIAGNOSIS — I2111 ST elevation (STEMI) myocardial infarction involving right coronary artery: Secondary | ICD-10-CM | POA: Diagnosis not present

## 2018-07-18 DIAGNOSIS — Z6841 Body Mass Index (BMI) 40.0 and over, adult: Secondary | ICD-10-CM | POA: Diagnosis not present

## 2018-07-18 DIAGNOSIS — E119 Type 2 diabetes mellitus without complications: Secondary | ICD-10-CM | POA: Diagnosis not present

## 2018-07-18 NOTE — Telephone Encounter (Signed)
Pt called to cancel her appointment this morning with Dr. Nori Riis. She wanted me to let her know that she had a heart attack Monday evening and is admitted at [facility] in [place].

## 2018-07-18 NOTE — Telephone Encounter (Signed)
Left message on her vm

## 2018-07-19 MED ORDER — ATORVASTATIN CALCIUM 80 MG PO TABS
80.00 | ORAL_TABLET | ORAL | Status: DC
Start: 2018-07-18 — End: 2018-07-19

## 2018-07-19 MED ORDER — ENOXAPARIN SODIUM 40 MG/0.4ML ~~LOC~~ SOLN
40.00 | SUBCUTANEOUS | Status: DC
Start: 2018-07-19 — End: 2018-07-19

## 2018-07-19 MED ORDER — HYDROCODONE-ACETAMINOPHEN 5-325 MG PO TABS
1.00 | ORAL_TABLET | ORAL | Status: DC
Start: ? — End: 2018-07-19

## 2018-07-19 MED ORDER — POTASSIUM CHLORIDE CRYS ER 20 MEQ PO TBCR
20.00 | EXTENDED_RELEASE_TABLET | ORAL | Status: DC
Start: 2018-07-18 — End: 2018-07-19

## 2018-07-19 MED ORDER — CANDESARTAN CILEXETIL 16 MG PO TABS
32.00 | ORAL_TABLET | ORAL | Status: DC
Start: 2018-07-19 — End: 2018-07-19

## 2018-07-19 MED ORDER — ALUMINUM-MAGNESIUM-SIMETHICONE 200-200-20 MG/5ML PO SUSP
20.00 | ORAL | Status: DC
Start: ? — End: 2018-07-19

## 2018-07-19 MED ORDER — INSULIN LISPRO 100 UNIT/ML ~~LOC~~ SOLN
.00 | SUBCUTANEOUS | Status: DC
Start: 2018-07-18 — End: 2018-07-19

## 2018-07-19 MED ORDER — NITROGLYCERIN 0.4 MG SL SUBL
0.40 | SUBLINGUAL_TABLET | SUBLINGUAL | Status: DC
Start: ? — End: 2018-07-19

## 2018-07-19 MED ORDER — DEXTROSE 50 % IV SOLN
25.00 | INTRAVENOUS | Status: DC
Start: ? — End: 2018-07-19

## 2018-07-19 MED ORDER — ASPIRIN 81 MG PO CHEW
81.00 | CHEWABLE_TABLET | ORAL | Status: DC
Start: 2018-07-19 — End: 2018-07-19

## 2018-07-19 MED ORDER — LORAZEPAM 1 MG PO TABS
1.00 | ORAL_TABLET | ORAL | Status: DC
Start: ? — End: 2018-07-19

## 2018-07-19 MED ORDER — ACETAMINOPHEN 325 MG PO TABS
325.00 | ORAL_TABLET | ORAL | Status: DC
Start: ? — End: 2018-07-19

## 2018-07-19 MED ORDER — PRASUGREL HCL 10 MG PO TABS
10.00 | ORAL_TABLET | ORAL | Status: DC
Start: 2018-07-19 — End: 2018-07-19

## 2018-07-19 MED ORDER — ZOLPIDEM TARTRATE 5 MG PO TABS
5.00 | ORAL_TABLET | ORAL | Status: DC
Start: ? — End: 2018-07-19

## 2018-07-19 MED ORDER — HYDRALAZINE HCL 20 MG/ML IJ SOLN
10.00 | INTRAMUSCULAR | Status: DC
Start: ? — End: 2018-07-19

## 2018-07-19 MED ORDER — METOPROLOL SUCCINATE ER 100 MG PO TB24
100.00 | ORAL_TABLET | ORAL | Status: DC
Start: 2018-07-19 — End: 2018-07-19

## 2018-07-19 MED ORDER — FUROSEMIDE 20 MG PO TABS
20.00 | ORAL_TABLET | ORAL | Status: DC
Start: 2018-07-18 — End: 2018-07-19

## 2018-07-19 MED ORDER — MORPHINE SULFATE 10 MG/ML IJ SOLN
2.00 | INTRAMUSCULAR | Status: DC
Start: ? — End: 2018-07-19

## 2018-07-19 MED ORDER — ONDANSETRON HCL 4 MG/2ML IJ SOLN
4.00 | INTRAMUSCULAR | Status: DC
Start: ? — End: 2018-07-19

## 2018-07-19 NOTE — Progress Notes (Signed)
Cardiology Office Note   Date:  07/20/2018   ID:  Pamela Thomas, DOB 1956/03/09, MRN [id number]  PCP:  Dickie La, MD  Cardiologist:   No primary care provider on file.   Chief Complaint  Patient presents with  . Coronary Artery Disease      History of Present Illness: Pamela Thomas is a 62 y.o. female who presents for evaluation of coronary disease.  She had an inferior infarct on Monday.  She was visiting and taking care of her grandchild in [place].  She been having some jaw discomfort for about a month in retrospect.  She developed some substernal discomfort that was significant and persistent and presented to the emergency room.  I was able to review these records.  She was having an acute inferior infarct.  She had an occluded right coronary artery.  She also had LAD 80% stenosis proximally at the takeoff of a prominent and tortuous diagonal branch.  Circumflex is very small and free of high-grade disease.  Her EF was well preserved with some probable mild inferior hypokinesis.  She had overlapping Synergy stents.  It was suggested that she have elective PCI of her LAD versus a LIMA.  She was sent back here for consideration of that.  Since going home she is been quiet and not doing very much.  She has not been having any cardiovascular symptoms however.  A couple of may be palpitations but she is not having any of the chest discomfort that she was having because she has not been having any jaw discomfort.  She has no new shortness of breath, PND or orthopnea.  She has had no presyncope or syncope.  She saw Dr. Johnsie Cancel in 2012 for evaluation of bradycardia.  She had normal LV on echo and she had a POET (Plain Old Exercise Treadmill) with ischemia.  She does have mild AS.      Past Medical History:  Diagnosis Date  . ALLERGIC RHINITIS, SEASONAL   . Diabetes mellitus without complication ([place])   . DYSLIPIDEMIA   . Dysrhythmia    palpitations-evaluated by Dr Johnsie Cancel  6/12/eccho 6/13  . Glucose intolerance (impaired glucose tolerance)   . History of blood transfusion   . Hypertension   . OBESITY, NOS   . Osteoarthritis    rt knee  . PLANTAR FASCIITIS, BILATERAL   . Vertigo     Past Surgical History:  Procedure Laterality Date  . BREAST BIOPSY  05/27/03   left  . CARPAL TUNNEL RELEASE  05/27/03   Right  . CESAREAN SECTION  05/27/03  . KNEE SURGERY     left- removal fatty tumor  . ROTATOR CUFF REPAIR     Bilateral  . TOTAL KNEE ARTHROPLASTY  10/10/2012   Procedure: TOTAL KNEE ARTHROPLASTY;  Surgeon: Gearlean Alf, MD;  Location: [facility];  Service: Orthopedics;  Laterality: Right;     Current Outpatient Medications  Medication Sig Dispense Refill  . aspirin 81 MG tablet Take 81 mg by mouth daily.    Marland Kitchen atorvastatin (LIPITOR) 80 MG tablet Take 80 mg by mouth daily.    . candesartan (ATACAND) 32 MG tablet Take 1 tablet (32 mg total) by mouth daily. 90 tablet 3  . FLUoxetine (PROZAC) 20 MG tablet TAKE 1 TABLET BY MOUTH EVERY DAY 90 tablet 3  . furosemide (LASIX) 20 MG tablet Take 1 tablet (20 mg total) by mouth 2 (two) times daily. Patient does not want to split a  40 mg  Tab so please do not substitute 180 tablet 3  . metFORMIN (GLUCOPHAGE) 1000 MG tablet Take 1 tablet (1,000 mg total) by mouth 2 (two) times daily with a meal. 180 tablet 3  . metoprolol succinate (TOPROL-XL) 50 MG 24 hr tablet TAKE 1 TABLET BY MOUTH TWICE A DAY 180 tablet 3  . Multiple Vitamins-Minerals (VITAMIN D3 COMPLETE PO) Take 1 tablet by mouth daily.    . potassium chloride SA (KLOR-CON M20) 20 MEQ tablet Take 1 tablet (20 mEq total) by mouth daily. 90 tablet 3  . prasugrel (EFFIENT) 10 MG TABS tablet Take 10 mg by mouth daily.     No current facility-administered medications for this visit.     Allergies:   Patient has no allergy information on record.    Social History:  The patient  reports that she quit smoking about 27 years ago. She has never used smokeless  tobacco. She reports that she drinks alcohol. She reports that she does not use drugs.   Family History:  The patient's family history includes COPD in her brother and mother; Diabetes in her unknown relative; Healthy in her daughter; Lung cancer in her father; Melanoma in her brother.    ROS:  Please see the history of present illness.   Otherwise, review of systems are positive for none.   All other systems are reviewed and negative.    PHYSICAL EXAM: VS:  BP 134/78 (BP Location: Left Arm, Patient Position: Sitting, Cuff Size: Large)   Pulse 70   Ht 5\' 2"  (1.575 m)   Wt 242 lb (109.8 kg)   LMP 10/10/2010   BMI 44.26 kg/m  , BMI Body mass index is 44.26 kg/m. GENERAL:  Well appearing HEENT:  Pupils equal round and reactive, fundi not visualized, oral mucosa unremarkable NECK:  No jugular venous distention, waveform within normal limits, carotid upstroke brisk and symmetric, no bruits, no thyromegaly LYMPHATICS:  No cervical, inguinal adenopathy LUNGS:  Clear to auscultation bilaterally BACK:  No CVA tenderness CHEST:  Unremarkable HEART:  PMI not displaced or sustained,S1 and S2 within normal limits, no S3, no S4, no clicks, no rubs, no murmurs ABD:  Flat, positive bowel sounds normal in frequency in pitch, no bruits, no rebound, no guarding, no midline pulsatile mass, no hepatomegaly, no splenomegaly EXT:  2 plus pulses throughout, no edema, no cyanosis no clubbing SKIN:  No rashes no nodules NEURO:  Cranial nerves II through XII grossly intact, motor grossly intact throughout PSYCH:  Cognitively intact, oriented to person place and time    EKG:  EKG is ordered today. The ekg ordered today demonstrates sinus rhythm, rate 70, axis within normal limits, intervals within normal limits, inferior T wave inversion evidence of the recent infarct, poor anterior R wave progression.   Recent Labs: 01/24/2018: ALT 13; BUN 11; Creatinine, Ser 0.78; Potassium 4.7; Sodium 142 02/28/2018: TSH  3.550    Lipid Panel    Component Value Date/Time   CHOL 220 (H) 05/11/2011 0932   TRIG 576 (H) 05/11/2011 0932   HDL 40 05/11/2011 0932   CHOLHDL 5.5 05/11/2011 0932   VLDL NOT CALC 05/11/2011 0932   LDLCALC  05/11/2011 0932     Comment:       Not calculated due to Triglyceride >400. Suggest ordering Direct LDL (Unit Code: [phone]).   Total Cholesterol/HDL Ratio:CHD Risk  Coronary Heart Disease Risk Table                                        Men       Women          1/2 Average Risk              3.4        3.3              Average Risk              5.0        4.4           2X Average Risk              9.6        7.1           3X Average Risk             23.4       11.0 Use the calculated Patient Ratio above and the CHD Risk table  to determine the patient's CHD Risk. ATP III Classification (LDL):       < 100        mg/dL         Optimal      100 - 129     mg/dL         Near or Above Optimal      130 - 159     mg/dL         Borderline High      160 - 189     mg/dL         High       > 190        mg/dL         Very High     LDLDIRECT 123 (H) 01/24/2018 0937   LDLDIRECT 76 07/27/2016 1048      Wt Readings from Last 3 Encounters:  07/20/18 242 lb (109.8 kg)  02/28/18 248 lb 12.8 oz (112.9 kg)  01/24/18 248 lb 9.6 oz (112.8 kg)      Other studies Reviewed: Additional studies/ records that were reviewed today include: Multiple [facility] records. Review of the above records demonstrates:  Please see elsewhere in the note.     ASSESSMENT AND PLAN:  ACUTE INFERIOR MI: The patient is doing well.  She is not having any ongoing symptoms.  However, she will need consideration for elective management of her LAD stenosis.  I am going to review the images with my interventional partners and make a decision about further appropriate therapy.  Until that time the patient is on current medical regimen and she will continue with plans for aggressive secondary risk  reduction.  DYSLIPIDEMIA:     She had significant hypertriglyceridemia with a level of 850.  Total cholesterol is 245.  HDL was 31.  I am actually can refer her to Dr. Debara Pickett.   Aortic stenosis:  She has mild AS.  There was actually no evidence of this on the recent echo.  I reviewed this report.  DM: Her A1c is 6.9.  No change in therapy.  HTN: Blood pressure slightly elevated above target but for now she will continue the meds as listed.  OBESITY: We will work on this with diet and exercise and I discussed the [place].  Current medicines are reviewed at length with the patient today.  The patient does not have concerns regarding medicines.  The following changes have been made:  no change  Labs/ tests ordered today include: None  Orders Placed This Encounter  Procedures  . EKG 12-Lead     Disposition:   FU with me in one month.     Signed, Minus Breeding, MD  07/20/2018 3:49 PM    [facility]

## 2018-07-20 ENCOUNTER — Encounter: Payer: Self-pay | Admitting: Cardiology

## 2018-07-20 ENCOUNTER — Ambulatory Visit: Payer: BLUE CROSS/BLUE SHIELD | Admitting: Cardiology

## 2018-07-20 VITALS — BP 134/78 | HR 70 | Ht 62.0 in | Wt 242.0 lb

## 2018-07-20 DIAGNOSIS — I10 Essential (primary) hypertension: Secondary | ICD-10-CM

## 2018-07-20 DIAGNOSIS — I2119 ST elevation (STEMI) myocardial infarction involving other coronary artery of inferior wall: Secondary | ICD-10-CM

## 2018-07-20 DIAGNOSIS — E118 Type 2 diabetes mellitus with unspecified complications: Secondary | ICD-10-CM

## 2018-07-20 DIAGNOSIS — E785 Hyperlipidemia, unspecified: Secondary | ICD-10-CM | POA: Diagnosis not present

## 2018-07-20 NOTE — Patient Instructions (Addendum)
Medication Instructions:  Continue current medications  If you need a refill on your cardiac medications before your next appointment, please call your pharmacy.  Labwork: None Ordered   If you have labs (blood work) drawn today and your tests are completely normal, you will receive your results only by: Marland Kitchen MyChart Message (if you have MyChart) OR . A paper copy in the mail If you have any lab test that is abnormal or we need to change your treatment, we will call you to review the results.  Testing/Procedures: None Ordered  Follow-Up: You have been referred to Lipid Clinic with Dr Debara Pickett  . You will need a follow up appointment in 1 Months with Dr Percival Spanish.    At [facility], you and your health needs are our priority.  As part of our continuing mission to provide you with exceptional heart care, we have created designated Provider Care Teams.  These Care Teams include your primary Cardiologist (physician) and Advanced Practice Providers (APPs -  Physician Assistants and Nurse Practitioners) who all work together to provide you with the care you need, when you need it.   Thank you for choosing [facility] at [facility]!!

## 2018-07-23 ENCOUNTER — Encounter: Payer: Self-pay | Admitting: Family Medicine

## 2018-07-23 DIAGNOSIS — I2510 Atherosclerotic heart disease of native coronary artery without angina pectoris: Secondary | ICD-10-CM

## 2018-07-24 ENCOUNTER — Telehealth (HOSPITAL_COMMUNITY): Payer: Self-pay | Admitting: *Deleted

## 2018-07-24 NOTE — Telephone Encounter (Signed)
-----   Message from Minus Breeding, MD sent at 07/24/2018 11:58 AM EST ----- Regarding: RE: Cardiac Rehab Not appropriate yet.   Needs further revascularization.    ----- Message ----- From: Rowe Pavy, RN Sent: 07/24/2018   9:04 AM EST To: Minus Breeding, MD Subject: Cardiac Rehab                                  Dr. Percival Spanish,  Received notification from [facility] for pt Cardiac event for possible Cardiac Rehab.  I see that she was seen by you on 07/20/18 to establish care.  Based upon your assessment, is this pt appropriate for Cardiac Rehab?  If so, please place order for Cardiac rehab.  We will contact pt for scheduling and insurance eligibility.  Thanks so much Psychologist, clinical, BSN Cardiac and Training and development officer

## 2018-07-24 NOTE — Telephone Encounter (Signed)
Spoke to [employer], Art therapist for MI at [facility] in [place].  Notification of referral for Cardiac Rehab.  Unsure of plans for continuing follow up. Pt was visiting her grandchildren when her cardiac event occurred.  Pt seen locally by Dr. Percival Spanish on 11/8.  The following in basket message sent to him:  Dr. Percival Spanish,  Received notification from [facility] for pt Cardiac event for possible Cardiac Rehab.  I see that she was seen by you on 07/20/18 to establish care.  Based upon your assessment, is this pt appropriate for Cardiac Rehab?  If so, please place order for Cardiac rehab.  We will contact pt for scheduling and insurance eligibility.  Thanks so much Psychologist, clinical, BSN Cardiac and Training and development officer

## 2018-07-24 NOTE — Telephone Encounter (Signed)
Referral send to Dr Servando Snare

## 2018-08-03 ENCOUNTER — Encounter: Payer: Self-pay | Admitting: Internal Medicine

## 2018-08-03 ENCOUNTER — Telehealth: Payer: Self-pay | Admitting: Internal Medicine

## 2018-08-03 ENCOUNTER — Ambulatory Visit: Payer: BLUE CROSS/BLUE SHIELD | Admitting: Internal Medicine

## 2018-08-03 VITALS — BP 132/78 | HR 70 | Ht 62.0 in | Wt 239.8 lb

## 2018-08-03 DIAGNOSIS — E118 Type 2 diabetes mellitus with unspecified complications: Secondary | ICD-10-CM | POA: Diagnosis not present

## 2018-08-03 DIAGNOSIS — I2119 ST elevation (STEMI) myocardial infarction involving other coronary artery of inferior wall: Secondary | ICD-10-CM

## 2018-08-03 DIAGNOSIS — E782 Mixed hyperlipidemia: Secondary | ICD-10-CM

## 2018-08-03 MED ORDER — ICOSAPENT ETHYL 1 G PO CAPS: 2.0000 g | ORAL_CAPSULE | Freq: Two times a day (BID) | ORAL | 11 refills | Status: DC

## 2018-08-03 NOTE — Patient Instructions (Signed)
Medication Instructions:  START vascepa 2 gram (2 capsules) twice daily If you need a refill on your cardiac medications before your next appointment, please call your pharmacy.   Lab work: FASTING lab work in 3-4 months (prior to next visit) If you have labs (blood work) drawn today and your tests are completely normal, you will receive your results only by: Marland Kitchen MyChart Message (if you have MyChart) OR . A paper copy in the mail If you have any lab test that is abnormal or we need to change your treatment, we will call you to review the results.  Testing/Procedures: NONE  Follow-Up: At [facility], you and your health needs are our priority.  As part of our continuing mission to provide you with exceptional heart care, we have created designated Provider Care Teams.  These Care Teams include your primary Cardiologist (physician) and Advanced Practice Providers (APPs -  Physician Assistants and Nurse Practitioners) who all work together to provide you with the care you need, when you need it. You will need a follow up appointment in 3-4 months with Dr. Debara Pickett in the lipid clinic  Any Other Special Instructions Will Be Listed Below (If Applicable).

## 2018-08-03 NOTE — Progress Notes (Addendum)
LIPID CLINIC CONSULT NOTE  Chief Complaint:  Dyslipidemia  Primary Care Physician: Dickie La, MD  HPI:  Pamela Thomas is a 62 y.o. female who is being seen today for the evaluation of lipidemia at the request of Dickie La, MD.  This is a pleasant 62 year old female who unfortunately suffered a recent acute anterior MI.  She was babysitting her granddaughter in the [place] area and presented with acute chest pain.  She was found by cardiac catheterization to have an occluded right coronary artery as well as severe LAD disease.  Ultimately she was considered for coronary artery bypass grafting however wish to have further evaluation here in [place].  She is a patient of Dr. Percival Spanish who has referred her to see Dr. Servando Snare next week for surgical evaluation.  In addition to this she has a long-standing history of elevated triglycerides and cholesterol.  Last year her triglycerides were as high as 850.  Recently she had lab work at [facility] which showed total cholesterol of 188, triglycerides 403 and HDL 33.  LDL was not calculated.  A direct LDL from earlier this year was 123.  She has not been on statin therapy.  She was appropriately started on high intensity atorvastatin 80 mg for her acute MI which she is tolerating.  She is also on dual antiplatelet therapy and a beta-blocker.  She does report a fairly atherogenic diet and is morbidly obese.  She says she has been trying to work on dietary changes and weight loss to help with her numbers.  She is also type II diabetic on metformin.  PMHx:  Past Medical History:  Diagnosis Date  . ALLERGIC RHINITIS, SEASONAL   . Diabetes mellitus without complication ([place])   . DYSLIPIDEMIA   . Dysrhythmia    palpitations-evaluated by Dr Johnsie Cancel 6/12/eccho 6/13  . Glucose intolerance (impaired glucose tolerance)   . History of blood transfusion   . Hypertension   . OBESITY, NOS   . Osteoarthritis    rt knee  . PLANTAR FASCIITIS, BILATERAL   .  Vertigo     Past Surgical History:  Procedure Laterality Date  . BREAST BIOPSY  05/27/03   left  . CARPAL TUNNEL RELEASE  05/27/03   Right  . CESAREAN SECTION  05/27/03  . KNEE SURGERY     left- removal fatty tumor  . ROTATOR CUFF REPAIR     Bilateral  . TOTAL KNEE ARTHROPLASTY  10/10/2012   Procedure: TOTAL KNEE ARTHROPLASTY;  Surgeon: Gearlean Alf, MD;  Location: [facility];  Service: Orthopedics;  Laterality: Right;    FAMHx:  Family History  Problem Relation Age of Onset  . COPD Mother        Deceased  . Lung cancer Father        Deceaesd  . Diabetes Unknown   . Melanoma Brother        Deceased  . COPD Brother   . Healthy Daughter     SOCHx:   reports that she quit smoking about 27 years ago. She has never used smokeless tobacco. She reports that she drinks alcohol. She reports that she does not use drugs.  ALLERGIES:  Not on File  ROS: Pertinent items noted in HPI and remainder of comprehensive ROS otherwise negative.  HOME MEDS: Current Outpatient Medications on File Prior to Visit  Medication Sig Dispense Refill  . aspirin 81 MG tablet Take 81 mg by mouth daily.    Marland Kitchen atorvastatin (LIPITOR) 80 MG  tablet Take 80 mg by mouth daily.    . candesartan (ATACAND) 32 MG tablet Take 1 tablet (32 mg total) by mouth daily. 90 tablet 3  . FLUoxetine (PROZAC) 20 MG tablet TAKE 1 TABLET BY MOUTH EVERY DAY 90 tablet 3  . furosemide (LASIX) 20 MG tablet Take 1 tablet (20 mg total) by mouth 2 (two) times daily. Patient does not want to split a 40 mg  Tab so please do not substitute 180 tablet 3  . Melatonin 10 MG CAPS Take by mouth at bedtime as needed.    . metFORMIN (GLUCOPHAGE) 1000 MG tablet Take 1 tablet (1,000 mg total) by mouth 2 (two) times daily with a meal. 180 tablet 3  . metoprolol succinate (TOPROL-XL) 50 MG 24 hr tablet TAKE 1 TABLET BY MOUTH TWICE A DAY 180 tablet 3  . Multiple Vitamins-Minerals (VITAMIN D3 COMPLETE PO) Take 1 tablet by mouth daily.    . potassium  chloride SA (KLOR-CON M20) 20 MEQ tablet Take 1 tablet (20 mEq total) by mouth daily. 90 tablet 3  . prasugrel (EFFIENT) 10 MG TABS tablet Take 10 mg by mouth daily.     No current facility-administered medications on file prior to visit.     LABS/IMAGING: No results found for this or any previous visit (from the past 48 hour(s)). No results found.  LIPID PANEL:    Component Value Date/Time   CHOL 220 (H) 05/11/2011 0932   TRIG 576 (H) 05/11/2011 0932   HDL 40 05/11/2011 0932   CHOLHDL 5.5 05/11/2011 0932   VLDL NOT CALC 05/11/2011 0932   LDLCALC  05/11/2011 0932     Comment:       Not calculated due to Triglyceride >400. Suggest ordering Direct LDL (Unit Code: [phone]).   Total Cholesterol/HDL Ratio:CHD Risk                        Coronary Heart Disease Risk Table                                        Men       Women          1/2 Average Risk              3.4        3.3              Average Risk              5.0        4.4           2X Average Risk              9.6        7.1           3X Average Risk             23.4       11.0 Use the calculated Patient Ratio above and the CHD Risk table  to determine the patient's CHD Risk. ATP III Classification (LDL):       < 100        mg/dL         Optimal      100 - 129     mg/dL         Near or Above Optimal  130 - 159     mg/dL         Borderline High      160 - 189     mg/dL         High       > 190        mg/dL         Very High     LDLDIRECT 123 (H) 01/24/2018 0937   LDLDIRECT 76 07/27/2016 1048    WEIGHTS: Wt Readings from Last 3 Encounters:  08/03/18 239 lb 12.8 oz (108.8 kg)  07/20/18 242 lb (109.8 kg)  02/28/18 248 lb 12.8 oz (112.9 kg)    VITALS: BP 132/78   Pulse 70   Ht 5\' 2"  (1.575 m)   Wt 239 lb 12.8 oz (108.8 kg)   LMP 10/10/2010   BMI 43.86 kg/m   EXAM: General appearance: alert, no distress and morbidly obese Neck: no carotid bruit, no JVD and thyroid not enlarged, symmetric, no  tenderness/mass/nodules Lungs: diminished breath sounds bibasilar Heart: regular rate and rhythm Abdomen: soft, non-tender; bowel sounds normal; no masses,  no organomegaly and obese Extremities: extremities normal, atraumatic, no cyanosis or edema Pulses: 2+ and symmetric Skin: Skin color, texture, turgor normal. No rashes or lesions Neurologic: Grossly normal Psych: Pleasant  EKG: Deferred  ASSESSMENT: 1. Mixed dyslipidemia (elevated LDL and high triglycerides, low HDL) 2. Morbid obesity 3. Type 2 diabetes 4. Recent acute inferior MI  PLAN: 1.   Pamela Thomas had a recent acute MI and may need CABG. she has had severely elevated triglycerides in fact last year over 800.  More recently her triglycerides were 403 but direct LDL was 123 earlier this year.  She is appropriately now on high intensity statin.  She is also a good candidate for Vascepa.  Is based on the reduce it trial data which was unanimously recommended by the FDA to be approved probably later this month.  We will plan to add Vascepa 2 g twice daily your current regimen.  Repeat a lipid profile in 3 months with direct LDL.  In addition we provided dietary information for lowering LDL and triglycerides as well as general heart health.  Weight loss and dietary changes will be instrumental in lowering her risk.  Thanks for the kind referral.  Pixie Casino, MD, FACC, [place] Director of the [facility] Diplomate of the American Board of Clinical Lipidology Attending Cardiologist  Direct Dial: [phone]  Fax: [phone]  Website:  [email or web address] 08/03/2018, 10:47 AM

## 2018-08-03 NOTE — Telephone Encounter (Signed)
PA for vascepa submitted via [email or web address] Key: [id number] - PA Case ID: [id number] - Rx #: [id number]

## 2018-08-06 ENCOUNTER — Other Ambulatory Visit: Payer: Self-pay | Admitting: Family Medicine

## 2018-08-06 MED ORDER — ASPIRIN 81 MG PO TABS: 81.0000 mg | ORAL_TABLET | Freq: Every day | ORAL | 3 refills | Status: DC

## 2018-08-07 ENCOUNTER — Institutional Professional Consult (permissible substitution): Payer: BLUE CROSS/BLUE SHIELD | Admitting: Cardiothoracic Surgery

## 2018-08-07 VITALS — BP 126/72 | HR 64 | Resp 20 | Ht 62.0 in | Wt 237.0 lb

## 2018-08-07 DIAGNOSIS — I2510 Atherosclerotic heart disease of native coronary artery without angina pectoris: Secondary | ICD-10-CM

## 2018-08-07 NOTE — Telephone Encounter (Signed)
Received notice from [facility] that patient has NOT been approved for Vascepa. They may consider approval of this drug when used in a certain situation (when TG are greater than/equal to 500mg /dL)  MD can discuss this decision with clinical reviewer by calling [phone]  Additional information related to this case can be faxed to [phone] with the following: Patient name, reference # ([id number]), medication name, date of service, member policy ID #

## 2018-08-07 NOTE — Progress Notes (Signed)
[address]       [place] [address]             [phone]                    Moranda A [facility] [place] Medical Record #[id number] Date of Birth: [birth year]  Referring: Minus Breeding, MD Primary Care: Dickie La, MD Primary Cardiologist: No primary care provider on file.  Chief Complaint:    Chief Complaint  Patient presents with  . Coronary Artery Disease    Surgical eval, Cardiac Cath 07/18/18, ECHO 07/17/18    History of Present Illness:    Pamela Thomas 62 y.o. female is seen in the office  today for evaluation for coronary artery bypass grafting.  The patient noted the 2 to 3 months ago she began having trouble sleeping, increasing shortness of breath, orthopnea, and aching in her jaw.  These symptoms culminated in an acute inferior myocardial infarction on November 4 while she was in [place].  At the time emergency cardiac catheterization showed a totally occluded right coronary artery.  This was treated with angioplasty and stent placement, see full note below.  In addition she was noted to have significant LAD diagonal disease and a very small nondominant circumflex.  She was discharged home from [facility] on Effient and referred to Dr. Percival Spanish.   She notes since the acute intervention she "feels like new", the sleeping difficulty has improved, she denies angina or orthopnea.  She she does note that her activity level has not been as high on purpose.  The patient has a history of diabetes mellitus for the past 8 years currently on metformin.  She has a distant history of cigarette use but quit in 1987 when her father was diagnosed with lung cancer.       Current Activity/ Functional Status:  Patient is independent with mobility/ambulation, transfers, ADL's, IADL's.   Zubrod Score: At the time of surgery this patient's most appropriate activity status/level should be described as: []     0    Normal activity, no symptoms [x]     1     Restricted in physical strenuous activity but ambulatory, able to do out light work []     2    Ambulatory and capable of self care, unable to do work activities, up and about               >50 % of waking hours                              []     3    Only limited self care, in bed greater than 50% of waking hours []     4    Completely disabled, no self care, confined to bed or chair []     5    Moribund   Past Medical History:  Diagnosis Date  . ALLERGIC RHINITIS, SEASONAL   . Diabetes mellitus without complication ([place])   . DYSLIPIDEMIA   . Dysrhythmia    palpitations-evaluated by Dr Johnsie Cancel 6/12/eccho 6/13  . Glucose intolerance (impaired glucose tolerance)   . History of blood transfusion   . Hypertension   . OBESITY, NOS   . Osteoarthritis    rt knee  . PLANTAR FASCIITIS, BILATERAL   . Vertigo     Past Surgical History:  Procedure Laterality Date  . BREAST BIOPSY  05/27/03  left  . CARPAL TUNNEL RELEASE  05/27/03   Right  . CESAREAN SECTION  05/27/03  . KNEE SURGERY     left- removal fatty tumor  . ROTATOR CUFF REPAIR     Bilateral  . TOTAL KNEE ARTHROPLASTY  10/10/2012   Procedure: TOTAL KNEE ARTHROPLASTY;  Surgeon: Gearlean Alf, MD;  Location: [facility];  Service: Orthopedics;  Laterality: Right;    Family History  Problem Relation Age of Onset  . COPD Mother        Deceased  . Lung cancer Father        Deceaesd  . Diabetes Unknown   . Melanoma Brother        Deceased  . COPD Brother   . Healthy Daughter      Social History   Tobacco Use  Smoking Status Former Smoker  . Last attempt to quit: 09/12/1990  . Years since quitting: 27.9  Smokeless Tobacco Never Used    Social History   Substance and Sexual Activity  Alcohol Use Yes  . Alcohol/week: 0.0 standard drinks   Comment: rare     No Known Allergies  Current Outpatient Medications  Medication Sig Dispense Refill  . aspirin 81 MG tablet Take 1 tablet (81 mg total) by mouth daily. 100 tablet 3    . atorvastatin (LIPITOR) 80 MG tablet Take 80 mg by mouth daily.    . candesartan (ATACAND) 32 MG tablet Take 1 tablet (32 mg total) by mouth daily. 90 tablet 3  . FLUoxetine (PROZAC) 20 MG tablet TAKE 1 TABLET BY MOUTH EVERY DAY 90 tablet 3  . furosemide (LASIX) 20 MG tablet Take 1 tablet (20 mg total) by mouth 2 (two) times daily. Patient does not want to split a 40 mg  Tab so please do not substitute 180 tablet 3  . Icosapent Ethyl 1 g CAPS Take 2 capsules (2 g total) by mouth 2 (two) times daily. 120 capsule 11  . Melatonin 10 MG CAPS Take by mouth at bedtime as needed.    . metFORMIN (GLUCOPHAGE) 1000 MG tablet Take 1 tablet (1,000 mg total) by mouth 2 (two) times daily with a meal. 180 tablet 3  . metoprolol succinate (TOPROL-XL) 50 MG 24 hr tablet TAKE 1 TABLET BY MOUTH TWICE A DAY 180 tablet 3  . Multiple Vitamins-Minerals (VITAMIN D3 COMPLETE PO) Take 1 tablet by mouth daily.    . potassium chloride SA (KLOR-CON M20) 20 MEQ tablet Take 1 tablet (20 mEq total) by mouth daily. 90 tablet 3  . prasugrel (EFFIENT) 10 MG TABS tablet Take 10 mg by mouth daily.     No current facility-administered medications for this visit.     Pertinent items are noted in HPI.   Review of Systems:     Cardiac Review of Systems: [Y] = yes  or   [ N ] = no   Chest Pain [ y  ]  Resting SOB [  y ] Exertional SOB  Blue.Reese  ]  Orthopnea Blue.Reese  ]   Pedal Edema [ n  ]    Palpitations [ n ] Syncope  [ n ]   Presyncope [ n  ]   General Review of Systems: [Y] = yes [  ]=no Constitional: recent weight change [  ];  Wt loss over the last 3 months [   ] anorexia [  ]; fatigue [  ]; nausea [  ]; night sweats [  ]; fever [  ];  or chills [  ];           Eye : blurred vision [  ]; diplopia [   ]; vision changes [  ];  Amaurosis fugax[  ]; Resp: cough [  ];  wheezing[  ];  hemoptysis[  ]; shortness of breath[  ]; paroxysmal nocturnal dyspnea[  ]; dyspnea on exertion[  ]; or orthopnea[  ];  GI:  gallstones[  ], vomiting[  ];   dysphagia[  ]; melena[  ];  hematochezia [  ]; heartburn[  ];   Hx of  Colonoscopy[  ]; GU: kidney stones [  ]; hematuria[  ];   dysuria [  ];  nocturia[  ];  history of     obstruction [  ]; urinary frequency [  ]             Skin: rash, swelling[  ];, hair loss[  ];  peripheral edema[  ];  or itching[  ]; Musculosketetal: myalgias[  ];  joint swelling[  ];  joint erythema[  ];  joint pain[ y ];  back pain[  ];  Heme/Lymph: bruising[  ];  bleeding[  ];  anemia[  ];  Neuro: TIA[  ];  headaches[  ];  stroke[  ];  vertigo[  ];  seizures[  ];   paresthesias[  ];  difficulty walking[  ];  Psych:depression[  ]; anxiety[  ];  Endocrine: diabetes[ y ];  thyroid dysfunction[  ];  Immunizations: Flu up to date [ y]; Pneumococcal up to date Blue.Reese  ];  Other:     PHYSICAL EXAMINATION: BP 126/72   Pulse 64   Resp 20   Ht 5\' 2"  (1.575 m)   Wt 237 lb (107.5 kg)   LMP 10/10/2010   SpO2 97% Comment: RA  BMI 43.35 kg/m  General appearance: alert, cooperative and no distress Head: Normocephalic, without obvious abnormality, atraumatic Neck: no adenopathy, no carotid bruit, no JVD, supple, symmetrical, trachea midline and thyroid not enlarged, symmetric, no tenderness/mass/nodules Lymph nodes: Cervical, supraclavicular, and axillary nodes normal. Resp: clear to auscultation bilaterally Back: symmetric, no curvature. ROM normal. No CVA tenderness. Cardio: regular rate and rhythm, S1, S2 normal, no murmur, click, rub or gallop GI: soft, non-tender; bowel sounds normal; no masses,  no organomegaly Extremities: extremities normal, atraumatic, no cyanosis or edema, Homans sign is negative, no sign of DVT and incisions around both knees, right anterior from knee replacement , left from lipoma removes as teen. Neurologic: Grossly normal  Diagnostic Studies & Laboratory data:     Recent Radiology Findings:   No results found.   I have independently reviewed the above radiology studies  and reviewed the  findings with the patient.   Recent Lab Findings: Lab Results  Component Value Date   WBC 6.9 12/18/2013   HGB 13.6 12/18/2013   HCT 39.5 12/18/2013   PLT 223 12/18/2013   GLUCOSE 136 (H) 01/24/2018   CHOL 220 (H) 05/11/2011   TRIG 576 (H) 05/11/2011   HDL 40 05/11/2011   LDLDIRECT 123 (H) 01/24/2018   [place]  05/11/2011     Comment:       Not calculated due to Triglyceride >400. Suggest ordering Direct LDL (Unit Code: 7820390945).   Total Cholesterol/HDL Ratio:CHD Risk                        Coronary Heart Disease Risk Table  Men       Women          1/2 Average Risk              3.4        3.3              Average Risk              5.0        4.4           2X Average Risk              9.6        7.1           3X Average Risk             23.4       11.0 Use the calculated Patient Ratio above and the CHD Risk table  to determine the patient's CHD Risk. ATP III Classification (LDL):       < 100        mg/dL         Optimal      100 - 129     mg/dL         Near or Above Optimal      130 - 159     mg/dL         Borderline High      160 - 189     mg/dL         High       > 190        mg/dL         Very High     ALT 13 01/24/2018   AST 16 01/24/2018   NA 142 01/24/2018   K 4.7 01/24/2018   CL 102 01/24/2018   CREATININE 0.78 01/24/2018   BUN 11 01/24/2018   CO2 26 01/24/2018   TSH 3.550 02/28/2018   INR 0.95 10/05/2012   HGBA1C 6.9 01/12/2018   CATH: ORDERING PROVIDER   GO, BRIAN MINGTAO EXAM DESCRIPTION   CARDIAC CATHETERIZATION EXAM DATE AND TIME  07/16/2018 08:03 P    PROCEDURE: Emergent cardiac catheterization.  PRIMARY CARE PHYSICIAN: [name], MD.  BRIEF SUMMARY: The patient is a 62 year old female with a history of noninsulin-dependent diabetes, morbid obesity with past evaluation for bariatric surgery, dyslipidemia, vitamin D deficiency, anxiety, who is visiting her daughter and grandchildren in the area and  lives in [place]. She presented to [facility] ED with inferior STEMI and elevated blood pressure. She was accepted in the late evening emergently to transfer to the cardiac catheterization lab.  DIAGNOSTIC PROCEDURE: The patient was accepted on emergent transfer from [facility] to the cardiac catheterization lab. She received sedation. Right wrist area was prepped in usual sterile fashion. One percent local lidocaine was applied. Right radial artery was cannulated and a 6-French sheath was placed using a modified Seldinger technique. Intra-arterial verapamil 3 milligrams were given. 10,000 units of intravenous heparin  was administered. All catheters were advanced over a J-wire. A 6-French JR4 guide was utilized to cannulate the right coronary artery. After the interventional procedure, a 6-French TIG catheter utilized to cannulate  the left main and angiography was performed. A 6-French pigtail catheter was utilized to cross the aortic valve and enter the left ventricle.  Pressures and angiography in RAO view was obtained.  FINDINGS: 1.  Left main: Patent. 2.  LAD: 80% proximal lesion prior to  the takeoff of prominent and   tortuous diagonal branches. The remainder of the LAD is tortuous with   minor diffuse disease and extends to the apex, but tapers rapidly. 3.  Left circumflex: Very small vessel that gives rise only to a very   small distal OM. 4.  RCA: Large dominant tortuous vessel with a mid occlusion. The  distal   vessel has an extensive distribution. The primary branch is   posterolateral branch. 5.  Left ventricle with inferobasal wall hypokinesis with overall normal   LV systolic function. LVEDP is 25-30 mmHg. No significant gradient   across the aortic valve.  INTERVENTIONAL PROCEDURE: Intravenous heparin was given to therapeutic dosing. A 6-French JR4 guide was utilized to cannulate the right coronary artery. A  run-through guidewire was advanced to distal vessel.  Dilatation and restoration of flow was performed with an Emerge 3.0 x 12 mm balloon. Intra-arterial nitroglycerin was administered. Multiple views were imaged in order to better define the distal bifurcation. IVUS evaluation with a Volcano device was performed. Run-through guidewire was advanced to the PDA branch. A Synergy 4.0 x 38 mm stent was deployed in the distal vessel extending into the proximal posterolateral branch at 14-16 atmospheres.  An overlapping Synergy 4.5 x 24 mm stent was placed proximally. A  run-through guidewire was then utilized to cross the stent back into the PDA branch. Dilatation across the stent into the PDA branch was performed with an Emerge 3.0 x 12 mm balloon. Postdilatation of the proximal to mid stented area was performed with a noncompliant 5.0 x 20 mm balloon at 14-16 atmospheres. Subsequent angiography revealed 0% residual stenosis and TIMI-3 flow. The patient tolerated the procedure well without any immediate complications. Moderate or conscious sedation was administered by an independent trained observer under my supervision with time of 55 minutes. Fluoroscopy time of 11 minutes. DAP equals 83.744 Gy-cm  squared. Air Kerma 1213.510 mGy. Less than 250 milliliters of Visipaque contrast utilized.  CONCLUSION: Successful stenting of an occluded large dominant RCA in the setting of an inferior STEMI. The patient with a chronic proximal LAD stenosis, which will require revascularization, likely in [place]. Options remain for possible LIMA to LAD versus percutaneous intervention, but this can be accomplished as an outpatient.   Earl Lites, MD  ttt/Job: [id number]/GYK: [id number]/BL: 07/17/2018 11:38 A /DT: 07/17/2018 06:29 P  DAB 2018-07-18 06:50:16.058  Transthoracic Echocardiogram  CPT Code(s): * Echocardiogram Comp with Color Flow Doppler and Spectral  Doppler (93306)  Indication: Myocardial Infarction BP:      137/64 HR:      72  Summary:  1. Left ventricle is normal in size and systolic function with mild concentric hypertrophy. No overt wall motion abnormality identified. 2. Mild mitral annular calcification.   Findings     Technical Comments: The study quality is technically difficult, however, Definity used to enhance left ventricular visualization and quality. The study is technically limited due to poor acoustic windows.  The study is technically limited due to patient body habitus.  The study is technically limited due to Respiratory Interference.   Left Ventricle: The left ventricular chamber size is normal. Left ventricle septal thickness is mildly increased. There is normal left ventricular systolic function. The EF is estimated at 60-65%. Abnormal left ventricular diastolic filling is observed, consistent with impaired LV relaxation(Stage I).   Left Atrium: The left atrial volume index of 24.8 mL/m2 is normal.   Right Ventricle: The right ventricular cavity size is normal. The right ventricular  global systolic function is normal.   Right Atrium: The right atrium is normal.   Aortic Valve: The aortic valve is not well visualized. There is no evidence of aortic regurgitation. There is no evidence of aortic stenosis.   Mitral Valve: Evidence of mild mitral annular calcification. There is a trivial amount of mitral regurgitation. There is no evidence of mitral stenosis.   Tricuspid Valve: The tricuspid valve leaflets are morphologically normal. There is trivial tricuspid regurgitation. There is no tricuspid stenosis. The right ventricular systolic pressure is calculated at 97mmHg.   Pulmonic Valve: The pulmonic valve is not well visualized. There is no evidence of pulmonic regurgitation. There is no pulmonic stenosis present.   Pericardium: The pericardium  appears normal.   Aorta: The aortic root appears normal.   Venous: The inferior vena cava is dilated.    Contrast: Definity was used to optimize study.    Miscellaneous: No evidence of thrombus, intra-cardiac shunting or vegetation.  Measurements  Chambers Name             Value     Normal Range    IVSd (2D)          1.07 cm    (0.6 - 1.1)    LVIDd (2D)          3.8 cm    -          LVIDs (2D)          2.48 cm    (2.2 - 4)     LVPWd (2D)          1.15 cm    (0.6 - 1.1)    Ao root diameter (2D)    2.5 cm    -          Ascending Ao         2.7 cm    -          LA dimension 2D       3.3 cm    -          LA ESV SP 4CH (MOD)     45.2 ml    -          LA ESV SP 2CH (MOD)     55 ml     -          LA ESV BP (MOD)       51.8 ml    -          LA ESV BP (MOD) index    24.8 ml/m2  (16 - 28)     BP EF (MOD)         69.9 %    (55 - 80)     LV FS (Teichholz) (2D)    34.7 %    (20 - 80)      Mitral Valve Name             Value     Normal Range    MV E-wave Vmax        0.99 m/sec  -          MV A-wave Vmax        1.23 m/sec  -          MV deceleration time     187 msec   (160 - 240)    MV E:A ratio         0.8 ratio   (1.1 - 1.5)  LV E:e' lateral ratio    13.2 ratio  -          LV E:e' septal ratio     16 ratio   -           Tricuspid/Pulmonic Valves Name             Value     Normal Range    TR Vmax           1.56 m/sec  -          RAP             15 mmHg    (<5)        RVSP             25 mmHg    -           Volumes Name             Value     Normal  Range    EF Teichholz (2D)      64.7 %    -          LV EDV SP 4CH (MOD)     91.5 ml    -          LV ESV SP 4CH (MOD)     29.5 ml    -          EF SP 4CH (MOD)       67.8 %    -          LV EDV SP 2CH (MOD)     108 ml    -          LV ESV SP 2CH (MOD)     32.9 ml    -          EF SP 2CH (MOD)       69.5 %    -          LV EDV BP          105 ml    -          LV ESV BP          31.6 ml    -           Electronically signed by: Richardson Landry, MD on 07/17/2018 11:33:32  Other Result Information  Interface, Cvpac Results In - 07/17/2018 11:34 AM EST Patient:      Pamela Thomas, Pamela Thomas    Med Rec#:     [id number]               DOB; Age:     [birth year] 62y yr.   Proc Date:    07/17/2018             Account#:     [id number]             Height:       158 cm / 62.2 in Accession#:   [id number]          Weight:       111 kg / 244.6 lbs Sex:          F                     BSA:          2.09 Pt. Type:     Inpatient Exam Loc:     Portable  Reading:  Richardson Landry, MD Referring:    Richardson Landry, MD Sonographer:  Christen Butter ______________________________________________________________________  Transthoracic Echocardiogram  CPT Code(s): * Echocardiogram Comp with Color Flow Doppler and Spectral Doppler (93306)  Indication: Myocardial Infarction BP:           137/64 HR:           72  Summary:  1. Left ventricle is normal in size and systolic function with mild concentric hypertrophy.  No overt wall motion abnormality identified. 2. Mild mitral annular calcification.   Findings       Technical Comments: The study quality is technically difficult, however, Definity used to enhance left ventricular visualization and quality.  The study is technically limited due to poor acoustic windows.   The study is technically limited  due to patient body habitus.   The study is technically limited due to Respiratory Interference.    Left Ventricle: The left ventricular chamber size is normal.  Left ventricle septal thickness is mildly increased.  There is normal left ventricular systolic function.  The EF is estimated at 60-65%.  Abnormal left ventricular diastolic filling is observed, consistent with impaired LV relaxation(Stage I).    Left Atrium: The left atrial volume index of 24.8 mL/m2 is normal.    Right Ventricle: The right ventricular cavity size is normal.  The right ventricular global systolic function is normal.    Right Atrium: The right atrium is normal.    Aortic Valve: The aortic valve is not well visualized.  There is no evidence of aortic regurgitation.  There is no evidence of aortic stenosis.    Mitral Valve: Evidence of mild mitral annular calcification.  There is a trivial amount of mitral regurgitation.  There is no evidence of mitral stenosis.    Tricuspid Valve: The tricuspid valve leaflets are morphologically normal.  There is trivial tricuspid regurgitation.  There is no tricuspid stenosis.  The right ventricular systolic pressure is calculated at 80mmHg.    Pulmonic Valve: The pulmonic valve is not well visualized.  There is no evidence of pulmonic regurgitation.  There is no pulmonic stenosis present.    Pericardium: The pericardium appears normal.    Aorta: The aortic root appears normal.    Venous: The inferior vena cava is dilated.     Contrast: Definity was used to optimize study.     Miscellaneous: No evidence of thrombus, intra-cardiac shunting or vegetation.   Measurements  Chambers Name                         Value         Normal Range      IVSd (2D)                    1.07 cm       (0.6 - 1.1)       LVIDd (2D)                   3.8 cm        -                  LVIDs (2D)                   2.48 cm       (2.2 - 4)         LVPWd (2D)  1.15 cm       (0.6 - 1.1)       Ao root diameter (2D)        2.5 cm        -                  Ascending Ao                 2.7 cm        -                  LA dimension 2D              3.3 cm        -                  LA ESV SP 4CH (MOD)          45.2 ml       -                  LA ESV SP 2CH (MOD)          55 ml         -                  LA ESV BP (MOD)              51.8 ml       -                  LA ESV BP (MOD) index        24.8 ml/m2    (16 - 28)         BP EF (MOD)                  69.9 %        (55 - 80)         LV FS (Teichholz) (2D)       34.7 %        (20 - 80)          Mitral Valve Name                         Value         Normal Range      MV E-wave Vmax               0.99 m/sec    -                  MV A-wave Vmax               1.23 m/sec    -                  MV deceleration time         187 msec      (160 - 240)       MV E:A ratio                 0.8 ratio     (1.1 - 1.5)       LV E:e' lateral ratio        13.2 ratio    -                  LV E:e' septal ratio         16 ratio      -  Tricuspid/Pulmonic Valves Name                         Value         Normal Range      TR Vmax                      1.56 m/sec    -                  RAP                          15 mmHg       (<5)              RVSP                         25 mmHg       -                   Volumes Name                         Value         Normal Range      EF Teichholz (2D)            64.7 %        -                  LV EDV SP 4CH (MOD)          91.5 ml       -                  LV ESV SP 4CH (MOD)          29.5 ml       -                  EF SP 4CH (MOD)              67.8 %        -                  LV EDV SP 2CH (MOD)          108 ml        -                  LV ESV SP 2CH (MOD)          32.9 ml       -                  EF SP 2CH (MOD)              69.5 %        -                  LV EDV BP                    105 ml        -                  LV ESV BP                    31.6 ml        -  Electronically signed by: Richardson Landry, MD on 07/17/2018     Assessment / Plan:   1/ Acute inferior myocardial infarction, treated with stenting but with residual disease involving the LAD diagonal.  Currently approximately 1 month post angioplasty on Effient.  Her cath films were reviewed with Dr. Burt Knack.  The complexity of her LAD diagonal disease distal left main would make stenting difficult and carry increased risk.  I discussed with her proceeding with coronary artery bypass grafting.  Her films were reviewed and options discussed with her and her husband.  She is willing to proceed, and discussion with cardiology we have made arrangement for her to be transitioned off Effient to a shorter acting antiplatelet drug prior to coronary artery bypass grafting.  We tentatively plan this for December 6, with admission for transitioning earlier that week.   2/dyslipidemia 3/diabetes with complications of coronary artery disease  We will plan follow-up echocardiogram at the time of her admission to evaluate LV function following her recent myocardial infarction.   I  spent 60 minutes with  the patient face to face and greater then 50% of the time was spent in counseling and coordination of care.    [name] MD      [address] [place] [address] Office [phone]   Beeper [phone]  08/07/2018 3:11 PM

## 2018-08-07 NOTE — Telephone Encounter (Signed)
We'll address this later - Vascepa will have FDA approval by January.  Dr. Lemmie Evens

## 2018-08-08 NOTE — Telephone Encounter (Signed)
Patient notified of status of vascepa PA. She voiced understanding. Explained that when new indication for TG 135-499mg /dL is approved per insurance plans, a new PA can be submitted. Advised to work on diet/exercise and continue other medications for now. She voiced understanding.

## 2018-08-13 ENCOUNTER — Other Ambulatory Visit (HOSPITAL_COMMUNITY): Payer: BLUE CROSS/BLUE SHIELD

## 2018-08-13 ENCOUNTER — Inpatient Hospital Stay (HOSPITAL_COMMUNITY): Payer: BLUE CROSS/BLUE SHIELD

## 2018-08-13 ENCOUNTER — Inpatient Hospital Stay (HOSPITAL_COMMUNITY)
Admission: RE | Admit: 2018-08-13 | Discharge: 2018-08-26 | DRG: 235 | Disposition: A | Discharge status: Home / Self Care | Payer: BLUE CROSS/BLUE SHIELD | Attending: Cardiothoracic Surgery | Admitting: Cardiothoracic Surgery

## 2018-08-13 ENCOUNTER — Encounter (HOSPITAL_COMMUNITY): Payer: Self-pay | Admitting: *Deleted

## 2018-08-13 ENCOUNTER — Encounter (HOSPITAL_COMMUNITY): Payer: BLUE CROSS/BLUE SHIELD

## 2018-08-13 ENCOUNTER — Other Ambulatory Visit: Payer: Self-pay | Admitting: *Deleted

## 2018-08-13 ENCOUNTER — Other Ambulatory Visit: Payer: Self-pay

## 2018-08-13 DIAGNOSIS — I080 Rheumatic disorders of both mitral and aortic valves: Secondary | ICD-10-CM | POA: Diagnosis not present

## 2018-08-13 DIAGNOSIS — Z96651 Presence of right artificial knee joint: Secondary | ICD-10-CM | POA: Diagnosis present

## 2018-08-13 DIAGNOSIS — I2119 ST elevation (STEMI) myocardial infarction involving other coronary artery of inferior wall: Secondary | ICD-10-CM | POA: Diagnosis not present

## 2018-08-13 DIAGNOSIS — E8770 Fluid overload, unspecified: Secondary | ICD-10-CM | POA: Diagnosis not present

## 2018-08-13 DIAGNOSIS — Z09 Encounter for follow-up examination after completed treatment for conditions other than malignant neoplasm: Secondary | ICD-10-CM

## 2018-08-13 DIAGNOSIS — I213 ST elevation (STEMI) myocardial infarction of unspecified site: Secondary | ICD-10-CM | POA: Diagnosis not present

## 2018-08-13 DIAGNOSIS — Z7984 Long term (current) use of oral hypoglycemic drugs: Secondary | ICD-10-CM

## 2018-08-13 DIAGNOSIS — Z951 Presence of aortocoronary bypass graft: Secondary | ICD-10-CM

## 2018-08-13 DIAGNOSIS — Z6841 Body Mass Index (BMI) 40.0 and over, adult: Secondary | ICD-10-CM

## 2018-08-13 DIAGNOSIS — J90 Pleural effusion, not elsewhere classified: Secondary | ICD-10-CM | POA: Diagnosis not present

## 2018-08-13 DIAGNOSIS — Z825 Family history of asthma and other chronic lower respiratory diseases: Secondary | ICD-10-CM | POA: Diagnosis not present

## 2018-08-13 DIAGNOSIS — R079 Chest pain, unspecified: Secondary | ICD-10-CM | POA: Diagnosis not present

## 2018-08-13 DIAGNOSIS — Z87891 Personal history of nicotine dependence: Secondary | ICD-10-CM

## 2018-08-13 DIAGNOSIS — Z955 Presence of coronary angioplasty implant and graft: Secondary | ICD-10-CM | POA: Diagnosis not present

## 2018-08-13 DIAGNOSIS — E8881 Metabolic syndrome: Secondary | ICD-10-CM | POA: Diagnosis present

## 2018-08-13 DIAGNOSIS — Z79899 Other long term (current) drug therapy: Secondary | ICD-10-CM

## 2018-08-13 DIAGNOSIS — I2510 Atherosclerotic heart disease of native coronary artery without angina pectoris: Secondary | ICD-10-CM | POA: Diagnosis not present

## 2018-08-13 DIAGNOSIS — E119 Type 2 diabetes mellitus without complications: Secondary | ICD-10-CM | POA: Diagnosis not present

## 2018-08-13 DIAGNOSIS — Z833 Family history of diabetes mellitus: Secondary | ICD-10-CM | POA: Diagnosis not present

## 2018-08-13 DIAGNOSIS — I10 Essential (primary) hypertension: Secondary | ICD-10-CM | POA: Diagnosis not present

## 2018-08-13 DIAGNOSIS — Z7982 Long term (current) use of aspirin: Secondary | ICD-10-CM | POA: Diagnosis not present

## 2018-08-13 DIAGNOSIS — J9811 Atelectasis: Secondary | ICD-10-CM | POA: Diagnosis not present

## 2018-08-13 DIAGNOSIS — R918 Other nonspecific abnormal finding of lung field: Secondary | ICD-10-CM | POA: Diagnosis not present

## 2018-08-13 DIAGNOSIS — I340 Nonrheumatic mitral (valve) insufficiency: Secondary | ICD-10-CM | POA: Diagnosis not present

## 2018-08-13 DIAGNOSIS — E785 Hyperlipidemia, unspecified: Secondary | ICD-10-CM

## 2018-08-13 DIAGNOSIS — I509 Heart failure, unspecified: Secondary | ICD-10-CM | POA: Diagnosis not present

## 2018-08-13 DIAGNOSIS — Z9689 Presence of other specified functional implants: Secondary | ICD-10-CM

## 2018-08-13 DIAGNOSIS — Z4682 Encounter for fitting and adjustment of non-vascular catheter: Secondary | ICD-10-CM | POA: Diagnosis not present

## 2018-08-13 DIAGNOSIS — D62 Acute posthemorrhagic anemia: Secondary | ICD-10-CM | POA: Diagnosis not present

## 2018-08-13 DIAGNOSIS — J9382 Other air leak: Secondary | ICD-10-CM | POA: Diagnosis not present

## 2018-08-13 DIAGNOSIS — Z01818 Encounter for other preprocedural examination: Secondary | ICD-10-CM | POA: Diagnosis not present

## 2018-08-13 LAB — PULMONARY FUNCTION TEST
DL/VA % pred: 132 %
DL/VA: 6.01 ml/min/mmHg/L
DLCO cor % pred: 86 %
DLCO cor: 18.67 ml/min/mmHg
DLCO unc % pred: 83 %
DLCO unc: 18.01 ml/min/mmHg
FEF 25-75 Post: 1.04 L/sec
FEF 25-75 Pre: 1.16 L/sec
FEF2575-%Change-Post: -10 %
FEF2575-%Pred-Post: 49 %
FEF2575-%Pred-Pre: 55 %
FEV1-%Change-Post: -2 %
FEV1-%Pred-Post: 57 %
FEV1-%Pred-Pre: 58 %
FEV1-Post: 1.3 L
FEV1-Pre: 1.34 L
FEV1FVC-%Change-Post: -1 %
FEV1FVC-%Pred-Pre: 102 %
FEV6-%Change-Post: 0 %
FEV6-%Pred-Post: 58 %
FEV6-%Pred-Pre: 58 %
FEV6-Post: 1.68 L
FEV6-Pre: 1.69 L
FEV6FVC-%Pred-Post: 104 %
FEV6FVC-%Pred-Pre: 104 %
FVC-%Change-Post: 0 %
FVC-%Pred-Post: 56 %
FVC-%Pred-Pre: 56 %
FVC-Post: 1.68 L
FVC-Pre: 1.69 L
Post FEV1/FVC ratio: 78 %
Post FEV6/FVC ratio: 100 %
Pre FEV1/FVC ratio: 79 %
Pre FEV6/FVC Ratio: 100 %

## 2018-08-13 LAB — COMPREHENSIVE METABOLIC PANEL
ALT: 21 U/L (ref 0–44)
AST: 21 U/L (ref 15–41)
Albumin: 3.9 g/dL (ref 3.5–5.0)
Alkaline Phosphatase: 63 U/L (ref 38–126)
Anion gap: 9 (ref 5–15)
BUN: 12 mg/dL (ref 8–23)
CO2: 30 mmol/L (ref 22–32)
Calcium: 9.5 mg/dL (ref 8.9–10.3)
Chloride: 100 mmol/L (ref 98–111)
Creatinine, Ser: 0.9 mg/dL (ref 0.44–1.00)
GFR calc Af Amer: >60 mL/min (ref >60)
GFR calc non Af Amer: >60 mL/min (ref >60)
Glucose, Bld: 116 mg/dL — ABNORMAL HIGH (ref 70–99)
POTASSIUM: 3.9 mmol/L (ref 3.5–5.1)
Sodium: 139 mmol/L (ref 135–145)
Total Bilirubin: 0.6 mg/dL (ref 0.3–1.2)
Total Protein: 7.3 g/dL (ref 6.5–8.1)

## 2018-08-13 LAB — CBC WITH DIFFERENTIAL/PLATELET
Abs Immature Granulocytes: 0.02 10*3/uL (ref 0.00–0.07)
Basophils Absolute: 0 10*3/uL (ref 0.0–0.1)
Basophils Relative: 0 %
Eosinophils Absolute: 0.3 10*3/uL (ref 0.0–0.5)
Eosinophils Relative: 3 %
HCT: 41.1 % (ref 36.0–46.0)
Hemoglobin: 12.3 g/dL (ref 12.0–15.0)
Immature Granulocytes: 0 %
Lymphocytes Relative: 38 %
Lymphs Abs: 3.5 10*3/uL (ref 0.7–4.0)
MCH: 27 pg (ref 26.0–34.0)
MCHC: 29.9 g/dL — ABNORMAL LOW (ref 30.0–36.0)
MCV: 90.1 fL (ref 80.0–100.0)
Monocytes Absolute: 0.5 10*3/uL (ref 0.1–1.0)
Monocytes Relative: 6 %
Neutro Abs: 4.7 10*3/uL (ref 1.7–7.7)
Neutrophils Relative %: 53 %
Platelets: 269 10*3/uL (ref 150–400)
RBC: 4.56 MIL/uL (ref 3.87–5.11)
RDW: 14.9 % (ref 11.5–15.5)
WBC: 9.1 10*3/uL (ref 4.0–10.5)
nRBC: 0 % (ref 0.0–0.2)

## 2018-08-13 LAB — MAGNESIUM: Magnesium: 1.5 mg/dL — ABNORMAL LOW (ref 1.7–2.4)

## 2018-08-13 LAB — TSH: TSH: 2.136 u[IU]/mL (ref 0.350–4.500)

## 2018-08-13 LAB — SURGICAL PCR SCREEN
MRSA, PCR: NEGATIVE
STAPHYLOCOCCUS AUREUS: NEGATIVE

## 2018-08-13 LAB — PROTIME-INR
INR: 0.97
Prothrombin Time: 12.8 seconds (ref 11.4–15.2)

## 2018-08-13 LAB — GLUCOSE, CAPILLARY
Glucose-Capillary: 167 mg/dL — ABNORMAL HIGH (ref 70–99)
Glucose-Capillary: 179 mg/dL — ABNORMAL HIGH (ref 70–99)

## 2018-08-13 LAB — HEMOGLOBIN A1C
Hgb A1c MFr Bld: 6.2 % — ABNORMAL HIGH (ref 4.8–5.6)
Mean Plasma Glucose: 131.24 mg/dL

## 2018-08-13 MED ORDER — IRBESARTAN 150 MG PO TABS
300.0000 mg | ORAL_TABLET | Freq: Every day | ORAL | Status: DC
  Administered 2018-08-14: 300 mg via ORAL
  Filled 2018-08-13: qty 2

## 2018-08-13 MED ORDER — MAGNESIUM SULFATE 2 GM/50ML IV SOLN
2.0000 g | Freq: Once | INTRAVENOUS | Status: AC
  Administered 2018-08-13: 2 g via INTRAVENOUS
  Filled 2018-08-13: qty 50

## 2018-08-13 MED ORDER — TIROFIBAN HCL IN NACL 5-0.9 MG/100ML-% IV SOLN
0.1000 ug/kg/min | INTRAVENOUS | Status: DC
  Administered 2018-08-13 – 2018-08-16 (×6): 0.1 ug/kg/min via INTRAVENOUS
  Filled 2018-08-13 (×2): qty 100
  Filled 2018-08-13 (×2): qty 250
  Filled 2018-08-13 (×3): qty 100
  Filled 2018-08-13: qty 250
  Filled 2018-08-13: qty 100
  Filled 2018-08-13: qty 250

## 2018-08-13 MED ORDER — ADULT MULTIVITAMIN W/MINERALS CH
1.0000 | ORAL_TABLET | Freq: Every day | ORAL | Status: DC
  Administered 2018-08-15: 1 via ORAL
  Filled 2018-08-13 (×3): qty 1

## 2018-08-13 MED ORDER — INSULIN ASPART 100 UNIT/ML ~~LOC~~ SOLN
0.0000 [IU] | Freq: Every day | SUBCUTANEOUS | Status: DC
  Administered 2018-08-14 – 2018-08-15 (×2): 2 [IU] via SUBCUTANEOUS

## 2018-08-13 MED ORDER — POTASSIUM CHLORIDE CRYS ER 20 MEQ PO TBCR
20.0000 meq | EXTENDED_RELEASE_TABLET | Freq: Every day | ORAL | Status: DC
  Administered 2018-08-14 – 2018-08-16 (×3): 20 meq via ORAL
  Filled 2018-08-13 (×3): qty 1

## 2018-08-13 MED ORDER — FLUOXETINE HCL 20 MG PO CAPS
20.0000 mg | ORAL_CAPSULE | Freq: Every day | ORAL | Status: DC
  Administered 2018-08-14 – 2018-08-26 (×12): 20 mg via ORAL
  Filled 2018-08-13 (×14): qty 1

## 2018-08-13 MED ORDER — ATORVASTATIN CALCIUM 80 MG PO TABS
80.0000 mg | ORAL_TABLET | Freq: Every day | ORAL | Status: DC
  Administered 2018-08-13 – 2018-08-25 (×11): 80 mg via ORAL
  Filled 2018-08-13 (×11): qty 1

## 2018-08-13 MED ORDER — INSULIN ASPART 100 UNIT/ML ~~LOC~~ SOLN
0.0000 [IU] | Freq: Three times a day (TID) | SUBCUTANEOUS | Status: DC
  Administered 2018-08-13: 2 [IU] via SUBCUTANEOUS
  Administered 2018-08-14: 1 [IU] via SUBCUTANEOUS
  Administered 2018-08-15 (×3): 2 [IU] via SUBCUTANEOUS
  Administered 2018-08-16: 1 [IU] via SUBCUTANEOUS
  Administered 2018-08-16 (×2): 2 [IU] via SUBCUTANEOUS

## 2018-08-13 MED ORDER — ASPIRIN EC 81 MG PO TBEC: 81.0000 mg | DELAYED_RELEASE_TABLET | Freq: Every day | ORAL | Status: DC

## 2018-08-13 MED ORDER — MELATONIN 3 MG PO TABS
3.0000 mg | ORAL_TABLET | Freq: Every evening | ORAL | Status: DC | PRN
  Filled 2018-08-13: qty 1

## 2018-08-13 MED ORDER — SODIUM CHLORIDE 0.9% FLUSH
3.0000 mL | Freq: Two times a day (BID) | INTRAVENOUS | Status: DC
  Administered 2018-08-14: 3 mL via INTRAVENOUS

## 2018-08-13 MED ORDER — FUROSEMIDE 20 MG PO TABS
20.0000 mg | ORAL_TABLET | Freq: Two times a day (BID) | ORAL | Status: DC
  Administered 2018-08-13 – 2018-08-16 (×7): 20 mg via ORAL
  Filled 2018-08-13 (×8): qty 1

## 2018-08-13 MED ORDER — TIROFIBAN (AGGRASTAT) BOLUS VIA INFUSION
25.0000 ug/kg | Freq: Once | INTRAVENOUS | Status: AC
  Administered 2018-08-13: 2697.5 ug via INTRAVENOUS
  Filled 2018-08-13: qty 54

## 2018-08-13 MED ORDER — ALBUTEROL SULFATE (2.5 MG/3ML) 0.083% IN NEBU
2.5000 mg | INHALATION_SOLUTION | Freq: Once | RESPIRATORY_TRACT | Status: AC
  Administered 2018-08-13: 2.5 mg via RESPIRATORY_TRACT

## 2018-08-13 MED ORDER — METOPROLOL SUCCINATE ER 50 MG PO TB24
50.0000 mg | ORAL_TABLET | Freq: Two times a day (BID) | ORAL | Status: DC
  Administered 2018-08-13 – 2018-08-14 (×3): 50 mg via ORAL
  Filled 2018-08-13 (×3): qty 1

## 2018-08-13 MED ORDER — ICOSAPENT ETHYL 1 G PO CAPS: 2.0000 g | ORAL_CAPSULE | Freq: Two times a day (BID) | ORAL | Status: DC

## 2018-08-13 MED ORDER — ASPIRIN EC 81 MG PO TBEC
81.0000 mg | DELAYED_RELEASE_TABLET | Freq: Every day | ORAL | Status: DC
  Administered 2018-08-14 – 2018-08-16 (×3): 81 mg via ORAL
  Filled 2018-08-13 (×3): qty 1

## 2018-08-13 NOTE — Progress Notes (Signed)
[id number] Gave pt [facility] booklet, care guide and in the tube handout. Wrote down how to view pre op video. Discussed importance of IS and walking after surgery. Gave IS and pt demonstrated 1250 ml correctly. Discussed sternal precautions. Pt stated husband available after discharge to assist with care. Set up lunch for pt. Will follow up tomorrow. Pt stated she wants to walk. Encouraged walking with staff also. Graylon Good RN BSN 08/13/2018 1:18 PM

## 2018-08-13 NOTE — Progress Notes (Signed)
ANTICOAGULATION CONSULT NOTE - Initial Consult  Pharmacy Consult for tirofiban Indication: effient washout  No Known Allergies  Patient Measurements: Height: 5\' 2"  (157.5 cm) Weight: 237 lb 12.8 oz (107.9 kg) IBW/kg (Calculated) : 50.1  Vital Signs: Temp: 98.6 F (37 C) (12/02 1121) Temp Source: Oral (12/02 1121) BP: 128/63 (12/02 1121) Pulse Rate: 65 (12/02 1121)  Labs: No results for input(s): HGB, HCT, PLT, APTT, LABPROT, INR, HEPARINUNFRC, HEPRLOWMOCWT, CREATININE, CKTOTAL, CKMB, TROPONINI in the last 72 hours.  CrCl cannot be calculated (Patient's most recent lab result is older than the maximum 21 days allowed.).   Medical History: Past Medical History:  Diagnosis Date  . ALLERGIC RHINITIS, SEASONAL   . Diabetes mellitus without complication ([place])   . DYSLIPIDEMIA   . Dysrhythmia    palpitations-evaluated by Dr Johnsie Cancel 6/12/eccho 6/13  . Glucose intolerance (impaired glucose tolerance)   . History of blood transfusion   . Hypertension   . OBESITY, NOS   . Osteoarthritis    rt knee  . PLANTAR FASCIITIS, BILATERAL   . Vertigo    Assessment: 62 year old female admitted for effient washout, will start tirofiban. Planning cabg 12/6  Goal of Therapy:   Monitor platelets by anticoagulation protocol: Yes   Plan:  Tirofiban bolus then 95mcg/kg/min Stop at least 6 hours prior to surgery  Erin Hearing PharmD., BCPS Clinical Pharmacist 08/13/2018 2:35 PM

## 2018-08-13 NOTE — H&P (Signed)
Cardiology Admission History and Physical:   Patient ID: LAVORIS CANIZALES MRN: [id number]; DOB: 1955-10-03   Admission date: 08/13/2018  Primary Care Provider: Dickie La, MD Primary Cardiologist: Minus Breeding, MD  Primary Electrophysiologist:  None   Chief Complaint:  CAD with need for CABG and Effient washout  Patient Profile:   Pamela Thomas is a 62 y.o. female with recent STEMI of RCA 07/16/18 in [place] at [facility].  She rec'd stent to RCA and also with residual disease in LAD.  Other hx of diabetes, HLD, and palpitations.   On follow up decision was treatment of LAD.  Films reviewed with Dr. Burt Knack and Dr. Servando Snare.     History of Present Illness:   Pamela Thomas with recent STEMI 07/16/18 and emergent stent -DES to RCA and disharged with Effient.  She has residual LAD disease and this has been evaluated with Dr. Percival Spanish, Dr. Burt Knack and Dr. Servando Snare and per Dr. Servando Snare "The complexity of her LAD diagonal disease distal left main would make stenting difficult and carry increased risk.  I discussed with her proceeding with coronary artery bypass grafting."  Her effient was stopped on 08/09/18     Pt is now admitted for crossover with Aggrastat for antiplatelet washout for protection of new stent.  Then plan to proceed with CABG on 08/17/18.  She will need echo with this admit as well prior to procedure.    Today she has no complaints, no chest pain and no SOB.  She has been feeling well. No fevers colds or any complaints.  Occ palpitations which she has had for years.       Past Medical History:  Diagnosis Date  . ALLERGIC RHINITIS, SEASONAL   . Diabetes mellitus without complication ([place])   . DYSLIPIDEMIA   . Dysrhythmia    palpitations-evaluated by Dr Johnsie Cancel 6/12/eccho 6/13  . Glucose intolerance (impaired glucose tolerance)   . History of blood transfusion   . Hypertension   . OBESITY, NOS   . Osteoarthritis    rt knee  . PLANTAR FASCIITIS, BILATERAL   . Vertigo       Past Surgical History:  Procedure Laterality Date  . BREAST BIOPSY  05/27/03   left  . CARPAL TUNNEL RELEASE  05/27/03   Right  . CESAREAN SECTION  05/27/03  . KNEE SURGERY     left- removal fatty tumor  . ROTATOR CUFF REPAIR     Bilateral  . TOTAL KNEE ARTHROPLASTY  10/10/2012   Procedure: TOTAL KNEE ARTHROPLASTY;  Surgeon: Gearlean Alf, MD;  Location: [facility];  Service: Orthopedics;  Laterality: Right;     Medications Prior to Admission: Prior to Admission medications   Medication Sig Start Date End Date Taking? Authorizing Provider  aspirin 81 MG tablet Take 1 tablet (81 mg total) by mouth daily. 08/06/18   Dickie La, MD  atorvastatin (LIPITOR) 80 MG tablet Take 80 mg by mouth daily.    [provider]  candesartan (ATACAND) 32 MG tablet Take 1 tablet (32 mg total) by mouth daily. 01/24/18   Dickie La, MD  FLUoxetine (PROZAC) 20 MG tablet TAKE 1 TABLET BY MOUTH EVERY DAY 02/06/18   Dickie La, MD  furosemide (LASIX) 20 MG tablet Take 1 tablet (20 mg total) by mouth 2 (two) times daily. Patient does not want to split a 40 mg  Tab so please do not substitute 01/24/18   Dickie La, MD  Icosapent Ethyl 1 g  CAPS Take 2 capsules (2 g total) by mouth 2 (two) times daily. 08/03/18   Hilty, Nadean Corwin, MD  Melatonin 10 MG CAPS Take by mouth at bedtime as needed.    [provider]  metFORMIN (GLUCOPHAGE) 1000 MG tablet Take 1 tablet (1,000 mg total) by mouth 2 (two) times daily with a meal. 10/23/17   Dickie La, MD  metoprolol succinate (TOPROL-XL) 50 MG 24 hr tablet TAKE 1 TABLET BY MOUTH TWICE A DAY 07/18/18   Dickie La, MD  Multiple Vitamins-Minerals (VITAMIN D3 COMPLETE PO) Take 1 tablet by mouth daily.    [provider]  potassium chloride SA (KLOR-CON M20) 20 MEQ tablet Take 1 tablet (20 mEq total) by mouth daily. 11/10/17   Dickie La, MD  prasugrel (EFFIENT) 10 MG TABS tablet Take 10 mg by mouth daily. 07/19/18   [provider]      Allergies:   No Known Allergies  Social History:   Social History   Socioeconomic History  . Marital status: Married    Spouse name: Not on file  . Number of children: 2  . Years of education: Not on file  . Highest education level: Not on file  Occupational History  . Occupation: Sales executive  Social Needs  . Financial resource strain: Not on file  . Food insecurity:    Worry: Not on file    Inability: Not on file  . Transportation needs:    Medical: Not on file    Non-medical: Not on file  Tobacco Use  . Smoking status: Former Smoker    Last attempt to quit: 09/12/1990    Years since quitting: 27.9  . Smokeless tobacco: Never Used  Substance and Sexual Activity  . Alcohol use: Yes    Alcohol/week: 0.0 standard drinks    Comment: rare  . Drug use: No  . Sexual activity: Not on file  Lifestyle  . Physical activity:    Days per week: Not on file    Minutes per session: Not on file  . Stress: Not on file  Relationships  . Social connections:    Talks on phone: Not on file    Gets together: Not on file    Attends religious service: Not on file    Active member of club or organization: Not on file    Attends meetings of clubs or organizations: Not on file    Relationship status: Not on file  . Intimate partner violence:    Fear of current or ex partner: Not on file    Emotionally abused: Not on file    Physically abused: Not on file    Forced sexual activity: Not on file  Other Topics Concern  . Not on file  Social History Narrative   Lives with husband in a split level home.  Has 3 children.     Worked as a Insurance account manager.    Highest level of education:  12th grade    Family History:  The patient's family history includes COPD in her brother and mother; Diabetes in her unknown relative; Healthy in her daughter; Lung cancer in her father; Melanoma in her brother.    ROS:  Please see the history of present illness.  All other ROS reviewed and  negative.    General:no colds or fevers, no weight changes Skin:no rashes or ulcers HEENT:no blurred vision, no congestion CV:see HPI PUL:see HPI GI:no diarrhea constipation or melena, no indigestion GU:no hematuria, no dysuria  MS:no joint pain, no claudication Neuro:no syncope, no lightheadedness Endo:+ diabetes, no thyroid disease  Physical Exam/Data:   Vitals:   08/13/18 1121  BP: 128/63  Pulse: 65  Resp: 15  Temp: 98.6 F (37 C)  TempSrc: Oral  SpO2: 97%  Weight: 107.9 kg  Height: 5\' 2"  (1.575 m)   No intake or output data in the 24 hours ending 08/13/18 1149 Filed Weights   08/13/18 1121  Weight: 107.9 kg   Body mass index is 43.49 kg/m.  General:  Well nourished, well developed, in no acute distress HEENT: normal Lymph: no adenopathy Neck: no JVD Endocrine:  No thryomegaly Vascular: No carotid bruits; pedal pulses 2+ bilaterally   Cardiac:  normal S1, S2; RRR; no murmur gallup rub or click Lungs:  clear to auscultation bilaterally, no wheezing, rhonchi or rales  Abd: soft, nontender, no hepatomegaly  Ext: no lower ext edema Musculoskeletal:  No deformities, BUE and BLE strength normal and equal Skin: warm and dry  Neuro:  CNs 2-12 intact, no focal abnormalities noted Psych:  Normal affect    EKG:  The ECG that was done 08/13/18 was personally reviewed and demonstrates SR at 60 with deep T wave inversion in inf leads and poor R wave progression in ant leads.  No change from 11.4.19  Relevant CV Studies: CATH: ORDERING PROVIDER   GO, BRIAN MINGTAO EXAM DESCRIPTION   CARDIAC CATHETERIZATION EXAM DATE AND TIME  07/16/2018 08:03 P    PROCEDURE: Emergent cardiac catheterization.  PRIMARY CARE PHYSICIAN: [name], MD.  BRIEF SUMMARY: The patient is a 62 year old female with a history of noninsulin-dependent diabetes, morbid obesity with past evaluation for bariatric surgery, dyslipidemia, vitamin D deficiency, anxiety, who is visiting her  daughter and grandchildren in the area and lives in [place]. She presented to [facility] ED with inferior STEMI and elevated blood pressure. She was accepted in the late evening emergently to transfer to the cardiac catheterization lab.  DIAGNOSTIC PROCEDURE: The patient was accepted on emergent transfer from [facility] to the cardiac catheterization lab. She received sedation. Right wrist area was prepped in usual sterile fashion. One percent local lidocaine was applied. Right radial artery was cannulated and a 6-French sheath was placed using a modified Seldinger technique. Intra-arterial verapamil 3 milligrams were given. 10,000 units of intravenous heparin  was administered. All catheters were advanced over a J-wire. A 6-French JR4 guide was utilized to cannulate the right coronary artery. After the interventional procedure, a 6-French TIG catheter utilized to cannulate  the left main and angiography was performed. A 6-French pigtail catheter was utilized to cross the aortic valve and enter the left ventricle.  Pressures and angiography in RAO view was obtained.  FINDINGS: 1.  Left main: Patent. 2.  LAD: 80% proximal lesion prior to the takeoff of prominent and   tortuous diagonal branches. The remainder of the LAD is tortuous with   minor diffuse disease and extends to the apex, but tapers rapidly. 3.  Left circumflex: Very small vessel that gives rise only to a very   small distal OM. 4.  RCA: Large dominant tortuous vessel with a mid occlusion. The  distal   vessel has an extensive distribution. The primary branch is   posterolateral branch. 5.  Left ventricle with inferobasal wall hypokinesis with overall normal   LV systolic function. LVEDP is 25-30 mmHg. No significant gradient   across the aortic valve.  INTERVENTIONAL PROCEDURE: Intravenous heparin was given to therapeutic dosing. A 6-French JR4  guide was utilized to  cannulate the right coronary artery. A run-through guidewire was advanced to distal vessel.  Dilatation and restoration of flow was performed with an Emerge 3.0 x 12 mm balloon. Intra-arterial nitroglycerin was administered. Multiple views were imaged in order to better define the distal bifurcation. IVUS evaluation with a Volcano device was performed. Run-through guidewire was advanced to the PDA branch. A Synergy 4.0 x 38 mm stent was deployed in the distal vessel extending into the proximal posterolateral branch at 14-16 atmospheres.  An overlapping Synergy 4.5 x 24 mm stent was placed proximally. A  run-through guidewire was then utilized to cross the stent back into the PDA branch. Dilatation across the stent into the PDA branch was performed with an Emerge 3.0 x 12 mm balloon. Postdilatation of the proximal to mid stented area was performed with a noncompliant 5.0 x 20 mm balloon at 14-16 atmospheres. Subsequent angiography revealed 0% residual stenosis and TIMI-3 flow. The patient tolerated the procedure well without any immediate complications. Moderate or conscious sedation was administered by an independent trained observer under my supervision with time of 55 minutes. Fluoroscopy time of 11 minutes. DAP equals 83.744 Gy-cm  squared. Air Kerma 1213.510 mGy. Less than 250 milliliters of Visipaque contrast utilized.  CONCLUSION: Successful stenting of an occluded large dominant RCA in the setting of an inferior STEMI. The patient with a chronic proximal LAD stenosis, which will require revascularization, likely in [place]. Options remain for possible LIMA to LAD versus percutaneous intervention, but this can be accomplished as an outpatient.   Earl Lites, MD  ttt/Job: [id number]/VVO: [id number]/YI: 07/17/2018 11:38 A /DT: 07/17/2018 06:29 P DAB 2018-07-18 06:50:16.058  Transthoracic Echocardiogram  CPT Code(s): * Echocardiogram Comp with  Color Flow Doppler and Spectral Doppler (93306)  Indication: Myocardial Infarction BP:      137/64 HR:      72  Summary:  1. Left ventricle is normal in size and systolic function with mild concentric hypertrophy. No overt wall motion abnormality identified. 2. Mild mitral annular calcification.   Findings     Technical Comments: The study quality is technically difficult, however, Definity used to enhance left ventricular visualization and quality. The study is technically limited due to poor acoustic windows.  The study is technically limited due to patient body habitus.  The study is technically limited due to Respiratory Interference.   Left Ventricle: The left ventricular chamber size is normal. Left ventricle septal thickness is mildly increased. There is normal left ventricular systolic function. The EF is estimated at 60-65%. Abnormal left ventricular diastolic filling is observed, consistent with impaired LV relaxation(Stage I).   Left Atrium: The left atrial volume index of 24.8 mL/m2 is normal.   Right Ventricle: The right ventricular cavity size is normal. The right ventricular global systolic function is normal.   Right Atrium: The right atrium is normal.   Aortic Valve: The aortic valve is not well visualized. There is no evidence of aortic regurgitation. There is no evidence of aortic stenosis.   Mitral Valve: Evidence of mild mitral annular calcification. There is a trivial amount of mitral regurgitation. There is no evidence of mitral stenosis.   Tricuspid Valve: The tricuspid valve leaflets are morphologically normal. There is trivial tricuspid regurgitation. There is no tricuspid stenosis. The right ventricular systolic pressure is calculated at 30mmHg.   Pulmonic Valve: The pulmonic valve is not well visualized. There is no evidence of pulmonic regurgitation. There is no pulmonic stenosis present.    Pericardium:  The pericardium appears normal.   Aorta: The aortic root appears normal.   Venous: The inferior vena cava is dilated.    Contrast: Definity was used to optimize study.    Miscellaneous: No evidence of thrombus, intra-cardiac shunting or vegetation.  Measurements  Chambers Name             Value     Normal Range    IVSd (2D)          1.07 cm    (0.6 - 1.1)    LVIDd (2D)          3.8 cm    -          LVIDs (2D)          2.48 cm    (2.2 - 4)     LVPWd (2D)          1.15 cm    (0.6 - 1.1)    Ao root diameter (2D)    2.5 cm    -          Ascending Ao         2.7 cm    -          LA dimension 2D       3.3 cm    -          LA ESV SP 4CH (MOD)     45.2 ml    -          LA ESV SP 2CH (MOD)     55 ml     -          LA ESV BP (MOD)       51.8 ml    -          LA ESV BP (MOD) index    24.8 ml/m2  (16 - 28)     BP EF (MOD)         69.9 %    (55 - 80)     LV FS (Teichholz) (2D)    34.7 %    (20 - 80)      Mitral Valve Name             Value     Normal Range    MV E-wave Vmax        0.99 m/sec  -          MV A-wave Vmax        1.23 m/sec  -          MV deceleration time     187 msec   (160 - 240)    MV E:A ratio         0.8 ratio   (1.1 - 1.5)    LV E:e' lateral ratio    13.2 ratio  -          LV E:e' septal ratio     16 ratio   -           Tricuspid/Pulmonic Valves Name             Value     Normal Range    TR Vmax           1.56 m/sec  -          RAP             15 mmHg    (<5)        RVSP  25 mmHg    -           Volumes Name              Value     Normal Range    EF Teichholz (2D)      64.7 %    -          LV EDV SP 4CH (MOD)     91.5 ml    -          LV ESV SP 4CH (MOD)     29.5 ml    -          EF SP 4CH (MOD)       67.8 %    -          LV EDV SP 2CH (MOD)     108 ml    -          LV ESV SP 2CH (MOD)     32.9 ml    -          EF SP 2CH (MOD)       69.5 %    -          LV EDV BP          105 ml    -          LV ESV BP          31.6 ml    -           Electronically signed by: Richardson Landry, MD on 07/17/2018 11:33:32  Other Result Information  Interface, Cvpac Results In - 07/17/2018 11:34 AM EST Patient:      ELINDA, BUNTEN    Med Rec#:     [id number]               DOB; Age:     04-28-1956 62y yr.   Proc Date:    07/17/2018             Account#:     [id number]             Height:       158 cm / 62.2 in Accession#:   [id number]          Weight:       111 kg / 244.6 lbs Sex:          F                     BSA:          2.09 Pt. Type:     Inpatient Exam Loc:     Portable  Reading:      Richardson Landry, MD Referring:    Richardson Landry, MD Sonographer:  Christen Butter ______________________________________________________________________  Transthoracic Echocardiogram  CPT Code(s): * Echocardiogram Comp with Color Flow Doppler and Spectral Doppler (93306)  Indication: Myocardial Infarction BP:           137/64 HR:           72  Summary:  1. Left ventricle is normal in size and systolic function with mild concentric hypertrophy.  No overt wall motion abnormality identified. 2. Mild mitral annular calcification.   Findings       Technical Comments: The study quality is technically difficult, however, Definity used to enhance left ventricular visualization and quality.  The study is technically limited due to poor acoustic windows.   The  study is technically limited due to patient body habitus.   The study is technically limited due to Respiratory Interference.    Left Ventricle: The left ventricular chamber size is normal.  Left ventricle septal thickness is mildly increased.  There is normal left ventricular systolic function.  The EF is estimated at 60-65%.  Abnormal left ventricular diastolic filling is observed, consistent with impaired LV relaxation(Stage I).    Left Atrium: The left atrial volume index of 24.8 mL/m2 is normal.    Right Ventricle: The right ventricular cavity size is normal.  The right ventricular global systolic function is normal.    Right Atrium: The right atrium is normal.    Aortic Valve: The aortic valve is not well visualized.  There is no evidence of aortic regurgitation.  There is no evidence of aortic stenosis.    Mitral Valve: Evidence of mild mitral annular calcification.  There is a trivial amount of mitral regurgitation.  There is no evidence of mitral stenosis.    Tricuspid Valve: The tricuspid valve leaflets are morphologically normal.  There is trivial tricuspid regurgitation.  There is no tricuspid stenosis.  The right ventricular systolic pressure is calculated at 55mmHg.    Pulmonic Valve: The pulmonic valve is not well visualized.  There is no evidence of pulmonic regurgitation.  There is no pulmonic stenosis present.    Pericardium: The pericardium appears normal.    Aorta: The aortic root appears normal.    Venous: The inferior vena cava is dilated.     Contrast: Definity was used to optimize study.     Miscellaneous: No evidence of thrombus, intra-cardiac shunting or vegetation.   Measurements  Chambers Name                         Value         Normal Range      IVSd (2D)                    1.07 cm       (0.6 - 1.1)       LVIDd (2D)                   3.8 cm        -                  LVIDs (2D)                   2.48 cm       (2.2 - 4)          LVPWd (2D)                   1.15 cm       (0.6 - 1.1)       Ao root diameter (2D)        2.5 cm        -                  Ascending Ao                 2.7 cm        -                  LA dimension 2D              3.3 cm        -  LA ESV SP 4CH (MOD)          45.2 ml       -                  LA ESV SP 2CH (MOD)          55 ml         -                  LA ESV BP (MOD)              51.8 ml       -                  LA ESV BP (MOD) index        24.8 ml/m2    (16 - 28)         BP EF (MOD)                  69.9 %        (55 - 80)         LV FS (Teichholz) (2D)       34.7 %        (20 - 80)          Mitral Valve Name                         Value         Normal Range      MV E-wave Vmax               0.99 m/sec    -                  MV A-wave Vmax               1.23 m/sec    -                  MV deceleration time         187 msec      (160 - 240)       MV E:A ratio                 0.8 ratio     (1.1 - 1.5)       LV E:e' lateral ratio        13.2 ratio    -                  LV E:e' septal ratio         16 ratio      -                   Tricuspid/Pulmonic Valves Name                         Value         Normal Range      TR Vmax                      1.56 m/sec    -                  RAP                          15 mmHg       (<  5)              RVSP                         25 mmHg       -                   Volumes Name                         Value         Normal Range      EF Teichholz (2D)            64.7 %        -                  LV EDV SP 4CH (MOD)          91.5 ml       -                  LV ESV SP 4CH (MOD)          29.5 ml       -                  EF SP 4CH (MOD)              67.8 %        -                  LV EDV SP 2CH (MOD)          108 ml        -                  LV ESV SP 2CH (MOD)          32.9 ml       -                  EF SP 2CH (MOD)              69.5 %        -                  LV EDV BP                    105 ml        -                  LV ESV BP                     31.6 ml       -                   Electronically signed by: Richardson Landry, MD on 07/17/2018      Laboratory Data:  ChemistryNo results for input(s): NA, K, CL, CO2, GLUCOSE, BUN, CREATININE, CALCIUM, GFRNONAA, GFRAA, ANIONGAP in the last 168 hours.  No results for input(s): PROT, ALBUMIN, AST, ALT, ALKPHOS, BILITOT in the last 168 hours. HematologyNo results for input(s): WBC, RBC, HGB, HCT, MCV, MCH, MCHC, RDW, PLT in the last 168 hours. Cardiac EnzymesNo results for input(s): TROPONINI in the last 168 hours. No results for input(s): TROPIPOC in the last 168 hours.  BNPNo results for input(s): BNP, PROBNP in the last 168  hours.  DDimer No results for input(s): DDIMER in the last 168 hours.  Radiology/Studies:  No results found.  Assessment and Plan:   1. CAD with need for CABG on 08/17/18 and recent STEMI 07/16/18 of inf. Wall with emergent stent to RCA.  Now on effient washout for CABG and IV aggrastat.  She is at high risk to occlude new stent and needs stepdown to monitor closely. Continue ASA  For echo today and pre-cabg dopplers.    2.   HLD continue statin at high dose and on Icosapent Ethyl.  3.   DM-2, check hgbA 1C and hold metformin, use SSI.     Severity of Illness: The appropriate patient status for this patient is INPATIENT. Inpatient status is judged to be reasonable and necessary in order to provide the required intensity of service to ensure the patient's safety. The patient's presenting symptoms, physical exam findings, and initial radiographic and laboratory data in the context of their chronic comorbidities is felt to place them at high risk for further clinical deterioration. Furthermore, it is not anticipated that the patient will be medically stable for discharge from the hospital within 2 midnights of admission. The following factors support the patient status of inpatient.   " The patient's presenting symptoms include effient washout with new DES  stent -high risk for occlusion of stent. . " The worrisome physical exam findings include significant LAD disease. " The initial radiographic and laboratory data are worrisome because of currently stable.. " The chronic co-morbidities include CAD, diabetes, new DES stent.   * I certify that at the point of admission it is my clinical judgment that the patient will require inpatient hospital care spanning beyond 2 midnights from the point of admission due to high intensity of service, high risk for further deterioration and high frequency of surveillance required.*    For questions or updates, please contact Joseph Please consult [email or web address] for contact info under        Signed, Cecilie Kicks, NP  08/13/2018 11:49 AM

## 2018-08-13 NOTE — Progress Notes (Signed)
Patient ID: Pamela Thomas, female   DOB: November 15, 1955, 62 y.o.   MRN: [id number]      [address]       [place] [address]             [phone]                   Procedure(s) (LRB): CORONARY ARTERY BYPASS GRAFTING (CABG) (N/A) TRANSESOPHAGEAL ECHOCARDIOGRAM (TEE) (N/A)  LOS: 0 days   Subjective: Patient admitted for  transition off of Effient started for recent placement of stents rca with acute MI. Patient has had not recurrent chest pain. Notes since acute event last month her trouble with sob at night has improved   Objective: Vital signs in last 24 hours: Patient Vitals for the past 24 hrs:  BP Temp Temp src Pulse Resp SpO2 Height Weight  08/13/18 1121 128/63 98.6 F (37 C) Oral 65 15 97 % 5\' 2"  (1.575 m) 107.9 kg    Filed Weights   08/13/18 1121  Weight: 107.9 kg    Hemodynamic parameters for last 24 hours:    Intake/Output from previous day: No intake/output data recorded. Intake/Output this shift: No intake/output data recorded.  Scheduled Meds: . albuterol  2.5 mg Nebulization Once  . [START ON 08/14/2018] aspirin EC  81 mg Oral Daily  . atorvastatin  80 mg Oral q1800  . [START ON 08/14/2018] FLUoxetine  20 mg Oral Daily  . furosemide  20 mg Oral BID  . insulin aspart  0-5 Units Subcutaneous QHS  . insulin aspart  0-9 Units Subcutaneous TID WC  . [START ON 08/14/2018] irbesartan  300 mg Oral Daily  . metoprolol succinate  50 mg Oral BID  . [START ON 08/14/2018] multivitamin with minerals  1 tablet Oral Daily  . [START ON 08/14/2018] potassium chloride SA  20 mEq Oral Daily  . sodium chloride flush  3 mL Intravenous Q12H  . tirofiban  25 mcg/kg Intravenous Once   Continuous Infusions: . tirofiban     PRN Meds:.Melatonin  General appearance: alert and cooperative Neurologic: intact Heart: regular rate and rhythm, S1, S2 normal, no murmur, click, rub or gallop Lungs: clear to auscultation bilaterally Abdomen: soft, non-tender; bowel  sounds normal; no masses,  no organomegaly Extremities: extremities normal, atraumatic, no cyanosis or edema and Homans sign is negative, no sign of DVT  Lab Results: CBC: Recent Labs    08/13/18 1249  WBC 9.1  HGB 12.3  HCT 41.1  PLT 269   BMET:  Recent Labs    08/13/18 1249  NA 139  K 3.9  CL 100  CO2 30  GLUCOSE 116*  BUN 12  CREATININE 0.90  CALCIUM 9.5    PT/INR:  Recent Labs    08/13/18 1249  LABPROT 12.8  INR 0.97     Radiology No results found.   Assessment/Plan: S/P Procedure(s) (LRB): CORONARY ARTERY BYPASS GRAFTING (CABG) (N/A) TRANSESOPHAGEAL ECHOCARDIOGRAM (TEE) (N/A) Mobilize Tentative cabg Friday , 7 days off Effient   [name] MD 08/13/2018 3:36 PM

## 2018-08-14 ENCOUNTER — Inpatient Hospital Stay (HOSPITAL_COMMUNITY): Payer: BLUE CROSS/BLUE SHIELD

## 2018-08-14 ENCOUNTER — Encounter (HOSPITAL_COMMUNITY): Payer: BLUE CROSS/BLUE SHIELD

## 2018-08-14 DIAGNOSIS — I340 Nonrheumatic mitral (valve) insufficiency: Secondary | ICD-10-CM

## 2018-08-14 LAB — GLUCOSE, CAPILLARY
Glucose-Capillary: 140 mg/dL — ABNORMAL HIGH (ref 70–99)
Glucose-Capillary: 88 mg/dL (ref 70–99)
Glucose-Capillary: 84 mg/dL (ref 70–99)
Glucose-Capillary: 211 mg/dL — ABNORMAL HIGH (ref 70–99)

## 2018-08-14 LAB — CBC
HCT: 39 % (ref 36.0–46.0)
Hemoglobin: 11.9 g/dL — ABNORMAL LOW (ref 12.0–15.0)
MCH: 27.8 pg (ref 26.0–34.0)
MCHC: 30.5 g/dL (ref 30.0–36.0)
MCV: 91.1 fL (ref 80.0–100.0)
Platelets: 209 10*3/uL (ref 150–400)
RBC: 4.28 MIL/uL (ref 3.87–5.11)
RDW: 15.1 % (ref 11.5–15.5)
WBC: 8.5 10*3/uL (ref 4.0–10.5)
nRBC: 0 % (ref 0.0–0.2)

## 2018-08-14 LAB — MAGNESIUM: Magnesium: 2 mg/dL (ref 1.7–2.4)

## 2018-08-14 LAB — ECHOCARDIOGRAM COMPLETE
Height: 62 in
Weight: 3804.8 oz

## 2018-08-14 LAB — HIV ANTIBODY (ROUTINE TESTING W REFLEX): HIV Screen 4th Generation wRfx: NONREACTIVE

## 2018-08-14 LAB — BASIC METABOLIC PANEL
Anion gap: 13 (ref 5–15)
BUN: 17 mg/dL (ref 8–23)
CO2: 25 mmol/L (ref 22–32)
Calcium: 9.2 mg/dL (ref 8.9–10.3)
Chloride: 99 mmol/L (ref 98–111)
Creatinine, Ser: 0.88 mg/dL (ref 0.44–1.00)
GFR calc Af Amer: >60 mL/min (ref >60)
GFR calc non Af Amer: >60 mL/min (ref >60)
Glucose, Bld: 117 mg/dL — ABNORMAL HIGH (ref 70–99)
Potassium: 3.8 mmol/L (ref 3.5–5.1)
Sodium: 137 mmol/L (ref 135–145)

## 2018-08-14 NOTE — Progress Notes (Signed)
CARDIAC REHAB PHASE I   PRE:  Rate/Rhythm: 62 SR  BP:  Supine:   Sitting: 130/74  Standing:    SaO2: 95%RA  MODE:  Ambulation: 675 ft   POST:  Rate/Rhythm: 83 SR  BP:  Supine:   Sitting: 174/86  Standing:    SaO2: 98%RA 1010-1034 Pt walked 675 ft on RA pushing IV pole with steady gait and no CP. Tolerated well. To bed after walk for ECHO. Pt stated she felt a little SOB after walk but was feeling this prior to admission with activity.   Graylon Good, RN BSN  08/14/2018 10:30 AM

## 2018-08-14 NOTE — Progress Notes (Signed)
  Echocardiogram 2D Echocardiogram has been performed.  Jannett Celestine 08/14/2018, 12:33 PM

## 2018-08-14 NOTE — Progress Notes (Signed)
DAILY PROGRESS NOTE   Patient Name: Pamela Thomas Date of Encounter: 08/14/2018  Chief Complaint   No events overnight  Patient Profile   Pamela Thomas is a 62 y.o. female with recent STEMI of RCA 07/16/18 in [place] at [facility].  She rec'd stent to RCA and also with residual disease in LAD.  Other hx of diabetes, HLD, and palpitations. On follow up decision was treatment of LAD.  Films reviewed with Dr. Burt Knack and Dr. Servando Snare.   Subjective   No events overnight. Mobilize per cardiac rehab. BP elevated overnight - will monitor. Labs normal.   Objective   Vitals:   08/13/18 1555 08/13/18 1900 08/13/18 2300 08/14/18 0315  BP: (!) 113/92 (!) 147/73 (!) 152/57 (!) 151/62  Pulse: 66 63 71 68  Resp: 16 16 18 11   Temp: 98.2 F (36.8 C) (!) 97.5 F (36.4 C) 98.1 F (36.7 C) 98.1 F (36.7 C)  TempSrc: Oral Oral Oral Oral  SpO2: 97% 98% 99% 94%  Weight:      Height:        Intake/Output Summary (Last 24 hours) at 08/14/2018 0973 Last data filed at 08/14/2018 0000 Gross per 24 hour  Intake 192.97 ml  Output -  Net 192.97 ml   Filed Weights   08/13/18 1121  Weight: 107.9 kg    Physical Exam   General appearance: alert and no distress Lungs: clear to auscultation bilaterally Heart: regular rate and rhythm Extremities: extremities normal, atraumatic, no cyanosis or edema Neurologic: Grossly normal  Inpatient Medications    Scheduled Meds: . aspirin EC  81 mg Oral Daily  . atorvastatin  80 mg Oral q1800  . FLUoxetine  20 mg Oral Daily  . furosemide  20 mg Oral BID  . insulin aspart  0-5 Units Subcutaneous QHS  . insulin aspart  0-9 Units Subcutaneous TID WC  . irbesartan  300 mg Oral Daily  . metoprolol succinate  50 mg Oral BID  . multivitamin with minerals  1 tablet Oral Daily  . potassium chloride SA  20 mEq Oral Daily  . sodium chloride flush  3 mL Intravenous Q12H    Continuous Infusions: . tirofiban 0.1 mcg/kg/min (08/14/18 0333)    PRN  Meds: Melatonin   Labs   Results for orders placed or performed during the hospital encounter of 08/13/18 (from the past 48 hour(s))  Surgical pcr screen     Status: None   Collection Time: 08/13/18 11:34 AM  Result Value Ref Range   MRSA, PCR NEGATIVE NEGATIVE   Staphylococcus aureus NEGATIVE NEGATIVE    Comment: (NOTE) The Xpert SA Assay (FDA approved for NASAL specimens in patients 75 years of age and older), is one component of a comprehensive surveillance program. It is not intended to diagnose infection nor to guide or monitor treatment. Performed at [facility], [place] [address], [place], [place] [address]   HIV antibody (Routine Testing)     Status: None   Collection Time: 08/13/18 12:49 PM  Result Value Ref Range   HIV Screen 4th Generation wRfx Non Reactive Non Reactive    Comment: (NOTE) Performed At: [facility] [place], [place] [id number] Rush Farmer MD QQ:[id number]   Comprehensive metabolic panel     Status: Abnormal   Collection Time: 08/13/18 12:49 PM  Result Value Ref Range   Sodium 139 135 - 145 mmol/L   Potassium 3.9 3.5 - 5.1 mmol/L   Chloride 100 98 -  111 mmol/L   CO2 30 22 - 32 mmol/L   Glucose, Bld 116 (H) 70 - 99 mg/dL   BUN 12 8 - 23 mg/dL   Creatinine, Ser 0.90 0.44 - 1.00 mg/dL   Calcium 9.5 8.9 - 10.3 mg/dL   Total Protein 7.3 6.5 - 8.1 g/dL   Albumin 3.9 3.5 - 5.0 g/dL   AST 21 15 - 41 U/L   ALT 21 0 - 44 U/L   Alkaline Phosphatase 63 38 - 126 U/L   Total Bilirubin 0.6 0.3 - 1.2 mg/dL   GFR calc non Af Amer >60 >60 mL/min   GFR calc Af Amer >60 >60 mL/min   Anion gap 9 5 - 15    Comment: Performed at [facility], [place] [address], [place], [place] [address]  Magnesium     Status: Abnormal   Collection Time: 08/13/18 12:49 PM  Result Value Ref Range   Magnesium 1.5 (L) 1.7 - 2.4 mg/dL    Comment: Performed at [facility] [address] [place], [place] [address]  CBC WITH DIFFERENTIAL      Status: Abnormal   Collection Time: 08/13/18 12:49 PM  Result Value Ref Range   WBC 9.1 4.0 - 10.5 K/uL   RBC 4.56 3.87 - 5.11 MIL/uL   Hemoglobin 12.3 12.0 - 15.0 g/dL   HCT 41.1 36.0 - 46.0 %   MCV 90.1 80.0 - 100.0 fL   MCH 27.0 26.0 - 34.0 pg   MCHC 29.9 (L) 30.0 - 36.0 g/dL   RDW 14.9 11.5 - 15.5 %   Platelets 269 150 - 400 K/uL   nRBC 0.0 0.0 - 0.2 %   Neutrophils Relative % 53 %   Neutro Abs 4.7 1.7 - 7.7 K/uL   Lymphocytes Relative 38 %   Lymphs Abs 3.5 0.7 - 4.0 K/uL   Monocytes Relative 6 %   Monocytes Absolute 0.5 0.1 - 1.0 K/uL   Eosinophils Relative 3 %   Eosinophils Absolute 0.3 0.0 - 0.5 K/uL   Basophils Relative 0 %   Basophils Absolute 0.0 0.0 - 0.1 K/uL   Immature Granulocytes 0 %   Abs Immature Granulocytes 0.02 0.00 - 0.07 K/uL    Comment: Performed at [place] [place] [address], [place], [place] [address]  Protime-INR     Status: None   Collection Time: 08/13/18 12:49 PM  Result Value Ref Range   Prothrombin Time 12.8 11.4 - 15.2 seconds   INR 0.97     Comment: Performed at [facility] [address] [place], [place] [address]  TSH     Status: None   Collection Time: 08/13/18 12:49 PM  Result Value Ref Range   TSH 2.136 0.350 - 4.500 uIU/mL    Comment: Performed by a 3rd Generation assay with a functional sensitivity of <=0.01 uIU/mL. Performed at [facility], [place] [address] [place],  [address]   Hemoglobin A1c     Status: Abnormal   Collection Time: 08/13/18 12:49 PM  Result Value Ref Range   Hgb A1c MFr Bld 6.2 (H) 4.8 - 5.6 %    Comment: (NOTE) Pre diabetes:          5.7%-6.4% Diabetes:              >6.4% Glycemic control for   <7.0% adults with diabetes    Mean Plasma Glucose 131.24 mg/dL    Comment: Performed at [facility], [address], [place], [place] [address]  Glucose, capillary     Status: Abnormal   Collection Time: 08/13/18  5:30 PM  Result Value Ref Range   Glucose-Capillary 167 (H) 70 -  99 mg/dL  Glucose, capillary     Status: Abnormal   Collection Time: 08/13/18  9:34 PM  Result Value Ref Range   Glucose-Capillary 179 (H) 70 - 99 mg/dL  Magnesium     Status: None   Collection Time: 08/14/18  2:37 AM  Result Value Ref Range   Magnesium 2.0 1.7 - 2.4 mg/dL    Comment: Performed at [facility], [place] [address] [place], [place] [address]  Basic metabolic panel     Status: Abnormal   Collection Time: 08/14/18  2:37 AM  Result Value Ref Range   Sodium 137 135 - 145 mmol/L   Potassium 3.8 3.5 - 5.1 mmol/L   Chloride 99 98 - 111 mmol/L   CO2 25 22 - 32 mmol/L   Glucose, Bld 117 (H) 70 - 99 mg/dL   BUN 17 8 - 23 mg/dL   Creatinine, Ser 0.88 0.44 - 1.00 mg/dL   Calcium 9.2 8.9 - 10.3 mg/dL   GFR calc non Af Amer >60 >60 mL/min   GFR calc Af Amer >60 >60 mL/min   Anion gap 13 5 - 15    Comment: Performed at [facility] [address] [place], [place] [address]  CBC     Status: Abnormal   Collection Time: 08/14/18  2:37 AM  Result Value Ref Range   WBC 8.5 4.0 - 10.5 K/uL   RBC 4.28 3.87 - 5.11 MIL/uL   Hemoglobin 11.9 (L) 12.0 - 15.0 g/dL   HCT 39.0 36.0 - 46.0 %   MCV 91.1 80.0 - 100.0 fL   MCH 27.8 26.0 - 34.0 pg   MCHC 30.5 30.0 - 36.0 g/dL   RDW 15.1 11.5 - 15.5 %   Platelets 209 150 - 400 K/uL   nRBC 0.0 0.0 - 0.2 %    Comment: Performed at [facility], [place] [address] [place], [place] [address]  Glucose, capillary     Status: Abnormal   Collection Time: 08/14/18  7:51 AM  Result Value Ref Range   Glucose-Capillary 140 (H) 70 - 99 mg/dL    ECG   N/A  Telemetry   Sinus rhythm - Personally Reviewed  Radiology    Dg Chest 2 View  Result Date: 08/13/2018 CLINICAL DATA:  Heart attack July 16, 2018. No chest complaints today. EXAM: CHEST - 2 VIEW COMPARISON:  October 05, 2012 FINDINGS: The heart size and mediastinal contours are within normal limits. Both lungs are clear. The visualized skeletal structures are unremarkable.  IMPRESSION: No active cardiopulmonary disease. Electronically Signed   By: Dorise Bullion III M.D   On: 08/13/2018 17:26    Cardiac Studies   N/A  Assessment   1. Active Problems: 2.   CAD (coronary artery disease) 3.   Plan   1. Plan for CABG Friday - will check CBC Q48hrs. Agree with cardiac rehab for mobility.  Time Spent Directly with Patient:  I have spent a total of 15 minutes with the patient reviewing hospital notes, telemetry, EKGs, labs and examining the patient as well as establishing an assessment and plan that was discussed personally with the patient.  > 50% of time was spent in direct patient care.  Length of Stay:  LOS: 1 day  Pixie Casino, MD, [facility], [place] Director of the Advanced Lipid Disorders &  Cardiovascular Risk Reduction Clinic Diplomate of the American Board of Clinical Lipidology Attending Cardiologist  Direct Dial: [phone]  Fax: [phone]  Website:  [email or web address] Mitchelle Goerner 08/14/2018, 8:38 AM

## 2018-08-15 ENCOUNTER — Inpatient Hospital Stay (HOSPITAL_COMMUNITY): Payer: BLUE CROSS/BLUE SHIELD

## 2018-08-15 DIAGNOSIS — Z01818 Encounter for other preprocedural examination: Secondary | ICD-10-CM

## 2018-08-15 LAB — CBC
HCT: 40.1 % (ref 36.0–46.0)
Hemoglobin: 12.5 g/dL (ref 12.0–15.0)
MCH: 28 pg (ref 26.0–34.0)
MCHC: 31.2 g/dL (ref 30.0–36.0)
MCV: 89.7 fL (ref 80.0–100.0)
Platelets: 199 10*3/uL (ref 150–400)
RBC: 4.47 MIL/uL (ref 3.87–5.11)
RDW: 15 % (ref 11.5–15.5)
WBC: 8.2 10*3/uL (ref 4.0–10.5)
nRBC: 0 % (ref 0.0–0.2)

## 2018-08-15 LAB — GLUCOSE, CAPILLARY
Glucose-Capillary: 152 mg/dL — ABNORMAL HIGH (ref 70–99)
Glucose-Capillary: 191 mg/dL — ABNORMAL HIGH (ref 70–99)
Glucose-Capillary: 182 mg/dL — ABNORMAL HIGH (ref 70–99)
Glucose-Capillary: 216 mg/dL — ABNORMAL HIGH (ref 70–99)

## 2018-08-15 MED ORDER — IRBESARTAN 150 MG PO TABS: 150.0000 mg | ORAL_TABLET | Freq: Two times a day (BID) | ORAL | Status: DC

## 2018-08-15 MED ORDER — METOPROLOL SUCCINATE ER 50 MG PO TB24
50.0000 mg | ORAL_TABLET | Freq: Two times a day (BID) | ORAL | Status: DC
  Administered 2018-08-15 – 2018-08-16 (×4): 50 mg via ORAL
  Filled 2018-08-15 (×5): qty 1

## 2018-08-15 NOTE — Progress Notes (Signed)
       [place] [address]       [place] [address]             [phone]      Will avoid ace/arb preop- d/c [name] MD      [address] [place] [address] Office [phone]   [place]

## 2018-08-15 NOTE — Progress Notes (Signed)
[place] for tirofiban Indication: effient washout  Allergies  Allergen Reactions  . Oxycodone Nausea Only    Has to take nausea medications if taken. Prefer not to take    Patient Measurements: Height: 5\' 2"  (157.5 cm) Weight: 237 lb 12.8 oz (107.9 kg) IBW/kg (Calculated) : 50.1  Vital Signs: Temp: 97.8 F (36.6 C) (12/04 0749) Temp Source: Oral (12/04 0749) BP: 151/68 (12/04 0749) Pulse Rate: 61 (12/04 0749)  Labs: Recent Labs    08/13/18 1249 08/14/18 0237 08/15/18 0435  HGB 12.3 11.9* 12.5  HCT 41.1 39.0 40.1  PLT 269 209 199  LABPROT 12.8  --   --   INR 0.97  --   --   CREATININE 0.90 0.88  --     Estimated Creatinine Clearance: 75.6 mL/min (by C-G formula based on SCr of 0.88 mg/dL).   Medical History: Past Medical History:  Diagnosis Date  . ALLERGIC RHINITIS, SEASONAL   . Diabetes mellitus without complication ([place])   . DYSLIPIDEMIA   . Dysrhythmia    palpitations-evaluated by Dr Johnsie Cancel 6/12/eccho 6/13  . Glucose intolerance (impaired glucose tolerance)   . History of blood transfusion   . Hypertension   . OBESITY, NOS   . Osteoarthritis    rt knee  . PLANTAR FASCIITIS, BILATERAL   . Vertigo    Assessment: 62 year old female admitted for effient washout, started tirofiban 12/2. Hemoglobin stable, pltc trending down slightly to 199 from 269 on admit.   Planning cabg 12/6  Goal of Therapy:   Monitor platelets by anticoagulation protocol: Yes   Plan:  Tirofiban 12mcg/kg/min Stop at least 6 hours prior to surgery  Erin Hearing PharmD., BCPS Clinical Pharmacist 08/15/2018 10:14 AM

## 2018-08-15 NOTE — Progress Notes (Signed)
Pre CABG testing       has been completed. Preliminary results can be found under CV proc through chart review. Landry Mellow, RDMS, RVT

## 2018-08-15 NOTE — Progress Notes (Signed)
CARDIAC REHAB PHASE I   PRE:  Rate/Rhythm: 60 SR  BP:  Supine:   Sitting: 120/56  Standing:    SaO2: 97%RA Came to see pt to walk. Pt stated has walked 3 times already. Has been using IS. Plans to watch pre op video when husband arrives. Has read [facility] booklet. Will follow up after surgery as pt can walk indpendently and ed has been completed.   [id number]       Graylon Good, RN BSN  08/15/2018 11:53 AM

## 2018-08-15 NOTE — Progress Notes (Signed)
DAILY PROGRESS NOTE   Patient Name: Pamela Thomas Date of Encounter: 08/15/2018  Chief Complaint   No events overnight  Patient Profile   RONIQUE SIMERLY is a 62 y.o. female with recent STEMI of RCA 07/16/18 in [place] at [facility].  She rec'd stent to RCA and also with residual disease in LAD.  Other hx of diabetes, HLD, and palpitations. On follow up decision was treatment of LAD.  Films reviewed with Dr. Burt Knack and Dr. Servando Snare.   Subjective   Stable overnight - echo yesterday shows LVEF 55-60%, grade 2 DD, mild LVH, trace AI, mild MR. Plan for CABG on Friday. H/H stable.  Objective   Vitals:   08/14/18 1600 08/14/18 1940 08/15/18 0352 08/15/18 0749  BP: 118/90 (!) 156/72 (!) 129/57 (!) 151/68  Pulse: (!) 58 60  61  Resp: 20 18 17 19   Temp: 97.8 F (36.6 C) 98 F (36.7 C) 98 F (36.7 C) 97.8 F (36.6 C)  TempSrc: Oral Oral Oral Oral  SpO2: 99% 97% 98% 98%  Weight:      Height:        Intake/Output Summary (Last 24 hours) at 08/15/2018 0756 Last data filed at 08/15/2018 0600 Gross per 24 hour  Intake 1629.55 ml  Output 2 ml  Net 1627.55 ml   Filed Weights   08/13/18 1121  Weight: 107.9 kg    Physical Exam  Exam remains unchanged on 12/4  General appearance: alert and no distress Lungs: clear to auscultation bilaterally Heart: regular rate and rhythm Extremities: extremities normal, atraumatic, no cyanosis or edema Neurologic: Grossly normal  Inpatient Medications    Scheduled Meds: . aspirin EC  81 mg Oral Daily  . atorvastatin  80 mg Oral q1800  . FLUoxetine  20 mg Oral Daily  . furosemide  20 mg Oral BID  . insulin aspart  0-5 Units Subcutaneous QHS  . insulin aspart  0-9 Units Subcutaneous TID WC  . irbesartan  300 mg Oral Daily  . metoprolol succinate  50 mg Oral BID  . multivitamin with minerals  1 tablet Oral Daily  . potassium chloride SA  20 mEq Oral Daily  . sodium chloride flush  3 mL Intravenous Q12H    Continuous Infusions: .  tirofiban 0.1 mcg/kg/min (08/15/18 0600)    PRN Meds: Melatonin   Labs   Results for orders placed or performed during the hospital encounter of 08/13/18 (from the past 48 hour(s))  Surgical pcr screen     Status: None   Collection Time: 08/13/18 11:34 AM  Result Value Ref Range   MRSA, PCR NEGATIVE NEGATIVE   Staphylococcus aureus NEGATIVE NEGATIVE    Comment: (NOTE) The Xpert SA Assay (FDA approved for NASAL specimens in patients 22 years of age and older), is one component of a comprehensive surveillance program. It is not intended to diagnose infection nor to guide or monitor treatment. Performed at [facility], [place] [address], [place], [place] [address]   HIV antibody (Routine Testing)     Status: None   Collection Time: 08/13/18 12:49 PM  Result Value Ref Range   HIV Screen 4th Generation wRfx Non Reactive Non Reactive    Comment: (NOTE) Performed At: [facility] [place], [place] [id number] Rush Farmer MD [id number]   Comprehensive metabolic panel     Status: Abnormal   Collection Time: 08/13/18 12:49 PM  Result Value Ref Range   Sodium 139 135 - 145 mmol/L  Potassium 3.9 3.5 - 5.1 mmol/L   Chloride 100 98 - 111 mmol/L   CO2 30 22 - 32 mmol/L   Glucose, Bld 116 (H) 70 - 99 mg/dL   BUN 12 8 - 23 mg/dL   Creatinine, Ser 0.90 0.44 - 1.00 mg/dL   Calcium 9.5 8.9 - 10.3 mg/dL   Total Protein 7.3 6.5 - 8.1 g/dL   Albumin 3.9 3.5 - 5.0 g/dL   AST 21 15 - 41 U/L   ALT 21 0 - 44 U/L   Alkaline Phosphatase 63 38 - 126 U/L   Total Bilirubin 0.6 0.3 - 1.2 mg/dL   GFR calc non Af Amer >60 >60 mL/min   GFR calc Af Amer >60 >60 mL/min   Anion gap 9 5 - 15    Comment: Performed at [facility] [address] [place], [place] [address]  Magnesium     Status: Abnormal   Collection Time: 08/13/18 12:49 PM  Result Value Ref Range   Magnesium 1.5 (L) 1.7 - 2.4 mg/dL    Comment: Performed at [facility] [address] [place], [place] [address]  CBC WITH DIFFERENTIAL     Status: Abnormal   Collection Time: 08/13/18 12:49 PM  Result Value Ref Range   WBC 9.1 4.0 - 10.5 K/uL   RBC 4.56 3.87 - 5.11 MIL/uL   Hemoglobin 12.3 12.0 - 15.0 g/dL   HCT 41.1 36.0 - 46.0 %   MCV 90.1 80.0 - 100.0 fL   MCH 27.0 26.0 - 34.0 pg   MCHC 29.9 (L) 30.0 - 36.0 g/dL   RDW 14.9 11.5 - 15.5 %   Platelets 269 150 - 400 K/uL   nRBC 0.0 0.0 - 0.2 %   Neutrophils Relative % 53 %   Neutro Abs 4.7 1.7 - 7.7 K/uL   Lymphocytes Relative 38 %   Lymphs Abs 3.5 0.7 - 4.0 K/uL   Monocytes Relative 6 %   Monocytes Absolute 0.5 0.1 - 1.0 K/uL   Eosinophils Relative 3 %   Eosinophils Absolute 0.3 0.0 - 0.5 K/uL   Basophils Relative 0 %   Basophils Absolute 0.0 0.0 - 0.1 K/uL   Immature Granulocytes 0 %   Abs Immature Granulocytes 0.02 0.00 - 0.07 K/uL    Comment: Performed at [facility] [address], [place], [place] [address]  Protime-INR     Status: None   Collection Time: 08/13/18 12:49 PM  Result Value Ref Range   Prothrombin Time 12.8 11.4 - 15.2 seconds   INR 0.97     Comment: Performed at [facility] [address], [place],  [address]  TSH     Status: None   Collection Time: 08/13/18 12:49 PM  Result Value Ref Range   TSH 2.136 0.350 - 4.500 uIU/mL    Comment: Performed by a 3rd Generation assay with a functional sensitivity of <=0.01 uIU/mL. Performed at [facility], [place] [address], [place],  [address]   Hemoglobin A1c     Status: Abnormal   Collection Time: 08/13/18 12:49 PM  Result Value Ref Range   Hgb A1c MFr Bld 6.2 (H) 4.8 - 5.6 %    Comment: (NOTE) Pre diabetes:          5.7%-6.4% Diabetes:              >6.4% Glycemic control for   <7.0% adults with diabetes    Mean Plasma  Glucose 131.24 mg/dL    Comment: Performed at [facility], [place] [address], [place], [place] [address]  Glucose, capillary     Status: Abnormal   Collection Time: 08/13/18  5:30 PM    Result Value Ref Range   Glucose-Capillary 167 (H) 70 - 99 mg/dL  Glucose, capillary     Status: Abnormal   Collection Time: 08/13/18  9:34 PM  Result Value Ref Range   Glucose-Capillary 179 (H) 70 - 99 mg/dL  Magnesium     Status: None   Collection Time: 08/14/18  2:37 AM  Result Value Ref Range   Magnesium 2.0 1.7 - 2.4 mg/dL    Comment: Performed at [facility], [place] [address], [place], [place] [address]  Basic metabolic panel     Status: Abnormal   Collection Time: 08/14/18  2:37 AM  Result Value Ref Range   Sodium 137 135 - 145 mmol/L   Potassium 3.8 3.5 - 5.1 mmol/L   Chloride 99 98 - 111 mmol/L   CO2 25 22 - 32 mmol/L   Glucose, Bld 117 (H) 70 - 99 mg/dL   BUN 17 8 - 23 mg/dL   Creatinine, Ser 0.88 0.44 - 1.00 mg/dL   Calcium 9.2 8.9 - 10.3 mg/dL   GFR calc non Af Amer >60 >60 mL/min   GFR calc Af Amer >60 >60 mL/min   Anion gap 13 5 - 15    Comment: Performed at [facility] [address] [place],  [address]  CBC     Status: Abnormal   Collection Time: 08/14/18  2:37 AM  Result Value Ref Range   WBC 8.5 4.0 - 10.5 K/uL   RBC 4.28 3.87 - 5.11 MIL/uL   Hemoglobin 11.9 (L) 12.0 - 15.0 g/dL   HCT 39.0 36.0 - 46.0 %   MCV 91.1 80.0 - 100.0 fL   MCH 27.8 26.0 - 34.0 pg   MCHC 30.5 30.0 - 36.0 g/dL   RDW 15.1 11.5 - 15.5 %   Platelets 209 150 - 400 K/uL   nRBC 0.0 0.0 - 0.2 %    Comment: Performed at [facility], [place] [address] [place], [place] [address]  Glucose, capillary     Status: Abnormal   Collection Time: 08/14/18  7:51 AM  Result Value Ref Range   Glucose-Capillary 140 (H) 70 - 99 mg/dL  Glucose, capillary     Status: None   Collection Time: 08/14/18 11:57 AM  Result Value Ref Range   Glucose-Capillary 88 70 - 99 mg/dL  Glucose, capillary     Status: None   Collection Time: 08/14/18  4:37 PM  Result Value Ref Range   Glucose-Capillary 84 70 - 99 mg/dL  Glucose, capillary     Status: Abnormal   Collection Time: 08/14/18   9:30 PM  Result Value Ref Range   Glucose-Capillary 211 (H) 70 - 99 mg/dL  CBC     Status: None   Collection Time: 08/15/18  4:35 AM  Result Value Ref Range   WBC 8.2 4.0 - 10.5 K/uL   RBC 4.47 3.87 - 5.11 MIL/uL   Hemoglobin 12.5 12.0 - 15.0 g/dL   HCT 40.1 36.0 - 46.0 %   MCV 89.7 80.0 - 100.0 fL   MCH 28.0 26.0 - 34.0 pg   MCHC 31.2 30.0 - 36.0 g/dL   RDW 15.0 11.5 - 15.5 %   Platelets 199 150 - 400 K/uL  nRBC 0.0 0.0 - 0.2 %    Comment: Performed at [facility], [place] [address], [place], [place] [address]    ECG   N/A  Telemetry   Sinus rhythm - Personally Reviewed  Radiology    Dg Chest 2 View  Result Date: 08/13/2018 CLINICAL DATA:  Heart attack July 16, 2018. No chest complaints today. EXAM: CHEST - 2 VIEW COMPARISON:  October 05, 2012 FINDINGS: The heart size and mediastinal contours are within normal limits. Both lungs are clear. The visualized skeletal structures are unremarkable. IMPRESSION: No active cardiopulmonary disease. Electronically Signed   By: Dorise Bullion III M.D   On: 08/13/2018 17:26    Cardiac Studies   N/A  Assessment   Active Problems:   CAD (coronary artery disease)   Plan   1. Plan for CABG Friday - no medicine changes. Labs stable.  Time Spent Directly with Patient:  I have spent a total of 15 minutes with the patient reviewing hospital notes, telemetry, EKGs, labs and examining the patient as well as establishing an assessment and plan that was discussed personally with the patient.  > 50% of time was spent in direct patient care.  Length of Stay:  LOS: 2 days   Pixie Casino, MD, [facility], [place] Director of the Advanced Lipid Disorders &  Cardiovascular Risk Reduction Clinic Diplomate of the American Board of Clinical Lipidology Attending Cardiologist  Direct Dial: [phone]  Fax: [phone]  Website:  [email or web address] 08/15/2018, 7:56 AM

## 2018-08-16 DIAGNOSIS — I2510 Atherosclerotic heart disease of native coronary artery without angina pectoris: Secondary | ICD-10-CM

## 2018-08-16 LAB — URINALYSIS, ROUTINE W REFLEX MICROSCOPIC
Bilirubin Urine: NEGATIVE
Glucose, UA: 50 mg/dL — AB
Ketones, ur: NEGATIVE mg/dL
Nitrite: NEGATIVE
Protein, ur: NEGATIVE mg/dL
Specific Gravity, Urine: 1.016 (ref 1.005–1.030)
pH: 5 (ref 5.0–8.0)

## 2018-08-16 LAB — CBC
HCT: 39.9 % (ref 36.0–46.0)
Hemoglobin: 12.1 g/dL (ref 12.0–15.0)
MCH: 27.3 pg (ref 26.0–34.0)
MCHC: 30.3 g/dL (ref 30.0–36.0)
MCV: 90.1 fL (ref 80.0–100.0)
Platelets: 225 10*3/uL (ref 150–400)
RBC: 4.43 MIL/uL (ref 3.87–5.11)
RDW: 15 % (ref 11.5–15.5)
WBC: 7.9 10*3/uL (ref 4.0–10.5)
nRBC: 0 % (ref 0.0–0.2)

## 2018-08-16 LAB — GLUCOSE, CAPILLARY
Glucose-Capillary: 136 mg/dL — ABNORMAL HIGH (ref 70–99)
Glucose-Capillary: 195 mg/dL — ABNORMAL HIGH (ref 70–99)
Glucose-Capillary: 176 mg/dL — ABNORMAL HIGH (ref 70–99)
Glucose-Capillary: 134 mg/dL — ABNORMAL HIGH (ref 70–99)

## 2018-08-16 LAB — BASIC METABOLIC PANEL
Anion gap: 9 (ref 5–15)
BUN: 15 mg/dL (ref 8–23)
CO2: 28 mmol/L (ref 22–32)
Calcium: 9.1 mg/dL (ref 8.9–10.3)
Chloride: 102 mmol/L (ref 98–111)
Creatinine, Ser: 0.82 mg/dL (ref 0.44–1.00)
GFR calc Af Amer: >60 mL/min (ref >60)
GFR calc non Af Amer: >60 mL/min (ref >60)
Glucose, Bld: 128 mg/dL — ABNORMAL HIGH (ref 70–99)
Potassium: 4.3 mmol/L (ref 3.5–5.1)
Sodium: 139 mmol/L (ref 135–145)

## 2018-08-16 LAB — BLOOD GAS, ARTERIAL
Acid-Base Excess: 2.8 mmol/L — ABNORMAL HIGH (ref 0.0–2.0)
Bicarbonate: 26.6 mmol/L (ref 20.0–28.0)
Drawn by: 51155
O2 Content: 21 L/min
O2 Saturation: 97.7 %
Patient temperature: 98.6
pCO2 arterial: 39 mmHg (ref 32.0–48.0)
pH, Arterial: 7.448 (ref 7.350–7.450)
pO2, Arterial: 90 mmHg (ref 83.0–108.0)

## 2018-08-16 LAB — APTT: aPTT: 31 seconds (ref 24–36)

## 2018-08-16 LAB — ABO/RH: ABO/RH(D): AB NEG

## 2018-08-16 MED ORDER — CHLORHEXIDINE GLUCONATE CLOTH 2 % EX PADS
6.0000 | MEDICATED_PAD | Freq: Once | CUTANEOUS | Status: AC
  Administered 2018-08-17: 6 via TOPICAL

## 2018-08-16 MED ORDER — PLASMA-LYTE 148 IV SOLN
INTRAVENOUS | Status: AC
  Administered 2018-08-17: 500 mL
  Filled 2018-08-16: qty 2.5

## 2018-08-16 MED ORDER — EPINEPHRINE PF 1 MG/ML IJ SOLN
0.0000 ug/min | INTRAVENOUS | Status: DC
  Filled 2018-08-16: qty 4

## 2018-08-16 MED ORDER — DEXMEDETOMIDINE HCL IN NACL 400 MCG/100ML IV SOLN
0.1000 ug/kg/h | INTRAVENOUS | Status: AC
  Administered 2018-08-17: 0.7 ug/kg/h via INTRAVENOUS
  Filled 2018-08-16: qty 100

## 2018-08-16 MED ORDER — TIROFIBAN HCL IV 12.5 MG/250 ML
0.1000 ug/kg/min | INTRAVENOUS | Status: DC
  Administered 2018-08-16: 0.1 ug/kg/min via INTRAVENOUS
  Filled 2018-08-16: qty 250

## 2018-08-16 MED ORDER — NITROGLYCERIN IN D5W 200-5 MCG/ML-% IV SOLN
2.0000 ug/min | INTRAVENOUS | Status: AC
  Administered 2018-08-17: 5 ug/min via INTRAVENOUS
  Filled 2018-08-16: qty 250

## 2018-08-16 MED ORDER — DOPAMINE-DEXTROSE 3.2-5 MG/ML-% IV SOLN
0.0000 ug/kg/min | INTRAVENOUS | Status: DC
  Filled 2018-08-16: qty 250

## 2018-08-16 MED ORDER — NOREPINEPHRINE 4 MG/250ML-% IV SOLN
0.0000 ug/min | INTRAVENOUS | Status: DC
  Filled 2018-08-16: qty 250

## 2018-08-16 MED ORDER — INSULIN REGULAR(HUMAN) IN NACL 100-0.9 UT/100ML-% IV SOLN
INTRAVENOUS | Status: AC
  Administered 2018-08-17: 2.1 [IU]/h via INTRAVENOUS
  Filled 2018-08-16: qty 100

## 2018-08-16 MED ORDER — BISACODYL 5 MG PO TBEC
5.0000 mg | DELAYED_RELEASE_TABLET | Freq: Once | ORAL | Status: AC
  Administered 2018-08-16: 5 mg via ORAL
  Filled 2018-08-16: qty 1

## 2018-08-16 MED ORDER — TRANEXAMIC ACID 1000 MG/10ML IV SOLN
1.5000 mg/kg/h | INTRAVENOUS | Status: AC
  Administered 2018-08-17: 1.5 mg/kg/h via INTRAVENOUS
  Filled 2018-08-16 (×2): qty 25

## 2018-08-16 MED ORDER — TEMAZEPAM 15 MG PO CAPS
15.0000 mg | ORAL_CAPSULE | Freq: Once | ORAL | Status: AC | PRN
  Administered 2018-08-16: 15 mg via ORAL
  Filled 2018-08-16: qty 1

## 2018-08-16 MED ORDER — METOPROLOL TARTRATE 12.5 MG HALF TABLET
12.5000 mg | ORAL_TABLET | Freq: Once | ORAL | Status: AC
  Administered 2018-08-17: 12.5 mg via ORAL
  Filled 2018-08-16: qty 1

## 2018-08-16 MED ORDER — POTASSIUM CHLORIDE 2 MEQ/ML IV SOLN
80.0000 meq | INTRAVENOUS | Status: DC
  Filled 2018-08-16: qty 40

## 2018-08-16 MED ORDER — PHENYLEPHRINE HCL-NACL 20-0.9 MG/250ML-% IV SOLN
30.0000 ug/min | INTRAVENOUS | Status: DC
  Filled 2018-08-16: qty 250

## 2018-08-16 MED ORDER — CHLORHEXIDINE GLUCONATE 0.12 % MT SOLN
15.0000 mL | Freq: Once | OROMUCOSAL | Status: AC
  Administered 2018-08-17: 15 mL via OROMUCOSAL
  Filled 2018-08-16: qty 15

## 2018-08-16 MED ORDER — VANCOMYCIN HCL 10 G IV SOLR
1500.0000 mg | INTRAVENOUS | Status: AC
  Administered 2018-08-17: 1500 mg via INTRAVENOUS
  Filled 2018-08-16: qty 1500

## 2018-08-16 MED ORDER — TRANEXAMIC ACID (OHS) PUMP PRIME SOLUTION
2.0000 mg/kg | INTRAVENOUS | Status: DC
  Filled 2018-08-16: qty 2.16

## 2018-08-16 MED ORDER — MAGNESIUM SULFATE 50 % IJ SOLN
40.0000 meq | INTRAMUSCULAR | Status: DC
  Filled 2018-08-16: qty 9.85

## 2018-08-16 MED ORDER — MILRINONE LACTATE IN DEXTROSE 20-5 MG/100ML-% IV SOLN
0.3000 ug/kg/min | INTRAVENOUS | Status: DC
  Filled 2018-08-16: qty 100

## 2018-08-16 MED ORDER — TRANEXAMIC ACID (OHS) BOLUS VIA INFUSION
15.0000 mg/kg | INTRAVENOUS | Status: AC
  Administered 2018-08-17: 1618.5 mg via INTRAVENOUS
  Filled 2018-08-16: qty 1619

## 2018-08-16 MED ORDER — CHLORHEXIDINE GLUCONATE CLOTH 2 % EX PADS
6.0000 | MEDICATED_PAD | Freq: Once | CUTANEOUS | Status: AC
  Administered 2018-08-16: 6 via TOPICAL

## 2018-08-16 MED ORDER — SODIUM CHLORIDE 0.9 % IV SOLN
750.0000 mg | INTRAVENOUS | Status: DC
  Filled 2018-08-16: qty 750

## 2018-08-16 MED ORDER — SODIUM CHLORIDE 0.9 % IV SOLN
INTRAVENOUS | Status: DC
  Filled 2018-08-16: qty 30

## 2018-08-16 MED ORDER — SODIUM CHLORIDE 0.9 % IV SOLN
1.5000 g | INTRAVENOUS | Status: AC
  Administered 2018-08-17: 1.5 g via INTRAVENOUS
  Filled 2018-08-16: qty 1.5

## 2018-08-16 NOTE — Progress Notes (Signed)
DAILY PROGRESS NOTE   Patient Name: Pamela Thomas Date of Encounter: 08/16/2018  Chief Complaint   No events overnight  Patient Profile   Pamela Thomas is a 62 y.o. female with recent STEMI of RCA 07/16/18 in [place] at [facility].  She rec'd stent to RCA and also with residual disease in LAD.  Other hx of diabetes, HLD, and palpitations. On follow up decision was treatment of LAD.  Films reviewed with Dr. Burt Knack and Dr. Servando Snare.   Subjective   Seems nervous about surgery. ARB stopped d/t trying to prevent renal dysfunction post-op.  Objective   Vitals:   08/15/18 1956 08/15/18 2143 08/15/18 2341 08/16/18 0354  BP: (!) 154/66  (!) 143/61 (!) 113/53  Pulse: 71  (!) 58 60  Resp: 19 17 16 18   Temp: 98.2 F (36.8 C)  97.9 F (36.6 C) 98.8 F (37.1 C)  TempSrc: Oral  Oral Oral  SpO2: 98% 93% 94% 95%  Weight:      Height:        Intake/Output Summary (Last 24 hours) at 08/16/2018 0830 Last data filed at 08/16/2018 0400 Gross per 24 hour  Intake 524.81 ml  Output -  Net 524.81 ml   Filed Weights   08/13/18 1121  Weight: 107.9 kg    Physical Exam  Exam remains unchanged on 12/5  General appearance: alert and no distress Lungs: clear to auscultation bilaterally Heart: regular rate and rhythm Extremities: extremities normal, atraumatic, no cyanosis or edema Neurologic: Grossly normal  Inpatient Medications    Scheduled Meds: . aspirin EC  81 mg Oral Daily  . atorvastatin  80 mg Oral q1800  . FLUoxetine  20 mg Oral Daily  . furosemide  20 mg Oral BID  . insulin aspart  0-5 Units Subcutaneous QHS  . insulin aspart  0-9 Units Subcutaneous TID WC  . metoprolol succinate  50 mg Oral BID  . multivitamin with minerals  1 tablet Oral Daily  . potassium chloride SA  20 mEq Oral Daily  . sodium chloride flush  3 mL Intravenous Q12H    Continuous Infusions: . tirofiban 0.1 mcg/kg/min (08/16/18 0400)    PRN Meds: Melatonin   Labs   Results for orders  placed or performed during the hospital encounter of 08/13/18 (from the past 48 hour(s))  Glucose, capillary     Status: None   Collection Time: 08/14/18 11:57 AM  Result Value Ref Range   Glucose-Capillary 88 70 - 99 mg/dL  Glucose, capillary     Status: None   Collection Time: 08/14/18  4:37 PM  Result Value Ref Range   Glucose-Capillary 84 70 - 99 mg/dL  Glucose, capillary     Status: Abnormal   Collection Time: 08/14/18  9:30 PM  Result Value Ref Range   Glucose-Capillary 211 (H) 70 - 99 mg/dL  CBC     Status: None   Collection Time: 08/15/18  4:35 AM  Result Value Ref Range   WBC 8.2 4.0 - 10.5 K/uL   RBC 4.47 3.87 - 5.11 MIL/uL   Hemoglobin 12.5 12.0 - 15.0 g/dL   HCT 40.1 36.0 - 46.0 %   MCV 89.7 80.0 - 100.0 fL   MCH 28.0 26.0 - 34.0 pg   MCHC 31.2 30.0 - 36.0 g/dL   RDW 15.0 11.5 - 15.5 %   Platelets 199 150 - 400 K/uL   nRBC 0.0 0.0 - 0.2 %    Comment: Performed at [facility], [address] [place], [place] [address]  Glucose, capillary     Status: Abnormal   Collection Time: 08/15/18  7:55 AM  Result Value Ref Range   Glucose-Capillary 152 (H) 70 - 99 mg/dL  Glucose, capillary     Status: Abnormal   Collection Time: 08/15/18 11:55 AM  Result Value Ref Range   Glucose-Capillary 191 (H) 70 - 99 mg/dL  Glucose, capillary     Status: Abnormal   Collection Time: 08/15/18  4:38 PM  Result Value Ref Range   Glucose-Capillary 182 (H) 70 - 99 mg/dL  Glucose, capillary     Status: Abnormal   Collection Time: 08/15/18  9:09 PM  Result Value Ref Range   Glucose-Capillary 216 (H) 70 - 99 mg/dL  Basic metabolic panel     Status: Abnormal   Collection Time: 08/16/18  2:58 AM  Result Value Ref Range   Sodium 139 135 - 145 mmol/L   Potassium 4.3 3.5 - 5.1 mmol/L   Chloride 102 98 - 111 mmol/L   CO2 28 22 - 32 mmol/L   Glucose, Bld 128 (H) 70 - 99 mg/dL   BUN 15 8 - 23 mg/dL   Creatinine, Ser 0.82 0.44 - 1.00 mg/dL   Calcium 9.1 8.9 - 10.3 mg/dL   GFR calc non  Af Amer >60 >60 mL/min   GFR calc Af Amer >60 >60 mL/min   Anion gap 9 5 - 15    Comment: Performed at [facility], [place] [address], [place] [address]  CBC     Status: None   Collection Time: 08/16/18  2:58 AM  Result Value Ref Range   WBC 7.9 4.0 - 10.5 K/uL   RBC 4.43 3.87 - 5.11 MIL/uL   Hemoglobin 12.1 12.0 - 15.0 g/dL   HCT 39.9 36.0 - 46.0 %   MCV 90.1 80.0 - 100.0 fL   MCH 27.3 26.0 - 34.0 pg   MCHC 30.3 30.0 - 36.0 g/dL   RDW 15.0 11.5 - 15.5 %   Platelets 225 150 - 400 K/uL   nRBC 0.0 0.0 - 0.2 %    Comment: Performed at [facility], [place] [address], [place], [place] [address]  Type and screen [place]     Status: None   Collection Time: 08/16/18  3:03 AM  Result Value Ref Range   ABO/RH(D) AB NEG    Antibody Screen NEG    Sample Expiration      08/19/2018 Performed at [facility], [place] [address] [place], [place] [address]   ABO/Rh     Status: None (Preliminary result)   Collection Time: 08/16/18  3:03 AM  Result Value Ref Range   ABO/RH(D)      AB NEG Performed at [facility] [address] [place],  [address]   Glucose, capillary     Status: Abnormal   Collection Time: 08/16/18  8:05 AM  Result Value Ref Range   Glucose-Capillary 136 (H) 70 - 99 mg/dL    ECG   N/A  Telemetry   Sinus rhythm - Personally Reviewed  Radiology    Vas US Doppler Pre Cabg  Result Date: 08/15/2018 PREOPERATIVE VASCULAR EVALUATION  Indications: Pre CABG. Performing Technologist: Landry Mellow RDMS, RVT  Examination Guidelines: A complete evaluation includes B-mode imaging, spectral Doppler, color Doppler, and power Doppler as needed of all accessible portions of each vessel. Bilateral testing is considered an integral part  of a complete examination. Limited examinations for reoccurring indications may be performed as noted.  Right Carotid Findings: +----------+--------+--------+--------+------------+--------+            PSV cm/sEDV cm/sStenosisDescribe    Comments +----------+--------+--------+--------+------------+--------+ CCA Prox  96      13                                   +----------+--------+--------+--------+------------+--------+ CCA Distal66      12                                   +----------+--------+--------+--------+------------+--------+ ICA Prox  141     44      40-59%  heterogenous         +----------+--------+--------+--------+------------+--------+ ICA Mid   109     29                                   +----------+--------+--------+--------+------------+--------+ ICA Distal83      20                                   +----------+--------+--------+--------+------------+--------+ ECA       73      4                                    +----------+--------+--------+--------+------------+--------+ Portions of this table do not appear on this page. +----------+--------+-------+----------------+------------+           PSV cm/sEDV cmsDescribe        Arm Pressure +----------+--------+-------+----------------+------------+ Subclavian161            Multiphasic, [id number]          +----------+--------+-------+----------------+------------+ +---------+--------+--+--------+--+---------+ VertebralPSV cm/s60EDV cm/s17Antegrade +---------+--------+--+--------+--+---------+ Left Carotid Findings: +----------+--------+--------+--------+-----------+------------------+           PSV cm/sEDV cm/sStenosisDescribe   Comments           +----------+--------+--------+--------+-----------+------------------+ CCA Prox  113     22                                            +----------+--------+--------+--------+-----------+------------------+ CCA Distal77      22                                            +----------+--------+--------+--------+-----------+------------------+ ICA Prox  177     53      40-59%  homogeneousupper end of range  +----------+--------+--------+--------+-----------+------------------+ ICA Mid   126     32                                            +----------+--------+--------+--------+-----------+------------------+ ICA Distal135     31                                            +----------+--------+--------+--------+-----------+------------------+  ECA       149     12                                            +----------+--------+--------+--------+-----------+------------------+ +----------+--------+--------+----------------+------------+ SubclavianPSV cm/sEDV cm/sDescribe        Arm Pressure +----------+--------+--------+----------------+------------+           184             Multiphasic, [id number]          +----------+--------+--------+----------------+------------+ +---------+--------+--+--------+--+---------+ VertebralPSV cm/s78EDV cm/s17Antegrade +---------+--------+--+--------+--+---------+  ABI Findings: +--------+------------------+-----+---------+--------+ Right   Rt Pressure (mmHg)IndexWaveform Comment  +--------+------------------+-----+---------+--------+ [id number]                    triphasic         +--------+------------------+-----+---------+--------+ ATA     124               0.98 triphasic         +--------+------------------+-----+---------+--------+ PTA     120               0.95 triphasic         +--------+------------------+-----+---------+--------+ +--------+------------------+-----+---------+-------+ Left    Lt Pressure (mmHg)IndexWaveform Comment +--------+------------------+-----+---------+-------+ [id number]                    triphasic        +--------+------------------+-----+---------+-------+ ATA     117               0.93 triphasic        +--------+------------------+-----+---------+-------+ PTA     131               1.04 triphasic        +--------+------------------+-----+---------+-------+                                                  +--------+------------------+-----+---------+-------+  Right Doppler Findings: +--------+--------+-----+---------+--------+ Site    PressureIndexDoppler  Comments +--------+--------+-----+---------+--------+ [id number]          triphasic         +--------+--------+-----+---------+--------+ Radial               triphasic         +--------+--------+-----+---------+--------+ Ulnar                biphasic          +--------+--------+-----+---------+--------+  Left Doppler Findings: +--------+--------+-----+---------+--------+ Site    PressureIndexDoppler  Comments +--------+--------+-----+---------+--------+ [id number]          triphasic         +--------+--------+-----+---------+--------+ Radial               triphasic         +--------+--------+-----+---------+--------+ Ulnar                triphasic         +--------+--------+-----+---------+--------+  Summary: Right Carotid: Velocities in the right ICA are consistent with a 40-59%                stenosis. Left Carotid: Velocities in the left ICA are consistent with a 40-59% stenosis. Vertebrals: Bilateral vertebral arteries demonstrate antegrade flow. Right ABI: Resting right ankle-brachial index is within normal range. No evidence of significant right lower  extremity arterial disease. Left ABI: Resting left ankle-brachial index is within normal range. No evidence of significant left lower extremity arterial disease. Right Upper Extremity: Doppler waveforms remain within normal limits with right radial compression. Doppler waveforms remain within normal limits with right ulnar compression. Left Upper Extremity: Doppler waveforms decrease >50% with left radial compression. Doppler waveforms decrease >50% with left ulnar compression.  Electronically signed by Quay Burow MD on 08/15/2018 at 4:47:35 PM.    Final     Cardiac Studies   N/A  Assessment   Active Problems:    CAD (coronary artery disease)   Plan   1. Plan for CABG Friday. Holding ARB for surgery per Dr. Servando Snare.  Time Spent Directly with Patient:  I have spent a total of 15 minutes with the patient reviewing hospital notes, telemetry, EKGs, labs and examining the patient as well as establishing an assessment and plan that was discussed personally with the patient.  > 50% of time was spent in direct patient care.  Length of Stay:  LOS: 3 days   Pixie Casino, MD, [facility], [place] Director of the Advanced Lipid Disorders &  Cardiovascular Risk Reduction Clinic Diplomate of the American Board of Clinical Lipidology Attending Cardiologist  Direct Dial: [phone]  Fax: [phone]  Website:  [email or web address] 08/16/2018, 8:30 AM

## 2018-08-16 NOTE — Progress Notes (Signed)
[address]       [place] [address]             [phone]                    Pamela Thomas [facility] Medical Record #[id number] Date of Birth: 17-Dec-1955  Referring: Dr Percival Spanish Primary Care: Pamela La, MD Primary Cardiologist: Minus Breeding, MD  Chief Complaint:    cad   History of Present Illness:    Pamela Thomas 62 y.o. female is seen in the office for evaluation for coronary artery bypass grafting.  The patient noted the 2 to 3 months ago she began having trouble sleeping, increasing shortness of breath, orthopnea, and aching in her jaw.  These symptoms culminated in an acute inferior myocardial infarction on November 4 while she was in [place].  At the time emergency cardiac catheterization showed a totally occluded right coronary artery.  This was treated with angioplasty and stent placement, see full note below.  In addition she was noted to have significant LAD diagonal disease and a very small nondominant circumflex.  She was discharged home from [facility] and referred to Dr. Percival Spanish.   She notes since the acute intervention she "feels like new", the sleeping difficulty has improved, she denies angina or orthopnea.  She she does note that her activity level has not been as high on purpose.  The patient has a history of diabetes mellitus for the past 8 years currently on metformin.  She has a distant history of cigarette use but quit in 1987 when her father was diagnosed with lung cancer.    This has been admitted to transition off Brilinta    Current Activity/ Functional Status:  Patient is independent with mobility/ambulation, transfers, ADL's, IADL's.   Zubrod Score: At the time of surgery this patient's most appropriate activity status/level should be described as: []     0    Normal activity, no symptoms [x]     1    Restricted in physical strenuous activity but ambulatory, able to do out light work []     2     Ambulatory and capable of self care, unable to do work activities, up and about               >50 % of waking hours                              []     3    Only limited self care, in bed greater than 50% of waking hours []     4    Completely disabled, no self care, confined to bed or chair []     5    Moribund   Past Medical History:  Diagnosis Date  . ALLERGIC RHINITIS, SEASONAL   . Diabetes mellitus without complication ([place])   . DYSLIPIDEMIA   . Dysrhythmia    palpitations-evaluated by Dr Johnsie Cancel 6/12/eccho 6/13  . Glucose intolerance (impaired glucose tolerance)   . History of blood transfusion   . Hypertension   . OBESITY, NOS   . Osteoarthritis    rt knee  . PLANTAR FASCIITIS, BILATERAL   . Vertigo     Past Surgical History:  Procedure Laterality Date  . BREAST BIOPSY  05/27/03   left  . CARPAL TUNNEL RELEASE  05/27/03   Right  . CESAREAN SECTION  05/27/03  .  KNEE SURGERY     left- removal fatty tumor  . ROTATOR CUFF REPAIR     Bilateral  . TOTAL KNEE ARTHROPLASTY  10/10/2012   Procedure: TOTAL KNEE ARTHROPLASTY;  Surgeon: Gearlean Alf, MD;  Location: [facility];  Service: Orthopedics;  Laterality: Right;    Family History  Problem Relation Age of Onset  . COPD Mother        Deceased  . Lung cancer Father        Deceaesd  . Diabetes Unknown   . Melanoma Brother        Deceased  . COPD Brother   . Healthy Daughter      Social History   Tobacco Use  Smoking Status Former Smoker  . Last attempt to quit: 09/12/1990  . Years since quitting: 27.9  Smokeless Tobacco Never Used    Social History   Substance and Sexual Activity  Alcohol Use Yes  . Alcohol/week: 0.0 standard drinks   Comment: rare     Allergies  Allergen Reactions  . Oxycodone Nausea Only    Has to take nausea medications if taken. Prefer not to take    Current Facility-Administered Medications  Medication Dose Route Frequency Provider Last Rate Last Dose  . aspirin EC tablet 81 mg  81  mg Oral Daily Isaiah Serge, NP   81 mg at 08/15/18 0830  . atorvastatin (LIPITOR) tablet 80 mg  80 mg Oral q1800 Isaiah Serge, NP   80 mg at 08/15/18 1749  . FLUoxetine (PROZAC) capsule 20 mg  20 mg Oral Daily Cecilie Kicks R, NP   20 mg at 08/15/18 0830  . furosemide (LASIX) tablet 20 mg  20 mg Oral BID Isaiah Serge, NP   20 mg at 08/15/18 1749  . insulin aspart (novoLOG) injection 0-5 Units  0-5 Units Subcutaneous QHS Isaiah Serge, NP   2 Units at 08/15/18 2115  . insulin aspart (novoLOG) injection 0-9 Units  0-9 Units Subcutaneous TID WC Isaiah Serge, NP   2 Units at 08/15/18 1750  . Melatonin TABS 3 mg  3 mg Oral QHS PRN Isaiah Serge, NP      . metoprolol succinate (TOPROL-XL) 24 hr tablet 50 mg  50 mg Oral BID Pixie Casino, MD   50 mg at 08/15/18 1749  . multivitamin with minerals tablet 1 tablet  1 tablet Oral Daily Isaiah Serge, NP   1 tablet at 08/15/18 0831  . potassium chloride SA (K-DUR,KLOR-CON) CR tablet 20 mEq  20 mEq Oral Daily Isaiah Serge, NP   20 mEq at 08/15/18 0831  . sodium chloride flush (NS) 0.9 % injection 3 mL  3 mL Intravenous Q12H Isaiah Serge, NP   3 mL at 08/14/18 1610  . tirofiban (AGGRASTAT) infusion 50 mcg/mL 250 mL  0.1 mcg/kg/min Intravenous Continuous [name], MD        Pertinent items are noted in HPI.   Review of Systems:     Cardiac Review of Systems: [Y] = yes  or   [ N ] = no   Chest Pain [ y  ]  Resting SOB [  y ] Exertional SOB  Blue.Reese  ]  Orthopnea Blue.Reese  ]   Pedal Edema [ n  ]    Palpitations [ n ] Syncope  [ n ]   Presyncope [ n  ]   General Review of Systems: [Y] = yes [  ]=no Constitional:  recent weight change [  ];  Wt loss over the last 3 months [   ] anorexia [  ]; fatigue [  ]; nausea [  ]; night sweats [  ]; fever [  ]; or chills [  ];           Eye : blurred vision [  ]; diplopia [   ]; vision changes [  ];  Amaurosis fugax[  ]; Resp: cough [  ];  wheezing[  ];  hemoptysis[  ]; shortness of breath[  ];  paroxysmal nocturnal dyspnea[  ]; dyspnea on exertion[  ]; or orthopnea[  ];  GI:  gallstones[  ], vomiting[  ];  dysphagia[  ]; melena[  ];  hematochezia [  ]; heartburn[  ];   Hx of  Colonoscopy[  ]; GU: kidney stones [  ]; hematuria[  ];   dysuria [  ];  nocturia[  ];  history of     obstruction [  ]; urinary frequency [  ]             Skin: rash, swelling[  ];, hair loss[  ];  peripheral edema[  ];  or itching[  ]; Musculosketetal: myalgias[  ];  joint swelling[  ];  joint erythema[  ];  joint pain[ y ];  back pain[  ];  Heme/Lymph: bruising[  ];  bleeding[  ];  anemia[  ];  Neuro: TIA[  ];  headaches[  ];  stroke[  ];  vertigo[  ];  seizures[  ];   paresthesias[  ];  difficulty walking[  ];  Psych:depression[  ]; anxiety[  ];  Endocrine: diabetes[ y ];  thyroid dysfunction[  ];  Immunizations: Flu up to date [ y]; Pneumococcal up to date Blue.Reese  ];  Other:     PHYSICAL EXAMINATION: BP (!) 113/53 (BP Location: Left Arm)   Pulse 60   Temp 98.8 F (37.1 C) (Oral)   Resp 18   Ht 5\' 2"  (1.575 m)   Wt 107.9 kg   LMP 10/10/2010   SpO2 95%   BMI 43.49 kg/m  General appearance: alert, cooperative and no distress Head: Normocephalic, without obvious abnormality, atraumatic Neck: no adenopathy, no carotid bruit, no JVD, supple, symmetrical, trachea midline and thyroid not enlarged, symmetric, no tenderness/mass/nodules Lymph nodes: Cervical, supraclavicular, and axillary nodes normal. Resp: clear to auscultation bilaterally Back: symmetric, no curvature. ROM normal. No CVA tenderness. Cardio: regular rate and rhythm, S1, S2 normal, no murmur, click, rub or gallop GI: soft, non-tender; bowel sounds normal; no masses,  no organomegaly Extremities: extremities normal, atraumatic, no cyanosis or edema, Homans sign is negative, no sign of DVT and incisions around both knees, right anterior from knee replacement , left from lipoma removes as teen. Neurologic: Grossly normal  Diagnostic Studies  & Laboratory data:     Recent Radiology Findings:   Dg Chest 2 View  Result Date: 08/13/2018 CLINICAL DATA:  Heart attack July 16, 2018. No chest complaints today. EXAM: CHEST - 2 VIEW COMPARISON:  October 05, 2012 FINDINGS: The heart size and mediastinal contours are within normal limits. Both lungs are clear. The visualized skeletal structures are unremarkable. IMPRESSION: No active cardiopulmonary disease. Electronically Signed   By: Dorise Bullion III M.D   On: 08/13/2018 17:26   Vas US Doppler Pre Cabg  Result Date: 08/15/2018 PREOPERATIVE VASCULAR EVALUATION  Indications: Pre CABG. Performing Technologist: Landry Mellow RDMS, RVT  Examination Guidelines: A complete evaluation includes B-mode imaging, spectral  Doppler, color Doppler, and power Doppler as needed of all accessible portions of each vessel. Bilateral testing is considered an integral part of a complete examination. Limited examinations for reoccurring indications may be performed as noted.  Right Carotid Findings: +----------+--------+--------+--------+------------+--------+           PSV cm/sEDV cm/sStenosisDescribe    Comments +----------+--------+--------+--------+------------+--------+ CCA Prox  96      13                                   +----------+--------+--------+--------+------------+--------+ CCA Distal66      12                                   +----------+--------+--------+--------+------------+--------+ ICA Prox  141     44      40-59%  heterogenous         +----------+--------+--------+--------+------------+--------+ ICA Mid   109     29                                   +----------+--------+--------+--------+------------+--------+ ICA Distal83      20                                   +----------+--------+--------+--------+------------+--------+ ECA       73      4                                    +----------+--------+--------+--------+------------+--------+ Portions of  this table do not appear on this page. +----------+--------+-------+----------------+------------+           PSV cm/sEDV cmsDescribe        Arm Pressure +----------+--------+-------+----------------+------------+ Subclavian161            Multiphasic, [id number]          +----------+--------+-------+----------------+------------+ +---------+--------+--+--------+--+---------+ VertebralPSV cm/s60EDV cm/s17Antegrade +---------+--------+--+--------+--+---------+ Left Carotid Findings: +----------+--------+--------+--------+-----------+------------------+           PSV cm/sEDV cm/sStenosisDescribe   Comments           +----------+--------+--------+--------+-----------+------------------+ CCA Prox  113     22                                            +----------+--------+--------+--------+-----------+------------------+ CCA Distal77      22                                            +----------+--------+--------+--------+-----------+------------------+ ICA Prox  177     53      40-59%  homogeneousupper end of range +----------+--------+--------+--------+-----------+------------------+ ICA Mid   126     32                                            +----------+--------+--------+--------+-----------+------------------+ ICA Distal135     31                                            +----------+--------+--------+--------+-----------+------------------+  ECA       149     12                                            +----------+--------+--------+--------+-----------+------------------+ +----------+--------+--------+----------------+------------+ SubclavianPSV cm/sEDV cm/sDescribe        Arm Pressure +----------+--------+--------+----------------+------------+           184             Multiphasic, [id number]          +----------+--------+--------+----------------+------------+ +---------+--------+--+--------+--+---------+ VertebralPSV cm/s78EDV  cm/s17Antegrade +---------+--------+--+--------+--+---------+  ABI Findings: +--------+------------------+-----+---------+--------+ Right   Rt Pressure (mmHg)IndexWaveform Comment  +--------+------------------+-----+---------+--------+ [id number]                    triphasic         +--------+------------------+-----+---------+--------+ ATA     124               0.98 triphasic         +--------+------------------+-----+---------+--------+ PTA     120               0.95 triphasic         +--------+------------------+-----+---------+--------+ +--------+------------------+-----+---------+-------+ Left    Lt Pressure (mmHg)IndexWaveform Comment +--------+------------------+-----+---------+-------+ [id number]                    triphasic        +--------+------------------+-----+---------+-------+ ATA     117               0.93 triphasic        +--------+------------------+-----+---------+-------+ PTA     131               1.04 triphasic        +--------+------------------+-----+---------+-------+                                                 +--------+------------------+-----+---------+-------+  Right Doppler Findings: +--------+--------+-----+---------+--------+ Site    PressureIndexDoppler  Comments +--------+--------+-----+---------+--------+ [id number]          triphasic         +--------+--------+-----+---------+--------+ Radial               triphasic         +--------+--------+-----+---------+--------+ Ulnar                biphasic          +--------+--------+-----+---------+--------+  Left Doppler Findings: +--------+--------+-----+---------+--------+ Site    PressureIndexDoppler  Comments +--------+--------+-----+---------+--------+ [id number]          triphasic         +--------+--------+-----+---------+--------+ Radial               triphasic         +--------+--------+-----+---------+--------+ Ulnar                 triphasic         +--------+--------+-----+---------+--------+  Summary: Right Carotid: Velocities in the right ICA are consistent with a 40-59%                stenosis. Left Carotid: Velocities in the left ICA are consistent with a 40-59% stenosis. Vertebrals: Bilateral vertebral arteries demonstrate antegrade flow. Right ABI: Resting right ankle-brachial index is within normal range. No evidence of significant right  lower extremity arterial disease. Left ABI: Resting left ankle-brachial index is within normal range. No evidence of significant left lower extremity arterial disease. Right Upper Extremity: Doppler waveforms remain within normal limits with right radial compression. Doppler waveforms remain within normal limits with right ulnar compression. Left Upper Extremity: Doppler waveforms decrease >50% with left radial compression. Doppler waveforms decrease >50% with left ulnar compression.  Electronically signed by Quay Burow MD on 08/15/2018 at 4:47:35 PM.    Final      I have independently reviewed the above radiology studies  and reviewed the findings with the patient.   Recent Lab Findings: Lab Results  Component Value Date   WBC 7.9 08/16/2018   HGB 12.1 08/16/2018   HCT 39.9 08/16/2018   PLT 225 08/16/2018   GLUCOSE 128 (H) 08/16/2018   CHOL 220 (H) 05/11/2011   TRIG 576 (H) 05/11/2011   HDL 40 05/11/2011   LDLDIRECT 123 (H) 01/24/2018   [place]  05/11/2011     Comment:       Not calculated due to Triglyceride >400. Suggest ordering Direct LDL (Unit Code: [phone]).   Total Cholesterol/HDL Ratio:CHD Risk                        Coronary Heart Disease Risk Table                                        Men       Women          1/2 Average Risk              3.4        3.3              Average Risk              5.0        4.4           2X Average Risk              9.6        7.1           3X Average Risk             23.4       11.0 Use the calculated Patient Ratio  above and the CHD Risk table  to determine the patient's CHD Risk. ATP III Classification (LDL):       < 100        mg/dL         Optimal      100 - 129     mg/dL         Near or Above Optimal      130 - 159     mg/dL         Borderline High      160 - 189     mg/dL         High       > 190        mg/dL         Very High     ALT 21 08/13/2018   AST 21 08/13/2018   NA 139 08/16/2018   K 4.3 08/16/2018   CL 102 08/16/2018   CREATININE 0.82 08/16/2018   BUN 15 08/16/2018   CO2 28 08/16/2018   TSH  2.136 08/13/2018   INR 0.97 08/13/2018   HGBA1C 6.2 (H) 08/13/2018   CATH: ORDERING PROVIDER   GO, BRIAN MINGTAO EXAM DESCRIPTION   CARDIAC CATHETERIZATION EXAM DATE AND TIME  07/16/2018 08:03 P    PROCEDURE: Emergent cardiac catheterization.  PRIMARY CARE PHYSICIAN: [name], MD.  BRIEF SUMMARY: The patient is a 62 year old female with a history of noninsulin-dependent diabetes, morbid obesity with past evaluation for bariatric surgery, dyslipidemia, vitamin D deficiency, anxiety, who is visiting her daughter and grandchildren in the area and lives in [place]. She presented to [facility] ED with inferior STEMI and elevated blood pressure. She was accepted in the late evening emergently to transfer to the cardiac catheterization lab.  DIAGNOSTIC PROCEDURE: The patient was accepted on emergent transfer from [facility] to the cardiac catheterization lab. She received sedation. Right wrist area was prepped in usual sterile fashion. One percent local lidocaine was applied. Right radial artery was cannulated and a 6-French sheath was placed using a modified Seldinger technique. Intra-arterial verapamil 3 milligrams were given. 10,000 units of intravenous heparin  was administered. All catheters were advanced over a J-wire. A 6-French JR4 guide was utilized to cannulate the right coronary artery. After the interventional procedure, a 6-French TIG catheter  utilized to cannulate  the left main and angiography was performed. A 6-French pigtail catheter was utilized to cross the aortic valve and enter the left ventricle.  Pressures and angiography in RAO view was obtained.  FINDINGS: 1.  Left main: Patent. 2.  LAD: 80% proximal lesion prior to the takeoff of prominent and   tortuous diagonal branches. The remainder of the LAD is tortuous with   minor diffuse disease and extends to the apex, but tapers rapidly. 3.  Left circumflex: Very small vessel that gives rise only to a very   small distal OM. 4.  RCA: Large dominant tortuous vessel with a mid occlusion. The  distal   vessel has an extensive distribution. The primary branch is   posterolateral branch. 5.  Left ventricle with inferobasal wall hypokinesis with overall normal   LV systolic function. LVEDP is 25-30 mmHg. No significant gradient   across the aortic valve.  INTERVENTIONAL PROCEDURE: Intravenous heparin was given to therapeutic dosing. A 6-French JR4 guide was utilized to cannulate the right coronary artery. A run-through guidewire was advanced to distal vessel.  Dilatation and restoration of flow was performed with an Emerge 3.0 x 12 mm balloon. Intra-arterial nitroglycerin was administered. Multiple views were imaged in order to better define the distal bifurcation. IVUS evaluation with a Volcano device was performed. Run-through guidewire was advanced to the PDA branch. A Synergy 4.0 x 38 mm stent was deployed in the distal vessel extending into the proximal posterolateral branch at 14-16 atmospheres.  An overlapping Synergy 4.5 x 24 mm stent was placed proximally. A  run-through guidewire was then utilized to cross the stent back into the PDA branch. Dilatation across the stent into the PDA branch was performed with an Emerge 3.0 x 12 mm balloon. Postdilatation of the proximal to mid stented area was performed with a noncompliant  5.0 x 20 mm balloon at 14-16 atmospheres. Subsequent angiography revealed 0% residual stenosis and TIMI-3 flow. The patient tolerated the procedure well without any immediate complications. Moderate or conscious sedation was administered by an independent trained observer under my supervision with time of 55 minutes. Fluoroscopy time of 11 minutes. DAP equals 83.744 Gy-cm  squared. Air Kerma 1213.510 mGy. Less than 250 milliliters of  Visipaque contrast utilized.  CONCLUSION: Successful stenting of an occluded large dominant RCA in the setting of an inferior STEMI. The patient with a chronic proximal LAD stenosis, which will require revascularization, likely in [place]. Options remain for possible LIMA to LAD versus percutaneous intervention, but this can be accomplished as an outpatient.   Earl Lites, MD  ttt/Job: [id number]/YNW: [id number]/MV: 07/17/2018 11:38 A /DT: 07/17/2018 06:29 P  DAB 2018-07-18 06:50:16.058  Transthoracic Echocardiogram  CPT Code(s): * Echocardiogram Comp with Color Flow Doppler and Spectral Doppler (93306)  Indication: Myocardial Infarction BP:      137/64 HR:      72  Summary:  1. Left ventricle is normal in size and systolic function with mild concentric hypertrophy. No overt wall motion abnormality identified. 2. Mild mitral annular calcification.   Findings     Technical Comments: The study quality is technically difficult, however, Definity used to enhance left ventricular visualization and quality. The study is technically limited due to poor acoustic windows.  The study is technically limited due to patient body habitus.  The study is technically limited due to Respiratory Interference.   Left Ventricle: The left ventricular chamber size is normal. Left ventricle septal thickness is mildly increased. There is normal left ventricular systolic function. The EF is estimated at 60-65%. Abnormal  left ventricular diastolic filling is observed, consistent with impaired LV relaxation(Stage I).   Left Atrium: The left atrial volume index of 24.8 mL/m2 is normal.   Right Ventricle: The right ventricular cavity size is normal. The right ventricular global systolic function is normal.   Right Atrium: The right atrium is normal.   Aortic Valve: The aortic valve is not well visualized. There is no evidence of aortic regurgitation. There is no evidence of aortic stenosis.   Mitral Valve: Evidence of mild mitral annular calcification. There is a trivial amount of mitral regurgitation. There is no evidence of mitral stenosis.   Tricuspid Valve: The tricuspid valve leaflets are morphologically normal. There is trivial tricuspid regurgitation. There is no tricuspid stenosis. The right ventricular systolic pressure is calculated at 62mmHg.   Pulmonic Valve: The pulmonic valve is not well visualized. There is no evidence of pulmonic regurgitation. There is no pulmonic stenosis present.   Pericardium: The pericardium appears normal.   Aorta: The aortic root appears normal.   Venous: The inferior vena cava is dilated.    Contrast: Definity was used to optimize study.    Miscellaneous: No evidence of thrombus, intra-cardiac shunting or vegetation.  Measurements  Chambers Name             Value     Normal Range    IVSd (2D)          1.07 cm    (0.6 - 1.1)    LVIDd (2D)          3.8 cm    -          LVIDs (2D)          2.48 cm    (2.2 - 4)     LVPWd (2D)          1.15 cm    (0.6 - 1.1)    Ao root diameter (2D)    2.5 cm    -          Ascending Ao         2.7 cm    -          Thomas dimension 2D  3.3 cm    -          Thomas ESV SP 4CH (MOD)     45.2 ml    -          Thomas ESV SP 2CH (MOD)     55  ml     -          Thomas ESV BP (MOD)       51.8 ml    -          Thomas ESV BP (MOD) index    24.8 ml/m2  (16 - 28)     BP EF (MOD)         69.9 %    (55 - 80)     LV FS (Teichholz) (2D)    34.7 %    (20 - 80)      Mitral Valve Name             Value     Normal Range    MV E-wave Vmax        0.99 m/sec  -          MV A-wave Vmax        1.23 m/sec  -          MV deceleration time     187 msec   (160 - 240)    MV E:A ratio         0.8 ratio   (1.1 - 1.5)    LV E:e' lateral ratio    13.2 ratio  -          LV E:e' septal ratio     16 ratio   -           Tricuspid/Pulmonic Valves Name             Value     Normal Range    TR Vmax           1.56 m/sec  -          RAP             15 mmHg    (<5)        RVSP             25 mmHg    -           Volumes Name             Value     Normal Range    EF Teichholz (2D)      64.7 %    -          LV EDV SP 4CH (MOD)     91.5 ml    -          LV ESV SP 4CH (MOD)     29.5 ml    -          EF SP 4CH (MOD)       67.8 %    -          LV EDV SP 2CH (MOD)     108 ml    -          LV ESV SP 2CH (MOD)     32.9 ml    -          EF SP 2CH (MOD)       69.5 %    -  LV EDV BP          105 ml    -          LV ESV BP          31.6 ml    -           Electronically signed by: Richardson Landry, MD on 07/17/2018 11:33:32  Other Result Information  Interface, Cvpac Results In - 07/17/2018 11:34 AM EST Patient:      Pamela Thomas, Pamela Thomas    Med Rec#:     [id number]               DOB; Age:     1955/09/23 62y yr.   Proc Date:    07/17/2018               Account#:     [id number]             Height:       158 cm / 62.2 in Accession#:   [id number]          Weight:       111 kg / 244.6 lbs Sex:          F                     BSA:          2.09 Pt. Type:     Inpatient Exam Loc:     Portable  Reading:      Richardson Landry, MD Referring:    Richardson Landry, MD Sonographer:  Christen Butter ______________________________________________________________________  Transthoracic Echocardiogram  CPT Code(s): * Echocardiogram Comp with Color Flow Doppler and Spectral Doppler (93306)  Indication: Myocardial Infarction BP:           137/64 HR:           72  Summary:  1. Left ventricle is normal in size and systolic function with mild concentric hypertrophy.  No overt wall motion abnormality identified. 2. Mild mitral annular calcification.   Findings       Technical Comments: The study quality is technically difficult, however, Definity used to enhance left ventricular visualization and quality.  The study is technically limited due to poor acoustic windows.   The study is technically limited due to patient body habitus.   The study is technically limited due to Respiratory Interference.    Left Ventricle: The left ventricular chamber size is normal.  Left ventricle septal thickness is mildly increased.  There is normal left ventricular systolic function.  The EF is estimated at 60-65%.  Abnormal left ventricular diastolic filling is observed, consistent with impaired LV relaxation(Stage I).    Left Atrium: The left atrial volume index of 24.8 mL/m2 is normal.    Right Ventricle: The right ventricular cavity size is normal.  The right ventricular global systolic function is normal.    Right Atrium: The right atrium is normal.    Aortic Valve: The aortic valve is not well visualized.  There is no evidence of aortic regurgitation.  There is no evidence of aortic stenosis.    Mitral Valve: Evidence of mild mitral annular  calcification.  There is a trivial amount of mitral regurgitation.  There is no evidence of mitral stenosis.    Tricuspid Valve: The tricuspid valve leaflets are morphologically normal.  There is trivial tricuspid regurgitation.  There is no tricuspid stenosis.  The right ventricular systolic pressure is calculated at 68mmHg.  Pulmonic Valve: The pulmonic valve is not well visualized.  There is no evidence of pulmonic regurgitation.  There is no pulmonic stenosis present.    Pericardium: The pericardium appears normal.    Aorta: The aortic root appears normal.    Venous: The inferior vena cava is dilated.     Contrast: Definity was used to optimize study.     Miscellaneous: No evidence of thrombus, intra-cardiac shunting or vegetation.   Measurements  Chambers Name                         Value         Normal Range      IVSd (2D)                    1.07 cm       (0.6 - 1.1)       LVIDd (2D)                   3.8 cm        -                  LVIDs (2D)                   2.48 cm       (2.2 - 4)         LVPWd (2D)                   1.15 cm       (0.6 - 1.1)       Ao root diameter (2D)        2.5 cm        -                  Ascending Ao                 2.7 cm        -                  Thomas dimension 2D              3.3 cm        -                  Thomas ESV SP 4CH (MOD)          45.2 ml       -                  Thomas ESV SP 2CH (MOD)          55 ml         -                  Thomas ESV BP (MOD)              51.8 ml       -                  Thomas ESV BP (MOD) index        24.8 ml/m2    (16 - 28)         BP EF (MOD)                  69.9 %        (55 - 80)  LV FS (Teichholz) (2D)       34.7 %        (20 - 80)          Mitral Valve Name                         Value         Normal Range      MV E-wave Vmax               0.99 m/sec    -                  MV A-wave Vmax               1.23 m/sec    -                  MV deceleration time         187 msec      (160 - 240)       MV E:A  ratio                 0.8 ratio     (1.1 - 1.5)       LV E:e' lateral ratio        13.2 ratio    -                  LV E:e' septal ratio         16 ratio      -                   Tricuspid/Pulmonic Valves Name                         Value         Normal Range      TR Vmax                      1.56 m/sec    -                  RAP                          15 mmHg       (<5)              RVSP                         25 mmHg       -                   Volumes Name                         Value         Normal Range      EF Teichholz (2D)            64.7 %        -                  LV EDV SP 4CH (MOD)          91.5 ml       -  LV ESV SP 4CH (MOD)          29.5 ml       -                  EF SP 4CH (MOD)              67.8 %        -                  LV EDV SP 2CH (MOD)          108 ml        -                  LV ESV SP 2CH (MOD)          32.9 ml       -                  EF SP 2CH (MOD)              69.5 %        -                  LV EDV BP                    105 ml        -                  LV ESV BP                    31.6 ml       -                   Electronically signed by: Richardson Landry, MD on 07/17/2018     Assessment / Plan:   1/ Acute inferior myocardial infarction, treated with stenting but with residual disease involving the LAD diagonal.  Currently approximately 1 month post angioplasty on Effient.  Her cath films were reviewed with Dr. Burt Knack.  The complexity of her LAD diagonal disease distal left main would make stenting difficult and carry increased risk.  I discussed with her proceeding with coronary artery bypass grafting.  Her films were reviewed and options discussed with her and her husband.  She is willing to proceed  The goals risks and alternatives of the planned surgical procedure Procedure(s): CORONARY ARTERY BYPASS GRAFTING (CABG) (N/A) TRANSESOPHAGEAL ECHOCARDIOGRAM (TEE) (N/A)  have been discussed with the patient in detail. The risks of the  procedure including death, infection, stroke, myocardial infarction, bleeding, blood transfusion have all been discussed specifically.  I have quoted Pamela Thomas a 2 % of perioperative mortality and a complication rate as high as 30 %. The patient's questions have been answered.Pamela Thomas is willing  to proceed with the planned procedure.    2/dyslipidemia 3/diabetes with complications of coronary artery disease [name] MD      [address] [place] [address] Office [place] [phone]  08/16/2018 8:47 AM     Patient ID: Pamela Thomas, female   DOB: 01/06/1956, 62 y.o.   MRN: [id number]

## 2018-08-16 NOTE — Progress Notes (Signed)
[place] for tirofiban Indication: effient washout  Allergies  Allergen Reactions  . Oxycodone Nausea Only    Has to take nausea medications if taken. Prefer not to take    Patient Measurements: Height: 5\' 2"  (157.5 cm) Weight: 237 lb 12.8 oz (107.9 kg) IBW/kg (Calculated) : 50.1  Vital Signs: Temp: 98.8 F (37.1 C) (12/05 0354) Temp Source: Oral (12/05 0354) BP: 113/53 (12/05 0354) Pulse Rate: 60 (12/05 0354)  Labs: Recent Labs    08/13/18 1249 08/14/18 0237 08/15/18 0435 08/16/18 0258  HGB 12.3 11.9* 12.5 12.1  HCT 41.1 39.0 40.1 39.9  PLT 269 209 199 225  LABPROT 12.8  --   --   --   INR 0.97  --   --   --   CREATININE 0.90 0.88  --  0.82    Estimated Creatinine Clearance: 81.1 mL/min (by C-G formula based on SCr of 0.82 mg/dL).   Medical History: Past Medical History:  Diagnosis Date  . ALLERGIC RHINITIS, SEASONAL   . Diabetes mellitus without complication ([place])   . DYSLIPIDEMIA   . Dysrhythmia    palpitations-evaluated by Dr Johnsie Cancel 6/12/eccho 6/13  . Glucose intolerance (impaired glucose tolerance)   . History of blood transfusion   . Hypertension   . OBESITY, NOS   . Osteoarthritis    rt knee  . PLANTAR FASCIITIS, BILATERAL   . Vertigo    Assessment: 62 year old female admitted for effient washout, started tirofiban 12/2. Hemoglobin stable, pltc improved this morning. No issues noted.    Planning cabg 12/6  Goal of Therapy:  Monitor platelets by anticoagulation protocol: Yes   Plan:  Tirofiban 78mcg/kg/min Stop in am 0130 CABG 12/6  Erin Hearing PharmD., BCPS Clinical Pharmacist 08/16/2018 8:42 AM

## 2018-08-17 ENCOUNTER — Inpatient Hospital Stay (HOSPITAL_COMMUNITY): Payer: BLUE CROSS/BLUE SHIELD

## 2018-08-17 ENCOUNTER — Inpatient Hospital Stay (HOSPITAL_COMMUNITY): Payer: BLUE CROSS/BLUE SHIELD | Admitting: Certified Registered"

## 2018-08-17 ENCOUNTER — Inpatient Hospital Stay (HOSPITAL_COMMUNITY)
Admission: RE | Disposition: A | Discharge status: Home / Self Care | Payer: Self-pay | Source: Home / Self Care | Attending: Cardiothoracic Surgery

## 2018-08-17 DIAGNOSIS — I2510 Atherosclerotic heart disease of native coronary artery without angina pectoris: Secondary | ICD-10-CM | POA: Diagnosis present

## 2018-08-17 HISTORY — PX: CORONARY ARTERY BYPASS GRAFT: SHX141

## 2018-08-17 HISTORY — PX: TEE WITHOUT CARDIOVERSION: SHX5443

## 2018-08-17 LAB — POCT I-STAT 3, ART BLOOD GAS (G3+)
Acid-Base Excess: 3 mmol/L — ABNORMAL HIGH (ref 0.0–2.0)
Acid-base deficit: 4 mmol/L — ABNORMAL HIGH (ref 0.0–2.0)
Acid-base deficit: 3 mmol/L — ABNORMAL HIGH (ref 0.0–2.0)
Acid-base deficit: 4 mmol/L — ABNORMAL HIGH (ref 0.0–2.0)
Acid-base deficit: 4 mmol/L — ABNORMAL HIGH (ref 0.0–2.0)
Acid-base deficit: 4 mmol/L — ABNORMAL HIGH (ref 0.0–2.0)
Acid-base deficit: 4 mmol/L — ABNORMAL HIGH (ref 0.0–2.0)
Bicarbonate: 27.3 mmol/L (ref 20.0–28.0)
Bicarbonate: 23.5 mmol/L (ref 20.0–28.0)
Bicarbonate: 22.5 mmol/L (ref 20.0–28.0)
Bicarbonate: 24.7 mmol/L (ref 20.0–28.0)
Bicarbonate: 23.5 mmol/L (ref 20.0–28.0)
Bicarbonate: 21.1 mmol/L (ref 20.0–28.0)
Bicarbonate: 23.8 mmol/L (ref 20.0–28.0)
O2 Saturation: 100 %
O2 Saturation: 98 %
O2 Saturation: 99 %
O2 Saturation: 98 %
O2 Saturation: 98 %
O2 Saturation: 98 %
O2 Saturation: 99 %
Patient temperature: 36.4
Patient temperature: 36.4
Patient temperature: 97.6
Patient temperature: 97.3
Patient temperature: 97.7
Patient temperature: 97.7
TCO2: 29 mmol/L (ref 22–32)
TCO2: 25 mmol/L (ref 22–32)
TCO2: 24 mmol/L (ref 22–32)
TCO2: 27 mmol/L (ref 22–32)
TCO2: 25 mmol/L (ref 22–32)
TCO2: 22 mmol/L (ref 22–32)
TCO2: 25 mmol/L (ref 22–32)
pCO2 arterial: 41.5 mmHg (ref 32.0–48.0)
pCO2 arterial: 49.4 mmHg — ABNORMAL HIGH (ref 32.0–48.0)
pCO2 arterial: 39.6 mmHg (ref 32.0–48.0)
pCO2 arterial: 59.6 mmHg — ABNORMAL HIGH (ref 32.0–48.0)
pCO2 arterial: 52.3 mmHg — ABNORMAL HIGH (ref 32.0–48.0)
pCO2 arterial: 38 mmHg (ref 32.0–48.0)
pCO2 arterial: 54 mmHg — ABNORMAL HIGH (ref 32.0–48.0)
pH, Arterial: 7.426 (ref 7.350–7.450)
pH, Arterial: 7.283 — ABNORMAL LOW (ref 7.350–7.450)
pH, Arterial: 7.359 (ref 7.350–7.450)
pH, Arterial: 7.223 — ABNORMAL LOW (ref 7.350–7.450)
pH, Arterial: 7.257 — ABNORMAL LOW (ref 7.350–7.450)
pH, Arterial: 7.35 (ref 7.350–7.450)
pH, Arterial: 7.25 — ABNORMAL LOW (ref 7.350–7.450)
pO2, Arterial: 346 mmHg — ABNORMAL HIGH (ref 83.0–108.0)
pO2, Arterial: 108 mmHg (ref 83.0–108.0)
pO2, Arterial: 121 mmHg — ABNORMAL HIGH (ref 83.0–108.0)
pO2, Arterial: 129 mmHg — ABNORMAL HIGH (ref 83.0–108.0)
pO2, Arterial: 126 mmHg — ABNORMAL HIGH (ref 83.0–108.0)
pO2, Arterial: 100 mmHg (ref 83.0–108.0)
pO2, Arterial: 142 mmHg — ABNORMAL HIGH (ref 83.0–108.0)

## 2018-08-17 LAB — POCT I-STAT, CHEM 8
BUN: 13 mg/dL (ref 8–23)
BUN: 10 mg/dL (ref 8–23)
BUN: 12 mg/dL (ref 8–23)
BUN: 12 mg/dL (ref 8–23)
BUN: 11 mg/dL (ref 8–23)
BUN: 11 mg/dL (ref 8–23)
Calcium, Ion: 1.23 mmol/L (ref 1.15–1.40)
Calcium, Ion: 0.97 mmol/L — ABNORMAL LOW (ref 1.15–1.40)
Calcium, Ion: 1.17 mmol/L (ref 1.15–1.40)
Calcium, Ion: 1.04 mmol/L — ABNORMAL LOW (ref 1.15–1.40)
Calcium, Ion: 1.06 mmol/L — ABNORMAL LOW (ref 1.15–1.40)
Calcium, Ion: 1.16 mmol/L (ref 1.15–1.40)
Chloride: 101 mmol/L (ref 98–111)
Chloride: 100 mmol/L (ref 98–111)
Chloride: 102 mmol/L (ref 98–111)
Chloride: 99 mmol/L (ref 98–111)
Chloride: 103 mmol/L (ref 98–111)
Chloride: 106 mmol/L (ref 98–111)
Creatinine, Ser: 0.7 mg/dL (ref 0.44–1.00)
Creatinine, Ser: 0.6 mg/dL (ref 0.44–1.00)
Creatinine, Ser: 0.7 mg/dL (ref 0.44–1.00)
Creatinine, Ser: 0.6 mg/dL (ref 0.44–1.00)
Creatinine, Ser: 0.6 mg/dL (ref 0.44–1.00)
Creatinine, Ser: 0.7 mg/dL (ref 0.44–1.00)
Glucose, Bld: 164 mg/dL — ABNORMAL HIGH (ref 70–99)
Glucose, Bld: 146 mg/dL — ABNORMAL HIGH (ref 70–99)
Glucose, Bld: 165 mg/dL — ABNORMAL HIGH (ref 70–99)
Glucose, Bld: 167 mg/dL — ABNORMAL HIGH (ref 70–99)
Glucose, Bld: 196 mg/dL — ABNORMAL HIGH (ref 70–99)
Glucose, Bld: 126 mg/dL — ABNORMAL HIGH (ref 70–99)
HCT: 33 % — ABNORMAL LOW (ref 36.0–46.0)
HCT: 24 % — ABNORMAL LOW (ref 36.0–46.0)
HCT: 29 % — ABNORMAL LOW (ref 36.0–46.0)
HCT: 24 % — ABNORMAL LOW (ref 36.0–46.0)
HCT: 25 % — ABNORMAL LOW (ref 36.0–46.0)
HCT: 32 % — ABNORMAL LOW (ref 36.0–46.0)
Hemoglobin: 11.2 g/dL — ABNORMAL LOW (ref 12.0–15.0)
Hemoglobin: 8.2 g/dL — ABNORMAL LOW (ref 12.0–15.0)
Hemoglobin: 9.9 g/dL — ABNORMAL LOW (ref 12.0–15.0)
Hemoglobin: 8.2 g/dL — ABNORMAL LOW (ref 12.0–15.0)
Hemoglobin: 8.5 g/dL — ABNORMAL LOW (ref 12.0–15.0)
Hemoglobin: 10.9 g/dL — ABNORMAL LOW (ref 12.0–15.0)
Potassium: 4 mmol/L (ref 3.5–5.1)
Potassium: 3.7 mmol/L (ref 3.5–5.1)
Potassium: 3.8 mmol/L (ref 3.5–5.1)
Potassium: 4.1 mmol/L (ref 3.5–5.1)
Potassium: 3.7 mmol/L (ref 3.5–5.1)
Potassium: 4.6 mmol/L (ref 3.5–5.1)
Sodium: 139 mmol/L (ref 135–145)
Sodium: 138 mmol/L (ref 135–145)
Sodium: 138 mmol/L (ref 135–145)
Sodium: 134 mmol/L — ABNORMAL LOW (ref 135–145)
Sodium: 139 mmol/L (ref 135–145)
Sodium: 141 mmol/L (ref 135–145)
TCO2: 30 mmol/L (ref 22–32)
TCO2: 27 mmol/L (ref 22–32)
TCO2: 29 mmol/L (ref 22–32)
TCO2: 27 mmol/L (ref 22–32)
TCO2: 27 mmol/L (ref 22–32)
TCO2: 27 mmol/L (ref 22–32)

## 2018-08-17 LAB — BASIC METABOLIC PANEL
Anion gap: 11 (ref 5–15)
BUN: 13 mg/dL (ref 8–23)
CO2: 26 mmol/L (ref 22–32)
Calcium: 9.1 mg/dL (ref 8.9–10.3)
Chloride: 101 mmol/L (ref 98–111)
Creatinine, Ser: 0.96 mg/dL (ref 0.44–1.00)
GFR calc Af Amer: >60 mL/min (ref >60)
GFR calc non Af Amer: >60 mL/min (ref >60)
Glucose, Bld: 159 mg/dL — ABNORMAL HIGH (ref 70–99)
Potassium: 4.6 mmol/L (ref 3.5–5.1)
Sodium: 138 mmol/L (ref 135–145)

## 2018-08-17 LAB — GLUCOSE, CAPILLARY
Glucose-Capillary: 145 mg/dL — ABNORMAL HIGH (ref 70–99)
Glucose-Capillary: 120 mg/dL — ABNORMAL HIGH (ref 70–99)
Glucose-Capillary: 133 mg/dL — ABNORMAL HIGH (ref 70–99)
Glucose-Capillary: 125 mg/dL — ABNORMAL HIGH (ref 70–99)
Glucose-Capillary: 128 mg/dL — ABNORMAL HIGH (ref 70–99)
Glucose-Capillary: 144 mg/dL — ABNORMAL HIGH (ref 70–99)
Glucose-Capillary: 128 mg/dL — ABNORMAL HIGH (ref 70–99)
Glucose-Capillary: 116 mg/dL — ABNORMAL HIGH (ref 70–99)
Glucose-Capillary: 109 mg/dL — ABNORMAL HIGH (ref 70–99)

## 2018-08-17 LAB — CBC
HCT: 41.9 % (ref 36.0–46.0)
HCT: 33.7 % — ABNORMAL LOW (ref 36.0–46.0)
HCT: 34.2 % — ABNORMAL LOW (ref 36.0–46.0)
HEMOGLOBIN: 10.5 g/dL — AB (ref 12.0–15.0)
Hemoglobin: 13.2 g/dL (ref 12.0–15.0)
Hemoglobin: 10.4 g/dL — ABNORMAL LOW (ref 12.0–15.0)
MCH: 28.3 pg (ref 26.0–34.0)
MCH: 28 pg (ref 26.0–34.0)
MCH: 27.9 pg (ref 26.0–34.0)
MCHC: 31.5 g/dL (ref 30.0–36.0)
MCHC: 31.2 g/dL (ref 30.0–36.0)
MCHC: 30.4 g/dL (ref 30.0–36.0)
MCV: 89.9 fL (ref 80.0–100.0)
MCV: 89.9 fL (ref 80.0–100.0)
MCV: 91.7 fL (ref 80.0–100.0)
PLATELETS: 151 10*3/uL (ref 150–400)
Platelets: 221 10*3/uL (ref 150–400)
Platelets: 201 10*3/uL (ref 150–400)
RBC: 4.66 MIL/uL (ref 3.87–5.11)
RBC: 3.75 MIL/uL — ABNORMAL LOW (ref 3.87–5.11)
RBC: 3.73 MIL/uL — ABNORMAL LOW (ref 3.87–5.11)
RDW: 15.1 % (ref 11.5–15.5)
RDW: 14.9 % (ref 11.5–15.5)
RDW: 15.3 % (ref 11.5–15.5)
WBC: 10.1 10*3/uL (ref 4.0–10.5)
WBC: 10.8 10*3/uL — ABNORMAL HIGH (ref 4.0–10.5)
WBC: 13.2 10*3/uL — ABNORMAL HIGH (ref 4.0–10.5)
nRBC: 0 % (ref 0.0–0.2)
nRBC: 0 % (ref 0.0–0.2)
nRBC: 0 % (ref 0.0–0.2)

## 2018-08-17 LAB — POCT I-STAT 4, (NA,K, GLUC, HGB,HCT)
Glucose, Bld: 143 mg/dL — ABNORMAL HIGH (ref 70–99)
HCT: 30 % — ABNORMAL LOW (ref 36.0–46.0)
Hemoglobin: 10.2 g/dL — ABNORMAL LOW (ref 12.0–15.0)
Potassium: 3.6 mmol/L (ref 3.5–5.1)
Sodium: 143 mmol/L (ref 135–145)

## 2018-08-17 LAB — APTT: aPTT: 32 seconds (ref 24–36)

## 2018-08-17 LAB — MAGNESIUM: Magnesium: 2.7 mg/dL — ABNORMAL HIGH (ref 1.7–2.4)

## 2018-08-17 LAB — CREATININE, SERUM
Creatinine, Ser: 0.85 mg/dL (ref 0.44–1.00)
GFR calc Af Amer: >60 mL/min (ref >60)
GFR calc non Af Amer: >60 mL/min (ref >60)

## 2018-08-17 LAB — PREPARE RBC (CROSSMATCH)

## 2018-08-17 LAB — PLATELET COUNT: Platelets: 180 10*3/uL (ref 150–400)

## 2018-08-17 LAB — HEMOGLOBIN AND HEMATOCRIT, BLOOD
HCT: 24.8 % — ABNORMAL LOW (ref 36.0–46.0)
Hemoglobin: 7.8 g/dL — ABNORMAL LOW (ref 12.0–15.0)

## 2018-08-17 LAB — PROTIME-INR
INR: 1.41
PROTHROMBIN TIME: 17.1 s — AB (ref 11.4–15.2)

## 2018-08-17 SURGERY — CORONARY ARTERY BYPASS GRAFTING (CABG)
Anesthesia: General | Site: Chest

## 2018-08-17 MED ORDER — CHLORHEXIDINE GLUCONATE 0.12 % MT SOLN
15.0000 mL | OROMUCOSAL | Status: AC
  Administered 2018-08-17: 15 mL via OROMUCOSAL

## 2018-08-17 MED ORDER — ROCURONIUM BROMIDE 50 MG/5ML IV SOSY
PREFILLED_SYRINGE | INTRAVENOUS | Status: AC
  Filled 2018-08-17: qty 10

## 2018-08-17 MED ORDER — BISACODYL 5 MG PO TBEC
10.0000 mg | DELAYED_RELEASE_TABLET | Freq: Every day | ORAL | Status: DC
  Administered 2018-08-20 – 2018-08-21 (×2): 10 mg via ORAL
  Filled 2018-08-17 (×3): qty 2

## 2018-08-17 MED ORDER — NITROGLYCERIN IN D5W 200-5 MCG/ML-% IV SOLN: 0.0000 ug/min | INTRAVENOUS | Status: DC

## 2018-08-17 MED ORDER — LACTATED RINGERS IV SOLN: INTRAVENOUS | Status: DC

## 2018-08-17 MED ORDER — ASPIRIN EC 325 MG PO TBEC: 325.0000 mg | DELAYED_RELEASE_TABLET | Freq: Every day | ORAL | Status: DC

## 2018-08-17 MED ORDER — OMEGA-3-ACID ETHYL ESTERS 1 G PO CAPS
2.0000 g | ORAL_CAPSULE | Freq: Two times a day (BID) | ORAL | Status: DC
  Administered 2018-08-18 – 2018-08-26 (×16): 2 g via ORAL
  Filled 2018-08-17 (×16): qty 2

## 2018-08-17 MED ORDER — SODIUM CHLORIDE 0.9 % IV SOLN
INTRAVENOUS | Status: DC | PRN
  Administered 2018-08-17: 750 mg via INTRAVENOUS

## 2018-08-17 MED ORDER — 0.9 % SODIUM CHLORIDE (POUR BTL) OPTIME
TOPICAL | Status: DC | PRN
  Administered 2018-08-17: 6000 mL

## 2018-08-17 MED ORDER — MORPHINE SULFATE (PF) 2 MG/ML IV SOLN
1.0000 mg | INTRAVENOUS | Status: DC | PRN
  Administered 2018-08-18: 2 mg via INTRAVENOUS
  Administered 2018-08-19 – 2018-08-20 (×2): 4 mg via INTRAVENOUS
  Filled 2018-08-17: qty 1
  Filled 2018-08-17 (×2): qty 2

## 2018-08-17 MED ORDER — DOCUSATE SODIUM 100 MG PO CAPS
200.0000 mg | ORAL_CAPSULE | Freq: Every day | ORAL | Status: DC
  Administered 2018-08-19 – 2018-08-21 (×3): 200 mg via ORAL
  Filled 2018-08-17 (×4): qty 2

## 2018-08-17 MED ORDER — MAGNESIUM SULFATE 4 GM/100ML IV SOLN
4.0000 g | Freq: Once | INTRAVENOUS | Status: AC
  Administered 2018-08-17: 4 g via INTRAVENOUS
  Filled 2018-08-17: qty 100

## 2018-08-17 MED ORDER — ARTIFICIAL TEARS OPHTHALMIC OINT
TOPICAL_OINTMENT | OPHTHALMIC | Status: AC
  Filled 2018-08-17: qty 3.5

## 2018-08-17 MED ORDER — SODIUM CHLORIDE 0.9 % IV SOLN: 250.0000 mL | INTRAVENOUS | Status: DC

## 2018-08-17 MED ORDER — PHENYLEPHRINE HCL 10 MG/ML IJ SOLN
INTRAMUSCULAR | Status: DC | PRN
  Administered 2018-08-17 (×2): 80 ug via INTRAVENOUS
  Administered 2018-08-17: 40 ug via INTRAVENOUS

## 2018-08-17 MED ORDER — INSULIN REGULAR(HUMAN) IN NACL 100-0.9 UT/100ML-% IV SOLN
INTRAVENOUS | Status: DC
  Administered 2018-08-18: 3.4 [IU]/h via INTRAVENOUS
  Filled 2018-08-17: qty 100

## 2018-08-17 MED ORDER — ALBUMIN HUMAN 5 % IV SOLN
250.0000 mL | INTRAVENOUS | Status: AC | PRN
  Administered 2018-08-17 – 2018-08-18 (×4): 12.5 g via INTRAVENOUS
  Filled 2018-08-17 (×2): qty 250

## 2018-08-17 MED ORDER — SODIUM CHLORIDE 0.9 % IV SOLN
1.5000 g | Freq: Two times a day (BID) | INTRAVENOUS | Status: AC
  Administered 2018-08-17 – 2018-08-19 (×4): 1.5 g via INTRAVENOUS
  Filled 2018-08-17 (×5): qty 1.5

## 2018-08-17 MED ORDER — HEPARIN SODIUM (PORCINE) 1000 UNIT/ML IJ SOLN
INTRAMUSCULAR | Status: DC | PRN
  Administered 2018-08-17: 32000 [IU] via INTRAVENOUS

## 2018-08-17 MED ORDER — GLYCOPYRROLATE 0.2 MG/ML IJ SOLN
INTRAMUSCULAR | Status: DC | PRN
  Administered 2018-08-17: 0.2 mg via INTRAVENOUS

## 2018-08-17 MED ORDER — ONDANSETRON HCL 4 MG/2ML IJ SOLN
INTRAMUSCULAR | Status: AC
  Filled 2018-08-17: qty 2

## 2018-08-17 MED ORDER — SODIUM CHLORIDE 0.45 % IV SOLN: INTRAVENOUS | Status: DC | PRN

## 2018-08-17 MED ORDER — LACTATED RINGERS IV SOLN
INTRAVENOUS | Status: DC | PRN
  Administered 2018-08-17: 08:00:00 via INTRAVENOUS

## 2018-08-17 MED ORDER — EPHEDRINE SULFATE 50 MG/ML IJ SOLN
INTRAMUSCULAR | Status: DC | PRN
  Administered 2018-08-17 (×5): 10 mg via INTRAVENOUS

## 2018-08-17 MED ORDER — SODIUM CHLORIDE (PF) 0.9 % IJ SOLN
INTRAMUSCULAR | Status: AC
  Filled 2018-08-17: qty 10

## 2018-08-17 MED ORDER — VANCOMYCIN HCL IN DEXTROSE 1-5 GM/200ML-% IV SOLN
1000.0000 mg | Freq: Once | INTRAVENOUS | Status: AC
  Administered 2018-08-17: 1000 mg via INTRAVENOUS

## 2018-08-17 MED ORDER — PROPOFOL 10 MG/ML IV BOLUS
INTRAVENOUS | Status: AC
  Filled 2018-08-17: qty 20

## 2018-08-17 MED ORDER — TRAMADOL HCL 50 MG PO TABS
50.0000 mg | ORAL_TABLET | ORAL | Status: DC | PRN
  Administered 2018-08-18 – 2018-08-19 (×3): 100 mg via ORAL
  Filled 2018-08-17 (×3): qty 2

## 2018-08-17 MED ORDER — PROTAMINE SULFATE 10 MG/ML IV SOLN
INTRAVENOUS | Status: DC | PRN
  Administered 2018-08-17: 280 mg via INTRAVENOUS
  Administered 2018-08-17: 20 mg via INTRAVENOUS

## 2018-08-17 MED ORDER — ALBUMIN HUMAN 5 % IV SOLN
INTRAVENOUS | Status: DC | PRN
  Administered 2018-08-17 (×2): via INTRAVENOUS

## 2018-08-17 MED ORDER — SODIUM CHLORIDE (PF) 0.9 % IJ SOLN
OROMUCOSAL | Status: DC | PRN
  Administered 2018-08-17 (×3): 4 mL via TOPICAL

## 2018-08-17 MED ORDER — SODIUM CHLORIDE 0.9 % IV SOLN
INTRAVENOUS | Status: DC
  Administered 2018-08-17: 13:00:00 via INTRAVENOUS

## 2018-08-17 MED ORDER — ACETAMINOPHEN 160 MG/5ML PO SOLN
1000.0000 mg | Freq: Four times a day (QID) | ORAL | Status: AC
  Administered 2018-08-17: 1000 mg
  Filled 2018-08-17: qty 40.6

## 2018-08-17 MED ORDER — BISACODYL 10 MG RE SUPP: 10.0000 mg | Freq: Every day | RECTAL | Status: DC

## 2018-08-17 MED ORDER — CHLORHEXIDINE GLUCONATE 0.12% ORAL RINSE (MEDLINE KIT)
15.0000 mL | Freq: Two times a day (BID) | OROMUCOSAL | Status: DC
  Administered 2018-08-17: 15 mL via OROMUCOSAL

## 2018-08-17 MED ORDER — PROTAMINE SULFATE 10 MG/ML IV SOLN
INTRAVENOUS | Status: AC
  Filled 2018-08-17: qty 25

## 2018-08-17 MED ORDER — ONDANSETRON HCL 4 MG/2ML IJ SOLN
4.0000 mg | Freq: Four times a day (QID) | INTRAMUSCULAR | Status: DC | PRN
  Administered 2018-08-18 – 2018-08-20 (×4): 4 mg via INTRAVENOUS
  Filled 2018-08-17 (×4): qty 2

## 2018-08-17 MED ORDER — TRANEXAMIC ACID 1000 MG/10ML IV SOLN
1.5000 mg/kg/h | INTRAVENOUS | Status: DC
  Filled 2018-08-17: qty 25

## 2018-08-17 MED ORDER — ONDANSETRON HCL 4 MG/2ML IJ SOLN
INTRAMUSCULAR | Status: DC | PRN
  Administered 2018-08-17: 4 mg via INTRAVENOUS

## 2018-08-17 MED ORDER — VECURONIUM BROMIDE 10 MG IV SOLR
INTRAVENOUS | Status: DC | PRN
  Administered 2018-08-17 (×2): 5 mg via INTRAVENOUS

## 2018-08-17 MED ORDER — PANTOPRAZOLE SODIUM 40 MG PO TBEC
40.0000 mg | DELAYED_RELEASE_TABLET | Freq: Every day | ORAL | Status: DC
  Administered 2018-08-19 – 2018-08-22 (×4): 40 mg via ORAL
  Filled 2018-08-17 (×4): qty 1

## 2018-08-17 MED ORDER — METOPROLOL TARTRATE 5 MG/5ML IV SOLN
2.5000 mg | INTRAVENOUS | Status: DC | PRN
  Administered 2018-08-21: 2.5 mg via INTRAVENOUS
  Filled 2018-08-17: qty 5

## 2018-08-17 MED ORDER — VECURONIUM BROMIDE 10 MG IV SOLR
INTRAVENOUS | Status: AC
  Filled 2018-08-17: qty 10

## 2018-08-17 MED ORDER — FAMOTIDINE IN NACL 20-0.9 MG/50ML-% IV SOLN
20.0000 mg | Freq: Two times a day (BID) | INTRAVENOUS | Status: AC
  Administered 2018-08-17 (×2): 20 mg via INTRAVENOUS
  Filled 2018-08-17: qty 50

## 2018-08-17 MED ORDER — MIDAZOLAM HCL 5 MG/5ML IJ SOLN
INTRAMUSCULAR | Status: DC | PRN
  Administered 2018-08-17: 3 mg via INTRAVENOUS
  Administered 2018-08-17 (×2): 2 mg via INTRAVENOUS
  Administered 2018-08-17: 3 mg via INTRAVENOUS

## 2018-08-17 MED ORDER — LACTATED RINGERS IV SOLN: 500.0000 mL | Freq: Once | INTRAVENOUS | Status: DC | PRN

## 2018-08-17 MED ORDER — HEMOSTATIC AGENTS (NO CHARGE) OPTIME
TOPICAL | Status: DC | PRN
  Administered 2018-08-17: 1 via TOPICAL

## 2018-08-17 MED ORDER — LACTATED RINGERS IV SOLN
INTRAVENOUS | Status: DC | PRN
  Administered 2018-08-17: 07:00:00 via INTRAVENOUS

## 2018-08-17 MED ORDER — ACETAMINOPHEN 500 MG PO TABS
1000.0000 mg | ORAL_TABLET | Freq: Four times a day (QID) | ORAL | Status: AC
  Administered 2018-08-18 – 2018-08-22 (×13): 1000 mg via ORAL
  Filled 2018-08-17 (×13): qty 2

## 2018-08-17 MED ORDER — FENTANYL CITRATE (PF) 250 MCG/5ML IJ SOLN
INTRAMUSCULAR | Status: AC
  Filled 2018-08-17: qty 10

## 2018-08-17 MED ORDER — FENTANYL CITRATE (PF) 250 MCG/5ML IJ SOLN
INTRAMUSCULAR | Status: AC
  Filled 2018-08-17: qty 5

## 2018-08-17 MED ORDER — DEXAMETHASONE SODIUM PHOSPHATE 10 MG/ML IJ SOLN
INTRAMUSCULAR | Status: AC
  Filled 2018-08-17: qty 1

## 2018-08-17 MED ORDER — ASPIRIN 81 MG PO CHEW: 324.0000 mg | CHEWABLE_TABLET | Freq: Every day | ORAL | Status: DC

## 2018-08-17 MED ORDER — FENTANYL CITRATE (PF) 250 MCG/5ML IJ SOLN
INTRAMUSCULAR | Status: DC | PRN
  Administered 2018-08-17 (×4): 250 ug via INTRAVENOUS
  Administered 2018-08-17: 50 ug via INTRAVENOUS
  Administered 2018-08-17 (×3): 250 ug via INTRAVENOUS
  Administered 2018-08-17: 200 ug via INTRAVENOUS

## 2018-08-17 MED ORDER — PROPOFOL 10 MG/ML IV BOLUS
INTRAVENOUS | Status: DC | PRN
  Administered 2018-08-17: 150 mg via INTRAVENOUS

## 2018-08-17 MED ORDER — PROTAMINE SULFATE 10 MG/ML IV SOLN
INTRAVENOUS | Status: AC
  Filled 2018-08-17: qty 5

## 2018-08-17 MED ORDER — LIDOCAINE 2% (20 MG/ML) 5 ML SYRINGE
INTRAMUSCULAR | Status: AC
  Filled 2018-08-17: qty 5

## 2018-08-17 MED ORDER — SODIUM CHLORIDE 0.9% FLUSH
3.0000 mL | INTRAVENOUS | Status: DC | PRN
  Administered 2018-08-18: 3 mL via INTRAVENOUS
  Filled 2018-08-17: qty 3

## 2018-08-17 MED ORDER — ICOSAPENT ETHYL 1 G PO CAPS: 2.0000 g | ORAL_CAPSULE | Freq: Two times a day (BID) | ORAL | Status: DC

## 2018-08-17 MED ORDER — MELATONIN 3 MG PO TABS
3.0000 mg | ORAL_TABLET | Freq: Every evening | ORAL | Status: DC | PRN
  Administered 2018-08-20 – 2018-08-24 (×2): 3 mg via ORAL
  Filled 2018-08-17 (×4): qty 1

## 2018-08-17 MED ORDER — NITROGLYCERIN 0.2 MG/ML ON CALL CATH LAB
INTRAVENOUS | Status: DC | PRN
  Administered 2018-08-17: 40 ug via INTRAVENOUS
  Administered 2018-08-17 (×3): 20 ug via INTRAVENOUS

## 2018-08-17 MED ORDER — ROCURONIUM BROMIDE 10 MG/ML (PF) SYRINGE
PREFILLED_SYRINGE | INTRAVENOUS | Status: DC | PRN
  Administered 2018-08-17 (×3): 50 mg via INTRAVENOUS

## 2018-08-17 MED ORDER — INSULIN REGULAR BOLUS VIA INFUSION
0.0000 [IU] | Freq: Three times a day (TID) | INTRAVENOUS | Status: DC
  Filled 2018-08-17: qty 10

## 2018-08-17 MED ORDER — MIDAZOLAM HCL (PF) 10 MG/2ML IJ SOLN
INTRAMUSCULAR | Status: AC
  Filled 2018-08-17: qty 2

## 2018-08-17 MED ORDER — LACTATED RINGERS IV SOLN
INTRAVENOUS | Status: DC | PRN
  Administered 2018-08-17 (×3): via INTRAVENOUS

## 2018-08-17 MED ORDER — ARTIFICIAL TEARS OPHTHALMIC OINT
TOPICAL_OINTMENT | OPHTHALMIC | Status: DC | PRN
  Administered 2018-08-17: 1 via OPHTHALMIC

## 2018-08-17 MED ORDER — MIDAZOLAM HCL 2 MG/2ML IJ SOLN: 2.0000 mg | INTRAMUSCULAR | Status: DC | PRN

## 2018-08-17 MED ORDER — PHENYLEPHRINE HCL-NACL 20-0.9 MG/250ML-% IV SOLN
0.0000 ug/min | INTRAVENOUS | Status: DC
  Administered 2018-08-17: 20 ug/min via INTRAVENOUS
  Filled 2018-08-17: qty 250

## 2018-08-17 MED ORDER — SODIUM CHLORIDE 0.9% FLUSH
3.0000 mL | Freq: Two times a day (BID) | INTRAVENOUS | Status: DC
  Administered 2018-08-18 – 2018-08-20 (×5): 3 mL via INTRAVENOUS

## 2018-08-17 MED ORDER — ORAL CARE MOUTH RINSE
15.0000 mL | OROMUCOSAL | Status: DC
  Administered 2018-08-17 – 2018-08-18 (×6): 15 mL via OROMUCOSAL

## 2018-08-17 MED ORDER — FENTANYL CITRATE (PF) 250 MCG/5ML IJ SOLN
INTRAMUSCULAR | Status: AC
  Filled 2018-08-17: qty 25

## 2018-08-17 MED ORDER — METOPROLOL TARTRATE 25 MG/10 ML ORAL SUSPENSION: 12.5000 mg | Freq: Two times a day (BID) | ORAL | Status: DC

## 2018-08-17 MED ORDER — DEXMEDETOMIDINE HCL IN NACL 200 MCG/50ML IV SOLN: 0.0000 ug/kg/h | INTRAVENOUS | Status: DC

## 2018-08-17 MED ORDER — ACETAMINOPHEN 650 MG RE SUPP
650.0000 mg | Freq: Once | RECTAL | Status: AC
  Administered 2018-08-17: 650 mg via RECTAL

## 2018-08-17 MED ORDER — METOPROLOL TARTRATE 12.5 MG HALF TABLET
12.5000 mg | ORAL_TABLET | Freq: Two times a day (BID) | ORAL | Status: DC
  Administered 2018-08-18 (×2): 12.5 mg via ORAL
  Filled 2018-08-17 (×2): qty 1

## 2018-08-17 MED ORDER — ACETAMINOPHEN 160 MG/5ML PO SOLN: 650.0000 mg | Freq: Once | ORAL | Status: AC

## 2018-08-17 MED ORDER — POTASSIUM CHLORIDE 10 MEQ/50ML IV SOLN
10.0000 meq | INTRAVENOUS | Status: AC
  Administered 2018-08-17 (×3): 10 meq via INTRAVENOUS

## 2018-08-17 MED ORDER — DEXAMETHASONE SODIUM PHOSPHATE 10 MG/ML IJ SOLN
INTRAMUSCULAR | Status: DC | PRN
  Administered 2018-08-17: 4 mg via INTRAVENOUS

## 2018-08-17 SURGICAL SUPPLY — 74 items
BAG DECANTER FOR FLEXI CONT (MISCELLANEOUS) ×3 IMPLANT
BANDAGE ACE 4X5 VEL STRL LF (GAUZE/BANDAGES/DRESSINGS) ×3 IMPLANT
BANDAGE ACE 6X5 VEL STRL LF (GAUZE/BANDAGES/DRESSINGS) ×3 IMPLANT
BLADE STERNUM SYSTEM 6 (BLADE) ×3 IMPLANT
BLADE SURG 11 STRL SS (BLADE) ×1 IMPLANT
BNDG GAUZE ELAST 4 BULKY (GAUZE/BANDAGES/DRESSINGS) ×3 IMPLANT
CANISTER SUCT 3000ML PPV (MISCELLANEOUS) ×3 IMPLANT
CANISTER WOUNDNEG PRESSURE 500 (CANNISTER) ×1 IMPLANT
CATH CPB KIT GERHARDT (MISCELLANEOUS) ×3 IMPLANT
CATH THORACIC 28FR (CATHETERS) ×3 IMPLANT
CLIP VESOCCLUDE SM WIDE 24/CT (CLIP) ×1 IMPLANT
COVER WAND RF STERILE (DRAPES) ×3 IMPLANT
CRADLE DONUT ADULT HEAD (MISCELLANEOUS) ×3 IMPLANT
DRAIN CHANNEL 28F RND 3/8 FF (WOUND CARE) ×3 IMPLANT
DRAPE CARDIOVASCULAR INCISE (DRAPES) ×3
DRAPE SLUSH/WARMER DISC (DRAPES) ×3 IMPLANT
DRAPE SRG 135X102X78XABS (DRAPES) ×2 IMPLANT
DRSG AQUACEL AG ADV 3.5X14 (GAUZE/BANDAGES/DRESSINGS) ×3 IMPLANT
ELECT BLADE 4.0 EZ CLEAN MEGAD (MISCELLANEOUS) ×3
ELECT REM PT RETURN 9FT ADLT (ELECTROSURGICAL) ×6
ELECTRODE BLDE 4.0 EZ CLN MEGD (MISCELLANEOUS) ×2 IMPLANT
ELECTRODE REM PT RTRN 9FT ADLT (ELECTROSURGICAL) ×4 IMPLANT
FELT TEFLON 1X6 (MISCELLANEOUS) ×6 IMPLANT
GAUZE SPONGE 4X4 12PLY STRL (GAUZE/BANDAGES/DRESSINGS) ×6 IMPLANT
GAUZE SPONGE 4X4 12PLY STRL LF (GAUZE/BANDAGES/DRESSINGS) ×2 IMPLANT
GLOVE BIO SURGEON STRL SZ 6.5 (GLOVE) ×15 IMPLANT
GLOVE BIOGEL PI IND STRL 6 (GLOVE) IMPLANT
GLOVE BIOGEL PI INDICATOR 6 (GLOVE) ×2
GOWN STRL REUS W/ TWL LRG LVL3 (GOWN DISPOSABLE) ×8 IMPLANT
GOWN STRL REUS W/TWL LRG LVL3 (GOWN DISPOSABLE) ×24
HEMOSTAT POWDER KIT SURGIFOAM (HEMOSTASIS) ×9 IMPLANT
HEMOSTAT POWDER SURGIFOAM 1G (HEMOSTASIS) ×9 IMPLANT
HEMOSTAT SURGICEL 2X14 (HEMOSTASIS) ×3 IMPLANT
KIT BASIN OR (CUSTOM PROCEDURE TRAY) ×3 IMPLANT
KIT CATH SUCT 8FR (CATHETERS) ×3 IMPLANT
KIT SUCTION CATH 14FR (SUCTIONS) ×6 IMPLANT
KIT TURNOVER KIT B (KITS) ×3 IMPLANT
KIT VASOVIEW HEMOPRO 2 VH 4000 (KITS) ×3 IMPLANT
LEAD PACING MYOCARDI (MISCELLANEOUS) ×3 IMPLANT
MARKER GRAFT CORONARY BYPASS (MISCELLANEOUS) ×9 IMPLANT
NS IRRIG 1000ML POUR BTL (IV SOLUTION) ×15 IMPLANT
PACK E OPEN HEART (SUTURE) ×3 IMPLANT
PACK OPEN HEART (CUSTOM PROCEDURE TRAY) ×3 IMPLANT
PAD ARMBOARD 7.5X6 YLW CONV (MISCELLANEOUS) ×6 IMPLANT
PAD ELECT DEFIB RADIOL ZOLL (MISCELLANEOUS) ×3 IMPLANT
PENCIL BUTTON HOLSTER BLD 10FT (ELECTRODE) ×3 IMPLANT
PUNCH AORTIC ROTATE  4.5MM 8IN (MISCELLANEOUS) ×1 IMPLANT
SET CARDIOPLEGIA MPS 5001102 (MISCELLANEOUS) ×1 IMPLANT
SUT BONE WAX W31G (SUTURE) ×3 IMPLANT
SUT ETHILON 3 0 FSL (SUTURE) ×1 IMPLANT
SUT MNCRL AB 4-0 PS2 18 (SUTURE) ×1 IMPLANT
SUT PROLENE 3 0 SH DA (SUTURE) ×1 IMPLANT
SUT PROLENE 3 0 SH1 36 (SUTURE) ×3 IMPLANT
SUT PROLENE 4 0 TF (SUTURE) ×6 IMPLANT
SUT PROLENE 6 0 CC (SUTURE) ×6 IMPLANT
SUT PROLENE 7 0 BV1 MDA (SUTURE) ×3 IMPLANT
SUT PROLENE 8 0 BV175 6 (SUTURE) ×3 IMPLANT
SUT STEEL 6MS V (SUTURE) ×3 IMPLANT
SUT STEEL SZ 6 DBL 3X14 BALL (SUTURE) ×3 IMPLANT
SUT VIC AB 1 CTX 18 (SUTURE) ×6 IMPLANT
SUT VIC AB 2-0 CT1 27 (SUTURE) ×3
SUT VIC AB 2-0 CT1 TAPERPNT 27 (SUTURE) IMPLANT
SUT VIC AB 2-0 CTX 36 (SUTURE) ×1 IMPLANT
SYSTEM SAHARA CHEST DRAIN ATS (WOUND CARE) ×3 IMPLANT
TAPE CLOTH SURG 4X10 WHT LF (GAUZE/BANDAGES/DRESSINGS) ×1 IMPLANT
TAPE PAPER 2X10 WHT MICROPORE (GAUZE/BANDAGES/DRESSINGS) ×1 IMPLANT
TOWEL GREEN STERILE (TOWEL DISPOSABLE) ×3 IMPLANT
TOWEL GREEN STERILE FF (TOWEL DISPOSABLE) ×3 IMPLANT
TRAY FOLEY SLVR 16FR TEMP STAT (SET/KITS/TRAYS/PACK) ×3 IMPLANT
TUBE SUCT INTRACARD DLP 20F (MISCELLANEOUS) ×1 IMPLANT
TUBING INSUFFLATION (TUBING) ×3 IMPLANT
UNDERPAD 30X30 (UNDERPADS AND DIAPERS) ×3 IMPLANT
WATER STERILE IRR 1000ML POUR (IV SOLUTION) ×6 IMPLANT
YANKAUER SUCT BULB TIP NO VENT (SUCTIONS) ×1 IMPLANT

## 2018-08-17 NOTE — Progress Notes (Signed)
SICU wean protocol initiated.

## 2018-08-17 NOTE — Anesthesia Procedure Notes (Deleted)
Performed by: Jearld Pies, CRNA

## 2018-08-17 NOTE — Progress Notes (Signed)
Swan waveform not reading accurately.  Gerhardt MD at bedside, and after trying to reposition, ordered to pull swan. Swan pulled with no issues. Will continue to monitor.

## 2018-08-17 NOTE — Anesthesia Procedure Notes (Signed)
Central Venous Catheter Insertion Performed by: Effie Berkshire, MD, anesthesiologist Start/End12/02/2018 7:00 AM, 08/17/2018 7:10 AM Patient location: Pre-op. Preanesthetic checklist: patient identified, IV checked, site marked, risks and benefits discussed, surgical consent, monitors and equipment checked, pre-op evaluation, timeout performed and anesthesia consent Lidocaine 1% used for infiltration and patient sedated Hand hygiene performed  and maximum sterile barriers used  Catheter size: 9 Fr MAC introducer Procedure performed using ultrasound guided technique. Ultrasound Notes:anatomy identified, needle tip was noted to be adjacent to the nerve/plexus identified, no ultrasound evidence of intravascular and/or intraneural injection and image(s) printed for medical record Attempts: 1 Following insertion, line sutured and dressing applied. Post procedure assessment: blood return through all ports, free fluid flow and no air  Patient tolerated the procedure well with no immediate complications.

## 2018-08-17 NOTE — Anesthesia Procedure Notes (Signed)
Central Venous Catheter Insertion Performed by: Effie Berkshire, MD, anesthesiologist Start/End12/02/2018 7:08 AM, 08/17/2018 7:10 AM Patient location: Pre-op. Preanesthetic checklist: patient identified, IV checked, site marked, risks and benefits discussed, surgical consent, monitors and equipment checked, pre-op evaluation, timeout performed and anesthesia consent Hand hygiene performed  and maximum sterile barriers used  PA cath was placed.Swan type:thermodilution Procedure performed without using ultrasound guided technique. Attempts: 1 Patient tolerated the procedure well with no immediate complications.

## 2018-08-17 NOTE — Progress Notes (Signed)
      [address]       [place] [address]             [phone]      S/p CABG x 2  Awake and following commands  BP 116/60   Pulse 90   Temp 97.7 F (36.5 C)   Resp 13   Ht 5\' 2"  (1.575 m)   Wt 107.9 kg   LMP 10/10/2010   SpO2 100%   BMI 43.49 kg/m   Intake/Output Summary (Last 24 hours) at 08/17/2018 1745 Last data filed at 08/17/2018 1700 Gross per 24 hour  Intake 6072.56 ml  Output 1725 ml  Net 4347.56 ml   Failed ist attempt to wean- will try again soon  Remo Lipps C. Roxan Hockey, MD [facility] [phone]

## 2018-08-17 NOTE — Anesthesia Procedure Notes (Signed)
Arterial Line Insertion Start/End12/02/2018 7:00 AM, 08/17/2018 7:15 AM Performed by: Jearld Pies, CRNA, CRNA  Patient location: Pre-op. Preanesthetic checklist: patient identified, IV checked, site marked, risks and benefits discussed, surgical consent, monitors and equipment checked, pre-op evaluation, timeout performed and anesthesia consent Lidocaine 1% used for infiltration and patient sedated Left was placed Catheter size: 20 G Hand hygiene performed , maximum sterile barriers used  and Seldinger technique used Allen's test indicative of satisfactory collateral circulation Attempts: 2 Procedure performed without using ultrasound guided technique. Following insertion, dressing applied and Biopatch. Post procedure assessment: normal  Patient tolerated the procedure well with no immediate complications.

## 2018-08-17 NOTE — Progress Notes (Signed)
Re-initiated SICU wean protocol.  Pt appears more awake now.  VSS.

## 2018-08-17 NOTE — Anesthesia Procedure Notes (Signed)
Procedure Name: Intubation Date/Time: 08/17/2018 7:59 AM Performed by: Jearld Pies, CRNA Pre-anesthesia Checklist: Patient identified, Emergency Drugs available, Suction available and Patient being monitored Patient Re-evaluated:Patient Re-evaluated prior to induction Oxygen Delivery Method: Circle System Utilized Preoxygenation: Pre-oxygenation with 100% oxygen Induction Type: IV induction Ventilation: Mask ventilation without difficulty Laryngoscope Size: Mac and 3 Grade View: Grade I Tube type: Oral Tube size: 8.0 mm Number of attempts: 1 Airway Equipment and Method: Stylet and Oral airway Placement Confirmation: ETT inserted through vocal cords under direct vision,  positive ETCO2 and breath sounds checked- equal and bilateral Secured at: 21 cm Tube secured with: Tape Dental Injury: Teeth and Oropharynx as per pre-operative assessment

## 2018-08-17 NOTE — Anesthesia Preprocedure Evaluation (Addendum)
Anesthesia Evaluation  Patient identified by MRN, date of birth, ID band Patient awake    Reviewed: Allergy & Precautions, H&P , NPO status , Patient's Chart, lab work & pertinent test results  Airway Mallampati: II  TM Distance: <3 FB Neck ROM: full    Dental  (+) Edentulous Upper, Dental Advisory Given   Pulmonary former smoker,    breath sounds clear to auscultation       Cardiovascular hypertension, + CAD and + Past MI  + dysrhythmias  Rhythm:Regular Rate:Bradycardia     Neuro/Psych  Neuromuscular disease    GI/Hepatic   Endo/Other  diabetes, Type 2Morbid obesity  Renal/GU      Musculoskeletal  (+) Arthritis ,   Abdominal (+) + obese,   Peds  Hematology   Anesthesia Other Findings   Reproductive/Obstetrics                            Anesthesia Physical Anesthesia Plan  ASA: III  Anesthesia Plan: General   Post-op Pain Management:    Induction: Intravenous  PONV Risk Score and Plan: 3 and Ondansetron, Dexamethasone, Midazolam and Treatment may vary due to age or medical condition  Airway Management Planned: Oral ETT  Additional Equipment: Arterial line, CVP, PA Cath, TEE and Ultrasound Guidance Line Placement  Intra-op Plan:   Post-operative Plan: Post-operative intubation/ventilation  Informed Consent: I have reviewed the patients History and Physical, chart, labs and discussed the procedure including the risks, benefits and alternatives for the proposed anesthesia with the patient or authorized representative who has indicated his/her understanding and acceptance.     Plan Discussed with: CRNA, Anesthesiologist and Surgeon  Anesthesia Plan Comments:         Anesthesia Quick Evaluation

## 2018-08-17 NOTE — Brief Op Note (Addendum)
      [address]       [place] [address]             [phone]     08/17/2018  10:57 AM  PATIENT:  Pamela Thomas  62 y.o. female  PRE-OPERATIVE DIAGNOSIS:  CAD  POST-OPERATIVE DIAGNOSIS:  CAD   PROCEDURE:  TRANSESOPHAGEAL ECHOCARDIOGRAM (TEE), MEDIAN STERNOTOMY For CORONARY ARTERY BYPASS GRAFTING (CABG)  (LIMA to LAD, SVG to RAMUS INTERMEDIATE) using LEFT INTERNAL MAMMARY ARTERY and RIGHT THIGH GREATER SAPHENOUS VEIN HARVESTED ENDOSCOPICALLY  SURGEON:  Surgeon(s) and Role:    [name], MD - Primary  PHYSICIAN ASSISTANT: Lars Pinks PA-C  ASSISTANTS: Marney Setting RNFA  ANESTHESIA:   general  EBL:  As per anesthesia and perfusion record  DRAINS: Chest tubes placed in the mediastinal and pleural spaces   COUNTS CORRECT:  YES  DICTATION: .Dragon Dictation  PLAN OF CARE: Admit to inpatient   PATIENT DISPOSITION:  ICU - intubated and hemodynamically stable.   Delay start of Pharmacological VTE agent (>24hrs) due to surgical blood loss or risk of bleeding: yes  BASELINE WEIGHT: 107.9  kg

## 2018-08-17 NOTE — Progress Notes (Signed)
[address]       [place] [address]             [phone]     Pre Procedure note for inpatients:   Pamela Thomas has been scheduled for Procedure(s): CORONARY ARTERY BYPASS GRAFTING (CABG) (N/A) TRANSESOPHAGEAL ECHOCARDIOGRAM (TEE) (N/A) today. The various methods of treatment have been discussed with the patient. After consideration of the risks, benefits and treatment options the patient has consented to the planned procedure.   The patient has been seen and labs reviewed. There are no changes in the patient's condition to prevent proceeding with the planned procedure today.  Recent labs:  Lab Results  Component Value Date   WBC 10.1 08/17/2018   HGB 13.2 08/17/2018   HCT 41.9 08/17/2018   PLT 221 08/17/2018   GLUCOSE 159 (H) 08/17/2018   CHOL 220 (H) 05/11/2011   TRIG 576 (H) 05/11/2011   HDL 40 05/11/2011   LDLDIRECT 123 (H) 01/24/2018   [place]  05/11/2011     Comment:       Not calculated due to Triglyceride >400. Suggest ordering Direct LDL (Unit Code: 6234794269).   Total Cholesterol/HDL Ratio:CHD Risk                        Coronary Heart Disease Risk Table                                        Men       Women          1/2 Average Risk              3.4        3.3              Average Risk              5.0        4.4           2X Average Risk              9.6        7.1           3X Average Risk             23.4       11.0 Use the calculated Patient Ratio above and the CHD Risk table  to determine the patient's CHD Risk. ATP III Classification (LDL):       < 100        mg/dL         Optimal      100 - 129     mg/dL         Near or Above Optimal      130 - 159     mg/dL         Borderline High      160 - 189     mg/dL         High       > 190        mg/dL         Very High     ALT 21 08/13/2018   AST 21 08/13/2018   NA 138 08/17/2018   K 4.6 08/17/2018   CL 101 08/17/2018   CREATININE 0.96 08/17/2018   BUN  13 08/17/2018   CO2  26 08/17/2018   TSH 2.136 08/13/2018   INR 0.97 08/13/2018   HGBA1C 6.2 (H) 08/13/2018    [name], MD 08/17/2018 7:09 AM

## 2018-08-17 NOTE — Transfer of Care (Signed)
Immediate Anesthesia Transfer of Care Note  Patient: Pamela Thomas  Procedure(s) Performed: CORONARY ARTERY BYPASS GRAFTING (CABG) TIMES TWO: LIMA to LAD, SVG to RAMUS INTERMEDIATE)  USING LEFT INTERNAL MAMMARY ARTERY AND RIGHT GREATER SAPHENOUS VEIN HARVESTED ENDOSCOPICALLY. (N/A Chest) TRANSESOPHAGEAL ECHOCARDIOGRAM (TEE) (N/A )  Patient Location: ICU - Report to Caitlyn RN in 2H20  Anesthesia Type:General  Level of Consciousness: sedated and Patient remains intubated per anesthesia plan ( Precedex infusion )  Airway & Oxygen Therapy: Patient remains intubated per anesthesia plan and Patient placed on Ventilator (see vital sign flow sheet for setting)   RT at bedside, placed on 12/5 50% FiO2 SIMV mode, bilateral breath sounds confirmed on ventilator, clear.   Post-op Assessment: Report given to RN and Post -op Vital signs reviewed and stable  Post vital signs: Reviewed and stable  Last Vitals:  Vitals Value Taken Time  BP 107/45 ABP 08/17/2018 12:52 PM  Temp    Pulse 89 - AOO pacing 08/17/2018 12:55 PM  Resp 12 08/17/2018 12:55 PM  SpO2 94 % 08/17/2018 12:55 PM  Vitals shown include unvalidated device data.  Last Pain:  Vitals:   08/17/18 0313  TempSrc: Oral  PainSc:         Complications: No apparent anesthesia complications

## 2018-08-17 NOTE — Progress Notes (Signed)
Came to room to perform weaning parameters, ABG acidotic outside of range per RN.  Pt placed back on full vent support.

## 2018-08-17 NOTE — Progress Notes (Signed)
  Echocardiogram Echocardiogram Transesophageal has been performed.  Darlina Sicilian M 08/17/2018, 8:12 AM

## 2018-08-18 ENCOUNTER — Inpatient Hospital Stay (HOSPITAL_COMMUNITY): Payer: BLUE CROSS/BLUE SHIELD

## 2018-08-18 LAB — BASIC METABOLIC PANEL
Anion gap: 10 (ref 5–15)
BUN: 11 mg/dL (ref 8–23)
CO2: 20 mmol/L — ABNORMAL LOW (ref 22–32)
Calcium: 7.7 mg/dL — ABNORMAL LOW (ref 8.9–10.3)
Chloride: 110 mmol/L (ref 98–111)
Creatinine, Ser: 0.81 mg/dL (ref 0.44–1.00)
GFR calc Af Amer: >60 mL/min (ref >60)
GFR calc non Af Amer: >60 mL/min (ref >60)
Glucose, Bld: 113 mg/dL — ABNORMAL HIGH (ref 70–99)
Potassium: 4.1 mmol/L (ref 3.5–5.1)
Sodium: 140 mmol/L (ref 135–145)

## 2018-08-18 LAB — POCT I-STAT, CHEM 8
BUN: 10 mg/dL (ref 8–23)
Calcium, Ion: 1.15 mmol/L (ref 1.15–1.40)
Chloride: 102 mmol/L (ref 98–111)
Creatinine, Ser: 0.7 mg/dL (ref 0.44–1.00)
Glucose, Bld: 146 mg/dL — ABNORMAL HIGH (ref 70–99)
HCT: 28 % — ABNORMAL LOW (ref 36.0–46.0)
Hemoglobin: 9.5 g/dL — ABNORMAL LOW (ref 12.0–15.0)
Potassium: 4.4 mmol/L (ref 3.5–5.1)
Sodium: 137 mmol/L (ref 135–145)
TCO2: 23 mmol/L (ref 22–32)

## 2018-08-18 LAB — CBC
HCT: 30.8 % — ABNORMAL LOW (ref 36.0–46.0)
HCT: 29.3 % — ABNORMAL LOW (ref 36.0–46.0)
HEMOGLOBIN: 8.8 g/dL — AB (ref 12.0–15.0)
Hemoglobin: 9.5 g/dL — ABNORMAL LOW (ref 12.0–15.0)
MCH: 27.9 pg (ref 26.0–34.0)
MCH: 28.4 pg (ref 26.0–34.0)
MCHC: 30.8 g/dL (ref 30.0–36.0)
MCHC: 30 g/dL (ref 30.0–36.0)
MCV: 90.6 fL (ref 80.0–100.0)
MCV: 94.5 fL (ref 80.0–100.0)
Platelets: 194 10*3/uL (ref 150–400)
Platelets: 158 10*3/uL (ref 150–400)
RBC: 3.4 MIL/uL — ABNORMAL LOW (ref 3.87–5.11)
RBC: 3.1 MIL/uL — AB (ref 3.87–5.11)
RDW: 15.6 % — ABNORMAL HIGH (ref 11.5–15.5)
RDW: 16.1 % — ABNORMAL HIGH (ref 11.5–15.5)
WBC: 13.8 10*3/uL — ABNORMAL HIGH (ref 4.0–10.5)
WBC: 12.6 10*3/uL — ABNORMAL HIGH (ref 4.0–10.5)
nRBC: 0 % (ref 0.0–0.2)
nRBC: 0 % (ref 0.0–0.2)

## 2018-08-18 LAB — GLUCOSE, CAPILLARY
Glucose-Capillary: 107 mg/dL — ABNORMAL HIGH (ref 70–99)
Glucose-Capillary: 109 mg/dL — ABNORMAL HIGH (ref 70–99)
Glucose-Capillary: 113 mg/dL — ABNORMAL HIGH (ref 70–99)
Glucose-Capillary: 113 mg/dL — ABNORMAL HIGH (ref 70–99)
Glucose-Capillary: 113 mg/dL — ABNORMAL HIGH (ref 70–99)
Glucose-Capillary: 125 mg/dL — ABNORMAL HIGH (ref 70–99)
Glucose-Capillary: 139 mg/dL — ABNORMAL HIGH (ref 70–99)
Glucose-Capillary: 166 mg/dL — ABNORMAL HIGH (ref 70–99)
Glucose-Capillary: 176 mg/dL — ABNORMAL HIGH (ref 70–99)
Glucose-Capillary: 191 mg/dL — ABNORMAL HIGH (ref 70–99)
Glucose-Capillary: 99 mg/dL (ref 70–99)

## 2018-08-18 LAB — POCT I-STAT 3, ART BLOOD GAS (G3+)
Acid-base deficit: 1 mmol/L (ref 0.0–2.0)
Acid-base deficit: 1 mmol/L (ref 0.0–2.0)
Bicarbonate: 24.4 mmol/L (ref 20.0–28.0)
Bicarbonate: 25.3 mmol/L (ref 20.0–28.0)
O2 Saturation: 99 %
O2 Saturation: 96 %
Patient temperature: 97.9
Patient temperature: 97.9
TCO2: 26 mmol/L (ref 22–32)
TCO2: 27 mmol/L (ref 22–32)
pCO2 arterial: 44.2 mmHg (ref 32.0–48.0)
pCO2 arterial: 46.8 mmHg (ref 32.0–48.0)
pH, Arterial: 7.348 — ABNORMAL LOW (ref 7.350–7.450)
pH, Arterial: 7.339 — ABNORMAL LOW (ref 7.350–7.450)
pO2, Arterial: 127 mmHg — ABNORMAL HIGH (ref 83.0–108.0)
pO2, Arterial: 84 mmHg (ref 83.0–108.0)

## 2018-08-18 LAB — MAGNESIUM
MAGNESIUM: 2.4 mg/dL (ref 1.7–2.4)
Magnesium: 2.3 mg/dL (ref 1.7–2.4)

## 2018-08-18 LAB — CREATININE, SERUM
Creatinine, Ser: 1.05 mg/dL — ABNORMAL HIGH (ref 0.44–1.00)
GFR calc Af Amer: >60 mL/min (ref >60)
GFR, EST NON AFRICAN AMERICAN: 56 mL/min — AB (ref >60)

## 2018-08-18 MED ORDER — INSULIN ASPART 100 UNIT/ML ~~LOC~~ SOLN
6.0000 [IU] | Freq: Three times a day (TID) | SUBCUTANEOUS | Status: DC
  Administered 2018-08-18 (×2): 6 [IU] via SUBCUTANEOUS

## 2018-08-18 MED ORDER — PRASUGREL HCL 10 MG PO TABS
10.0000 mg | ORAL_TABLET | Freq: Every day | ORAL | Status: DC
  Administered 2018-08-18 – 2018-08-26 (×9): 10 mg via ORAL
  Filled 2018-08-18 (×10): qty 1

## 2018-08-18 MED ORDER — ORAL CARE MOUTH RINSE
15.0000 mL | Freq: Two times a day (BID) | OROMUCOSAL | Status: DC
  Administered 2018-08-20 – 2018-08-26 (×9): 15 mL via OROMUCOSAL

## 2018-08-18 MED ORDER — FUROSEMIDE 10 MG/ML IJ SOLN
40.0000 mg | Freq: Once | INTRAMUSCULAR | Status: AC
  Administered 2018-08-18: 40 mg via INTRAVENOUS
  Filled 2018-08-18: qty 4

## 2018-08-18 MED ORDER — ENOXAPARIN SODIUM 40 MG/0.4ML ~~LOC~~ SOLN
40.0000 mg | Freq: Every day | SUBCUTANEOUS | Status: DC
  Administered 2018-08-18 – 2018-08-25 (×8): 40 mg via SUBCUTANEOUS
  Filled 2018-08-18 (×9): qty 0.4

## 2018-08-18 MED ORDER — INSULIN ASPART 100 UNIT/ML ~~LOC~~ SOLN
0.0000 [IU] | SUBCUTANEOUS | Status: DC
  Administered 2018-08-18 (×2): 4 [IU] via SUBCUTANEOUS
  Administered 2018-08-19 (×2): 2 [IU] via SUBCUTANEOUS

## 2018-08-18 MED ORDER — INSULIN DETEMIR 100 UNIT/ML ~~LOC~~ SOLN
30.0000 [IU] | Freq: Two times a day (BID) | SUBCUTANEOUS | Status: DC
  Administered 2018-08-18 (×2): 30 [IU] via SUBCUTANEOUS
  Filled 2018-08-18 (×3): qty 0.3

## 2018-08-18 MED ORDER — ASPIRIN EC 81 MG PO TBEC
81.0000 mg | DELAYED_RELEASE_TABLET | Freq: Every day | ORAL | Status: DC
  Administered 2018-08-18 – 2018-08-26 (×9): 81 mg via ORAL
  Filled 2018-08-18 (×9): qty 1

## 2018-08-18 NOTE — Procedures (Signed)
Extubation Procedure Note  Patient Details:   Name: BERMA HARTS DOB: [birth year] MRN: [id number]   Airway Documentation:    Vent end date: 08/18/18 Vent end time: 0550   Evaluation  O2 sats: stable throughout Complications: No apparent complications Patient did tolerate procedure well. Bilateral Breath Sounds: Clear, Diminished, Other (Comment)(slightly coarse)   Yes   Pt tolerated wean, achieved around 660mL VC and -20 NIF. Pt placed on 4L [place], no stridor or dyspnea noted after extubation.   Mariam Dollar 08/18/2018, 5:58 AM

## 2018-08-18 NOTE — Progress Notes (Signed)
1 Day Post-Op Procedure(s) (LRB): CORONARY ARTERY BYPASS GRAFTING (CABG) TIMES TWO: LIMA to LAD, SVG to RAMUS INTERMEDIATE)  USING LEFT INTERNAL MAMMARY ARTERY AND RIGHT GREATER SAPHENOUS VEIN HARVESTED ENDOSCOPICALLY. (N/A) TRANSESOPHAGEAL ECHOCARDIOGRAM (TEE) (N/A) Subjective: Extubated earlier this AM C/o incisional pain  Objective: Vital signs in last 24 hours: Temp:  [97.3 F (36.3 C)-98.5 F (36.9 C)] 98.2 F (36.8 C) (12/07 0755) Pulse Rate:  [66-99] 89 (12/07 0800) Cardiac Rhythm: Ventricular paced (12/07 0800) Resp:  [4-23] 16 (12/07 0800) BP: (72-117)/(39-78) 102/63 (12/07 0800) SpO2:  [94 %-100 %] 97 % (12/07 0800) Arterial Line BP: (89-143)/(54-71) 143/62 (12/07 0800) FiO2 (%):  [40 %-50 %] 40 % (12/07 0520) Weight:  [118.2 kg] 118.2 kg (12/07 0500)  Hemodynamic parameters for last 24 hours: PAP: (20)/(9) 20/9 CO:  [4 L/min] 4 L/min CI:  [1.9 L/min/m2-2.1 L/min/m2] 1.9 L/min/m2  Intake/Output from previous day: 12/06 0701 - 12/07 0700 In: 7722.4 [I.V.:4823; Blood:800; IV Piggyback:2099.4] Out: 2305 [Urine:1075; Blood:850; Chest Tube:280] Intake/Output this shift: Total I/O In: 44.9 [I.V.:44.9] Out: 120 [Urine:70; Chest Tube:50]  General appearance: alert, cooperative and no distress Neurologic: intact Heart: regular rate and rhythm Lungs: diminished breath sounds bibasilar Abdomen: normal findings: soft, non-tender + air leak from chest tubes  Lab Results: Recent Labs    08/17/18 1901 08/18/18 0415  WBC 13.2* 13.8*  HGB 10.9*  10.4* 9.5*  HCT 32.0*  34.2* 30.8*  PLT 201 194   BMET:  Recent Labs    08/17/18 0557  08/17/18 1901 08/18/18 0415  NA 138   < > 141 140  K 4.6   < > 4.6 4.1  CL 101   < > 106 110  CO2 26  --   --  20*  GLUCOSE 159*   < > 126* 113*  BUN 13   < > 11 11  CREATININE 0.96   < > 0.70  0.85 0.81  CALCIUM 9.1  --   --  7.7*   < > = values in this interval not displayed.    PT/INR:  Recent Labs    08/17/18 1256   LABPROT 17.1*  INR 1.41   ABG    Component Value Date/Time   PHART 7.339 (L) 08/18/2018 0651   HCO3 25.3 08/18/2018 0651   TCO2 27 08/18/2018 0651   ACIDBASEDEF 1.0 08/18/2018 0651   O2SAT 96.0 08/18/2018 0651   CBG (last 3)  Recent Labs    08/18/18 0536 08/18/18 0649 08/18/18 0754  GLUCAP 107* 113* 113*    Assessment/Plan: S/P Procedure(s) (LRB): CORONARY ARTERY BYPASS GRAFTING (CABG) TIMES TWO: LIMA to LAD, SVG to RAMUS INTERMEDIATE)  USING LEFT INTERNAL MAMMARY ARTERY AND RIGHT GREATER SAPHENOUS VEIN HARVESTED ENDOSCOPICALLY. (N/A) TRANSESOPHAGEAL ECHOCARDIOGRAM (TEE) (N/A) -CV- s/p CABG. In SR, no inotropes  ASA, restart prasugrel today, lipitor, beta blocker RESP_ extubated, breathing comfortably  Air leak- keep CT in place RENAL- creatinine and lytes OK  Diurese ENDO- CBG controlled with insulin drip- transition to levemir + SSI Anemia secondary to ABL- mild, follow SCD + enoxaparin for DVT prophylaxis   LOS: 5 days    Melrose Nakayama 08/18/2018

## 2018-08-19 ENCOUNTER — Inpatient Hospital Stay (HOSPITAL_COMMUNITY): Payer: BLUE CROSS/BLUE SHIELD

## 2018-08-19 LAB — CBC
HCT: 29.8 % — ABNORMAL LOW (ref 36.0–46.0)
Hemoglobin: 8.8 g/dL — ABNORMAL LOW (ref 12.0–15.0)
MCH: 27.9 pg (ref 26.0–34.0)
MCHC: 29.5 g/dL — ABNORMAL LOW (ref 30.0–36.0)
MCV: 94.6 fL (ref 80.0–100.0)
Platelets: 163 10*3/uL (ref 150–400)
RBC: 3.15 MIL/uL — ABNORMAL LOW (ref 3.87–5.11)
RDW: 16.3 % — ABNORMAL HIGH (ref 11.5–15.5)
WBC: 12.5 10*3/uL — ABNORMAL HIGH (ref 4.0–10.5)
nRBC: 0 % (ref 0.0–0.2)

## 2018-08-19 LAB — BASIC METABOLIC PANEL
Anion gap: 8 (ref 5–15)
BUN: 10 mg/dL (ref 8–23)
CO2: 27 mmol/L (ref 22–32)
Calcium: 8 mg/dL — ABNORMAL LOW (ref 8.9–10.3)
Chloride: 103 mmol/L (ref 98–111)
Creatinine, Ser: 0.8 mg/dL (ref 0.44–1.00)
GFR calc non Af Amer: >60 mL/min (ref >60)
Glucose, Bld: 153 mg/dL — ABNORMAL HIGH (ref 70–99)
Potassium: 4.5 mmol/L (ref 3.5–5.1)
Sodium: 138 mmol/L (ref 135–145)

## 2018-08-19 LAB — GLUCOSE, CAPILLARY
Glucose-Capillary: 120 mg/dL — ABNORMAL HIGH (ref 70–99)
Glucose-Capillary: 127 mg/dL — ABNORMAL HIGH (ref 70–99)
Glucose-Capillary: 133 mg/dL — ABNORMAL HIGH (ref 70–99)
Glucose-Capillary: 139 mg/dL — ABNORMAL HIGH (ref 70–99)
Glucose-Capillary: 181 mg/dL — ABNORMAL HIGH (ref 70–99)
Glucose-Capillary: 191 mg/dL — ABNORMAL HIGH (ref 70–99)

## 2018-08-19 MED ORDER — POTASSIUM CHLORIDE CRYS ER 10 MEQ PO TBCR
20.0000 meq | EXTENDED_RELEASE_TABLET | Freq: Every day | ORAL | Status: DC
  Administered 2018-08-19 – 2018-08-26 (×8): 20 meq via ORAL
  Filled 2018-08-19 (×8): qty 2

## 2018-08-19 MED ORDER — METOPROLOL TARTRATE 25 MG PO TABS
25.0000 mg | ORAL_TABLET | Freq: Two times a day (BID) | ORAL | Status: DC
  Administered 2018-08-19 – 2018-08-22 (×7): 25 mg via ORAL
  Filled 2018-08-19 (×7): qty 1

## 2018-08-19 MED ORDER — FUROSEMIDE 40 MG PO TABS
40.0000 mg | ORAL_TABLET | Freq: Every day | ORAL | Status: DC
  Administered 2018-08-19 – 2018-08-26 (×6): 40 mg via ORAL
  Filled 2018-08-19 (×6): qty 1

## 2018-08-19 MED ORDER — METFORMIN HCL 500 MG PO TABS
1000.0000 mg | ORAL_TABLET | Freq: Two times a day (BID) | ORAL | Status: DC
  Administered 2018-08-19 – 2018-08-26 (×13): 1000 mg via ORAL
  Filled 2018-08-19 (×13): qty 2

## 2018-08-19 MED ORDER — INSULIN ASPART 100 UNIT/ML ~~LOC~~ SOLN: 0.0000 [IU] | Freq: Every day | SUBCUTANEOUS | Status: DC

## 2018-08-19 MED ORDER — METOPROLOL TARTRATE 25 MG/10 ML ORAL SUSPENSION: 25.0000 mg | Freq: Two times a day (BID) | ORAL | Status: DC

## 2018-08-19 MED ORDER — IRBESARTAN 300 MG PO TABS
300.0000 mg | ORAL_TABLET | Freq: Every day | ORAL | Status: DC
  Administered 2018-08-19 – 2018-08-26 (×8): 300 mg via ORAL
  Filled 2018-08-19 (×8): qty 1

## 2018-08-19 MED ORDER — INSULIN ASPART 100 UNIT/ML ~~LOC~~ SOLN
0.0000 [IU] | Freq: Three times a day (TID) | SUBCUTANEOUS | Status: DC
  Administered 2018-08-19 (×2): 4 [IU] via SUBCUTANEOUS
  Administered 2018-08-20 – 2018-08-22 (×5): 3 [IU] via SUBCUTANEOUS
  Administered 2018-08-23: 4 [IU] via SUBCUTANEOUS

## 2018-08-19 NOTE — Progress Notes (Signed)
      [address]        [place] [address]             [phone]      Sleeping BP 117/87 (BP Location: Right Wrist)   Pulse 73   Temp 98.4 F (36.9 C) (Oral)   Resp 12   Ht 5\' 2"  (1.575 m)   Wt 114.9 kg   LMP 10/10/2010   SpO2 100%   BMI 46.33 kg/m   Intake/Output Summary (Last 24 hours) at 08/19/2018 1831 Last data filed at 08/19/2018 1700 Gross per 24 hour  Intake 1559.96 ml  Output 1320 ml  Net 239.96 ml   CBG mildly elevated  Remo Lipps C. Roxan Hockey, MD [facility] [phone]

## 2018-08-19 NOTE — Op Note (Signed)
NAME: Pamela Thomas, Pamela A. MEDICAL RECORD ER:[id number] ACCOUNT [id number] DATE OF BIRTH:May 03, 1956 FACILITY: [facility] LOCATION: [facility] PHYSICIAN:Kaysea Raya B. Servando Snare, MD  OPERATIVE REPORT  DATE OF PROCEDURE:  08/17/2018  PREOPERATIVE DIAGNOSIS:  Coronary occlusive disease with ST elevation myocardial infarction 1 month previously treated with acute stenting of the right coronary artery.  POSTOPERATIVE DIAGNOSIS:  Coronary occlusive disease with ST elevation myocardial infarction 1 month previously treated with acute stenting of the right coronary artery.  SURGICAL PROCEDURE:  Coronary artery bypass grafting x2 with the left internal mammary to the left anterior descending coronary artery and reverse saphenous vein graft to the intermediate coronary artery with right greater saphenous thigh endoscopic vein  harvesting.  SURGEON:  Pamela Bal, MD  FIRST ASSISTANT:  Lars Pinks, PA.  BRIEF HISTORY:  The patient is a 62 year old diabetic female who had presented to [facility] approximately a month prior to surgery with symptoms of acute inferior myocardial infarction after having several weeks of increasing shortness of breath, dyspnea  on exertion, nocturnal dyspnea and vague chest discomfort.  She underwent urgent cardiac catheterization at that time and was found to have a total occlusion of a large dominant right coronary system.  This was treated with placement of multiple  angioplasties and treatment of multiple stents.  She was started on Brilinta.  In addition at the time of catheterization, she was found to have a significant complex distal left main disease and proximal LAD disease with a very small nondominant  circumflex.  These lesions were initially treated medically and she was referred back to cardiology in [facility] after her discharge from the acute myocardial infarction.  Her films were reviewed with interventional cardiology here and because of the  complex nature of her  LAD and intermediate distal left main disease, it was felt that coronary artery bypass grafting because of her significant left main disease was preferable to attempt a complex angioplasty at the point of bifurcation.  Risks and  options of surgery were discussed with the patient and it was recommended that she undergo coronary artery bypass grafting.  She was readmitted to the hospital by cardiology to transition off her Brilinta, which was done over several days while on IV  Aggrastat.  She remained stable during this time without recurrent symptoms.  Repeat echocardiogram showed good ventricular function without evidence of inferior hypokinesis.  Risks and options were discussed with the patient and she was agreeable and  signed informed consent.  DESCRIPTION OF PROCEDURE:  With Swan-Ganz and arterial line monitors in place, the patient underwent general endotracheal anesthesia without incident.  Skin of the chest and legs was prepped with Betadine and draped in the usual sterile manner.   Appropriate timeout was performed.  We then proceeded with harvesting a segment of greater saphenous vein endoscopically from the right thigh.  Median sternotomy was performed.  The left internal mammary artery was dissected down as a pedicle graft.  The  distal artery was divided.  It had good free flow.  The pericardium was opened.  Overall, ventricular function appeared preserved.  The patient was systemically heparinized.  The ascending aorta was cannulated.  The right atrium was cannulated with a  dual stage venous cannula.  The patient was then placed on cardiopulmonary bypass at 2.4 liters per minute per meter square.  Sites of anastomosis were selected and dissected at the epicardium.  The patient's body temperature was cooled to 32 degrees.   Aortic crossclamp was applied; 500 mL of cold blood potassium cardioplegia was  administered with diastolic arrest of the heart.  Myocardial septal temperature was  monitored throughout the crossclamp period.  With the circumflex being a very small  nondominant vessel, this was not bypassable however, there was a large intermediate vessel supplying the lateral wall and in jeopardy because of significant stenosis.  This vessel was opened and admitted a 1.5 mm probe.  Using a running 7-0 Prolene,  distal anastomosis was performed with a segment of reverse saphenous vein graft.  We then turned our attention to the mid left anterior descending coronary artery, which was opened.  It was a somewhat thin walled vessel, but did admit a 1.5 mm probe  distally.  Using a running 8-0 Prolene, the left internal mammary artery was anastomosed to left anterior descending coronary artery.  With the clamp still in place, a single punch aortotomy was performed and the vein graft to the intermediate was  anastomosed to the ascending aorta.  Bulldog on the mammary artery was removed with prompt rise in myocardial septal temperature.  The heart was allowed to passively fill and deair.  The proximal anastomosis was completed.  Aortic crossclamp was removed  with total crossclamp time of 56 minutes.  The patient spontaneously converted to a sinus rhythm.  Sites of anastomosis were selected and examined without evidence of bleeding.  With the patient's body temperature rewarmed to 37 degrees, atrial and  ventricular pacing wires were applied.  Graft markers were applied.  The patient was then ventilated and weaned from cardiopulmonary bypass without difficulty.  She remained hemodynamically stable.  She was decannulated in the usual fashion.  Protamine  sulfate was administered with operative field hemostatic.  A left pleural tube and a Blake mediastinal drain were left in place.  The sternum was closed with #6 stainless steel wire.  Fascia was closed with interrupted 0 Vicryl, running 3-0 Vicryl in  subcutaneous tissue and a 4-0 subcuticular stitch in skin edges.  Dry dressings were applied.   Sponge and needle count was reported as correct at completion of the procedure.  The patient tolerated the procedure without obvious complication.  Because of  her diabetes and BMI, a Prevena wound VAC was placed on the incision after it was closed.  The patient was transferred to the surgical intensive care unit for further care.  She did not require any blood bank blood products during the operative  procedure.  Total pump time was 77 minutes.  TN/NUANCE  D:08/19/2018 T:08/19/2018 JOB:[id number]

## 2018-08-19 NOTE — Progress Notes (Signed)
2 Days Post-Op Procedure(s) (LRB): CORONARY ARTERY BYPASS GRAFTING (CABG) TIMES TWO: LIMA to LAD, SVG to RAMUS INTERMEDIATE)  USING LEFT INTERNAL MAMMARY ARTERY AND RIGHT GREATER SAPHENOUS VEIN HARVESTED ENDOSCOPICALLY. (N/A) TRANSESOPHAGEAL ECHOCARDIOGRAM (TEE) (N/A) Subjective: Feels tired, pain reasonably well controlled  Objective: Vital signs in last 24 hours: Temp:  [97.9 F (36.6 C)-98.4 F (36.9 C)] 98 F (36.7 C) (12/08 0819) Pulse Rate:  [75-90] 81 (12/08 0600) Cardiac Rhythm: Normal sinus rhythm (12/08 0100) Resp:  [13-27] 18 (12/08 0600) BP: (103-167)/(57-124) 144/69 (12/08 0600) SpO2:  [94 %-100 %] 99 % (12/08 0600) Weight:  [114.9 kg] 114.9 kg (12/08 0500)  Hemodynamic parameters for last 24 hours:    Intake/Output from previous day: 12/07 0701 - 12/08 0700 In: 7121 [I.V.:922.1; IV Piggyback:399.9] Out: 9758 [Urine:1330; Chest Tube:320] Intake/Output this shift: No intake/output data recorded.  General appearance: alert, cooperative and no distress Neurologic: intact Heart: regular rate and rhythm Lungs: diminished breath sounds bibasilar Abdomen: normal findings: soft, non-tender small air leak  Lab Results: Recent Labs    08/18/18 1748 08/18/18 1749 08/19/18 0508  WBC 12.6*  --  12.5*  HGB 8.8* 9.5* 8.8*  HCT 29.3* 28.0* 29.8*  PLT 158  --  163   BMET:  Recent Labs    08/18/18 0415  08/18/18 1749 08/19/18 0508  NA 140  --  137 138  K 4.1  --  4.4 4.5  CL 110  --  102 103  CO2 20*  --   --  27  GLUCOSE 113*  --  146* 153*  BUN 11  --  10 10  CREATININE 0.81   < > 0.70 0.80  CALCIUM 7.7*  --   --  8.0*   < > = values in this interval not displayed.    PT/INR:  Recent Labs    08/17/18 1256  LABPROT 17.1*  INR 1.41   ABG    Component Value Date/Time   PHART 7.339 (L) 08/18/2018 0651   HCO3 25.3 08/18/2018 0651   TCO2 23 08/18/2018 1749   ACIDBASEDEF 1.0 08/18/2018 0651   O2SAT 96.0 08/18/2018 0651   CBG (last 3)  Recent Labs     08/18/18 2358 08/19/18 0430 08/19/18 0818  GLUCAP 127* 133* 139*    Assessment/Plan: S/P Procedure(s) (LRB): CORONARY ARTERY BYPASS GRAFTING (CABG) TIMES TWO: LIMA to LAD, SVG to RAMUS INTERMEDIATE)  USING LEFT INTERNAL MAMMARY ARTERY AND RIGHT GREATER SAPHENOUS VEIN HARVESTED ENDOSCOPICALLY. (N/A) TRANSESOPHAGEAL ECHOCARDIOGRAM (TEE) (N/A) -CV- in SR  Hypertension- increase metoprolol, restart ARB RESP- still has a small air leak from chest tubes although decreased from yesterday RENAL- Creatinine and lytes OK  PO lasix ENDO_ CBG mildly elevated- dc levemir, resume metformin Anemia secondary to ABL- mild, follow Continue cardiac rehab   LOS: 6 days    Melrose Nakayama 08/19/2018

## 2018-08-20 ENCOUNTER — Encounter (HOSPITAL_COMMUNITY): Payer: Self-pay | Admitting: Cardiothoracic Surgery

## 2018-08-20 ENCOUNTER — Inpatient Hospital Stay (HOSPITAL_COMMUNITY): Payer: BLUE CROSS/BLUE SHIELD

## 2018-08-20 ENCOUNTER — Inpatient Hospital Stay: Payer: Self-pay

## 2018-08-20 LAB — BASIC METABOLIC PANEL
Anion gap: 5 (ref 5–15)
Anion gap: 8 (ref 5–15)
BUN: 21 mg/dL (ref 8–23)
BUN: 27 mg/dL — ABNORMAL HIGH (ref 8–23)
CO2: 29 mmol/L (ref 22–32)
CO2: 27 mmol/L (ref 22–32)
Calcium: 8.4 mg/dL — ABNORMAL LOW (ref 8.9–10.3)
Calcium: 8.5 mg/dL — ABNORMAL LOW (ref 8.9–10.3)
Chloride: 105 mmol/L (ref 98–111)
Chloride: 103 mmol/L (ref 98–111)
Creatinine, Ser: 1.42 mg/dL — ABNORMAL HIGH (ref 0.44–1.00)
Creatinine, Ser: 1.62 mg/dL — ABNORMAL HIGH (ref 0.44–1.00)
GFR calc Af Amer: 45 mL/min — ABNORMAL LOW (ref >60)
GFR calc Af Amer: 39 mL/min — ABNORMAL LOW (ref >60)
GFR calc non Af Amer: 39 mL/min — ABNORMAL LOW (ref >60)
GFR calc non Af Amer: 33 mL/min — ABNORMAL LOW (ref >60)
GLUCOSE: 121 mg/dL — AB (ref 70–99)
Glucose, Bld: 146 mg/dL — ABNORMAL HIGH (ref 70–99)
Potassium: 4.9 mmol/L (ref 3.5–5.1)
Potassium: 4.8 mmol/L (ref 3.5–5.1)
Sodium: 139 mmol/L (ref 135–145)
Sodium: 138 mmol/L (ref 135–145)

## 2018-08-20 LAB — TYPE AND SCREEN
ABO/RH(D): AB NEG
Antibody Screen: NEGATIVE
Unit division: 0
Unit division: 0
Unit division: 0
Unit division: 0

## 2018-08-20 LAB — BPAM RBC
Blood Product Expiration Date: 201912102359
Blood Product Expiration Date: 201912132359
Blood Product Expiration Date: 202001032359
Blood Product Expiration Date: 202001042359
ISSUE DATE / TIME: 201912060958
ISSUE DATE / TIME: 201912081738
Unit Type and Rh: 600
Unit Type and Rh: 600
Unit Type and Rh: 9500
Unit Type and Rh: 9500

## 2018-08-20 LAB — GLUCOSE, CAPILLARY
Glucose-Capillary: 143 mg/dL — ABNORMAL HIGH (ref 70–99)
Glucose-Capillary: 119 mg/dL — ABNORMAL HIGH (ref 70–99)
Glucose-Capillary: 116 mg/dL — ABNORMAL HIGH (ref 70–99)
Glucose-Capillary: 118 mg/dL — ABNORMAL HIGH (ref 70–99)

## 2018-08-20 LAB — POCT I-STAT 3, ART BLOOD GAS (G3+)
Acid-base deficit: 2 mmol/L (ref 0.0–2.0)
Bicarbonate: 26.4 mmol/L (ref 20.0–28.0)
O2 Saturation: 97 %
Patient temperature: 97.8
TCO2: 28 mmol/L (ref 22–32)
pCO2 arterial: 62.1 mmHg — ABNORMAL HIGH (ref 32.0–48.0)
pH, Arterial: 7.234 — ABNORMAL LOW (ref 7.350–7.450)
pO2, Arterial: 103 mmHg (ref 83.0–108.0)

## 2018-08-20 LAB — CBC
HCT: 29.9 % — ABNORMAL LOW (ref 36.0–46.0)
Hemoglobin: 8.4 g/dL — ABNORMAL LOW (ref 12.0–15.0)
MCH: 27.5 pg (ref 26.0–34.0)
MCHC: 28.1 g/dL — ABNORMAL LOW (ref 30.0–36.0)
MCV: 98 fL (ref 80.0–100.0)
NRBC: 0 % (ref 0.0–0.2)
Platelets: 203 10*3/uL (ref 150–400)
RBC: 3.05 MIL/uL — ABNORMAL LOW (ref 3.87–5.11)
RDW: 16.7 % — ABNORMAL HIGH (ref 11.5–15.5)
WBC: 13.7 10*3/uL — ABNORMAL HIGH (ref 4.0–10.5)

## 2018-08-20 MED ORDER — NALOXONE HCL 0.4 MG/ML IJ SOLN
INTRAMUSCULAR | Status: AC
  Administered 2018-08-20: 0.4 mg
  Filled 2018-08-20: qty 1

## 2018-08-20 MED ORDER — SODIUM CHLORIDE 0.9% FLUSH
10.0000 mL | Freq: Two times a day (BID) | INTRAVENOUS | Status: DC
  Administered 2018-08-20: 20 mL
  Administered 2018-08-20: 10 mL
  Administered 2018-08-21 – 2018-08-23 (×5): 20 mL
  Administered 2018-08-23 – 2018-08-26 (×6): 10 mL

## 2018-08-20 MED ORDER — FUROSEMIDE 10 MG/ML IJ SOLN
40.0000 mg | Freq: Once | INTRAMUSCULAR | Status: AC
  Administered 2018-08-20: 40 mg via INTRAVENOUS
  Filled 2018-08-20: qty 4

## 2018-08-20 MED ORDER — TRAMADOL HCL 50 MG PO TABS: 50.0000 mg | ORAL_TABLET | ORAL | Status: DC | PRN

## 2018-08-20 MED ORDER — NALOXONE HCL 0.4 MG/ML IJ SOLN: 0.4000 mg | INTRAMUSCULAR | Status: DC | PRN

## 2018-08-20 MED ORDER — SODIUM CHLORIDE 0.9% FLUSH: 10.0000 mL | INTRAVENOUS | Status: DC | PRN

## 2018-08-20 MED ORDER — CHLORHEXIDINE GLUCONATE CLOTH 2 % EX PADS
6.0000 | MEDICATED_PAD | Freq: Every day | CUTANEOUS | Status: DC
  Administered 2018-08-20 – 2018-08-26 (×7): 6 via TOPICAL

## 2018-08-20 MED FILL — Magnesium Sulfate Inj 50%: INTRAMUSCULAR | Qty: 10 | Status: AC

## 2018-08-20 MED FILL — Heparin Sodium (Porcine) Inj 1000 Unit/ML: INTRAMUSCULAR | Qty: 10 | Status: AC

## 2018-08-20 MED FILL — Sodium Bicarbonate IV Soln 8.4%: INTRAVENOUS | Qty: 50 | Status: AC

## 2018-08-20 MED FILL — Lidocaine HCl(Cardiac) IV PF Soln Pref Syr 100 MG/5ML (2%): INTRAVENOUS | Qty: 5 | Status: AC

## 2018-08-20 MED FILL — Heparin Sodium (Porcine) Inj 1000 Unit/ML: INTRAMUSCULAR | Qty: 30 | Status: AC

## 2018-08-20 MED FILL — Potassium Chloride Inj 2 mEq/ML: INTRAVENOUS | Qty: 40 | Status: AC

## 2018-08-20 MED FILL — Mannitol IV Soln 20%: INTRAVENOUS | Qty: 500 | Status: AC

## 2018-08-20 MED FILL — Electrolyte-R (PH 7.4) Solution: INTRAVENOUS | Qty: 3000 | Status: AC

## 2018-08-20 MED FILL — Sodium Chloride IV Soln 0.9%: INTRAVENOUS | Qty: 3000 | Status: AC

## 2018-08-20 NOTE — Progress Notes (Signed)
Bladder scan amount greater than 650 ml. Pt unable to void. In and out cath performed with Idelia Salm RN. 500 ml of UOP. Pt education performed.

## 2018-08-20 NOTE — Significant Event (Signed)
Assumed care of patient this afternoon. Patient continues to be very lethargic, could barely open her eyes even after being stimulated. Managed to get patient out of bed to chair and attempted to work on incentive spirometry. Called to Dr. Servando Snare regarding persistent somnolent. New orders for ABG. Results of ABG given to MD. New orders for narcan X 1 and Bipap. Patient responded to interventions. A lot more awake now and able to converse with staff and spouse at bedside.    Pamela Thomas

## 2018-08-20 NOTE — Progress Notes (Signed)
CT surgery p.m. Rounds   Patient now sitting up in chair on BiPAP.  The patient became lethargic during the day and a ABG was checked.  Oxygenation was adequate but PCO2 was 62 and patient was placed on BiPAP.  She appears more responsive with stable hemodynamics. Extra Lasix ordered this p.m. for volume overload Continue to monitor respiratory status, maintain BiPAP, and recheck ABG early a.m.

## 2018-08-20 NOTE — Progress Notes (Signed)
Peripherally Inserted Central Catheter/Midline Placement  The IV Nurse has discussed with the patient and/or persons authorized to consent for the patient, the purpose of this procedure and the potential benefits and risks involved with this procedure.  The benefits include less needle sticks, lab draws from the catheter, and the patient may be discharged home with the catheter. Risks include, but not limited to, infection, bleeding, blood clot (thrombus formation), and puncture of an artery; nerve damage and irregular heartbeat and possibility to perform a PICC exchange if needed/ordered by physician.  Alternatives to this procedure were also discussed.  Bard Power PICC patient education guide, fact sheet on infection prevention and patient information card has been provided to patient /or left at bedside.    PICC/Midline Placement Documentation  PICC Double Lumen 08/20/18 PICC Right Cephalic 36 cm 0 cm (Active)  Indication for Insertion or Continuance of Line Vasoactive infusions 08/20/2018 11:45 AM  Exposed Catheter (cm) 0 cm 08/20/2018 11:45 AM  Site Assessment Clean;Dry;Intact 08/20/2018 11:45 AM  Lumen #1 Status Flushed;Blood return noted 08/20/2018 11:45 AM  Lumen #2 Status Flushed;Blood return noted 08/20/2018 11:45 AM  Dressing Type Transparent 08/20/2018 11:45 AM  Dressing Status Clean;Dry;Intact;Antimicrobial disc in place 08/20/2018 11:45 AM  Dressing Intervention New dressing 08/20/2018 11:45 AM  Dressing Change Due 08/27/18 08/20/2018 11:45 AM       Pamela Thomas 08/20/2018, 11:46 AM

## 2018-08-20 NOTE — Progress Notes (Signed)
Patient ID: Pamela Thomas, female   DOB: 08-01-1956, 62 y.o.   MRN: [id number] T[facility] DAILY ICU PROGRESS NOTE                   [address]            [place] [address]          [phone]   3 Days Post-Op Procedure(s) (LRB): CORONARY ARTERY BYPASS GRAFTING (CABG) TIMES TWO: LIMA to LAD, SVG to RAMUS INTERMEDIATE)  USING LEFT INTERNAL MAMMARY ARTERY AND RIGHT GREATER SAPHENOUS VEIN HARVESTED ENDOSCOPICALLY. (N/A) TRANSESOPHAGEAL ECHOCARDIOGRAM (TEE) (N/A)  Total Length of Stay:  LOS: 7 days   Subjective: Patient sitting in chair somewhat somnolent but does know she is in a hospital, has ambulated some but very slow  Objective: Vital signs in last 24 hours: Temp:  [97.8 F (36.6 C)-98.5 F (36.9 C)] 97.9 F (36.6 C) (12/09 0700) Pulse Rate:  [61-95] 79 (12/09 0800) Cardiac Rhythm: Normal sinus rhythm (12/09 0400) Resp:  [10-20] 10 (12/09 0800) BP: (99-156)/(59-131) 115/68 (12/09 0800) SpO2:  [95 %-100 %] 98 % (12/09 0800) Weight:  [114.9 kg] 114.9 kg (12/09 0500)  Filed Weights   08/18/18 0500 08/19/18 0500 08/20/18 0500  Weight: 118.2 kg 114.9 kg 114.9 kg    Weight change: 0 kg   Hemodynamic parameters for last 24 hours:    Intake/Output from previous day: 12/08 0701 - 12/09 0700 In: 480 [P.O.:480] Out: 650 [Urine:550; Chest Tube:100]  Intake/Output this shift: No intake/output data recorded.  Current Meds: Scheduled Meds: . acetaminophen  1,000 mg Oral Q6H   Or  . acetaminophen (TYLENOL) oral liquid 160 mg/5 mL  1,000 mg Per Tube Q6H  . aspirin EC  81 mg Oral Daily  . atorvastatin  80 mg Oral q1800  . bisacodyl  10 mg Oral Daily   Or  . bisacodyl  10 mg Rectal Daily  . docusate sodium  200 mg Oral Daily  . enoxaparin (LOVENOX) injection  40 mg Subcutaneous QHS  . FLUoxetine  20 mg Oral Daily  . furosemide  40 mg Oral Daily  . insulin aspart  0-20 Units Subcutaneous TID WC  . insulin aspart  0-5 Units Subcutaneous QHS  . irbesartan   300 mg Oral Daily  . mouth rinse  15 mL Mouth Rinse BID  . metFORMIN  1,000 mg Oral BID WC  . metoprolol tartrate  25 mg Oral BID   Or  . metoprolol tartrate  25 mg Per Tube BID  . omega-3 acid ethyl esters  2 g Oral BID  . pantoprazole  40 mg Oral Daily  . potassium chloride  20 mEq Oral Daily  . prasugrel  10 mg Oral Daily  . sodium chloride flush  3 mL Intravenous Q12H   Continuous Infusions: . sodium chloride Stopped (08/18/18 0814)  . sodium chloride    . sodium chloride 20 mL/hr at 08/17/18 1326  . dexmedetomidine (PRECEDEX) IV infusion Stopped (08/17/18 1506)  . lactated ringers    . lactated ringers Stopped (08/18/18 0311)  . lactated ringers Stopped (08/18/18 1739)  . nitroGLYCERIN Stopped (08/17/18 1334)  . phenylephrine (NEO-SYNEPHRINE) Adult infusion Stopped (08/18/18 0729)   PRN Meds:.sodium chloride, lactated ringers, Melatonin, metoprolol tartrate, midazolam, morphine injection, ondansetron (ZOFRAN) IV, sodium chloride flush, traMADol  General appearance: alert and cooperative Neurologic: intact and But somewhat somnolent Heart: regular rate and rhythm, S1, S2 normal, no murmur, click, rub or gallop Lungs: diminished breath sounds  bibasilar Abdomen: soft, non-tender; bowel sounds normal; no masses,  no organomegaly Extremities: extremities normal, atraumatic, no cyanosis or edema and Homans sign is negative, no sign of DVT Wound: Incisional wound VAC in place and functioning  Lab Results: CBC: Recent Labs    08/19/18 0508 08/20/18 0549  WBC 12.5* 13.7*  HGB 8.8* 8.4*  HCT 29.8* 29.9*  PLT 163 203   BMET:  Recent Labs    08/19/18 0508 08/20/18 0549  NA 138 139  K 4.5 4.9  CL 103 105  CO2 27 29  GLUCOSE 153* 121*  BUN 10 21  CREATININE 0.80 1.42*  CALCIUM 8.0* 8.4*    CMET: Lab Results  Component Value Date   WBC 13.7 (H) 08/20/2018   HGB 8.4 (L) 08/20/2018   HCT 29.9 (L) 08/20/2018   PLT 203 08/20/2018   GLUCOSE 121 (H) 08/20/2018    CHOL 220 (H) 05/11/2011   TRIG 576 (H) 05/11/2011   HDL 40 05/11/2011   LDLDIRECT 123 (H) 01/24/2018   [place]  05/11/2011     Comment:       Not calculated due to Triglyceride >400. Suggest ordering Direct LDL (Unit Code: [phone]).   Total Cholesterol/HDL Ratio:CHD Risk                        Coronary Heart Disease Risk Table                                        Men       Women          1/2 Average Risk              3.4        3.3              Average Risk              5.0        4.4           2X Average Risk              9.6        7.1           3X Average Risk             23.4       11.0 Use the calculated Patient Ratio above and the CHD Risk table  to determine the patient's CHD Risk. ATP III Classification (LDL):       < 100        mg/dL         Optimal      100 - 129     mg/dL         Near or Above Optimal      130 - 159     mg/dL         Borderline High      160 - 189     mg/dL         High       > 190        mg/dL         Very High     ALT 21 08/13/2018   AST 21 08/13/2018   NA 139 08/20/2018   K 4.9 08/20/2018   CL 105 08/20/2018   CREATININE 1.42 (H) 08/20/2018  BUN 21 08/20/2018   CO2 29 08/20/2018   TSH 2.136 08/13/2018   INR 1.41 08/17/2018   HGBA1C 6.2 (H) 08/13/2018      PT/INR:  Recent Labs    08/17/18 1256  LABPROT 17.1*  INR 1.41   Radiology: No results found.   Assessment/Plan: S/P Procedure(s) (LRB): CORONARY ARTERY BYPASS GRAFTING (CABG) TIMES TWO: LIMA to LAD, SVG to RAMUS INTERMEDIATE)  USING LEFT INTERNAL MAMMARY ARTERY AND RIGHT GREATER SAPHENOUS VEIN HARVESTED ENDOSCOPICALLY. (N/A) TRANSESOPHAGEAL ECHOCARDIOGRAM (TEE) (N/A) Foley out, has not voided during the night Creatinine up slightly 1.42, will repeat DC IV pain medicine DC right IJ line in place PICC line Expected Acute  Blood - loss Anemia- continue to monitor      [name] 08/20/2018 8:35 AM

## 2018-08-21 ENCOUNTER — Inpatient Hospital Stay (HOSPITAL_COMMUNITY): Payer: BLUE CROSS/BLUE SHIELD

## 2018-08-21 LAB — BLOOD GAS, ARTERIAL
Acid-Base Excess: 2.6 mmol/L — ABNORMAL HIGH (ref 0.0–2.0)
Bicarbonate: 28.3 mmol/L — ABNORMAL HIGH (ref 20.0–28.0)
Delivery systems: POSITIVE
Drawn by: 35043
Expiratory PAP: 5
FIO2: 50
Inspiratory PAP: 12
O2 Saturation: 99.5 %
Patient temperature: 98.4
pCO2 arterial: 57.7 mmHg — ABNORMAL HIGH (ref 32.0–48.0)
pH, Arterial: 7.311 — ABNORMAL LOW (ref 7.350–7.450)
pO2, Arterial: 179 mmHg — ABNORMAL HIGH (ref 83.0–108.0)

## 2018-08-21 LAB — BASIC METABOLIC PANEL
Anion gap: 9 (ref 5–15)
BUN: 23 mg/dL (ref 8–23)
CO2: 29 mmol/L (ref 22–32)
Calcium: 8.7 mg/dL — ABNORMAL LOW (ref 8.9–10.3)
Chloride: 102 mmol/L (ref 98–111)
Creatinine, Ser: 1.06 mg/dL — ABNORMAL HIGH (ref 0.44–1.00)
GFR calc Af Amer: >60 mL/min (ref >60)
GFR calc non Af Amer: 56 mL/min — ABNORMAL LOW (ref >60)
Glucose, Bld: 130 mg/dL — ABNORMAL HIGH (ref 70–99)
Potassium: 4.5 mmol/L (ref 3.5–5.1)
Sodium: 140 mmol/L (ref 135–145)

## 2018-08-21 LAB — CBC
HCT: 28.6 % — ABNORMAL LOW (ref 36.0–46.0)
Hemoglobin: 8.1 g/dL — ABNORMAL LOW (ref 12.0–15.0)
MCH: 27.5 pg (ref 26.0–34.0)
MCHC: 28.3 g/dL — ABNORMAL LOW (ref 30.0–36.0)
MCV: 96.9 fL (ref 80.0–100.0)
Platelets: 206 10*3/uL (ref 150–400)
RBC: 2.95 MIL/uL — ABNORMAL LOW (ref 3.87–5.11)
RDW: 16.4 % — ABNORMAL HIGH (ref 11.5–15.5)
WBC: 8.6 10*3/uL (ref 4.0–10.5)
nRBC: 0 % (ref 0.0–0.2)

## 2018-08-21 LAB — GLUCOSE, CAPILLARY
Glucose-Capillary: 124 mg/dL — ABNORMAL HIGH (ref 70–99)
Glucose-Capillary: 135 mg/dL — ABNORMAL HIGH (ref 70–99)
Glucose-Capillary: 116 mg/dL — ABNORMAL HIGH (ref 70–99)
Glucose-Capillary: 107 mg/dL — ABNORMAL HIGH (ref 70–99)

## 2018-08-21 MED ORDER — FUROSEMIDE 10 MG/ML IJ SOLN
40.0000 mg | Freq: Two times a day (BID) | INTRAMUSCULAR | Status: AC
  Administered 2018-08-21 (×2): 40 mg via INTRAVENOUS
  Filled 2018-08-21 (×2): qty 4

## 2018-08-21 NOTE — Progress Notes (Signed)
Patient ID: Pamela Thomas, female   DOB: 1955-10-03, 62 y.o.   MRN: [id number] [facility] DAILY ICU PROGRESS NOTE                   [address]            [place], [address]          [phone]   4 Days Post-Op Procedure(s) (LRB): CORONARY ARTERY BYPASS GRAFTING (CABG) TIMES TWO: LIMA to LAD, SVG to RAMUS INTERMEDIATE)  USING LEFT INTERNAL MAMMARY ARTERY AND RIGHT GREATER SAPHENOUS VEIN HARVESTED ENDOSCOPICALLY. (N/A) TRANSESOPHAGEAL ECHOCARDIOGRAM (TEE) (N/A)  Total Length of Stay:  LOS: 8 days   Subjective: Patient up to chair more alert, follows commands, still on BiPAP  Objective: Vital signs in last 24 hours: Temp:  [97.7 F (36.5 C)-98.5 F (36.9 C)] 98 F (36.7 C) (12/10 0816) Pulse Rate:  [65-98] 88 (12/10 0800) Cardiac Rhythm: Normal sinus rhythm (12/10 0745) Resp:  [10-18] 13 (12/10 0800) BP: (85-156)/(45-74) 145/72 (12/10 0800) SpO2:  [99 %-100 %] 100 % (12/10 0800) FiO2 (%):  [40 %-60 %] 40 % (12/10 0800) Weight:  [112.7 kg] 112.7 kg (12/10 0500)  Filed Weights   08/19/18 0500 08/20/18 0500 08/21/18 0500  Weight: 114.9 kg 114.9 kg 112.7 kg    Weight change: -2.2 kg   Hemodynamic parameters for last 24 hours:    Intake/Output from previous day: 12/09 0701 - 12/10 0700 In: 50 [P.O.:50] Out: 2650 [Urine:2650]  Intake/Output this shift: No intake/output data recorded.  Current Meds: Scheduled Meds: . acetaminophen  1,000 mg Oral Q6H   Or  . acetaminophen (TYLENOL) oral liquid 160 mg/5 mL  1,000 mg Per Tube Q6H  . aspirin EC  81 mg Oral Daily  . atorvastatin  80 mg Oral q1800  . bisacodyl  10 mg Oral Daily   Or  . bisacodyl  10 mg Rectal Daily  . Chlorhexidine Gluconate Cloth  6 each Topical Daily  . docusate sodium  200 mg Oral Daily  . enoxaparin (LOVENOX) injection  40 mg Subcutaneous QHS  . FLUoxetine  20 mg Oral Daily  . furosemide  40 mg Intravenous BID  . furosemide  40 mg Oral Daily  . insulin aspart  0-20 Units  Subcutaneous TID WC  . insulin aspart  0-5 Units Subcutaneous QHS  . irbesartan  300 mg Oral Daily  . mouth rinse  15 mL Mouth Rinse BID  . metFORMIN  1,000 mg Oral BID WC  . metoprolol tartrate  25 mg Oral BID   Or  . metoprolol tartrate  25 mg Per Tube BID  . omega-3 acid ethyl esters  2 g Oral BID  . pantoprazole  40 mg Oral Daily  . potassium chloride  20 mEq Oral Daily  . prasugrel  10 mg Oral Daily  . sodium chloride flush  10-40 mL Intracatheter Q12H  . sodium chloride flush  3 mL Intravenous Q12H   Continuous Infusions: . sodium chloride Stopped (08/18/18 0814)  . sodium chloride    . sodium chloride 20 mL/hr at 08/17/18 1326  . dexmedetomidine (PRECEDEX) IV infusion Stopped (08/17/18 1506)  . lactated ringers    . lactated ringers Stopped (08/18/18 0311)  . lactated ringers Stopped (08/18/18 1739)  . nitroGLYCERIN Stopped (08/17/18 1334)  . phenylephrine (NEO-SYNEPHRINE) Adult infusion Stopped (08/18/18 0729)   PRN Meds:.sodium chloride, lactated ringers, Melatonin, metoprolol tartrate, midazolam, naLOXone (NARCAN)  injection, ondansetron (ZOFRAN) IV, sodium chloride flush, sodium chloride  flush, traMADol  General appearance: cooperative Neurologic: intact Heart: regular rate and rhythm, S1, S2 normal, no murmur, click, rub or gallop Lungs: diminished breath sounds LLL and RLL Abdomen: soft, non-tender; bowel sounds normal; no masses,  no organomegaly Extremities: extremities normal, atraumatic, no cyanosis or edema and Homans sign is negative, no sign of DVT Wound: Incisional wound VAC in place and functioning  Lab Results: CBC: Recent Labs    08/20/18 0549 08/21/18 0411  WBC 13.7* 8.6  HGB 8.4* 8.1*  HCT 29.9* 28.6*  PLT 203 206   BMET:  Recent Labs    08/20/18 1738 08/21/18 0411  NA 138 140  K 4.8 4.5  CL 103 102  CO2 27 29  GLUCOSE 146* 130*  BUN 27* 23  CREATININE 1.62* 1.06*  CALCIUM 8.5* 8.7*    CMET: Lab Results  Component Value Date    WBC 8.6 08/21/2018   HGB 8.1 (L) 08/21/2018   HCT 28.6 (L) 08/21/2018   PLT 206 08/21/2018   GLUCOSE 130 (H) 08/21/2018   CHOL 220 (H) 05/11/2011   TRIG 576 (H) 05/11/2011   HDL 40 05/11/2011   LDLDIRECT 123 (H) 01/24/2018   [place]  05/11/2011     Comment:       Not calculated due to Triglyceride >400. Suggest ordering Direct LDL (Unit Code: [phone]).   Total Cholesterol/HDL Ratio:CHD Risk                        Coronary Heart Disease Risk Table                                        Men       Women          1/2 Average Risk              3.4        3.3              Average Risk              5.0        4.4           2X Average Risk              9.6        7.1           3X Average Risk             23.4       11.0 Use the calculated Patient Ratio above and the CHD Risk table  to determine the patient's CHD Risk. ATP III Classification (LDL):       < 100        mg/dL         Optimal      100 - 129     mg/dL         Near or Above Optimal      130 - 159     mg/dL         Borderline High      160 - 189     mg/dL         High       > 190        mg/dL         Very High     ALT 21 08/13/2018  AST 21 08/13/2018   NA 140 08/21/2018   K 4.5 08/21/2018   CL 102 08/21/2018   CREATININE 1.06 (H) 08/21/2018   BUN 23 08/21/2018   CO2 29 08/21/2018   TSH 2.136 08/13/2018   INR 1.41 08/17/2018   HGBA1C 6.2 (H) 08/13/2018      PT/INR: No results for input(s): LABPROT, INR in the last 72 hours. Radiology: [name] Ekg Site Rite  Result Date: 08/20/2018 If [facility] image not attached, placement could not be confirmed due to current cardiac rhythm.    Assessment/Plan: S/P Procedure(s) (LRB): CORONARY ARTERY BYPASS GRAFTING (CABG) TIMES TWO: LIMA to LAD, SVG to RAMUS INTERMEDIATE)  USING LEFT INTERNAL MAMMARY ARTERY AND RIGHT GREATER SAPHENOUS VEIN HARVESTED ENDOSCOPICALLY. (N/A) TRANSESOPHAGEAL ECHOCARDIOGRAM (TEE) (N/A) Mobilize Diuresis Diabetes control Continue aggressive pulmonary  toilet Creatinine has quickly returned to baseline    Pamela Thomas 08/21/2018 8:43 AM

## 2018-08-21 NOTE — Progress Notes (Signed)
IPAP increased to 16 per increased CO2 on AM ABG.  RT will continue to monitor.

## 2018-08-21 NOTE — Progress Notes (Signed)
Patient ID: Pamela Thomas, female   DOB: [birth year], 62 y.o.   MRN: [id number] EVENING ROUNDS NOTE :     [address]       [place], [address]             [phone]                 4 Days Post-Op Procedure(s) (LRB): CORONARY ARTERY BYPASS GRAFTING (CABG) TIMES TWO: LIMA to LAD, SVG to RAMUS INTERMEDIATE)  USING LEFT INTERNAL MAMMARY ARTERY AND RIGHT GREATER SAPHENOUS VEIN HARVESTED ENDOSCOPICALLY. (N/A) TRANSESOPHAGEAL ECHOCARDIOGRAM (TEE) (N/A)  Total Length of Stay:  LOS: 8 days  BP (!) 105/52 (BP Location: Left Wrist)   Pulse 82   Temp 98.5 F (36.9 C) (Oral)   Resp 16   Ht 5\' 2"  (1.575 m)   Wt 112.7 kg   LMP 10/10/2010   SpO2 93%   BMI 45.44 kg/m   .Intake/Output      12/09 0701 - 12/10 0700 12/10 0701 - 12/11 0700   P.O. 50 240   I.V. (mL/kg) 0 (0)    Total Intake(mL/kg) 50 (0.4) 240 (2.1)   Urine (mL/kg/hr) 2650 (1)    Chest Tube     Total Output 2650    Net -2600 +240        Urine Occurrence  1 x     . sodium chloride Stopped (08/18/18 0814)  . sodium chloride    . sodium chloride 20 mL/hr at 08/17/18 1326  . dexmedetomidine (PRECEDEX) IV infusion Stopped (08/17/18 1506)  . lactated ringers    . lactated ringers Stopped (08/18/18 0311)  . lactated ringers Stopped (08/18/18 1739)  . nitroGLYCERIN Stopped (08/17/18 1334)  . phenylephrine (NEO-SYNEPHRINE) Adult infusion Stopped (08/18/18 0729)     Lab Results  Component Value Date   WBC 8.6 08/21/2018   HGB 8.1 (L) 08/21/2018   HCT 28.6 (L) 08/21/2018   PLT 206 08/21/2018   GLUCOSE 130 (H) 08/21/2018   CHOL 220 (H) 05/11/2011   TRIG 576 (H) 05/11/2011   HDL 40 05/11/2011   LDLDIRECT 123 (H) 01/24/2018   Kemp  05/11/2011     Comment:       Not calculated due to Triglyceride >400. Suggest ordering Direct LDL (Unit Code: [phone]).   Total Cholesterol/HDL Ratio:CHD Risk                        Coronary Heart Disease Risk Table                                        Men        Women          1/2 Average Risk              3.4        3.3              Average Risk              5.0        4.4           2X Average Risk              9.6        7.1           3X Average Risk  23.4       11.0 Use the calculated Patient Ratio above and the CHD Risk table  to determine the patient's CHD Risk. ATP III Classification (LDL):       < 100        mg/dL         Optimal      100 - 129     mg/dL         Near or Above Optimal      130 - 159     mg/dL         Borderline High      160 - 189     mg/dL         High       > 190        mg/dL         Very High     ALT 21 08/13/2018   AST 21 08/13/2018   NA 140 08/21/2018   K 4.5 08/21/2018   CL 102 08/21/2018   CREATININE 1.06 (H) 08/21/2018   BUN 23 08/21/2018   CO2 29 08/21/2018   TSH 2.136 08/13/2018   INR 1.41 08/17/2018   HGBA1C 6.2 (H) 08/13/2018   More alert, neuro intact Walked Spalding MD  Beeper [phone] Office [phone] 08/21/2018 4:46 PM

## 2018-08-21 NOTE — Progress Notes (Signed)
BIPAP on stand by at bedside. Currently on  1lpm. No distress noted.

## 2018-08-22 ENCOUNTER — Inpatient Hospital Stay (HOSPITAL_COMMUNITY): Payer: BLUE CROSS/BLUE SHIELD

## 2018-08-22 LAB — BASIC METABOLIC PANEL
Anion gap: 9 (ref 5–15)
BUN: 22 mg/dL (ref 8–23)
CO2: 30 mmol/L (ref 22–32)
Calcium: 8.9 mg/dL (ref 8.9–10.3)
Chloride: 102 mmol/L (ref 98–111)
Creatinine, Ser: 0.93 mg/dL (ref 0.44–1.00)
GFR calc Af Amer: >60 mL/min (ref >60)
GFR calc non Af Amer: >60 mL/min (ref >60)
Glucose, Bld: 128 mg/dL — ABNORMAL HIGH (ref 70–99)
Potassium: 4.1 mmol/L (ref 3.5–5.1)
Sodium: 141 mmol/L (ref 135–145)

## 2018-08-22 LAB — CBC
HCT: 27.4 % — ABNORMAL LOW (ref 36.0–46.0)
Hemoglobin: 7.9 g/dL — ABNORMAL LOW (ref 12.0–15.0)
MCH: 27.7 pg (ref 26.0–34.0)
MCHC: 28.8 g/dL — ABNORMAL LOW (ref 30.0–36.0)
MCV: 96.1 fL (ref 80.0–100.0)
Platelets: 228 10*3/uL (ref 150–400)
RBC: 2.85 MIL/uL — ABNORMAL LOW (ref 3.87–5.11)
RDW: 16.1 % — ABNORMAL HIGH (ref 11.5–15.5)
WBC: 8 10*3/uL (ref 4.0–10.5)
nRBC: 0 % (ref 0.0–0.2)

## 2018-08-22 LAB — GLUCOSE, CAPILLARY
Glucose-Capillary: 133 mg/dL — ABNORMAL HIGH (ref 70–99)
Glucose-Capillary: 128 mg/dL — ABNORMAL HIGH (ref 70–99)
Glucose-Capillary: 120 mg/dL — ABNORMAL HIGH (ref 70–99)
Glucose-Capillary: 119 mg/dL — ABNORMAL HIGH (ref 70–99)

## 2018-08-22 NOTE — Anesthesia Postprocedure Evaluation (Signed)
Anesthesia Post Note  Patient: Pamela Thomas  Procedure(s) Performed: CORONARY ARTERY BYPASS GRAFTING (CABG) TIMES TWO: LIMA to LAD, SVG to RAMUS INTERMEDIATE)  USING LEFT INTERNAL MAMMARY ARTERY AND RIGHT GREATER SAPHENOUS VEIN HARVESTED ENDOSCOPICALLY. (N/A Chest) TRANSESOPHAGEAL ECHOCARDIOGRAM (TEE) (N/A )     Patient location during evaluation: SICU Anesthesia Type: General Level of consciousness: sedated Pain management: pain level controlled Vital Signs Assessment: post-procedure vital signs reviewed and stable Respiratory status: patient remains intubated per anesthesia plan Cardiovascular status: stable Postop Assessment: no apparent nausea or vomiting Anesthetic complications: no    Last Vitals:  Vitals:   08/22/18 1200 08/22/18 1300  BP: 124/68 118/65  Pulse: 75 80  Resp: 15 16  Temp:    SpO2: 98% 91%    Last Pain:  Vitals:   08/22/18 1130  TempSrc:   PainSc: 0-No pain                 Susa Bones S

## 2018-08-22 NOTE — Progress Notes (Signed)
Patient was educated about the importance of wearing a BIPAP last night. Patient refused and declined further education. Patient is on 2L Nasal Cannula. Patient appears to be resting comfortably. Will continue to monitor for any changes.

## 2018-08-22 NOTE — Discharge Instructions (Signed)
Discharge Instructions:  1. You may shower, please wash incisions daily with soap and water and keep dry.  If you wish to cover wounds with dressing you may do so but please keep clean and change daily.  No tub baths or swimming until incisions have completely healed.  If your incisions become red or develop any drainage please call our office at [phone]  2. No Driving until cleared by Dr. Everrett Coombe office and you are no longer using narcotic pain medications  3. Monitor your weight daily.. Please use the same scale and weigh at same time... If you gain 5-10 lbs in 48 hours with associated lower extremity swelling, please contact our office at [phone]  4. Fever of 101.5 for at least 24 hours with no source, please contact our office at [phone]  5. Activity- up as tolerated, please walk at least 3 times per day.  Avoid strenuous activity, no lifting, pushing, or pulling with your arms over 8-10 lbs for a minimum of 6 weeks  6. If any questions or concerns arise, please do not hesitate to contact our office at [phone]   Coronary Artery Bypass Grafting, Care After These instructions give you information on caring for yourself after your procedure. Your doctor may also give you more specific instructions. Call your doctor if you have any problems or questions after your procedure. Follow these instructions at home:  Only take medicine as told by your doctor. Take medicines exactly as told. Do not stop taking medicines or start any new medicines without talking to your doctor first.  Take your pulse as told by your doctor.  Do deep breathing as told by your doctor. Use your breathing device (incentive spirometer), if given, to practice deep breathing several times a day. Support your chest with a pillow or your arms when you take deep breaths or cough.  Keep the area clean, dry, and protected where the surgery cuts (incisions) were made. Remove bandages (dressings) only as  told by your doctor. If strips were applied to surgical area, do not take them off. They fall off on their own.  Check the surgery area daily for puffiness (swelling), redness, or leaking fluid.  If surgery cuts were made in your legs: ? Avoid crossing your legs. ? Avoid sitting for long periods of time. Change positions every 30 minutes. ? Raise your legs when you are sitting. Place them on pillows.  Wear stockings that help keep blood clots from forming in your legs (compression stockings).  Only take sponge baths until your doctor says it is okay to take showers. Pat the surgery area dry. Do not rub the surgery area with a washcloth or towel. Do not bathe, swim, or use a hot tub until your doctor says it is okay.  Eat foods that are high in fiber. These include raw fruits and vegetables, whole grains, beans, and nuts. Choose lean meats. Avoid canned, processed, and fried foods.  Drink enough fluids to keep your pee (urine) clear or pale yellow.  Weigh yourself every day.  Rest and limit activity as told by your doctor. You may be told to: ? Stop any activity if you have chest pain, shortness of breath, changes in heartbeat, or dizziness. Get help right away if this happens. ? Move around often for short amounts of time or take short walks as told by your doctor. Gradually become more active. You may need help to strengthen your muscles and build endurance. ? Avoid lifting, pushing, or pulling anything  heavier than 10 pounds (4.5 kg) for at least 6 weeks after surgery.  Do not drive until your doctor says it is okay.  Ask your doctor when you can go back to work.  Ask your doctor when you can begin sexual activity again.  Follow up with your doctor as told. Contact a doctor if:  You have puffiness, redness, more pain, or fluid draining from the incision site.  You have a fever.  You have puffiness in your ankles or legs.  You have pain in your legs.  You gain 2 or more  pounds (0.9 kg) a day.  You feel sick to your stomach (nauseous) or throw up (vomit).  You have watery poop (diarrhea). Get help right away if:  You have chest pain that goes to your jaw or arms.  You have shortness of breath.  You have a fast or irregular heartbeat.  You notice a "clicking" in your breastbone when you move.  You have numbness or weakness in your arms or legs.  You feel dizzy or light-headed. This information is not intended to replace advice given to you by your health care provider. Make sure you discuss any questions you have with your health care provider. Document Released: 09/03/2013 Document Revised: 02/04/2016 Document Reviewed: 02/05/2013 Elsevier Interactive Patient Education  2017 [employer].

## 2018-08-22 NOTE — Discharge Summary (Signed)
Physician Discharge Summary       [place]       [place], [address]             [phone]    Patient ID: Pamela Thomas MRN: [id number] DOB/AGE: 63-22-1957 63 y.o.  Admit date: 08/13/2018 Discharge date: 08/26/2018  Admission Diagnoses: 1. S/p STEMI 2. CAD (coronary artery disease)  Discharge Diagnoses:  1. S/p CABG x 2 2. ABL anemia 3. History of dyslipidemia 4. History of diabetes mellitus without complication ([place]) 5. History of obesity 6. History of hypertension 7. History of osteoarthritis 8. History of planta fascitis  9. History of vertigo 10. History of allergic rhinitis  Procedure (s):   Coronary artery bypass grafting x2 with the left internal mammary to the left anterior descending coronary artery and reverse saphenous vein graft to the intermediate coronary artery with right greater saphenous thigh endoscopic vein  harvesting.  History of Presenting Illness: Pamela Thomas 62 y.o. female is seen in the office for evaluation for coronary artery bypass grafting.  The patient noted the 2 to 3 months ago she began having trouble sleeping, increasing shortness of breath, orthopnea, and aching in her jaw.  These symptoms culminated in an acute inferior myocardial infarction on November 4 while she was in [place].  At the time emergency cardiac catheterization showed a totally occluded right coronary artery.  This was treated with angioplasty and stent placement, see full note below.  In addition she was noted to have significant LAD diagonal disease and a very small nondominant circumflex.  She was discharged home from [facility] on Effient and referred to Dr. Percival Spanish.   She notes since the acute intervention she "feels like new", the sleeping difficulty has improved, she denies angina or orthopnea.  She she does note that her activity level has not been as high on purpose.  The patient has a history of diabetes mellitus for the past 8 years  currently on metformin.  She has a distant history of cigarette use but quit in 1987 when her father was diagnosed with lung cancer.    She  has been admitted to transition off [place]  Dr. Servando Snare discussed the need for coronary artery bypass grafting surgery. Potential risks, benefits, and complications of the surgery were discussed with the patient and she agreed to proceed with surgery. Pre operative carotid duplex US showed a 40-59% bilaterla stenosis of the internal carotid arteries.  Brief Hospital Course: The patient was admitted for transition off Effient started for recent placement of stents in the RCA with acute MI.  Cardiothoracic surgical consultation was obtained with Dr. Servando Snare who evaluated the patient and studies and recommended CABG.  On 08/17/2018 she was taken to the operating room where she underwent coronary artery bypass grafting x2 with left internal mammary artery to the LAD and a saphenous vein graft to the intermediate.  She tolerated this procedure well and was taken to the surgical intensive care unit in stable condition.  Postoperative hospital course:  Patient is overall done quite well.  She has maintained stable hemodynamics.  She has maintained normal sinus rhythm.  She was extubated without difficulties using standard protocols.  Her blood sugars were managed in the usual protocol with transition to metformin at time of discharge.  All routine lines, monitors and drainage devices were discontinued in the standard fashion.  Did require an aggressive diuresis for postoperative volume overload.  She has a mild expected acute blood loss anemia which  is stable.  She has had some postoperative tachycardia and blood pressure elevations and medications were adjusted over time.  Visions were noted to be healing well without evidence of infection.  She was tolerating diet.  She was tolerating gradually increasing activities using cardiac rehab protocols although somewhat slow in  her progression.  Oxygen was weaned and she maintains good saturations on room air.  At the time of discharge the patient is felt to be quite stable.     Latest Vital Signs: Blood pressure (!) 141/67, pulse 93, temperature 98.9 F (37.2 C), temperature source Oral, resp. rate (!) 25, height 5\' 2"  (1.575 m), weight 108.1 kg, last menstrual period 10/10/2010, SpO2 98 %.  Physical Exam: General appearance: alert, cooperative and no distress Heart: regular rate and rhythm Lungs: clear to auscultation bilaterally Abdomen: benign Extremities: + edema Wound: incis healing well   Discharge Condition: Stable and discharged to home.  Recent laboratory studies:  Lab Results  Component Value Date   WBC 8.9 08/25/2018   HGB 8.9 (L) 08/25/2018   HCT 30.2 (L) 08/25/2018   MCV 95.0 08/25/2018   PLT 283 08/25/2018   Lab Results  Component Value Date   NA 142 08/25/2018   K 3.8 08/25/2018   CL 98 08/25/2018   CO2 33 (H) 08/25/2018   CREATININE 0.78 08/25/2018   GLUCOSE 147 (H) 08/25/2018      Diagnostic Studies: Dg Chest 2 View  Result Date: 08/25/2018 CLINICAL DATA:  Status post coronary artery bypass graft. EXAM: CHEST - 2 VIEW COMPARISON:  Radiograph of August 23, 2018. FINDINGS: Stable cardiomegaly. No pneumothorax is noted. Right-sided PICC line is unchanged in position. Left lung is clear. Stable bibasilar atelectasis or infiltrate is noted with associated pleural effusion. Bony thorax is unremarkable. IMPRESSION: Stable bibasilar atelectasis or infiltrates with associated pleural effusions, right greater than left. Electronically Signed   By: Marijo Conception, M.D.   On: 08/25/2018 08:41   Dg Chest 2 View  Result Date: 08/13/2018 CLINICAL DATA:  Heart attack July 16, 2018. No chest complaints today. EXAM: CHEST - 2 VIEW COMPARISON:  October 05, 2012 FINDINGS: The heart size and mediastinal contours are within normal limits. Both lungs are clear. The visualized skeletal  structures are unremarkable. IMPRESSION: No active cardiopulmonary disease. Electronically Signed   By: Dorise Bullion III M.D   On: 08/13/2018 17:26   Dg Chest Port 1 View  Result Date: 08/23/2018 CLINICAL DATA:  Chest pain and shortness of breath EXAM: PORTABLE CHEST 1 VIEW COMPARISON:  08/22/2018 FINDINGS: Cardiac shadow remains enlarged. The lungs are well aerated bilaterally although small pleural effusions are seen right greater than left. These appear slightly more prominent than that noted on the prior exam although this may be projectional in nature. Right-sided PICC line is stable. Mild bibasilar atelectasis. No focal confluent infiltrate is seen. IMPRESSION: Stable appearance of bibasilar atelectasis and bilateral effusions. Electronically Signed   By: Inez Catalina M.D.   On: 08/23/2018 07:42   Dg Chest Port 1 View  Result Date: 08/22/2018 CLINICAL DATA:  Chest pain EXAM: PORTABLE CHEST 1 VIEW COMPARISON:  08/21/2018 FINDINGS: Cardiac shadow is enlarged but stable. Postsurgical changes are again seen. No pneumothorax is noted. Stable right-sided PICC line is noted. Mild bibasilar atelectasis is noted with small effusions stable from the prior exam. No new focal infiltrate is seen. IMPRESSION: Stable bibasilar atelectasis and effusions when compared with the prior exam. Right-sided PICC line in satisfactory position. No recurrent pneumothorax is  noted. Electronically Signed   By: Inez Catalina M.D.   On: 08/22/2018 09:40   Dg Chest Port 1 View  Result Date: 08/21/2018 CLINICAL DATA:  Status post chest tube removal. EXAM: PORTABLE CHEST 1 VIEW COMPARISON:  Radiograph of August 20, 2018. FINDINGS: Stable cardiomegaly. Status post coronary artery bypass graft. No pneumothorax is noted. Right-sided PICC line is unchanged in position. Right internal jugular venous sheath has been removed. Left-sided chest tube has been removed. Stable bibasilar atelectasis or edema is noted with associated  pleural effusions. Bony thorax is unremarkable. IMPRESSION: No pneumothorax status post left-sided chest tube removal. Stable bibasilar opacities as described above. Electronically Signed   By: Marijo Conception, M.D.   On: 08/21/2018 09:07   Dg Chest Port 1 View  Result Date: 08/20/2018 CLINICAL DATA:  Chest tube, CABG EXAM: PORTABLE CHEST 1 VIEW COMPARISON:  08/19/2018 FINDINGS: Left chest tube remains in place, unchanged. Prior CABG. Cardiomegaly with vascular congestion and probable mild pulmonary edema. Bibasilar atelectasis and small layering effusions. IMPRESSION: Probable mild pulmonary edema. Small effusions with bibasilar atelectasis. Left chest tube remains in stable position.  No pneumothorax. Electronically Signed   By: Rolm Baptise M.D.   On: 08/20/2018 08:42   Dg Chest Port 1 View  Result Date: 08/19/2018 CLINICAL DATA:  Postop from CABG. EXAM: PORTABLE CHEST 1 VIEW COMPARISON:  08/18/2018 FINDINGS: Left chest tube, mediastinal drain, and right jugular Cordis remains in place. No pneumothorax visualized. Stable cardiomegaly. Bibasilar atelectasis shows no significant change. IMPRESSION: Bibasilar atelectasis, without significant change. No pneumothorax visualized. Electronically Signed   By: Earle Gell M.D.   On: 08/19/2018 09:06   Dg Chest Port 1 View  Result Date: 08/18/2018 CLINICAL DATA:  CABG. EXAM: PORTABLE CHEST 1 VIEW COMPARISON:  Chest x-ray from yesterday. FINDINGS: Interval removal of the endotracheal and enteric tubes, as well as the Swan-Ganz catheter. Unchanged right internal jugular sheath and mediastinal and left chest tubes. Stable cardiomediastinal silhouette status post CABG. Normal pulmonary vascularity. Unchanged mild bibasilar atelectasis and probable small left pleural effusion. No pneumothorax. No acute osseous abnormality. IMPRESSION: Status post CABG with unchanged mild bibasilar atelectasis and probable small left pleural effusion. Electronically Signed   By:  Titus Dubin M.D.   On: 08/18/2018 10:05   Dg Chest Port 1 View  Result Date: 08/17/2018 CLINICAL DATA:  62 year old female with history of CABG EXAM: PORTABLE CHEST 1 VIEW COMPARISON:  08/13/2018 FINDINGS: Cardiomediastinal silhouette unchanged. Mild widening of the upper mediastinum, status post median sternotomy. Median sternotomy, changes of CABG. Endotracheal tube terminates suitably above the carina, 3.5 cm. Gastric tube projects over the midline, terminating out of the field of view. Mediastinal and pleural drains, with left chest tube terminating towards the apex. Interval placement of right IJ sheath through which a Swan-Ganz catheter terminates in the right pulmonary artery. No pneumothorax. Hazy opacities at the lung bases. IMPRESSION: Early surgical changes of median sternotomy and CABG, with atelectasis at the lung bases and possible small left pleural fluid. Endotracheal tube terminates suitably above the carina. Gastric tube terminates out of the field of view. Right IJ sheath transmits a Swan-Ganz catheter, terminating in the right pulmonary artery. No visualized pneumothorax with left chest tube terminating towards the apex. Electronically Signed   By: Corrie Mckusick D.O.   On: 08/17/2018 13:22   Vas US Doppler Pre Cabg  Result Date: 08/15/2018 PREOPERATIVE VASCULAR EVALUATION  Indications: Pre CABG. Performing Technologist: Landry Mellow RDMS, RVT  Examination Guidelines: A complete evaluation  includes B-mode imaging, spectral Doppler, color Doppler, and power Doppler as needed of all accessible portions of each vessel. Bilateral testing is considered an integral part of a complete examination. Limited examinations for reoccurring indications may be performed as noted.  Right Carotid Findings: +----------+--------+--------+--------+------------+--------+           PSV cm/sEDV cm/sStenosisDescribe    Comments +----------+--------+--------+--------+------------+--------+ CCA Prox  96       13                                   +----------+--------+--------+--------+------------+--------+ CCA Distal66      12                                   +----------+--------+--------+--------+------------+--------+ ICA Prox  141     44      40-59%  heterogenous         +----------+--------+--------+--------+------------+--------+ ICA Mid   109     29                                   +----------+--------+--------+--------+------------+--------+ ICA Distal83      20                                   +----------+--------+--------+--------+------------+--------+ ECA       73      4                                    +----------+--------+--------+--------+------------+--------+ Portions of this table do not appear on this page. +----------+--------+-------+----------------+------------+           PSV cm/sEDV cmsDescribe        Arm Pressure +----------+--------+-------+----------------+------------+ Subclavian161            Multiphasic, [id number]          +----------+--------+-------+----------------+------------+ +---------+--------+--+--------+--+---------+ VertebralPSV cm/s60EDV cm/s17Antegrade +---------+--------+--+--------+--+---------+ Left Carotid Findings: +----------+--------+--------+--------+-----------+------------------+           PSV cm/sEDV cm/sStenosisDescribe   Comments           +----------+--------+--------+--------+-----------+------------------+ CCA Prox  113     22                                            +----------+--------+--------+--------+-----------+------------------+ CCA Distal77      22                                            +----------+--------+--------+--------+-----------+------------------+ ICA Prox  177     53      40-59%  homogeneousupper end of range +----------+--------+--------+--------+-----------+------------------+ ICA Mid   126     32                                             +----------+--------+--------+--------+-----------+------------------+ ICA Distal135     31                                            +----------+--------+--------+--------+-----------+------------------+  ECA       149     12                                            +----------+--------+--------+--------+-----------+------------------+ +----------+--------+--------+----------------+------------+ SubclavianPSV cm/sEDV cm/sDescribe        Arm Pressure +----------+--------+--------+----------------+------------+           184             Multiphasic, [id number]          +----------+--------+--------+----------------+------------+ +---------+--------+--+--------+--+---------+ VertebralPSV cm/s78EDV cm/s17Antegrade +---------+--------+--+--------+--+---------+  ABI Findings: +--------+------------------+-----+---------+--------+ Right   Rt Pressure (mmHg)IndexWaveform Comment  +--------+------------------+-----+---------+--------+ [id number]                    triphasic         +--------+------------------+-----+---------+--------+ ATA     124               0.98 triphasic         +--------+------------------+-----+---------+--------+ PTA     120               0.95 triphasic         +--------+------------------+-----+---------+--------+ +--------+------------------+-----+---------+-------+ Left    Lt Pressure (mmHg)IndexWaveform Comment +--------+------------------+-----+---------+-------+ [id number]                    triphasic        +--------+------------------+-----+---------+-------+ ATA     117               0.93 triphasic        +--------+------------------+-----+---------+-------+ PTA     131               1.04 triphasic        +--------+------------------+-----+---------+-------+                                                 +--------+------------------+-----+---------+-------+  Right Doppler Findings:  +--------+--------+-----+---------+--------+ Site    PressureIndexDoppler  Comments +--------+--------+-----+---------+--------+ [id number]          triphasic         +--------+--------+-----+---------+--------+ Radial               triphasic         +--------+--------+-----+---------+--------+ Ulnar                biphasic          +--------+--------+-----+---------+--------+  Left Doppler Findings: +--------+--------+-----+---------+--------+ Site    PressureIndexDoppler  Comments +--------+--------+-----+---------+--------+ [id number]          triphasic         +--------+--------+-----+---------+--------+ Radial               triphasic         +--------+--------+-----+---------+--------+ Ulnar                triphasic         +--------+--------+-----+---------+--------+  Summary: Right Carotid: Velocities in the right ICA are consistent with a 40-59%                stenosis. Left Carotid: Velocities in the left ICA are consistent with a 40-59% stenosis. Vertebrals: Bilateral vertebral arteries demonstrate antegrade flow. Right ABI: Resting right ankle-brachial index is within normal range. No evidence of significant right lower  extremity arterial disease. Left ABI: Resting left ankle-brachial index is within normal range. No evidence of significant left lower extremity arterial disease. Right Upper Extremity: Doppler waveforms remain within normal limits with right radial compression. Doppler waveforms remain within normal limits with right ulnar compression. Left Upper Extremity: Doppler waveforms decrease >50% with left radial compression. Doppler waveforms decrease >50% with left ulnar compression.  Electronically signed by Quay Burow MD on 08/15/2018 at 4:47:35 PM.    Final    [name] Ekg Site Rite  Result Date: 08/20/2018 If Site Rite image not attached, placement could not be confirmed due to current cardiac rhythm.      Discharge Instructions     Amb Referral to Cardiac Rehabilitation   Complete by:  As directed    Diagnosis:  CABG   CABG X ___:  2   Discharge patient   Complete by:  As directed    Discharge disposition:  01-Home or Self Care   Discharge patient date:  08/26/2018      Discharge Medications: Allergies as of 08/26/2018      Reactions   Oxycodone Nausea Only   Has to take nausea medications if taken. Prefer not to take      Medication List    STOP taking these medications   Icosapent Ethyl 1 g Caps     TAKE these medications   acetaminophen 325 MG tablet Commonly known as:  TYLENOL Take 2 tablets (650 mg total) by mouth every 6 (six) hours as needed for mild pain.   amLODipine 10 MG tablet Commonly known as:  NORVASC Take 1 tablet (10 mg total) by mouth daily.   aspirin 81 MG tablet Take 1 tablet (81 mg total) by mouth daily. What changed:  when to take this   atorvastatin 80 MG tablet Commonly known as:  LIPITOR Take 80 mg by mouth every evening.   candesartan 32 MG tablet Commonly known as:  ATACAND Take 1 tablet (32 mg total) by mouth daily. What changed:  when to take this   FLUoxetine 20 MG tablet Commonly known as:  PROZAC TAKE 1 TABLET BY MOUTH EVERY DAY What changed:  when to take this   furosemide 20 MG tablet Commonly known as:  LASIX Take 1 tablet (20 mg total) by mouth 2 (two) times daily. Patient does not want to split a 40 mg  Tab so please do not substitute   Melatonin 3 MG Tabs Take 3-6 mg by mouth at bedtime as needed (sleep).   metFORMIN 1000 MG tablet Commonly known as:  GLUCOPHAGE Take 1 tablet (1,000 mg total) by mouth 2 (two) times daily with a meal.   metoprolol succinate 50 MG 24 hr tablet Commonly known as:  TOPROL-XL TAKE 1 TABLET BY MOUTH TWICE A DAY   omega-3 acid ethyl esters 1 g capsule Commonly known as:  LOVAZA Take 2 capsules (2 g total) by mouth 2 (two) times daily.   potassium chloride SA 20 MEQ tablet Commonly known as:  KLOR-CON M20 Take  1 tablet (20 mEq total) by mouth daily. What changed:  when to take this   prasugrel 10 MG Tabs tablet Commonly known as:  EFFIENT Take 10 mg by mouth daily.   traMADol 50 MG tablet Commonly known as:  ULTRAM Take 1 tablet (50 mg total) by mouth every 6 (six) hours as needed for up to 7 days for moderate pain.      The patient has been discharged on:   1.Beta Blocker:  Yes [ x  ]                              No   [   ]                              If No, reason:  2.Ace Inhibitor/ARB: Yes [ x  ]                                     No  [    ]                                     If No, reason:  3.Statin:   Yes [ x  ]                  No  [   ]                  If No, reason:  4.Ecasa:  Yes  [x   ]                  No   [   ]                  If No, reason:  Follow Up Appointments: Follow-up Information    [name], MD. Go on 09/27/2018.   Specialty:  Cardiothoracic Surgery Why:  PA/LAT CXR to be taken (at [facility] which is in the same buidling as Dr. Everrett Coombe office) on 09/27/2018 at 12:30 pm;Appointment time is at 1:00 pm Contact information: [place] [address] [phone]        Lendon Colonel, NP. Go on 09/04/2018.   Specialties:  Nurse Practitioner, Radiology, Cardiology Why:  Appointment time is at 9:00 am  Contact information: [address] [place] [address] [phone]          Signed: Gaspar Bidding 08/26/2018, 10:28 AM

## 2018-08-22 NOTE — Progress Notes (Signed)
Patient ID: Pamela Thomas, female   DOB: 1956-08-22, 62 y.o.   MRN: [id number] TCTS DAILY ICU PROGRESS NOTE                   [address]            [place] [address]          [phone]   5 Days Post-Op Procedure(s) (LRB): CORONARY ARTERY BYPASS GRAFTING (CABG) TIMES TWO: LIMA to LAD, SVG to RAMUS INTERMEDIATE)  USING LEFT INTERNAL MAMMARY ARTERY AND RIGHT GREATER SAPHENOUS VEIN HARVESTED ENDOSCOPICALLY. (N/A) TRANSESOPHAGEAL ECHOCARDIOGRAM (TEE) (N/A)  Total Length of Stay:  LOS: 9 days   Subjective: Overall feels better this morning, more alert and talkative, walked around the unit  Objective: Vital signs in last 24 hours: Temp:  [97.8 F (36.6 C)-98.6 F (37 C)] 97.8 F (36.6 C) (12/11 0650) Pulse Rate:  [74-100] 83 (12/11 0700) Cardiac Rhythm: Normal sinus rhythm (12/11 0730) Resp:  [13-23] 14 (12/11 0700) BP: (102-168)/(52-84) 119/65 (12/11 0700) SpO2:  [91 %-100 %] 99 % (12/11 0700) FiO2 (%):  [40 %] 40 % (12/10 1400) Weight:  [119 kg] 112 kg (12/11 0500)  Filed Weights   08/20/18 0500 08/21/18 0500 08/22/18 0500  Weight: 114.9 kg 112.7 kg 112 kg    Weight change: -0.7 kg   Hemodynamic parameters for last 24 hours:    Intake/Output from previous day: 12/10 0701 - 12/11 0700 In: 1700 [P.O.:420; I.V.:1280] Out: 1800 [Urine:1800]  Intake/Output this shift: No intake/output data recorded.  Current Meds: Scheduled Meds: . acetaminophen  1,000 mg Oral Q6H   Or  . acetaminophen (TYLENOL) oral liquid 160 mg/5 mL  1,000 mg Per Tube Q6H  . aspirin EC  81 mg Oral Daily  . atorvastatin  80 mg Oral q1800  . bisacodyl  10 mg Oral Daily   Or  . bisacodyl  10 mg Rectal Daily  . Chlorhexidine Gluconate Cloth  6 each Topical Daily  . docusate sodium  200 mg Oral Daily  . enoxaparin (LOVENOX) injection  40 mg Subcutaneous QHS  . FLUoxetine  20 mg Oral Daily  . furosemide  40 mg Oral Daily  . insulin aspart  0-20 Units Subcutaneous TID WC  .  insulin aspart  0-5 Units Subcutaneous QHS  . irbesartan  300 mg Oral Daily  . mouth rinse  15 mL Mouth Rinse BID  . metFORMIN  1,000 mg Oral BID WC  . metoprolol tartrate  25 mg Oral BID   Or  . metoprolol tartrate  25 mg Per Tube BID  . omega-3 acid ethyl esters  2 g Oral BID  . pantoprazole  40 mg Oral Daily  . potassium chloride  20 mEq Oral Daily  . prasugrel  10 mg Oral Daily  . sodium chloride flush  10-40 mL Intracatheter Q12H  . sodium chloride flush  3 mL Intravenous Q12H   Continuous Infusions: . sodium chloride Stopped (08/18/18 0814)  . sodium chloride    . sodium chloride 20 mL/hr at 08/17/18 1326  . dexmedetomidine (PRECEDEX) IV infusion Stopped (08/17/18 1506)  . lactated ringers    . lactated ringers Stopped (08/18/18 0311)  . lactated ringers Stopped (08/18/18 1739)  . nitroGLYCERIN Stopped (08/17/18 1334)  . phenylephrine (NEO-SYNEPHRINE) Adult infusion Stopped (08/18/18 0729)   PRN Meds:.sodium chloride, lactated ringers, Melatonin, metoprolol tartrate, midazolam, naLOXone (NARCAN)  injection, ondansetron (ZOFRAN) IV, sodium chloride flush, sodium chloride flush, traMADol  General appearance: alert,  cooperative and no distress Neurologic: intact Heart: regular rate and rhythm, S1, S2 normal, no murmur, click, rub or gallop Lungs: diminished breath sounds bibasilar Abdomen: soft, non-tender; bowel sounds normal; no masses,  no organomegaly Extremities: extremities normal, atraumatic, no cyanosis or edema and Homans sign is negative, no sign of DVT Wound: Incisional wound VAC in place  Lab Results: CBC: Recent Labs    08/21/18 0411 08/22/18 0535  WBC 8.6 8.0  HGB 8.1* 7.9*  HCT 28.6* 27.4*  PLT 206 228   BMET:  Recent Labs    08/21/18 0411 08/22/18 0535  NA 140 141  K 4.5 4.1  CL 102 102  CO2 29 30  GLUCOSE 130* 128*  BUN 23 22  CREATININE 1.06* 0.93  CALCIUM 8.7* 8.9    CMET: Lab Results  Component Value Date   WBC 8.0 08/22/2018    HGB 7.9 (L) 08/22/2018   HCT 27.4 (L) 08/22/2018   PLT 228 08/22/2018   GLUCOSE 128 (H) 08/22/2018   CHOL 220 (H) 05/11/2011   TRIG 576 (H) 05/11/2011   HDL 40 05/11/2011   LDLDIRECT 123 (H) 01/24/2018   [place]  05/11/2011     Comment:       Not calculated due to Triglyceride >400. Suggest ordering Direct LDL (Unit Code: [phone]).   Total Cholesterol/HDL Ratio:CHD Risk                        Coronary Heart Disease Risk Table                                        Men       Women          1/2 Average Risk              3.4        3.3              Average Risk              5.0        4.4           2X Average Risk              9.6        7.1           3X Average Risk             23.4       11.0 Use the calculated Patient Ratio above and the CHD Risk table  to determine the patient's CHD Risk. ATP III Classification (LDL):       < 100        mg/dL         Optimal      100 - 129     mg/dL         Near or Above Optimal      130 - 159     mg/dL         Borderline High      160 - 189     mg/dL         High       > 190        mg/dL         Very High     ALT 21 08/13/2018   AST 21 08/13/2018  NA 141 08/22/2018   K 4.1 08/22/2018   CL 102 08/22/2018   CREATININE 0.93 08/22/2018   BUN 22 08/22/2018   CO2 30 08/22/2018   TSH 2.136 08/13/2018   INR 1.41 08/17/2018   HGBA1C 6.2 (H) 08/13/2018      PT/INR: No results for input(s): LABPROT, INR in the last 72 hours. Radiology: No results found. Today's x-ray not read yet shows improved aeration decreased effusion on the right  Assessment/Plan: S/P Procedure(s) (LRB): CORONARY ARTERY BYPASS GRAFTING (CABG) TIMES TWO: LIMA to LAD, SVG to RAMUS INTERMEDIATE)  USING LEFT INTERNAL MAMMARY ARTERY AND RIGHT GREATER SAPHENOUS VEIN HARVESTED ENDOSCOPICALLY. (N/A) TRANSESOPHAGEAL ECHOCARDIOGRAM (TEE) (N/A) Mobilize Diuresis Patient improving, decreased edema Neurologically intact, to stepdown soon    [name] 08/22/2018 8:18  AM

## 2018-08-22 NOTE — Progress Notes (Signed)
TCTS BRIEF SICU PROGRESS NOTE  5 Days Post-Op  S/P Procedure(s) (LRB): CORONARY ARTERY BYPASS GRAFTING (CABG) TIMES TWO: LIMA to LAD, SVG to RAMUS INTERMEDIATE)  USING LEFT INTERNAL MAMMARY ARTERY AND RIGHT GREATER SAPHENOUS VEIN HARVESTED ENDOSCOPICALLY. (N/A) TRANSESOPHAGEAL ECHOCARDIOGRAM (TEE) (N/A)   Stable day  Plan: Continue current plan  Rexene Alberts, MD 08/22/2018 7:41 PM

## 2018-08-22 NOTE — Progress Notes (Signed)
Patient declined to wear the bipap for the night. Sp02=97% on 2lpm, will continue to monitor patient.

## 2018-08-23 ENCOUNTER — Inpatient Hospital Stay (HOSPITAL_COMMUNITY): Payer: BLUE CROSS/BLUE SHIELD

## 2018-08-23 LAB — CBC
HCT: 30 % — ABNORMAL LOW (ref 36.0–46.0)
Hemoglobin: 8.6 g/dL — ABNORMAL LOW (ref 12.0–15.0)
MCH: 27.3 pg (ref 26.0–34.0)
MCHC: 28.7 g/dL — ABNORMAL LOW (ref 30.0–36.0)
MCV: 95.2 fL (ref 80.0–100.0)
Platelets: 288 10*3/uL (ref 150–400)
RBC: 3.15 MIL/uL — ABNORMAL LOW (ref 3.87–5.11)
RDW: 16 % — ABNORMAL HIGH (ref 11.5–15.5)
WBC: 8 10*3/uL (ref 4.0–10.5)
nRBC: 0.3 % — ABNORMAL HIGH (ref 0.0–0.2)

## 2018-08-23 LAB — BASIC METABOLIC PANEL
Anion gap: 10 (ref 5–15)
BUN: 18 mg/dL (ref 8–23)
CO2: 31 mmol/L (ref 22–32)
Calcium: 9.3 mg/dL (ref 8.9–10.3)
Chloride: 102 mmol/L (ref 98–111)
Creatinine, Ser: 0.88 mg/dL (ref 0.44–1.00)
GFR calc Af Amer: >60 mL/min (ref >60)
GFR calc non Af Amer: >60 mL/min (ref >60)
Glucose, Bld: 142 mg/dL — ABNORMAL HIGH (ref 70–99)
Potassium: 4.2 mmol/L (ref 3.5–5.1)
Sodium: 143 mmol/L (ref 135–145)

## 2018-08-23 LAB — GLUCOSE, CAPILLARY
Glucose-Capillary: 181 mg/dL — ABNORMAL HIGH (ref 70–99)
Glucose-Capillary: 132 mg/dL — ABNORMAL HIGH (ref 70–99)
Glucose-Capillary: 126 mg/dL — ABNORMAL HIGH (ref 70–99)
Glucose-Capillary: 118 mg/dL — ABNORMAL HIGH (ref 70–99)

## 2018-08-23 MED ORDER — SODIUM CHLORIDE 0.9% FLUSH: 3.0000 mL | INTRAVENOUS | Status: DC | PRN

## 2018-08-23 MED ORDER — GUAIFENESIN ER 600 MG PO TB12: 600.0000 mg | ORAL_TABLET | Freq: Two times a day (BID) | ORAL | Status: DC | PRN

## 2018-08-23 MED ORDER — ONDANSETRON HCL 4 MG/2ML IJ SOLN
4.0000 mg | Freq: Four times a day (QID) | INTRAMUSCULAR | Status: DC | PRN
  Administered 2018-08-24: 4 mg via INTRAVENOUS
  Filled 2018-08-23: qty 2

## 2018-08-23 MED ORDER — BISACODYL 10 MG RE SUPP: 10.0000 mg | Freq: Every day | RECTAL | Status: DC | PRN

## 2018-08-23 MED ORDER — SODIUM CHLORIDE 0.9% FLUSH
3.0000 mL | Freq: Two times a day (BID) | INTRAVENOUS | Status: DC
  Administered 2018-08-23 – 2018-08-26 (×6): 3 mL via INTRAVENOUS

## 2018-08-23 MED ORDER — ONDANSETRON HCL 4 MG PO TABS: 4.0000 mg | ORAL_TABLET | Freq: Four times a day (QID) | ORAL | Status: DC | PRN

## 2018-08-23 MED ORDER — INSULIN ASPART 100 UNIT/ML ~~LOC~~ SOLN
0.0000 [IU] | Freq: Three times a day (TID) | SUBCUTANEOUS | Status: DC
  Administered 2018-08-23 (×2): 2 [IU] via SUBCUTANEOUS
  Administered 2018-08-24: 4 [IU] via SUBCUTANEOUS
  Administered 2018-08-24 (×3): 2 [IU] via SUBCUTANEOUS
  Administered 2018-08-25: 4 [IU] via SUBCUTANEOUS
  Administered 2018-08-25 – 2018-08-26 (×3): 2 [IU] via SUBCUTANEOUS

## 2018-08-23 MED ORDER — SODIUM CHLORIDE 0.9 % IV SOLN: 250.0000 mL | INTRAVENOUS | Status: DC | PRN

## 2018-08-23 MED ORDER — ACETAMINOPHEN 325 MG PO TABS: 650.0000 mg | ORAL_TABLET | Freq: Four times a day (QID) | ORAL | Status: DC | PRN

## 2018-08-23 MED ORDER — TRAMADOL HCL 50 MG PO TABS
50.0000 mg | ORAL_TABLET | Freq: Four times a day (QID) | ORAL | Status: DC | PRN
  Administered 2018-08-23 – 2018-08-24 (×2): 50 mg via ORAL
  Filled 2018-08-23 (×3): qty 1

## 2018-08-23 MED ORDER — METOPROLOL TARTRATE 12.5 MG HALF TABLET
12.5000 mg | ORAL_TABLET | Freq: Two times a day (BID) | ORAL | Status: DC
  Administered 2018-08-23 – 2018-08-24 (×4): 12.5 mg via ORAL
  Filled 2018-08-23 (×4): qty 1

## 2018-08-23 MED ORDER — BISACODYL 5 MG PO TBEC: 10.0000 mg | DELAYED_RELEASE_TABLET | Freq: Every day | ORAL | Status: DC | PRN

## 2018-08-23 MED ORDER — MOVING RIGHT ALONG BOOK
Freq: Once | Status: AC
  Administered 2018-08-23: 10:00:00
  Filled 2018-08-23: qty 1

## 2018-08-23 NOTE — Progress Notes (Signed)
Patient ID: Pamela Thomas, female   DOB: 11/11/1955, 62 y.o.   MRN: [id number] TCTS DAILY ICU PROGRESS NOTE                   [address]            [place] [address]          [phone]   6 Days Post-Op Procedure(s) (LRB): CORONARY ARTERY BYPASS GRAFTING (CABG) TIMES TWO: LIMA to LAD, SVG to RAMUS INTERMEDIATE)  USING LEFT INTERNAL MAMMARY ARTERY AND RIGHT GREATER SAPHENOUS VEIN HARVESTED ENDOSCOPICALLY. (N/A) TRANSESOPHAGEAL ECHOCARDIOGRAM (TEE) (N/A)  Total Length of Stay:  LOS: 10 days   Subjective: Patient continues to improve, he is now walking around the unit, alert  Objective: Vital signs in last 24 hours: Temp:  [97.9 F (36.6 C)-99 F (37.2 C)] 97.9 F (36.6 C) (12/12 0800) Pulse Rate:  [72-98] 98 (12/12 0800) Cardiac Rhythm: Normal sinus rhythm (12/12 0300) Resp:  [12-25] 24 (12/12 0800) BP: (107-180)/(60-109) 180/87 (12/12 0800) SpO2:  [90 %-100 %] 100 % (12/12 0800) Weight:  [546 kg] 109 kg (12/12 0456)  Filed Weights   08/21/18 0500 08/22/18 0500 08/23/18 0456  Weight: 112.7 kg 112 kg 109 kg    Weight change: -3 kg   Hemodynamic parameters for last 24 hours:    Intake/Output from previous day: 12/11 0701 - 12/12 0700 In: 360 [P.O.:360] Out: 600 [Urine:600]  Intake/Output this shift: No intake/output data recorded.  Current Meds: Scheduled Meds: . aspirin EC  81 mg Oral Daily  . atorvastatin  80 mg Oral q1800  . bisacodyl  10 mg Oral Daily   Or  . bisacodyl  10 mg Rectal Daily  . Chlorhexidine Gluconate Cloth  6 each Topical Daily  . docusate sodium  200 mg Oral Daily  . enoxaparin (LOVENOX) injection  40 mg Subcutaneous QHS  . FLUoxetine  20 mg Oral Daily  . furosemide  40 mg Oral Daily  . insulin aspart  0-20 Units Subcutaneous TID WC  . insulin aspart  0-5 Units Subcutaneous QHS  . irbesartan  300 mg Oral Daily  . mouth rinse  15 mL Mouth Rinse BID  . metFORMIN  1,000 mg Oral BID WC  . metoprolol tartrate  25 mg  Oral BID   Or  . metoprolol tartrate  25 mg Per Tube BID  . omega-3 acid ethyl esters  2 g Oral BID  . pantoprazole  40 mg Oral Daily  . potassium chloride  20 mEq Oral Daily  . prasugrel  10 mg Oral Daily  . sodium chloride flush  10-40 mL Intracatheter Q12H  . sodium chloride flush  3 mL Intravenous Q12H   Continuous Infusions: . sodium chloride Stopped (08/18/18 0814)  . sodium chloride    . sodium chloride Stopped (08/22/18 0800)  . dexmedetomidine (PRECEDEX) IV infusion Stopped (08/17/18 1506)  . lactated ringers    . lactated ringers Stopped (08/18/18 0311)  . lactated ringers Stopped (08/18/18 1739)  . nitroGLYCERIN Stopped (08/17/18 1334)  . phenylephrine (NEO-SYNEPHRINE) Adult infusion Stopped (08/18/18 0729)   PRN Meds:.sodium chloride, lactated ringers, Melatonin, metoprolol tartrate, midazolam, naLOXone (NARCAN)  injection, ondansetron (ZOFRAN) IV, sodium chloride flush, sodium chloride flush, traMADol  General appearance: alert and cooperative Neurologic: intact Heart: regular rate and rhythm, S1, S2 normal, no murmur, click, rub or gallop Lungs: diminished breath sounds bibasilar Abdomen: soft, non-tender; bowel sounds normal; no masses,  no organomegaly Extremities: extremities normal, atraumatic, no  cyanosis or edema and Homans sign is negative, no sign of DVT Wound: Incisional wound VAC in place plan to remove tomorrow  Lab Results: CBC: Recent Labs    08/22/18 0535 08/23/18 0457  WBC 8.0 8.0  HGB 7.9* 8.6*  HCT 27.4* 30.0*  PLT 228 288   BMET:  Recent Labs    08/22/18 0535 08/23/18 0457  NA 141 143  K 4.1 4.2  CL 102 102  CO2 30 31  GLUCOSE 128* 142*  BUN 22 18  CREATININE 0.93 0.88  CALCIUM 8.9 9.3    CMET: Lab Results  Component Value Date   WBC 8.0 08/23/2018   HGB 8.6 (L) 08/23/2018   HCT 30.0 (L) 08/23/2018   PLT 288 08/23/2018   GLUCOSE 142 (H) 08/23/2018   CHOL 220 (H) 05/11/2011   TRIG 576 (H) 05/11/2011   HDL 40 05/11/2011     LDLDIRECT 123 (H) 01/24/2018   [place]  05/11/2011     Comment:       Not calculated due to Triglyceride >400. Suggest ordering Direct LDL (Unit Code: [phone]).   Total Cholesterol/HDL Ratio:CHD Risk                        Coronary Heart Disease Risk Table                                        Men       Women          1/2 Average Risk              3.4        3.3              Average Risk              5.0        4.4           2X Average Risk              9.6        7.1           3X Average Risk             23.4       11.0 Use the calculated Patient Ratio above and the CHD Risk table  to determine the patient's CHD Risk. ATP III Classification (LDL):       < 100        mg/dL         Optimal      100 - 129     mg/dL         Near or Above Optimal      130 - 159     mg/dL         Borderline High      160 - 189     mg/dL         High       > 190        mg/dL         Very High     ALT 21 08/13/2018   AST 21 08/13/2018   NA 143 08/23/2018   K 4.2 08/23/2018   CL 102 08/23/2018   CREATININE 0.88 08/23/2018   BUN 18 08/23/2018   CO2 31 08/23/2018   TSH 2.136 08/13/2018  INR 1.41 08/17/2018   HGBA1C 6.2 (H) 08/13/2018      PT/INR: No results for input(s): LABPROT, INR in the last 72 hours. Radiology: Dg Chest Port 1 View  Result Date: 08/23/2018 CLINICAL DATA:  Chest pain and shortness of breath EXAM: PORTABLE CHEST 1 VIEW COMPARISON:  08/22/2018 FINDINGS: Cardiac shadow remains enlarged. The lungs are well aerated bilaterally although small pleural effusions are seen right greater than left. These appear slightly more prominent than that noted on the prior exam although this may be projectional in nature. Right-sided PICC line is stable. Mild bibasilar atelectasis. No focal confluent infiltrate is seen. IMPRESSION: Stable appearance of bibasilar atelectasis and bilateral effusions. Electronically Signed   By: Inez Catalina M.D.   On: 08/23/2018 07:42     Assessment/Plan: S/P  Procedure(s) (LRB): CORONARY ARTERY BYPASS GRAFTING (CABG) TIMES TWO: LIMA to LAD, SVG to RAMUS INTERMEDIATE)  USING LEFT INTERNAL MAMMARY ARTERY AND RIGHT GREATER SAPHENOUS VEIN HARVESTED ENDOSCOPICALLY. (N/A) TRANSESOPHAGEAL ECHOCARDIOGRAM (TEE) (N/A) Mobilize Diuresis Plan for transfer to step-down: see transfer orders Plan to remove wound VAC tomorrow   [name] 08/23/2018 8:25 AM

## 2018-08-23 NOTE — Plan of Care (Signed)
  Problem: Clinical Measurements: Goal: Ability to maintain clinical measurements within normal limits will improve Outcome: Progressing Goal: Will remain free from infection Outcome: Progressing Goal: Respiratory complications will improve Outcome: Progressing Goal: Cardiovascular complication will be avoided Outcome: Progressing   Problem: Activity: Goal: Risk for activity intolerance will decrease Outcome: Progressing Note:  Ambulated in the hallway x2 today.    Problem: Nutrition: Goal: Adequate nutrition will be maintained Outcome: Progressing Note:  Appetite improving, per patient.   Problem: Elimination: Goal: Will not experience complications related to bowel motility Outcome: Progressing Goal: Will not experience complications related to urinary retention Outcome: Progressing   Problem: Pain Managment: Goal: General experience of comfort will improve Outcome: Progressing   Problem: Safety: Goal: Ability to remain free from injury will improve Outcome: Progressing   Problem: Skin Integrity: Goal: Risk for impaired skin integrity will decrease Outcome: Progressing   Problem: Activity: Goal: Risk for activity intolerance will decrease Outcome: Progressing   Problem: Cardiac: Goal: Will achieve and/or maintain hemodynamic stability Outcome: Progressing   Problem: Clinical Measurements: Goal: Postoperative complications will be avoided or minimized Outcome: Progressing   Problem: Respiratory: Goal: Respiratory status will improve Outcome: Progressing   Problem: Skin Integrity: Goal: Risk for impaired skin integrity will decrease Outcome: Progressing   Problem: Urinary Elimination: Goal: Ability to achieve and maintain adequate renal perfusion and functioning will improve Outcome: Progressing

## 2018-08-23 NOTE — Progress Notes (Signed)
Pt received from [place]. VSS. Telemetry applied. Pt oriented to room and unit. Will continue to monitor.  Clyde Canterbury, RN

## 2018-08-24 LAB — BASIC METABOLIC PANEL
Anion gap: 11 (ref 5–15)
BUN: 15 mg/dL (ref 8–23)
CO2: 32 mmol/L (ref 22–32)
Calcium: 9.2 mg/dL (ref 8.9–10.3)
Chloride: 98 mmol/L (ref 98–111)
Creatinine, Ser: 0.89 mg/dL (ref 0.44–1.00)
GFR calc Af Amer: >60 mL/min (ref >60)
GFR calc non Af Amer: >60 mL/min (ref >60)
Glucose, Bld: 153 mg/dL — ABNORMAL HIGH (ref 70–99)
Potassium: 3.9 mmol/L (ref 3.5–5.1)
Sodium: 141 mmol/L (ref 135–145)

## 2018-08-24 LAB — GLUCOSE, CAPILLARY
Glucose-Capillary: 159 mg/dL — ABNORMAL HIGH (ref 70–99)
Glucose-Capillary: 147 mg/dL — ABNORMAL HIGH (ref 70–99)
Glucose-Capillary: 138 mg/dL — ABNORMAL HIGH (ref 70–99)
Glucose-Capillary: 156 mg/dL — ABNORMAL HIGH (ref 70–99)

## 2018-08-24 LAB — CBC
HCT: 29.6 % — ABNORMAL LOW (ref 36.0–46.0)
Hemoglobin: 8.9 g/dL — ABNORMAL LOW (ref 12.0–15.0)
MCH: 28 pg (ref 26.0–34.0)
MCHC: 30.1 g/dL (ref 30.0–36.0)
MCV: 93.1 fL (ref 80.0–100.0)
Platelets: 280 10*3/uL (ref 150–400)
RBC: 3.18 MIL/uL — ABNORMAL LOW (ref 3.87–5.11)
RDW: 15.7 % — ABNORMAL HIGH (ref 11.5–15.5)
WBC: 8.8 10*3/uL (ref 4.0–10.5)
nRBC: 0 % (ref 0.0–0.2)

## 2018-08-24 MED ORDER — AMLODIPINE BESYLATE 5 MG PO TABS
5.0000 mg | ORAL_TABLET | Freq: Every day | ORAL | Status: DC
  Administered 2018-08-24: 5 mg via ORAL
  Filled 2018-08-24: qty 1

## 2018-08-24 NOTE — Progress Notes (Addendum)
[address]       [place] [address]             [phone]      7 Days Post-Op Procedure(s) (LRB): CORONARY ARTERY BYPASS GRAFTING (CABG) TIMES TWO: LIMA to LAD, SVG to RAMUS INTERMEDIATE)  USING LEFT INTERNAL MAMMARY ARTERY AND RIGHT GREATER SAPHENOUS VEIN HARVESTED ENDOSCOPICALLY. (N/A) TRANSESOPHAGEAL ECHOCARDIOGRAM (TEE) (N/A) Subjective: "feels like I was hit by a truck", but inproving  Objective: Vital signs in last 24 hours: Temp:  [97.8 F (36.6 C)-99 F (37.2 C)] 98.4 F (36.9 C) (12/13 0809) Pulse Rate:  [78-94] 93 (12/13 0809) Cardiac Rhythm: Normal sinus rhythm (12/13 0809) Resp:  [17-43] 18 (12/13 0809) BP: (131-164)/(56-91) 155/88 (12/13 0809) SpO2:  [93 %-100 %] 98 % (12/13 0809) Weight:  [108.4 kg] 108.4 kg (12/13 0356)  Hemodynamic parameters for last 24 hours:    Intake/Output from previous day: 12/12 0701 - 12/13 0700 In: 480 [P.O.:480] Out: 302 [Urine:300; Stool:2] Intake/Output this shift: No intake/output data recorded.  General appearance: alert, cooperative and no distress Heart: regular rate and rhythm Lungs: clear to auscultation bilaterally Abdomen: obese , benign Extremities: + BLE edema Wound: provena in place, incis(evh) healing well  Lab Results: Recent Labs    08/23/18 0457 08/24/18 0400  WBC 8.0 8.8  HGB 8.6* 8.9*  HCT 30.0* 29.6*  PLT 288 280   BMET:  Recent Labs    08/23/18 0457 08/24/18 0400  NA 143 141  K 4.2 3.9  CL 102 98  CO2 31 32  GLUCOSE 142* 153*  BUN 18 15  CREATININE 0.88 0.89  CALCIUM 9.3 9.2    PT/INR: No results for input(s): LABPROT, INR in the last 72 hours. ABG    Component Value Date/Time   PHART 7.311 (L) 08/21/2018 0555   HCO3 28.3 (H) 08/21/2018 0555   TCO2 28 08/20/2018 1725   ACIDBASEDEF 2.0 08/20/2018 1725   O2SAT 99.5 08/21/2018 0555   CBG (last 3)  Recent Labs    08/23/18 1632 08/23/18 2052 08/24/18 0558  GLUCAP 126* 118* 159*    Meds Scheduled  Meds: . aspirin EC  81 mg Oral Daily  . atorvastatin  80 mg Oral q1800  . Chlorhexidine Gluconate Cloth  6 each Topical Daily  . enoxaparin (LOVENOX) injection  40 mg Subcutaneous QHS  . FLUoxetine  20 mg Oral Daily  . furosemide  40 mg Oral Daily  . insulin aspart  0-24 Units Subcutaneous TID AC & HS  . irbesartan  300 mg Oral Daily  . mouth rinse  15 mL Mouth Rinse BID  . metFORMIN  1,000 mg Oral BID WC  . metoprolol tartrate  12.5 mg Oral BID  . omega-3 acid ethyl esters  2 g Oral BID  . potassium chloride  20 mEq Oral Daily  . prasugrel  10 mg Oral Daily  . sodium chloride flush  10-40 mL Intracatheter Q12H  . sodium chloride flush  3 mL Intravenous Q12H   Continuous Infusions: . sodium chloride     PRN Meds:.sodium chloride, acetaminophen, bisacodyl **OR** bisacodyl, guaiFENesin, Melatonin, ondansetron **OR** ondansetron (ZOFRAN) IV, sodium chloride flush, sodium chloride flush, traMADol  Xrays Dg Chest Port 1 View  Result Date: 08/23/2018 CLINICAL DATA:  Chest pain and shortness of breath EXAM: PORTABLE CHEST 1 VIEW COMPARISON:  08/22/2018 FINDINGS: Cardiac shadow remains enlarged. The lungs are well aerated bilaterally although small pleural effusions are seen right greater than left. These  appear slightly more prominent than that noted on the prior exam although this may be projectional in nature. Right-sided PICC line is stable. Mild bibasilar atelectasis. No focal confluent infiltrate is seen. IMPRESSION: Stable appearance of bibasilar atelectasis and bilateral effusions. Electronically Signed   By: Inez Catalina M.D.   On: 08/23/2018 07:42    Assessment/Plan: S/P Procedure(s) (LRB): CORONARY ARTERY BYPASS GRAFTING (CABG) TIMES TWO: LIMA to LAD, SVG to RAMUS INTERMEDIATE)  USING LEFT INTERNAL MAMMARY ARTERY AND RIGHT GREATER SAPHENOUS VEIN HARVESTED ENDOSCOPICALLY. (N/A) TRANSESOPHAGEAL ECHOCARDIOGRAM (TEE) (N/A)  1 steady progress 2 hypertensive on ARB, add norvasc,  sinus rhythm with PVC's 3 sugars adeq controlled for inpatient, needs long term management of severe metabolic syndrome 4 renal fxn in normal range, cont diuretic for volume overload 5 CBC stable 6 d/c provena and EPW's today 7 poss home 1-2 days   LOS: 11 days    Pamela Giovanni PA-C 08/24/2018 Pager [phone]  Poss home Sunday or Monday  I have seen and examined Pamela Thomas and agree with the above assessment  and plan.  [name] MD [facility] [phone] Office [phone] 08/24/2018 1:16 PM

## 2018-08-24 NOTE — Progress Notes (Signed)
Wound vac discontinued and epicardial pacing wires removed per order Jadene Pierini, [place]).  Pt tolerated both procedures well.  Wound vac was previously on mid-sternal incision now open to air, per Jadene Pierini, [place].  VS obtained q15 min for 1 hr with pt on best rest for 1 hour post removal of epicardial pacing wires.  Two chest tube sutures remain.

## 2018-08-25 ENCOUNTER — Inpatient Hospital Stay (HOSPITAL_COMMUNITY): Payer: BLUE CROSS/BLUE SHIELD

## 2018-08-25 LAB — BASIC METABOLIC PANEL
Anion gap: 11 (ref 5–15)
BUN: 11 mg/dL (ref 8–23)
CO2: 33 mmol/L — ABNORMAL HIGH (ref 22–32)
Calcium: 8.8 mg/dL — ABNORMAL LOW (ref 8.9–10.3)
Chloride: 98 mmol/L (ref 98–111)
Creatinine, Ser: 0.78 mg/dL (ref 0.44–1.00)
GFR calc Af Amer: >60 mL/min (ref >60)
GFR calc non Af Amer: >60 mL/min (ref >60)
Glucose, Bld: 147 mg/dL — ABNORMAL HIGH (ref 70–99)
Potassium: 3.8 mmol/L (ref 3.5–5.1)
Sodium: 142 mmol/L (ref 135–145)

## 2018-08-25 LAB — CBC
HCT: 30.2 % — ABNORMAL LOW (ref 36.0–46.0)
Hemoglobin: 8.9 g/dL — ABNORMAL LOW (ref 12.0–15.0)
MCH: 28 pg (ref 26.0–34.0)
MCHC: 29.5 g/dL — ABNORMAL LOW (ref 30.0–36.0)
MCV: 95 fL (ref 80.0–100.0)
Platelets: 283 10*3/uL (ref 150–400)
RBC: 3.18 MIL/uL — ABNORMAL LOW (ref 3.87–5.11)
RDW: 16.1 % — ABNORMAL HIGH (ref 11.5–15.5)
WBC: 8.9 10*3/uL (ref 4.0–10.5)
nRBC: 0.2 % (ref 0.0–0.2)

## 2018-08-25 LAB — GLUCOSE, CAPILLARY
Glucose-Capillary: 125 mg/dL — ABNORMAL HIGH (ref 70–99)
Glucose-Capillary: 153 mg/dL — ABNORMAL HIGH (ref 70–99)
Glucose-Capillary: 174 mg/dL — ABNORMAL HIGH (ref 70–99)
Glucose-Capillary: 112 mg/dL — ABNORMAL HIGH (ref 70–99)

## 2018-08-25 MED ORDER — AMLODIPINE BESYLATE 10 MG PO TABS
10.0000 mg | ORAL_TABLET | Freq: Every day | ORAL | Status: DC
  Administered 2018-08-25 – 2018-08-26 (×2): 10 mg via ORAL
  Filled 2018-08-25 (×2): qty 1

## 2018-08-25 MED ORDER — METOPROLOL TARTRATE 25 MG PO TABS
25.0000 mg | ORAL_TABLET | Freq: Two times a day (BID) | ORAL | Status: DC
  Administered 2018-08-25 – 2018-08-26 (×3): 25 mg via ORAL
  Filled 2018-08-25 (×3): qty 1

## 2018-08-25 NOTE — Progress Notes (Addendum)
[phone] Reviewed sternal precautions, restrictions and heart healthy diabetic diet with the patient. Set up discharge open heart surgery video for the patient to watch. Patient says she is interested in attending phase 2  Outpatient cardiac rehab.Barnet Pall, RN,BSN 08/25/2018 3:37 PM

## 2018-08-25 NOTE — Progress Notes (Addendum)
[address]       [place] [address]             [phone]      8 Days Post-Op Procedure(s) (LRB): CORONARY ARTERY BYPASS GRAFTING (CABG) TIMES TWO: LIMA to LAD, SVG to RAMUS INTERMEDIATE)  USING LEFT INTERNAL MAMMARY ARTERY AND RIGHT GREATER SAPHENOUS VEIN HARVESTED ENDOSCOPICALLY. (N/A) TRANSESOPHAGEAL ECHOCARDIOGRAM (TEE) (N/A) Subjective: Feels a little better, feels recovery is slow  Objective: Vital signs in last 24 hours: Temp:  [98 F (36.7 C)-98.6 F (37 C)] 98.6 F (37 C) (12/14 0857) Pulse Rate:  [73-108] 108 (12/14 0857) Cardiac Rhythm: Normal sinus rhythm (12/14 0857) Resp:  [15-27] 21 (12/14 0857) BP: (122-152)/(58-90) 122/58 (12/14 0857) SpO2:  [95 %-100 %] 99 % (12/14 0857) Weight:  [107.5 kg] 107.5 kg (12/14 0444)  Hemodynamic parameters for last 24 hours:    Intake/Output from previous day: 12/13 0701 - 12/14 0700 In: 940 [P.O.:940] Out: -  Intake/Output this shift: Total I/O In: 240 [P.O.:240] Out: -   General appearance: alert, cooperative and no distress Heart: regular rate and rhythm Lungs: mildly dim in bases Abdomen: obese, soft, nontender Extremities: + BLE edema Wound: incis healing well  Lab Results: Recent Labs    08/24/18 0400 08/25/18 0520  WBC 8.8 8.9  HGB 8.9* 8.9*  HCT 29.6* 30.2*  PLT 280 283   BMET:  Recent Labs    08/24/18 0400 08/25/18 0520  NA 141 142  K 3.9 3.8  CL 98 98  CO2 32 33*  GLUCOSE 153* 147*  BUN 15 11  CREATININE 0.89 0.78  CALCIUM 9.2 8.8*    PT/INR: No results for input(s): LABPROT, INR in the last 72 hours. ABG    Component Value Date/Time   PHART 7.311 (L) 08/21/2018 0555   HCO3 28.3 (H) 08/21/2018 0555   TCO2 28 08/20/2018 1725   ACIDBASEDEF 2.0 08/20/2018 1725   O2SAT 99.5 08/21/2018 0555   CBG (last 3)  Recent Labs    08/24/18 1734 08/24/18 2105 08/25/18 0608  GLUCAP 138* 156* 125*    Meds Scheduled Meds: . amLODipine  5 mg Oral Daily  .  aspirin EC  81 mg Oral Daily  . atorvastatin  80 mg Oral q1800  . Chlorhexidine Gluconate Cloth  6 each Topical Daily  . enoxaparin (LOVENOX) injection  40 mg Subcutaneous QHS  . FLUoxetine  20 mg Oral Daily  . furosemide  40 mg Oral Daily  . insulin aspart  0-24 Units Subcutaneous TID AC & HS  . irbesartan  300 mg Oral Daily  . mouth rinse  15 mL Mouth Rinse BID  . metFORMIN  1,000 mg Oral BID WC  . metoprolol tartrate  12.5 mg Oral BID  . omega-3 acid ethyl esters  2 g Oral BID  . potassium chloride  20 mEq Oral Daily  . prasugrel  10 mg Oral Daily  . sodium chloride flush  10-40 mL Intracatheter Q12H  . sodium chloride flush  3 mL Intravenous Q12H   Continuous Infusions: . sodium chloride     PRN Meds:.sodium chloride, acetaminophen, bisacodyl **OR** bisacodyl, guaiFENesin, Melatonin, ondansetron **OR** ondansetron (ZOFRAN) IV, sodium chloride flush, sodium chloride flush, traMADol  Xrays Dg Chest 2 View  Result Date: 08/25/2018 CLINICAL DATA:  Status post coronary artery bypass graft. EXAM: CHEST - 2 VIEW COMPARISON:  Radiograph of August 23, 2018. FINDINGS: Stable cardiomegaly. No pneumothorax is noted. Right-sided PICC line is unchanged in  position. Left lung is clear. Stable bibasilar atelectasis or infiltrate is noted with associated pleural effusion. Bony thorax is unremarkable. IMPRESSION: Stable bibasilar atelectasis or infiltrates with associated pleural effusions, right greater than left. Electronically Signed   By: Marijo Conception, M.D.   On: 08/25/2018 08:41    Assessment/Plan: S/P Procedure(s) (LRB): CORONARY ARTERY BYPASS GRAFTING (CABG) TIMES TWO: LIMA to LAD, SVG to RAMUS INTERMEDIATE)  USING LEFT INTERNAL MAMMARY ARTERY AND RIGHT GREATER SAPHENOUS VEIN HARVESTED ENDOSCOPICALLY. (N/A) TRANSESOPHAGEAL ECHOCARDIOGRAM (TEE) (N/A)  1 slow but steady progress 2 BP still a bit high, tachy at times will increase beta blocker and norvasc dose 3 sats good on RA 4  renal fxn in normal range, cont current diuretics 5 CBC very stable 6 sugars adeq controlled 7 push rehab and pulm toilet as able 8 home 1-2 days  LOS: 12 days    John Giovanni [facility] 08/25/2018 Pager [phone]  I have seen and examined the patient and agree with the assessment and plan as outlined.  Tentatively plan d/c home tomorrow.  Rexene Alberts, MD 08/25/2018 1:38 PM

## 2018-08-25 NOTE — Progress Notes (Signed)
CARDIAC REHAB PHASE I   PRE:  Rate/Rhythm: sinus tach 100  BP:    Sitting: 154/63 Large cuff   SaO2: 97% room air  MODE:  Ambulation: 300 ft   POST:  Rate/Rhythm: Sinus tach 117  BP:    Sitting: 112/52  Then 105/59    SaO2: 95% on Room Air 1050-1140 Patient had to use the bathroom  And had a BM prior to walking then walked about 300 feet using the rolling walker. Pamela Thomas had to go back to her room to have another BM. Patient's RN aware and present. The large blood pressure cuff was used. Patient was asymptomatic during her walk today. Patient back to chair with call bell within reach.Harrell Gave  RN BSN

## 2018-08-26 LAB — GLUCOSE, CAPILLARY: Glucose-Capillary: 143 mg/dL — ABNORMAL HIGH (ref 70–99)

## 2018-08-26 MED ORDER — TRAMADOL HCL 50 MG PO TABS: 50.0000 mg | ORAL_TABLET | Freq: Four times a day (QID) | ORAL | 0 refills | Status: AC | PRN

## 2018-08-26 MED ORDER — OMEGA-3-ACID ETHYL ESTERS 1 G PO CAPS: 2.0000 g | ORAL_CAPSULE | Freq: Two times a day (BID) | ORAL | 1 refills | Status: DC

## 2018-08-26 MED ORDER — AMLODIPINE BESYLATE 10 MG PO TABS: 10.0000 mg | ORAL_TABLET | Freq: Every day | ORAL | 1 refills | Status: DC

## 2018-08-26 MED ORDER — ACETAMINOPHEN 325 MG PO TABS: 650.0000 mg | ORAL_TABLET | Freq: Four times a day (QID) | ORAL | Status: DC | PRN

## 2018-08-26 NOTE — Progress Notes (Signed)
Patient discharged.  Chest tube sutures removed per order by Jadene Pierini.  Patient tolerated procedure well. PICC line removed by IV team nurse per order by Jadene Pierini, [place].  Vital signs stable, CCMD notified.  AVS reviewed with patient, daughter, and husband.  Pt acknowledged understanding.  Mikle Bosworth, [place] transported patient to [facility]	[place].

## 2018-08-26 NOTE — Progress Notes (Addendum)
[address]       [place] [address]             [phone]      9 Days Post-Op Procedure(s) (LRB): CORONARY ARTERY BYPASS GRAFTING (CABG) TIMES TWO: LIMA to LAD, SVG to RAMUS INTERMEDIATE)  USING LEFT INTERNAL MAMMARY ARTERY AND RIGHT GREATER SAPHENOUS VEIN HARVESTED ENDOSCOPICALLY. (N/A) TRANSESOPHAGEAL ECHOCARDIOGRAM (TEE) (N/A) Subjective: Looks and feels well, says she's ready to go home  Objective: Vital signs in last 24 hours: Temp:  [97.5 F (36.4 C)-98.9 F (37.2 C)] 98.9 F (37.2 C) (12/15 0743) Pulse Rate:  [84-98] 93 (12/15 0743) Cardiac Rhythm: Normal sinus rhythm (12/15 0700) Resp:  [13-26] 25 (12/15 0743) BP: (113-141)/(51-67) 141/67 (12/15 0743) SpO2:  [96 %-99 %] 98 % (12/15 0329) Weight:  [108.1 kg] 108.1 kg (12/15 0322)  Hemodynamic parameters for last 24 hours:    Intake/Output from previous day: 12/14 0701 - 12/15 0700 In: 1330 [P.O.:1320; I.V.:10] Out: -  Intake/Output this shift: No intake/output data recorded.  General appearance: alert, cooperative and no distress Heart: regular rate and rhythm Lungs: clear to auscultation bilaterally Abdomen: benign Extremities: + edema Wound: incis healing well  Lab Results: Recent Labs    08/24/18 0400 08/25/18 0520  WBC 8.8 8.9  HGB 8.9* 8.9*  HCT 29.6* 30.2*  PLT 280 283   BMET:  Recent Labs    08/24/18 0400 08/25/18 0520  NA 141 142  K 3.9 3.8  CL 98 98  CO2 32 33*  GLUCOSE 153* 147*  BUN 15 11  CREATININE 0.89 0.78  CALCIUM 9.2 8.8*    PT/INR: No results for input(s): LABPROT, INR in the last 72 hours. ABG    Component Value Date/Time   PHART 7.311 (L) 08/21/2018 0555   HCO3 28.3 (H) 08/21/2018 0555   TCO2 28 08/20/2018 1725   ACIDBASEDEF 2.0 08/20/2018 1725   O2SAT 99.5 08/21/2018 0555   CBG (last 3)  Recent Labs    08/25/18 1658 08/25/18 2038 08/26/18 0636  GLUCAP 174* 112* 143*    Meds Scheduled Meds: . amLODipine  10 mg Oral Daily  .  aspirin EC  81 mg Oral Daily  . atorvastatin  80 mg Oral q1800  . Chlorhexidine Gluconate Cloth  6 each Topical Daily  . enoxaparin (LOVENOX) injection  40 mg Subcutaneous QHS  . FLUoxetine  20 mg Oral Daily  . furosemide  40 mg Oral Daily  . insulin aspart  0-24 Units Subcutaneous TID AC & HS  . irbesartan  300 mg Oral Daily  . mouth rinse  15 mL Mouth Rinse BID  . metFORMIN  1,000 mg Oral BID WC  . metoprolol tartrate  25 mg Oral BID  . omega-3 acid ethyl esters  2 g Oral BID  . potassium chloride  20 mEq Oral Daily  . prasugrel  10 mg Oral Daily  . sodium chloride flush  10-40 mL Intracatheter Q12H  . sodium chloride flush  3 mL Intravenous Q12H   Continuous Infusions: . sodium chloride     PRN Meds:.sodium chloride, acetaminophen, bisacodyl **OR** bisacodyl, guaiFENesin, Melatonin, ondansetron **OR** ondansetron (ZOFRAN) IV, sodium chloride flush, sodium chloride flush, traMADol  Xrays Dg Chest 2 View  Result Date: 08/25/2018 CLINICAL DATA:  Status post coronary artery bypass graft. EXAM: CHEST - 2 VIEW COMPARISON:  Radiograph of August 23, 2018. FINDINGS: Stable cardiomegaly. No pneumothorax is noted. Right-sided PICC line is unchanged in position. Left lung is  clear. Stable bibasilar atelectasis or infiltrate is noted with associated pleural effusion. Bony thorax is unremarkable. IMPRESSION: Stable bibasilar atelectasis or infiltrates with associated pleural effusions, right greater than left. Electronically Signed   By: Marijo Conception, M.D.   On: 08/25/2018 08:41    Assessment/Plan: S/P Procedure(s) (LRB): CORONARY ARTERY BYPASS GRAFTING (CABG) TIMES TWO: LIMA to LAD, SVG to RAMUS INTERMEDIATE)  USING LEFT INTERNAL MAMMARY ARTERY AND RIGHT GREATER SAPHENOUS VEIN HARVESTED ENDOSCOPICALLY. (N/A) TRANSESOPHAGEAL ECHOCARDIOGRAM (TEE) (N/A)   1 doing well, a little hypertensive at times but mostly well controlled. Also a little tachy at times but mostly well controlled -will  increase beta blocker to home dose, may need some outpatient adjustments 2 sugars adeq controlled- nutrition and lifestyle management as able 3 cont diuretics - was on preop and conts with mod edema 4 table for discharge today  LOS: 13 days    John Giovanni [facility] 08/26/2018 Pager [phone]

## 2018-08-26 NOTE — Progress Notes (Signed)
PICC line removed per order. Line intact. Vaseline gauze pressure dressing applied and pressure held. Dressing clean, dry, and intact. Patient and RN aware she is on bed rest for 30 minutes until 11:20.  Patient aware to leave dressing on and not get it wet for 24 hours.

## 2018-08-27 ENCOUNTER — Ambulatory Visit: Payer: BLUE CROSS/BLUE SHIELD | Admitting: Cardiology

## 2018-08-27 NOTE — Progress Notes (Signed)
CARDIAC REHAB PHASE I   PRE:  Rate/Rhythm: 92 SR   BP:  Sitting: 151/83      SaO2: 96%RA   MODE:  Ambulation: 250 ft   POST:  Rate/Rhythm: 108 ST  BP:  Sitting: 160/82      SaO2: 99% RA  Pt ambulated 277ft with RW, stead gait. Pt denied any complaints of SOB, CP or dizziness. Pt just states she was fatigued. Pt returned back to recliner per pt request. Reviewed Cardiac Rehab with pt. Phase II Cardiac Rehab to [facility] sent. Call bell within reach.   Carma Lair MS, ACSM CEP  8:05 AM 08/27/2018

## 2018-08-28 ENCOUNTER — Telehealth (HOSPITAL_COMMUNITY): Payer: Self-pay

## 2018-08-28 NOTE — Telephone Encounter (Signed)
Attempted to call patient in regards to Cardiac Rehab - LM on VM 

## 2018-08-28 NOTE — Telephone Encounter (Signed)
Pt insurance is active and benefits verified through [place]. Co-pay $0.00, DED $3,000.00/$2,000.00 met, out of pocket $10,000.00/$6,442.76 met, co-insurance 20%. No pre-authorization required. [employer]port, 08/28/18 @ 8:08AM, REF# [id number]  Will contact patient to see if she is interested in the Cardiac Rehab Program. If interested, patient will need to complete follow up appt. Once completed, patient will be contacted for scheduling upon review by the RN Navigator.

## 2018-08-29 ENCOUNTER — Telehealth: Payer: Self-pay | Admitting: *Deleted

## 2018-08-29 NOTE — Telephone Encounter (Signed)
Pamela Thomas called this morning with concerns of increasing bilateral foot and ankle edema s/p CABG X 2 on 08/17/18 with d/c on 08/26/18.  She said they were positive for pitting when asked to reproduce.  She isn't doing proper elevation which was reviewed  She is presently on Lasix 40/K 20 qd.  After consulting with Dr. Servando Snare in the late afternoon, he has instructed her to double her Lasix/K for 3 days and keep [name] informed if no improvement.  She does have a f/u apt. With our PA-C on Monday at which time we will reevaluate if not needed before.  She agrees with this plan.

## 2018-09-03 ENCOUNTER — Other Ambulatory Visit: Payer: Self-pay

## 2018-09-03 ENCOUNTER — Ambulatory Visit (INDEPENDENT_AMBULATORY_CARE_PROVIDER_SITE_OTHER): Payer: Self-pay

## 2018-09-03 ENCOUNTER — Telehealth: Payer: Self-pay | Admitting: Internal Medicine

## 2018-09-03 DIAGNOSIS — Z4802 Encounter for removal of sutures: Secondary | ICD-10-CM

## 2018-09-03 MED ORDER — ICOSAPENT ETHYL 1 G PO CAPS: 2.0000 g | ORAL_CAPSULE | Freq: Two times a day (BID) | ORAL | 11 refills | Status: DC

## 2018-09-03 NOTE — Telephone Encounter (Signed)
MyChart message sent to patient explained approval status, that Rx will be sent to pharmacy and vascepa will replace lovaza. Provided link to vascepa co-pay card. Rx(s) sent to pharmacy electronically.

## 2018-09-03 NOTE — Telephone Encounter (Signed)
YAY!  Dr. Lemmie Evens

## 2018-09-03 NOTE — Progress Notes (Signed)
Removed 4 sutures from right leg EVH site, no signs of infection and patient tolerated well.

## 2018-09-03 NOTE — Addendum Note (Signed)
Addended by: Fidel Levy on: 09/03/2018 12:58 PM   Modules accepted: Orders

## 2018-09-03 NOTE — Progress Notes (Signed)
Cardiology Office Note   Date:  09/04/2018   ID:  Pamela Thomas, DOB [birth year], MRN [id number]  PCP:  Dickie La, MD  Cardiologist:  [facility]   Chief Complaint  Patient presents with  . Hospitalization Follow-up  . Coronary Artery Disease    s/p CABG and PCI with stent      History of Present Illness: Pamela Thomas is a 62 y.o. female who presents for post hospitalization after admission for CABG. She had a STEMI on 07/16/2018 with emergent stent (DES to the RCA) with residual LAD disease. She was evaluated by Dr's Carrolyn Meiers, and Rhame.   Below copied for accuracy:  The complexity of her LAD diagonal disease distal left main would make stenting difficult and carry increased risk.I discussed with her proceeding with coronary artery bypass grafting." (Per Dr.Gerhardt)  Effient was stopped on 08/09/2018 and admitted for crossover Aggrastat for antiplatelet washout and protection of new stent. CABG was planned for 08/17/2018. Echocardiogram was planned prior to surgery.   Other history includes dyslipidemia Type II diabetes, HTN, and obesity.   CABG was completed as planned with LIMA to LAD, and reverse SVG to intermediate coronary artery, with right greater SVG endoscopic vein harvesting. She tolerated the surgery very well. She was transitioned back to Effient post surgery.   She comes today with multiple questions about her surgery and prognosis. She wants also to make sure that Morphine is added to her allergy list, as she had severe hallucinations and difficult arousal post operative recovery.   She is doing well, adhering to her medical regimen without complaints of bleeding, worsening chest pain, DOE. She continues to do the incentive spirometry. She is walking 10 minutes a day, and plans to go to cardiac rehab.   Past Medical History:  Diagnosis Date  . ALLERGIC RHINITIS, SEASONAL   . Diabetes mellitus without complication ([place])   . DYSLIPIDEMIA   .  Dysrhythmia    palpitations-evaluated by Dr Johnsie Cancel 6/12/eccho 6/13  . Glucose intolerance (impaired glucose tolerance)   . History of blood transfusion   . Hypertension   . OBESITY, NOS   . Osteoarthritis    rt knee  . PLANTAR FASCIITIS, BILATERAL   . Vertigo     Past Surgical History:  Procedure Laterality Date  . BREAST BIOPSY  05/27/03   left  . CARPAL TUNNEL RELEASE  05/27/03   Right  . CESAREAN SECTION  05/27/03  . CORONARY ARTERY BYPASS GRAFT N/A 08/17/2018   Procedure: CORONARY ARTERY BYPASS GRAFTING (CABG) TIMES TWO: LIMA to LAD, SVG to RAMUS INTERMEDIATE)  USING LEFT INTERNAL MAMMARY ARTERY AND RIGHT GREATER SAPHENOUS VEIN HARVESTED ENDOSCOPICALLY.;  Surgeon: [name], MD;  Location: [facility];  Service: Open Heart Surgery;  Laterality: N/A;  . KNEE SURGERY     left- removal fatty tumor  . ROTATOR CUFF REPAIR     Bilateral  . TEE WITHOUT CARDIOVERSION N/A 08/17/2018   Procedure: TRANSESOPHAGEAL ECHOCARDIOGRAM (TEE);  Surgeon: [name], MD;  Location: [facility];  Service: Open Heart Surgery;  Laterality: N/A;  . TOTAL KNEE ARTHROPLASTY  10/10/2012   Procedure: TOTAL KNEE ARTHROPLASTY;  Surgeon: Gearlean Alf, MD;  Location: [facility];  Service: Orthopedics;  Laterality: Right;     Current Outpatient Medications  Medication Sig Dispense Refill  . acetaminophen (TYLENOL) 325 MG tablet Take 2 tablets (650 mg total) by mouth every 6 (six) hours as needed for mild pain.    Marland Kitchen amLODipine (NORVASC) 10  MG tablet Take 1 tablet (10 mg total) by mouth daily. 30 tablet 1  . aspirin 81 MG tablet Take 1 tablet (81 mg total) by mouth daily. (Patient taking differently: Take 81 mg by mouth every morning. ) 100 tablet 3  . atorvastatin (LIPITOR) 80 MG tablet Take 80 mg by mouth every evening.     . candesartan (ATACAND) 32 MG tablet Take 1 tablet (32 mg total) by mouth daily. (Patient taking differently: Take 32 mg by mouth every morning. ) 90 tablet 3  . FLUoxetine (PROZAC) 20 MG  tablet TAKE 1 TABLET BY MOUTH EVERY DAY (Patient taking differently: Take 20 mg by mouth every morning. ) 90 tablet 3  . furosemide (LASIX) 20 MG tablet Take 1 tablet (20 mg total) by mouth 2 (two) times daily. Patient does not want to split a 40 mg  Tab so please do not substitute 180 tablet 3  . Icosapent Ethyl 1 g CAPS Take 2 capsules (2 g total) by mouth 2 (two) times daily. 120 capsule 11  . Melatonin 3 MG TABS Take 3-6 mg by mouth at bedtime as needed (sleep).     . metFORMIN (GLUCOPHAGE) 1000 MG tablet Take 1 tablet (1,000 mg total) by mouth 2 (two) times daily with a meal. 180 tablet 3  . metoprolol succinate (TOPROL-XL) 50 MG 24 hr tablet TAKE 1 TABLET BY MOUTH TWICE A DAY (Patient taking differently: Take 50 mg by mouth 2 (two) times daily. ) 180 tablet 3  . omega-3 acid ethyl esters (LOVAZA) 1 g capsule Take 2 capsules (2 g total) by mouth 2 (two) times daily. 120 capsule 1  . potassium chloride SA (KLOR-CON M20) 20 MEQ tablet Take 1 tablet (20 mEq total) by mouth daily. (Patient taking differently: Take 20 mEq by mouth every morning. ) 90 tablet 3  . prasugrel (EFFIENT) 10 MG TABS tablet Take 10 mg by mouth daily.     No current facility-administered medications for this visit.     Allergies:   Oxycodone    Social History:  The patient  reports that she quit smoking about 27 years ago. She has never used smokeless tobacco. She reports current alcohol use. She reports that she does not use drugs.   Family History:  The patient's family history includes COPD in her brother and mother; Diabetes in her unknown relative; Healthy in her daughter; Lung cancer in her father; Melanoma in her brother.    ROS: All other systems are reviewed and negative. Unless otherwise mentioned in H&P    PHYSICAL EXAM: VS:  BP 120/60   Pulse 78   Ht 5\' 2"  (1.575 m)   Wt 232 lb 3.2 oz (105.3 kg)   LMP 10/10/2010   BMI 42.47 kg/m  , BMI Body mass index is 42.47 kg/m. GEN: Well nourished, well  developed, in no acute distress HEENT: normal Neck: no JVD, bilateral carotid bruits, or masses Cardiac: RRR; no murmurs, rubs, or gallops, mild edema on the right leg distal to SVG harvest Respiratory:  Clear to auscultation bilaterally, normal work of breathing GI: soft, nontender, nondistended, + BS MS: no deformity or atrophy. Well healed sternotomy incision with keloid type scaring noted. CT drain incision on the lower left has not completely closed yet. No drainage.  Skin: warm and dry, no rash Neuro:  Strength and sensation are intact Psych: euthymic mood, full affect   EKG: NSR rate of 78 bpm. Anterior infarct.   Recent Labs: 08/13/2018: ALT 21; TSH  2.136 08/18/2018: Magnesium 2.3 08/25/2018: BUN 11; Creatinine, Ser 0.78; Hemoglobin 8.9; Platelets 283; Potassium 3.8; Sodium 142    Lipid Panel    Component Value Date/Time   CHOL 220 (H) 05/11/2011 0932   TRIG 576 (H) 05/11/2011 0932   HDL 40 05/11/2011 0932   CHOLHDL 5.5 05/11/2011 0932   VLDL NOT CALC 05/11/2011 0932   LDLCALC  05/11/2011 0932     Comment:       Not calculated due to Triglyceride >400. Suggest ordering Direct LDL (Unit Code: [phone]).   Total Cholesterol/HDL Ratio:CHD Risk                        Coronary Heart Disease Risk Table                                        Men       Women          1/2 Average Risk              3.4        3.3              Average Risk              5.0        4.4           2X Average Risk              9.6        7.1           3X Average Risk             23.4       11.0 Use the calculated Patient Ratio above and the CHD Risk table  to determine the patient's CHD Risk. ATP III Classification (LDL):       < 100        mg/dL         Optimal      100 - 129     mg/dL         Near or Above Optimal      130 - 159     mg/dL         Borderline High      160 - 189     mg/dL         High       > 190        mg/dL         Very High     LDLDIRECT 123 (H) 01/24/2018 0937   LDLDIRECT 76  07/27/2016 1048      Wt Readings from Last 3 Encounters:  09/04/18 232 lb 3.2 oz (105.3 kg)  08/26/18 238 lb 5.1 oz (108.1 kg)  08/07/18 237 lb (107.5 kg)      Other studies Reviewed: Cardiac cath [facility] 07/17/2018)  1.  Left main: Patent. 2.  LAD: 80% proximal lesion prior to the takeoff of prominent and tortuous diagonal branches. The remainder of the LAD is tortuous with minor diffuse disease and extends to the apex, but tapers rapidly. 3.  Left circumflex: Very small vessel that gives rise only to a very small distal OM. 4.  RCA: Large dominant tortuous vessel with a mid occlusion. The distal vessel has an extensive distribution. The primary branch is posterolateral branch. 5.  Left ventricle with inferobasal wall hypokinesis with overall normal LV systolic function. LVEDP is 25-30 mmHg. No significant gradient across the aortic valve.  CONCLUSION: Successful stenting of an occluded large dominant RCA in the setting of an inferior STEMI. The patient with a chronic proximal LAD stenosis, which will require revascularization, likely in [place].  Options remain for possible LIMA to LAD versus percutaneous intervention, but this can be accomplished as an outpatient   Echocardiogram 08/14/2018  Left ventricle: The cavity size was normal. Wall thickness was   increased in a pattern of mild LVH. Systolic function was normal.   The estimated ejection fraction was in the range of 55% to 60%.   Wall motion was normal; there were no regional wall motion   abnormalities. Features are consistent with a pseudonormal left   ventricular filling pattern, with concomitant abnormal relaxation   and increased filling pressure (grade 2 diastolic dysfunction).   Doppler parameters are consistent with high ventricular filling   pressure. - Aortic valve: There was trivial regurgitation. - Mitral valve: There was mild regurgitation.  Impressions:  - Normal LV systolic  function; moderate diastolic dysfunction; mild   LVH; trace AI; mild MR.  ASSESSMENT AND PLAN:  1.  CAD: S/P emergent cardiac cath with DES to the RCA with residual LAD. Subsequent CABG-LIMA to LAD. She has recovered well and is medically compliant. She is slowly walking and using incentive spirometry. She had multiple questions which were answered, and she verbalized understanding. She is encouraged to participate in cardiac rehab once released by CVTS.   2. Dyslipidemia: She is not tolerant of statins, but is trying to take atorvastatin at night to avoid "flu like symptoms" and has yet to see a difference. She is given samples of Lovalo 2 mg. She will follow up with Dr. Debara Pickett in the lipid clinic. Goal of LDL < 70.   3. Hypertension: BP is well controlled currently. Continue candesartan and amlodipine.   4. Obesity: When she is able, participate in cardiac rehab for education and for increased exercise as a foundation for healthy lifestyle changes.    Current medicines are reviewed at length with the patient today.  Multiple questions answered.   Labs/ tests ordered today include: None   Phill Myron. West Pugh, ANP, AACC   09/04/2018 9:05 AM    [place] [address] Office [phone] Fax [phone]

## 2018-09-03 NOTE — Telephone Encounter (Signed)
Received PA request thru [email or web address] for Vascepa. This has been submitted with MD note.

## 2018-09-03 NOTE — Telephone Encounter (Signed)
PA Case: [id number] Status: Approved Coverage Starts on: 09/03/2018 12:00:00 AM Coverage Ends on: 09/03/2019 12:00:00 AM

## 2018-09-04 ENCOUNTER — Encounter: Payer: Self-pay | Admitting: Adult Health

## 2018-09-04 ENCOUNTER — Ambulatory Visit: Payer: BLUE CROSS/BLUE SHIELD | Admitting: Adult Health

## 2018-09-04 VITALS — BP 120/60 | HR 78 | Ht 62.0 in | Wt 232.2 lb

## 2018-09-04 DIAGNOSIS — Z951 Presence of aortocoronary bypass graft: Secondary | ICD-10-CM | POA: Diagnosis not present

## 2018-09-04 DIAGNOSIS — I2510 Atherosclerotic heart disease of native coronary artery without angina pectoris: Secondary | ICD-10-CM

## 2018-09-04 DIAGNOSIS — I10 Essential (primary) hypertension: Secondary | ICD-10-CM | POA: Diagnosis not present

## 2018-09-04 DIAGNOSIS — E7800 Pure hypercholesterolemia, unspecified: Secondary | ICD-10-CM

## 2018-09-04 DIAGNOSIS — Z955 Presence of coronary angioplasty implant and graft: Secondary | ICD-10-CM

## 2018-09-04 NOTE — Patient Instructions (Signed)
Follow-Up: You will need a follow up appointment in 2 months.  You may see Minus Breeding, MD or one of the following Advanced Practice Providers on your designated Care Team:  Rosaria Ferries, PA-C   Jory Sims, DNP, ANP   Medication Instructions:  NO CHANGES- Your physician recommends that you continue on your current medications as directed. Please refer to the Current Medication list given to you today. If you need a refill on your cardiac medications before your next appointment, please call your pharmacy. Labwork: When you have labs (blood work) and your tests are completely normal, you will receive your results ONLY by [place] (if you have MyChart) -OR- A paper copy in the mail. At [facility], you and your health needs are our priority.  As part of our continuing mission to provide you with exceptional heart care, we have created designated Provider Care Teams.  These Care Teams include your primary Cardiologist (physician) and Advanced Practice Providers (APPs -  Physician Assistants and Nurse Practitioners) who all work together to provide you with the care you need, when you need it.  Thank you for choosing [facility] at [facility]!!

## 2018-09-06 ENCOUNTER — Encounter (HOSPITAL_COMMUNITY): Payer: Self-pay | Admitting: Cardiology

## 2018-09-14 NOTE — Telephone Encounter (Signed)
Called patient to see if she was interested in participating in the Cardiac Rehab Program. Patient stated yes. Patient will come in for orientation on 10/23/18 @ 8AM and will attend the 945AM exercise class.  Mailed homework package.  Went over insurance, patient verbalized understanding.

## 2018-09-24 ENCOUNTER — Other Ambulatory Visit: Payer: Self-pay | Admitting: Internal Medicine

## 2018-09-26 ENCOUNTER — Other Ambulatory Visit: Payer: Self-pay | Admitting: Cardiothoracic Surgery

## 2018-09-26 DIAGNOSIS — I25119 Atherosclerotic heart disease of native coronary artery with unspecified angina pectoris: Secondary | ICD-10-CM

## 2018-09-27 ENCOUNTER — Ambulatory Visit (INDEPENDENT_AMBULATORY_CARE_PROVIDER_SITE_OTHER): Payer: Self-pay | Admitting: Cardiothoracic Surgery

## 2018-09-27 ENCOUNTER — Ambulatory Visit
Admission: RE | Admit: 2018-09-27 | Discharge: 2018-09-27 | Disposition: A | Discharge status: Home / Self Care | Payer: BLUE CROSS/BLUE SHIELD | Source: Ambulatory Visit | Attending: Cardiothoracic Surgery | Admitting: Cardiothoracic Surgery

## 2018-09-27 ENCOUNTER — Encounter: Payer: Self-pay | Admitting: Cardiothoracic Surgery

## 2018-09-27 ENCOUNTER — Other Ambulatory Visit: Payer: Self-pay

## 2018-09-27 VITALS — BP 123/61 | HR 75 | Resp 16 | Ht 62.0 in | Wt 235.2 lb

## 2018-09-27 DIAGNOSIS — I25119 Atherosclerotic heart disease of native coronary artery with unspecified angina pectoris: Secondary | ICD-10-CM

## 2018-09-27 DIAGNOSIS — R7989 Other specified abnormal findings of blood chemistry: Secondary | ICD-10-CM | POA: Diagnosis not present

## 2018-09-27 DIAGNOSIS — Z951 Presence of aortocoronary bypass graft: Secondary | ICD-10-CM

## 2018-09-27 NOTE — Progress Notes (Signed)
[address]       [place] [address]             [phone]      Chrisma A Palomino Farley Medical Record #[id number] Date of Birth: [birth year]  Referring: Minus Breeding, MD Primary Care: Dickie La, MD Primary Cardiologist: Minus Breeding, MD   Chief Complaint:   POST OP FOLLOW UP OPERATIVE REPORT DATE OF PROCEDURE:  08/17/2018 PREOPERATIVE DIAGNOSIS:  Coronary occlusive disease with ST elevation myocardial infarction 1 month previously treated with acute stenting of the right coronary artery. POSTOPERATIVE DIAGNOSIS:  Coronary occlusive disease with ST elevation myocardial infarction 1 month previously treated with acute stenting of the right coronary artery. SURGICAL PROCEDURE:  Coronary artery bypass grafting x2 with the left internal mammary to the left anterior descending coronary artery and reverse saphenous vein graft to the intermediate coronary artery with right greater saphenous thigh endoscopic vein  harvesting.  History of Present Illness:     Patient making steady progress following coronary artery bypass grafting last month, after she suffered an acute inferior myocardial infarction with occlusion of her right coronary artery.  She was initially stented in the right coronary artery but had significant residual disease involving her LAD and intermediate.  She had periods of confusion and lethargy associated with pain medication following her surgery.  She is now making good progress postoperatively.  Ambulating well.  To start cardiac rehab in the coming 2 weeks.  He has had no recurrent angina.  Shortness of breath is much improved.      Past Medical History:  Diagnosis Date  . ALLERGIC RHINITIS, SEASONAL   . Diabetes mellitus without complication ([place])   . DYSLIPIDEMIA   . Dysrhythmia    palpitations-evaluated by Dr Johnsie Cancel 6/12/eccho 6/13  . Glucose intolerance (impaired glucose tolerance)   . History of blood transfusion   .  Hypertension   . OBESITY, NOS   . Osteoarthritis    rt knee  . PLANTAR FASCIITIS, BILATERAL   . Vertigo      Social History   Tobacco Use  Smoking Status Former Smoker  . Last attempt to quit: 09/12/1990  . Years since quitting: 28.0  Smokeless Tobacco Never Used    Social History   Substance and Sexual Activity  Alcohol Use Yes  . Alcohol/week: 0.0 standard drinks   Comment: rare     Allergies  Allergen Reactions  . Morphine And Related     Severe hallucinations, difficult arousal.   . Statins     Myalgia pain moderate, flu like symptoms.   . Oxycodone Nausea Only    Has to take nausea medications if taken. Prefer not to take    Current Outpatient Medications  Medication Sig Dispense Refill  . acetaminophen (TYLENOL) 325 MG tablet Take 2 tablets (650 mg total) by mouth every 6 (six) hours as needed for mild pain.    Marland Kitchen amLODipine (NORVASC) 10 MG tablet TAKE 1 TABLET BY MOUTH EVERY DAY 30 tablet 10  . aspirin 81 MG tablet Take 1 tablet (81 mg total) by mouth daily. (Patient taking differently: Take 81 mg by mouth every morning. ) 100 tablet 3  . atorvastatin (LIPITOR) 80 MG tablet Take 80 mg by mouth every evening.     . candesartan (ATACAND) 32 MG tablet Take 1 tablet (32 mg total) by mouth daily. (Patient taking differently: Take 32 mg by mouth every morning. ) 90 tablet 3  . FLUoxetine (  PROZAC) 20 MG tablet TAKE 1 TABLET BY MOUTH EVERY DAY (Patient taking differently: Take 20 mg by mouth every morning. ) 90 tablet 3  . furosemide (LASIX) 20 MG tablet Take 1 tablet (20 mg total) by mouth 2 (two) times daily. Patient does not want to split a 40 mg  Tab so please do not substitute 180 tablet 3  . Melatonin 3 MG TABS Take 3-6 mg by mouth at bedtime as needed (sleep).     . metFORMIN (GLUCOPHAGE) 1000 MG tablet Take 1 tablet (1,000 mg total) by mouth 2 (two) times daily with a meal. 180 tablet 3  . metoprolol succinate (TOPROL-XL) 50 MG 24 hr tablet TAKE 1 TABLET BY MOUTH  TWICE A DAY (Patient taking differently: Take 50 mg by mouth 2 (two) times daily. ) 180 tablet 3  . omega-3 acid ethyl esters (LOVAZA) 1 g capsule Take 2 capsules (2 g total) by mouth 2 (two) times daily. 120 capsule 1  . potassium chloride SA (KLOR-CON M20) 20 MEQ tablet Take 1 tablet (20 mEq total) by mouth daily. (Patient taking differently: Take 20 mEq by mouth every morning. ) 90 tablet 3  . prasugrel (EFFIENT) 10 MG TABS tablet Take 10 mg by mouth daily.     No current facility-administered medications for this visit.        Physical Exam: BP 123/61 (BP Location: Left Arm, Patient Position: Sitting, Cuff Size: Large)   Pulse 75   Resp 16   Ht 5\' 2"  (1.575 m)   Wt 235 lb 3.2 oz (106.7 kg)   LMP 10/10/2010   SpO2 95% Comment: RA  BMI 43.02 kg/m   General appearance: alert and cooperative Neurologic: intact Heart: regular rate and rhythm, S1, S2 normal, no murmur, click, rub or gallop Lungs: clear to auscultation bilaterally Abdomen: soft, non-tender; bowel sounds normal; no masses,  no organomegaly Extremities: extremities normal, atraumatic, no cyanosis or edema and Homans sign is negative, no sign of DVT Wound: Incisions are well-healed sternum is stable   Diagnostic Studies & Laboratory data:     Recent Radiology Findings:   Dg Chest 2 View  Result Date: 09/27/2018 CLINICAL DATA:  Status post coronary bypass grafting EXAM: CHEST - 2 VIEW COMPARISON:  08/25/2018 FINDINGS: Cardiac shadow is mildly enlarged but stable. Postsurgical changes are again noted. Right-sided PICC line is been removed in the interval. No focal infiltrate or sizable effusion is seen. Mild elevation of the right hemidiaphragm is noted. IMPRESSION: No acute abnormality noted. Electronically Signed   By: Inez Catalina M.D.   On: 09/27/2018 13:14    I have independently reviewed the above radiology studies  and reviewed the findings with the patient.    Recent Lab Findings: Lab Results  Component  Value Date   WBC 8.9 08/25/2018   HGB 8.9 (L) 08/25/2018   HCT 30.2 (L) 08/25/2018   PLT 283 08/25/2018   GLUCOSE 147 (H) 08/25/2018   CHOL 220 (H) 05/11/2011   TRIG 576 (H) 05/11/2011   HDL 40 05/11/2011   LDLDIRECT 123 (H) 01/24/2018   [place]  05/11/2011     Comment:       Not calculated due to Triglyceride >400. Suggest ordering Direct LDL (Unit Code: [phone]).   Total Cholesterol/HDL Ratio:CHD Risk                        Coronary Heart Disease Risk Table  Men       Women          1/2 Average Risk              3.4        3.3              Average Risk              5.0        4.4           2X Average Risk              9.6        7.1           3X Average Risk             23.4       11.0 Use the calculated Patient Ratio above and the CHD Risk table  to determine the patient's CHD Risk. ATP III Classification (LDL):       < 100        mg/dL         Optimal      100 - 129     mg/dL         Near or Above Optimal      130 - 159     mg/dL         Borderline High      160 - 189     mg/dL         High       > 190        mg/dL         Very High     ALT 21 08/13/2018   AST 21 08/13/2018   NA 142 08/25/2018   K 3.8 08/25/2018   CL 98 08/25/2018   CREATININE 0.78 08/25/2018   BUN 11 08/25/2018   CO2 33 (H) 08/25/2018   TSH 2.136 08/13/2018   INR 1.41 08/17/2018   HGBA1C 6.2 (H) 08/13/2018      Assessment / Plan:      Patient making good progress following recent inferior myocardial infarction followed by coronary artery bypass grafting She is making reasonable progress increasing activity appropriately, denies recurrent chest pain Does have some muscle aches which she attributes to her Lipitor She is enrolled in cardiac rehab to start in the next several weeks  She has continued follow-up with Dr. Percival Spanish and the lipid clinic.   [name] MD      [address] [place] [address] Office  [phone]   Beeper [phone]  09/27/2018 2:10 PM

## 2018-09-28 ENCOUNTER — Telehealth (HOSPITAL_COMMUNITY): Payer: Self-pay

## 2018-09-30 ENCOUNTER — Other Ambulatory Visit: Payer: Self-pay | Admitting: Family Medicine

## 2018-10-14 NOTE — Progress Notes (Signed)
Cardiology Office Note   Date:  10/15/2018   ID:  Pamela Thomas, DOB Nov 07, 1955, MRN [id number]  PCP:  Dickie La, MD  Cardiologist:   Minus Breeding, MD   No chief complaint on file.     History of Present Illness: Pamela Thomas is a 63 y.o. female who presents for evaluation of coronary disease.  She had an inferior infarct in [place].   She had an occluded right coronary artery.  She also had LAD 80% stenosis proximally at the takeoff of a prominent and tortuous diagonal branch.  Circumflex is very small and free of high-grade disease.  Her EF was well preserved with some probable mild inferior hypokinesis.  She had overlapping Synergy stents.  It was suggested that she have elective PCI of her LAD versus a LIMA.  She is now status post CABG.    She feels well since bypass.  She occasionally feels like she has to take a deep breath.  She did have a chest x-ray earlier this month at the surgeon's office and this was normal.  She is not describing cough fevers or chills.  She is not describing PND or orthopnea.  She has some sternal wound sharp shooting pains occasionally.  Overall she thinks she is done relatively well.  She is about to start cardiac rehab.  She denies any of the previous substernal chest pressure, neck or arm discomfort.  She said no palpitations, presyncope or syncope.   Past Medical History:  Diagnosis Date  . ALLERGIC RHINITIS, SEASONAL   . Diabetes mellitus without complication ([place])   . DYSLIPIDEMIA   . Dysrhythmia    palpitations-evaluated by Dr Johnsie Cancel 6/12/eccho 6/13  . Glucose intolerance (impaired glucose tolerance)   . History of blood transfusion   . Hypertension   . OBESITY, NOS   . Osteoarthritis    rt knee  . PLANTAR FASCIITIS, BILATERAL   . Vertigo     Past Surgical History:  Procedure Laterality Date  . BREAST BIOPSY  05/27/03   left  . CARPAL TUNNEL RELEASE  05/27/03   Right  . CESAREAN SECTION  05/27/03  . CORONARY ARTERY  BYPASS GRAFT N/A 08/17/2018   Procedure: CORONARY ARTERY BYPASS GRAFTING (CABG) TIMES TWO: LIMA to LAD, SVG to RAMUS INTERMEDIATE)  USING LEFT INTERNAL MAMMARY ARTERY AND RIGHT GREATER SAPHENOUS VEIN HARVESTED ENDOSCOPICALLY.;  Surgeon: [name], MD;  Location: [facility];  Service: Open Heart Surgery;  Laterality: N/A;  . KNEE SURGERY     left- removal fatty tumor  . ROTATOR CUFF REPAIR     Bilateral  . TEE WITHOUT CARDIOVERSION N/A 08/17/2018   Procedure: TRANSESOPHAGEAL ECHOCARDIOGRAM (TEE);  Surgeon: [name], MD;  Location: [facility];  Service: Open Heart Surgery;  Laterality: N/A;  . TOTAL KNEE ARTHROPLASTY  10/10/2012   Procedure: TOTAL KNEE ARTHROPLASTY;  Surgeon: Gearlean Alf, MD;  Location: [facility];  Service: Orthopedics;  Laterality: Right;     Current Outpatient Medications  Medication Sig Dispense Refill  . acetaminophen (TYLENOL) 325 MG tablet Take 2 tablets (650 mg total) by mouth every 6 (six) hours as needed for mild pain.    Marland Kitchen amLODipine (NORVASC) 10 MG tablet TAKE 1 TABLET BY MOUTH EVERY DAY 30 tablet 10  . aspirin 81 MG tablet Take 1 tablet (81 mg total) by mouth daily. (Patient taking differently: Take 81 mg by mouth every morning. ) 100 tablet 3  . atorvastatin (LIPITOR) 80 MG tablet Take  80 mg by mouth every evening.     . candesartan (ATACAND) 32 MG tablet Take 1 tablet (32 mg total) by mouth daily. (Patient taking differently: Take 32 mg by mouth every morning. ) 90 tablet 3  . FLUoxetine (PROZAC) 20 MG tablet TAKE 1 TABLET BY MOUTH EVERY DAY (Patient taking differently: Take 20 mg by mouth every morning. ) 90 tablet 3  . furosemide (LASIX) 20 MG tablet Take 1 tablet (20 mg total) by mouth 2 (two) times daily. Patient does not want to split a 40 mg  Tab so please do not substitute 180 tablet 3  . metFORMIN (GLUCOPHAGE) 1000 MG tablet TAKE 1 TABLET (1,000 MG TOTAL) BY MOUTH 2 (TWO) TIMES DAILY WITH A MEAL. 180 tablet 3  . metoprolol succinate (TOPROL-XL) 50 MG  24 hr tablet TAKE 1 TABLET BY MOUTH TWICE A DAY (Patient taking differently: Take 50 mg by mouth 2 (two) times daily. ) 180 tablet 3  . potassium chloride SA (KLOR-CON M20) 20 MEQ tablet Take 1 tablet (20 mEq total) by mouth daily. (Patient taking differently: Take 20 mEq by mouth every morning. ) 90 tablet 3  . prasugrel (EFFIENT) 10 MG TABS tablet Take 10 mg by mouth daily.    Marland Kitchen VASCEPA 1 g CAPS Take 1 capsule by mouth as directed.     No current facility-administered medications for this visit.     Allergies:   Morphine and related; Statins; and Oxycodone    ROS:  Please see the history of present illness.   Otherwise, review of systems are positive for none.   All other systems are reviewed and negative.    PHYSICAL EXAM: VS:  BP 100/60   Pulse 72   Ht 5\' 2"  (1.575 m)   Wt 236 lb 4 oz (107.2 kg)   LMP 10/10/2010   SpO2 94%   BMI 43.21 kg/m  , BMI Body mass index is 43.21 kg/m. GENERAL:  Well appearing NECK:  No jugular venous distention, waveform within normal limits, carotid upstroke brisk and symmetric, positive left-sided bruits, no thyromegaly LUNGS:  Clear to auscultation bilaterally CHEST:  Well healed sternotomy scar. HEART:  PMI not displaced or sustained,S1 and S2 within normal limits, no S3, no S4, no clicks, no rubs, soft apical early peaking systolic murmur, no diastolic murmurs ABD:  Flat, positive bowel sounds normal in frequency in pitch, no bruits, no rebound, no guarding, no midline pulsatile mass, no hepatomegaly, no splenomegaly EXT:  2 plus pulses throughout, no edema, no cyanosis no clubbing   EKG:  EKG is not ordered today.    Recent Labs: 08/13/2018: ALT 21; TSH 2.136 08/18/2018: Magnesium 2.3 08/25/2018: BUN 11; Creatinine, Ser 0.78; Hemoglobin 8.9; Platelets 283; Potassium 3.8; Sodium 142    Lipid Panel    Component Value Date/Time   CHOL 220 (H) 05/11/2011 0932   TRIG 576 (H) 05/11/2011 0932   HDL 40 05/11/2011 0932   CHOLHDL 5.5 05/11/2011  0932   VLDL NOT CALC 05/11/2011 0932   LDLCALC  05/11/2011 0932     Comment:       Not calculated due to Triglyceride >400. Suggest ordering Direct LDL (Unit Code: [phone]).   Total Cholesterol/HDL Ratio:CHD Risk                        Coronary Heart Disease Risk Table  Men       Women          1/2 Average Risk              3.4        3.3              Average Risk              5.0        4.4           2X Average Risk              9.6        7.1           3X Average Risk             23.4       11.0 Use the calculated Patient Ratio above and the CHD Risk table  to determine the patient's CHD Risk. ATP III Classification (LDL):       < 100        mg/dL         Optimal      100 - 129     mg/dL         Near or Above Optimal      130 - 159     mg/dL         Borderline High      160 - 189     mg/dL         High       > 190        mg/dL         Very High     LDLDIRECT 123 (H) 01/24/2018 0937   LDLDIRECT 76 07/27/2016 1048      Wt Readings from Last 3 Encounters:  10/15/18 236 lb 4 oz (107.2 kg)  09/27/18 235 lb 3.2 oz (106.7 kg)  09/04/18 232 lb 3.2 oz (105.3 kg)    Lab Results  Component Value Date   HGBA1C 6.2 (H) 08/13/2018    Other studies Reviewed: Additional studies/ records that were reviewed today include: Multiple [facility] records. Review of the above records demonstrates:  Please see elsewhere in the note.     ASSESSMENT AND PLAN:  CAD:   She is status post CABG.   she is doing relatively well.  She is to start cardiac rehab.  No change in therapy is indicated at this point.  Of note she will continue dual antiplatelet therapy until 1 year from the point of her acute MI.  DYSLIPIDEMIA:     She is followed by Dr. Debara Pickett in the [facility].  She has follow-up scheduled.  Aortic stenosis:  She has mild AS.  I will follow this clinically.   DM: Her A1c is 6.2.   Dickie La, MD   HTN:    Is running low.  If it continues to run low I  likely would back off on the Norvasc.  OBESITY: We will work on this with diet and exercise and I discussed the [place].  CAROTID STENOSIS:  40 - 59%.  This was preop.  She will have this followed in 1 year around December.  Current medicines are reviewed at length with the patient today.  The patient does not have concerns regarding medicines.  The following changes have been made:  None  Labs/ tests ordered today include:  None  No orders of the defined types  were placed in this encounter.    Disposition:   FU with me Jory Sims DNP in 6 months.     Signed, Minus Breeding, MD  10/15/2018 9:00 AM    [place]

## 2018-10-15 ENCOUNTER — Encounter: Payer: Self-pay | Admitting: Cardiology

## 2018-10-15 ENCOUNTER — Ambulatory Visit: Payer: BLUE CROSS/BLUE SHIELD | Admitting: Cardiology

## 2018-10-15 VITALS — BP 100/60 | HR 72 | Ht 62.0 in | Wt 236.2 lb

## 2018-10-15 DIAGNOSIS — E118 Type 2 diabetes mellitus with unspecified complications: Secondary | ICD-10-CM | POA: Diagnosis not present

## 2018-10-15 DIAGNOSIS — E7800 Pure hypercholesterolemia, unspecified: Secondary | ICD-10-CM

## 2018-10-15 DIAGNOSIS — I2510 Atherosclerotic heart disease of native coronary artery without angina pectoris: Secondary | ICD-10-CM | POA: Diagnosis not present

## 2018-10-15 DIAGNOSIS — Z951 Presence of aortocoronary bypass graft: Secondary | ICD-10-CM | POA: Diagnosis not present

## 2018-10-15 DIAGNOSIS — I6523 Occlusion and stenosis of bilateral carotid arteries: Secondary | ICD-10-CM | POA: Insufficient documentation

## 2018-10-15 DIAGNOSIS — Z9889 Other specified postprocedural states: Secondary | ICD-10-CM

## 2018-10-15 NOTE — Patient Instructions (Signed)
Medication Instructions:  Continue current medications  If you need a refill on your cardiac medications before your next appointment, please call your pharmacy.  Labwork: None Ordered   Take the provided lab slips with you to the lab for your blood draw.   When you have your labs (blood work) drawn today and your tests are completely normal, you will receive your results only by MyChart Message (if you have MyChart) -OR-  A paper copy in the mail.  If you have any lab test that is abnormal or we need to change your treatment, we will call you to review these results.  Testing/Procedures: None Ordered  Follow-Up: You will need a follow up appointment in 6 months.  Please call our office 2 months in advance to schedule this appointment.   Jory Sims, DNP, ANP   At [facility], you and your health needs are our priority.  As part of our continuing mission to provide you with exceptional heart care, we have created designated Provider Care Teams.  These Care Teams include your primary Cardiologist (physician) and Advanced Practice Providers (APPs -  Physician Assistants and Nurse Practitioners) who all work together to provide you with the care you need, when you need it.  Thank you for choosing [facility]!!

## 2018-10-16 ENCOUNTER — Telehealth (HOSPITAL_COMMUNITY): Payer: Self-pay

## 2018-10-16 NOTE — Telephone Encounter (Signed)
Cardiac Rehab Medication Review by a Pharmacist  Does the patient feel that his/her medications are working for him/her?  yes  Has the patient been experiencing any side effects to the medications prescribed?  no  Does the patient measure his/her own blood pressure or blood glucose at home?  no   Does the patient have any problems obtaining medications due to transportation or finances?   no  Understanding of regimen: good Understanding of indications: good Potential of compliance: good    Pharmacist comments: N/A    Jackson Latino, PharmD PGY1 Pharmacy Resident Phone [phone] 10/16/2018     4:22 PM

## 2018-10-17 ENCOUNTER — Other Ambulatory Visit: Payer: Self-pay | Admitting: Surgical

## 2018-10-19 NOTE — Progress Notes (Signed)
Pamela Thomas 63 y.o. female DOB [birth year] MRN [id number]       Nutrition Screen Note  No diagnosis found. Past Medical History:  Diagnosis Date  . ALLERGIC RHINITIS, SEASONAL   . Diabetes mellitus without complication ([place])   . DYSLIPIDEMIA   . Dysrhythmia    palpitations-evaluated by Dr Johnsie Cancel 6/12/eccho 6/13  . Glucose intolerance (impaired glucose tolerance)   . History of blood transfusion   . Hypertension   . OBESITY, NOS   . Osteoarthritis    rt knee  . PLANTAR FASCIITIS, BILATERAL   . Vertigo  T2DM     Meds reviewed.    Current Outpatient Medications (Endocrine & Metabolic):  .  metFORMIN (GLUCOPHAGE) 1000 MG tablet, TAKE 1 TABLET (1,000 MG TOTAL) BY MOUTH 2 (TWO) TIMES DAILY WITH A MEAL.  Current Outpatient Medications (Cardiovascular):  .  amLODipine (NORVASC) 10 MG tablet, TAKE 1 TABLET BY MOUTH EVERY DAY .  atorvastatin (LIPITOR) 80 MG tablet, Take 80 mg by mouth every evening.  .  candesartan (ATACAND) 32 MG tablet, Take 1 tablet (32 mg total) by mouth daily. (Patient taking differently: Take 32 mg by mouth every morning. ) .  furosemide (LASIX) 20 MG tablet, Take 1 tablet (20 mg total) by mouth 2 (two) times daily. Patient does not want to split a 40 mg  Tab so please do not substitute .  metoprolol succinate (TOPROL-XL) 50 MG 24 hr tablet, TAKE 1 TABLET BY MOUTH TWICE A DAY (Patient taking differently: Take 50 mg by mouth 2 (two) times daily. ) .  VASCEPA 1 g CAPS, Take 1 capsule by mouth as directed.   Current Outpatient Medications (Analgesics):  .  acetaminophen (TYLENOL) 325 MG tablet, Take 2 tablets (650 mg total) by mouth every 6 (six) hours as needed for mild pain. Marland Kitchen  aspirin 81 MG tablet, Take 1 tablet (81 mg total) by mouth daily. (Patient taking differently: Take 81 mg by mouth every morning. )  Current Outpatient Medications (Hematological):  .  prasugrel (EFFIENT) 10 MG TABS tablet, Take 10 mg by mouth daily.  Current Outpatient  Medications (Other):  Marland Kitchen  FLUoxetine (PROZAC) 20 MG tablet, TAKE 1 TABLET BY MOUTH EVERY DAY (Patient taking differently: Take 20 mg by mouth every morning. ) .  potassium chloride SA (KLOR-CON M20) 20 MEQ tablet, Take 1 tablet (20 mEq total) by mouth daily. (Patient taking differently: Take 20 mEq by mouth every morning. )   HT: Ht Readings from Last 1 Encounters:  10/15/18 5\' 2"  (1.575 m)    WT: Wt Readings from Last 5 Encounters:  10/15/18 236 lb 4 oz (107.2 kg)  09/27/18 235 lb 3.2 oz (106.7 kg)  09/04/18 232 lb 3.2 oz (105.3 kg)  08/26/18 238 lb 5.1 oz (108.1 kg)  08/07/18 237 lb (107.5 kg)     BMI = 43.2  10/15/18  Current tobacco use? No       Labs:  Lipid Panel     Component Value Date/Time   CHOL 220 (H) 05/11/2011 0932   TRIG 576 (H) 05/11/2011 0932   HDL 40 05/11/2011 0932   CHOLHDL 5.5 05/11/2011 0932   VLDL NOT CALC 05/11/2011 0932   LDLCALC  05/11/2011 0932     Comment:       Not calculated due to Triglyceride >400. Suggest ordering Direct LDL (Unit Code: [phone]).   Total Cholesterol/HDL Ratio:CHD Risk  Coronary Heart Disease Risk Table                                        Men       Women          1/2 Average Risk              3.4        3.3              Average Risk              5.0        4.4           2X Average Risk              9.6        7.1           3X Average Risk             23.4       11.0 Use the calculated Patient Ratio above and the CHD Risk table  to determine the patient's CHD Risk. ATP III Classification (LDL):       < 100        mg/dL         Optimal      100 - 129     mg/dL         Near or Above Optimal      130 - 159     mg/dL         Borderline High      160 - 189     mg/dL         High       > 190        mg/dL         Very High     LDLDIRECT 123 (H) 01/24/2018 0937   LDLDIRECT 76 07/27/2016 1048    Lab Results  Component Value Date   HGBA1C 6.2 (H) 08/13/2018   CBG (last 3)  No results for input(s):  GLUCAP in the last 72 hours.  Nutrition Diagnosis ? Food-and nutrition-related knowledge deficit related to lack of exposure to information as related to diagnosis of: ? CVD ? Type 2 Diabetes ? Obese  III = 40+ related to excessive energy intake as evidenced by a BMI = 43.2  10/15/18  Nutrition Goal(s):  ? To be determined  Plan:  Pt to attend nutrition classes ? Nutrition I ? Nutrition II ? Portion Distortion  ? Diabetes Blitz ? Diabetes Q & A Will provide client-centered nutrition education as part of interdisciplinary care.   Monitor and evaluate progress toward nutrition goal with team.  Laurina Bustle, MS, RD, LDN 10/19/2018 12:16 PM

## 2018-10-23 ENCOUNTER — Encounter (HOSPITAL_COMMUNITY)
Admission: RE | Admit: 2018-10-23 | Discharge: 2018-10-23 | Disposition: A | Discharge status: Home / Self Care | Payer: BLUE CROSS/BLUE SHIELD | Source: Ambulatory Visit | Attending: Cardiology | Admitting: Cardiology

## 2018-10-23 ENCOUNTER — Encounter (HOSPITAL_COMMUNITY): Payer: Self-pay

## 2018-10-23 VITALS — BP 116/50 | HR 73 | Ht 62.0 in | Wt 237.2 lb

## 2018-10-23 DIAGNOSIS — Z951 Presence of aortocoronary bypass graft: Secondary | ICD-10-CM | POA: Diagnosis not present

## 2018-10-23 HISTORY — DX: Atherosclerotic heart disease of native coronary artery without angina pectoris: I25.10

## 2018-10-23 LAB — GLUCOSE, CAPILLARY: Glucose-Capillary: 208 mg/dL — ABNORMAL HIGH (ref 70–99)

## 2018-10-23 NOTE — Progress Notes (Addendum)
Cardiac Individual Treatment Plan  Patient Details  Name: Pamela Thomas MRN: [id number] Date of Birth: 10-16-1955 Referring Provider:     CARDIAC REHAB PHASE II ORIENTATION from 10/23/2018 in [place]  Referring Provider  Dr. Percival Spanish      Initial Encounter Date:    CARDIAC REHAB PHASE II ORIENTATION from 10/23/2018 in [place]  Date  10/23/18      Visit Diagnosis: 08/17/18 S/P CABG x 2  Patient's Home Medications on Admission:  Current Outpatient Medications:  .  acetaminophen (TYLENOL) 325 MG tablet, Take 2 tablets (650 mg total) by mouth every 6 (six) hours as needed for mild pain., Disp: , Rfl:  .  amLODipine (NORVASC) 10 MG tablet, TAKE 1 TABLET BY MOUTH EVERY DAY, Disp: 30 tablet, Rfl: 10 .  aspirin 81 MG tablet, Take 1 tablet (81 mg total) by mouth daily. (Patient taking differently: Take 81 mg by mouth every morning. ), Disp: 100 tablet, Rfl: 3 .  atorvastatin (LIPITOR) 80 MG tablet, Take 80 mg by mouth every evening. , Disp: , Rfl:  .  candesartan (ATACAND) 32 MG tablet, Take 1 tablet (32 mg total) by mouth daily. (Patient taking differently: Take 32 mg by mouth every morning. ), Disp: 90 tablet, Rfl: 3 .  FLUoxetine (PROZAC) 20 MG tablet, TAKE 1 TABLET BY MOUTH EVERY DAY (Patient taking differently: Take 20 mg by mouth every morning. ), Disp: 90 tablet, Rfl: 3 .  furosemide (LASIX) 20 MG tablet, Take 1 tablet (20 mg total) by mouth 2 (two) times daily. Patient does not want to split a 40 mg  Tab so please do not substitute, Disp: 180 tablet, Rfl: 3 .  metFORMIN (GLUCOPHAGE) 1000 MG tablet, TAKE 1 TABLET (1,000 MG TOTAL) BY MOUTH 2 (TWO) TIMES DAILY WITH A MEAL., Disp: 180 tablet, Rfl: 3 .  metoprolol succinate (TOPROL-XL) 50 MG 24 hr tablet, TAKE 1 TABLET BY MOUTH TWICE A DAY (Patient taking differently: Take 50 mg by mouth 2 (two) times daily. ), Disp: 180 tablet, Rfl: 3 .  potassium chloride SA (KLOR-CON  M20) 20 MEQ tablet, Take 1 tablet (20 mEq total) by mouth daily. (Patient taking differently: Take 20 mEq by mouth every morning. ), Disp: 90 tablet, Rfl: 3 .  prasugrel (EFFIENT) 10 MG TABS tablet, Take 10 mg by mouth daily., Disp: , Rfl:  .  VASCEPA 1 g CAPS, Take 1 capsule by mouth as directed., Disp: , Rfl:   Past Medical History: Past Medical History:  Diagnosis Date  . ALLERGIC RHINITIS, SEASONAL   . Coronary artery disease   . Diabetes mellitus without complication ([place])   . DYSLIPIDEMIA   . Dysrhythmia    palpitations-evaluated by Dr Johnsie Cancel 6/12/[facility] 6/13  . Glucose intolerance (impaired glucose tolerance)   . History of blood transfusion   . Hypertension   . OBESITY, NOS   . Osteoarthritis    rt knee  . PLANTAR FASCIITIS, BILATERAL   . Vertigo     Tobacco Use: Social History   Tobacco Use  Smoking Status Former Smoker  . Last attempt to quit: 09/12/1990  . Years since quitting: 28.1  Smokeless Tobacco Never Used    Labs: Recent Chemical engineer    Labs for ITP Cardiac and Pulmonary Rehab Latest Ref Rng & Units 08/18/2018 08/18/2018 08/18/2018 08/20/2018 08/21/2018   Cholestrol 0 - 200 mg/dL - - - - -   LDLCALC 0 - 99 mg/dL - - - - -  LDLDIRECT 0 - 99 mg/dL - - - - -   HDL >39 mg/dL - - - - -   Trlycerides <150 mg/dL - - - - -   Hemoglobin A1c 4.8 - 5.6 % - - - - -   PHART 7.350 - 7.450 7.348(L) 7.339(L) - 7.234(L) 7.311(L)   PCO2ART 32.0 - 48.0 mmHg 44.2 46.8 - 62.1(H) 57.7(H)   HCO3 20.0 - 28.0 mmol/L 24.4 25.3 - 26.4 28.3(H)   TCO2 22 - 32 mmol/L 26 27 23 28  -   ACIDBASEDEF 0.0 - 2.0 mmol/L 1.0 1.0 - 2.0 -   O2SAT % 99.0 96.0 - 97.0 99.5      Capillary Blood Glucose: Lab Results  Component Value Date   GLUCAP 208 (H) 10/23/2018   GLUCAP 143 (H) 08/26/2018   GLUCAP 112 (H) 08/25/2018   GLUCAP 174 (H) 08/25/2018   GLUCAP 153 (H) 08/25/2018     Exercise Target Goals: Exercise Program Goal: Individual exercise prescription set using results from  initial 6 min walk test and THRR while considering  patient's activity barriers and safety.   Exercise Prescription Goal: Initial exercise prescription builds to 30-45 minutes a day of aerobic activity, 2-3 days per week.  Home exercise guidelines will be given to patient during program as part of exercise prescription that the participant will acknowledge.  Activity Barriers & Risk Stratification: Activity Barriers & Cardiac Risk Stratification - 10/23/18 0825      Activity Barriers & Cardiac Risk Stratification   Activity Barriers  Muscular Weakness;Deconditioning;History of Falls;Balance Concerns;Right Knee Replacement;Joint Problems;Other (comment)    Comments  Lack of mobility in left shoulder. Left knee pain    Cardiac Risk Stratification  High       6 Minute Walk: 6 Minute Walk    Row Name 10/23/18 0958         6 Minute Walk   Phase  Initial     Distance  1112 feet     Walk Time  6 minutes     # of Rest Breaks  1 30 seconds.     MPH  2.1     METS  2.3     RPE  15     Perceived Dyspnea   3     VO2 Peak  8.11     Symptoms  Yes (comment)     Comments  SOB. Rest break for 30 seconds, started using rollator.      Resting HR  73 bpm     Resting BP  116/50     Resting Oxygen Saturation   97 %     Exercise Oxygen Saturation  during 6 min walk  88 %     Max Ex. HR  111 bpm     Max Ex. BP  148/68     2 Minute Post BP  106/59        Oxygen Initial Assessment:   Oxygen Re-Evaluation:   Oxygen Discharge (Final Oxygen Re-Evaluation):   Initial Exercise Prescription: Initial Exercise Prescription - 10/23/18 1000      Date of Initial Exercise RX and Referring Provider   Date  10/23/18    Referring Provider  Dr. Percival Spanish    Expected Discharge Date  01/28/19      NuStep   Level  2    SPM  75    Minutes  10    METs  2      Arm Ergometer   Level  1  Watts  40    Minutes  10    METs  3      Track   Laps  8    Minutes  10    METs  2.38      Prescription  Details   Frequency (times per week)  3    Duration  Progress to 30 minutes of continuous aerobic without signs/symptoms of physical distress      Intensity   THRR 40-80% of Max Heartrate  63-126    Ratings of Perceived Exertion  11-13    Perceived Dyspnea  0-4      Progression   Progression  Continue to progress workloads to maintain intensity without signs/symptoms of physical distress.      Resistance Training   Training Prescription  Yes    Weight  2 lbs.     Reps  10-15       Perform Capillary Blood Glucose checks as needed.  Exercise Prescription Changes:   Exercise Comments:   Exercise Goals and Review: Exercise Goals    Row Name 10/23/18 0825             Exercise Goals   Increase Physical Activity  Yes       Intervention  Provide advice, education, support and counseling about physical activity/exercise needs.;Develop an individualized exercise prescription for aerobic and resistive training based on initial evaluation findings, risk stratification, comorbidities and participant's personal goals.       Expected Outcomes  Short Term: Attend rehab on a regular basis to increase amount of physical activity.       Increase Strength and Stamina  Yes       Intervention  Provide advice, education, support and counseling about physical activity/exercise needs.;Develop an individualized exercise prescription for aerobic and resistive training based on initial evaluation findings, risk stratification, comorbidities and participant's personal goals.       Expected Outcomes  Short Term: Increase workloads from initial exercise prescription for resistance, speed, and METs.       Able to understand and use rate of perceived exertion (RPE) scale  Yes       Intervention  Provide education and explanation on how to use RPE scale       Expected Outcomes  Short Term: Able to use RPE daily in rehab to express subjective intensity level;Long Term:  Able to use RPE to guide intensity  level when exercising independently       Knowledge and understanding of Target Heart Rate Range (THRR)  Yes       Intervention  Provide education and explanation of THRR including how the numbers were predicted and where they are located for reference       Expected Outcomes  Short Term: Able to state/look up THRR;Long Term: Able to use THRR to govern intensity when exercising independently;Short Term: Able to use daily as guideline for intensity in rehab       Able to check pulse independently  Yes       Intervention  Provide education and demonstration on how to check pulse in carotid and radial arteries.;Review the importance of being able to check your own pulse for safety during independent exercise       Expected Outcomes  Short Term: Able to explain why pulse checking is important during independent exercise;Long Term: Able to check pulse independently and accurately       Understanding of Exercise Prescription  Yes       Intervention  Provide  education, explanation, and written materials on patient's individual exercise prescription       Expected Outcomes  Short Term: Able to explain program exercise prescription;Long Term: Able to explain home exercise prescription to exercise independently          Exercise Goals Re-Evaluation :   Discharge Exercise Prescription (Final Exercise Prescription Changes):   Nutrition:  Target Goals: Understanding of nutrition guidelines, daily intake of sodium 1500mg , cholesterol 200mg , calories 30% from fat and 7% or less from saturated fats, daily to have 5 or more servings of fruits and vegetables.  Biometrics: Pre Biometrics - 10/23/18 1002      Pre Biometrics   Height  5\' 2"  (1.575 m)    Weight  107.6 kg    Waist Circumference  45 inches    Hip Circumference  52 inches    Waist to Hip Ratio  0.87 %    BMI (Calculated)  43.38    Triceps Skinfold  48 mm    % Body Fat  54.2 %    Grip Strength  28 kg    Flexibility  10.5 in    Single Leg  Stand  1.43 seconds        Nutrition Therapy Plan and Nutrition Goals: Nutrition Therapy & Goals - 10/23/18 1055      Nutrition Therapy   Diet  heart healthy, carb modified      Personal Nutrition Goals   Nutrition Goal  Pt to identify and limit food sources of saturated fat, trans fat, refined carbohydrates and sodium    Personal Goal #2  Pt to identify food quantities necessary to achieve weight loss of 6-24 lb at graduation from cardiac rehab.    Personal Goal #3  Pt to eat a variety of non-starchy vegetables.    Personal Goal #4  Pt to watch out for sweets and added sugar      Intervention Plan   Intervention  Prescribe, educate and counsel regarding individualized specific dietary modifications aiming towards targeted core components such as weight, hypertension, lipid management, diabetes, heart failure and other comorbidities.    Expected Outcomes  Short Term Goal: Understand basic principles of dietary content, such as calories, fat, sodium, cholesterol and nutrients.;Long Term Goal: Adherence to prescribed nutrition plan.       Nutrition Assessments: Nutrition Assessments - 10/23/18 1057      MEDFICTS Scores   Pre Score  60       Nutrition Goals Re-Evaluation: Nutrition Goals Re-Evaluation    Row Name 10/23/18 1055             Goals   Current Weight  237 lb 3.4 oz (107.6 kg)          Nutrition Goals Re-Evaluation: Nutrition Goals Re-Evaluation    [place] Name 10/23/18 1055             Goals   Current Weight  237 lb 3.4 oz (107.6 kg)          Nutrition Goals Discharge (Final Nutrition Goals Re-Evaluation): Nutrition Goals Re-Evaluation - 10/23/18 1055      Goals   Current Weight  237 lb 3.4 oz (107.6 kg)       Psychosocial: Target Goals: Acknowledge presence or absence of significant depression and/or stress, maximize coping skills, provide positive support system. Participant is able to verbalize types and ability to use techniques and skills  needed for reducing stress and depression.  Initial Review & Psychosocial Screening: Initial Psych Review & Screening -  10/23/18 1049      Initial Review   Current issues with  None Identified      Family Dynamics   Good Support System?  Yes   [place] has her spouse, siblings, children and friends for support     Barriers   Psychosocial barriers to participate in program  There are no identifiable barriers or psychosocial needs.      Screening Interventions   Interventions  Encouraged to exercise       Quality of Life Scores: Quality of Life - 10/23/18 1004      Quality of Life   Select  Quality of Life      Quality of Life Scores   Health/Function Pre  28 %    Socioeconomic Pre  28 %    Psych/Spiritual Pre  30 %    Family Pre  30 %    GLOBAL Pre  28.71 %      Scores of 19 and below usually indicate a poorer quality of life in these areas.  A difference of  2-3 points is a clinically meaningful difference.  A difference of 2-3 points in the total score of the Quality of Life Index has been associated with significant improvement in overall quality of life, self-image, physical symptoms, and general health in studies assessing change in quality of life.  PHQ-9: Recent Review Flowsheet Data    Depression screen [facility] 2/9 02/28/2018 01/24/2018 01/12/2018 07/27/2016 08/12/2015   Decreased Interest 0 0 0 0 0   Down, Depressed, Hopeless 0 0 0 0 0   PHQ - 2 Score 0 0 0 0 0     Interpretation of Total Score  Total Score Depression Severity:  1-4 = Minimal depression, 5-9 = Mild depression, 10-14 = Moderate depression, 15-19 = Moderately severe depression, 20-27 = Severe depression   Psychosocial Evaluation and Intervention:   Psychosocial Re-Evaluation:   Psychosocial Discharge (Final Psychosocial Re-Evaluation):   Vocational Rehabilitation: Provide vocational rehab assistance to qualifying candidates.   Vocational Rehab Evaluation & Intervention: Vocational Rehab -  10/23/18 1051      Initial Vocational Rehab Evaluation & Intervention   Assessment shows need for Vocational Rehabilitation  No   Jayanna is retired and does not need vocational rehab at this time.      Education: Education Goals: Education classes will be provided on a weekly basis, covering required topics. Participant will state understanding/return demonstration of topics presented.  Learning Barriers/Preferences: Learning Barriers/Preferences - 10/23/18 0859      Learning Barriers/Preferences   Learning Barriers  None    Learning Preferences  Individual Instruction;Group Instruction       Education Topics: Count Your Pulse:  -Group instruction provided by verbal instruction, demonstration, patient participation and written materials to support subject.  Instructors address importance of being able to find your pulse and how to count your pulse when at home without a heart monitor.  Patients get hands on experience counting their pulse with staff help and individually.   Heart Attack, Angina, and Risk Factor Modification:  -Group instruction provided by verbal instruction, video, and written materials to support subject.  Instructors address signs and symptoms of angina and heart attacks.    Also discuss risk factors for heart disease and how to make changes to improve heart health risk factors.   Functional Fitness:  -Group instruction provided by verbal instruction, demonstration, patient participation, and written materials to support subject.  Instructors address safety measures for doing things around the house.  Discuss how to get up and down off the floor, how to pick things up properly, how to safely get out of a chair without assistance, and balance training.   Meditation and Mindfulness:  -Group instruction provided by verbal instruction, patient participation, and written materials to support subject.  Instructor addresses importance of mindfulness and meditation  practice to help reduce stress and improve awareness.  Instructor also leads participants through a meditation exercise.    Stretching for Flexibility and Mobility:  -Group instruction provided by verbal instruction, patient participation, and written materials to support subject.  Instructors lead participants through series of stretches that are designed to increase flexibility thus improving mobility.  These stretches are additional exercise for major muscle groups that are typically performed during regular warm up and cool down.   Hands Only CPR:  -Group verbal, video, and participation provides a basic overview of AHA guidelines for community CPR. Role-play of emergencies allow participants the opportunity to practice calling for help and chest compression technique with discussion of AED use.   Hypertension: -Group verbal and written instruction that provides a basic overview of hypertension including the most recent diagnostic guidelines, risk factor reduction with self-care instructions and medication management.    Nutrition I class: Heart Healthy Eating:  -Group instruction provided by PowerPoint slides, verbal discussion, and written materials to support subject matter. The instructor gives an explanation and review of the Therapeutic Lifestyle Changes diet recommendations, which includes a discussion on lipid goals, dietary fat, sodium, fiber, plant stanol/sterol esters, sugar, and the components of a well-balanced, healthy diet.   Nutrition II class: Lifestyle Skills:  -Group instruction provided by PowerPoint slides, verbal discussion, and written materials to support subject matter. The instructor gives an explanation and review of label reading, grocery shopping for heart health, heart healthy recipe modifications, and ways to make healthier choices when eating out.   Diabetes Question & Answer:  -Group instruction provided by PowerPoint slides, verbal discussion, and  written materials to support subject matter. The instructor gives an explanation and review of diabetes co-morbidities, pre- and post-prandial blood glucose goals, pre-exercise blood glucose goals, signs, symptoms, and treatment of hypoglycemia and hyperglycemia, and foot care basics.   Diabetes Blitz:  -Group instruction provided by PowerPoint slides, verbal discussion, and written materials to support subject matter. The instructor gives an explanation and review of the physiology behind type 1 and type 2 diabetes, diabetes medications and rational behind using different medications, pre- and post-prandial blood glucose recommendations and Hemoglobin A1c goals, diabetes diet, and exercise including blood glucose guidelines for exercising safely.    Portion Distortion:  -Group instruction provided by PowerPoint slides, verbal discussion, written materials, and food models to support subject matter. The instructor gives an explanation of serving size versus portion size, changes in portions sizes over the last 20 years, and what consists of a serving from each food group.   Stress Management:  -Group instruction provided by verbal instruction, video, and written materials to support subject matter.  Instructors review role of stress in heart disease and how to cope with stress positively.     Exercising on Your Own:  -Group instruction provided by verbal instruction, power point, and written materials to support subject.  Instructors discuss benefits of exercise, components of exercise, frequency and intensity of exercise, and end points for exercise.  Also discuss use of nitroglycerin and activating EMS.  Review options of places to exercise outside of rehab.  Review guidelines for sex with heart disease.  Cardiac Drugs I:  -Group instruction provided by verbal instruction and written materials to support subject.  Instructor reviews cardiac drug classes: antiplatelets, anticoagulants, beta  blockers, and statins.  Instructor discusses reasons, side effects, and lifestyle considerations for each drug class.   Cardiac Drugs II:  -Group instruction provided by verbal instruction and written materials to support subject.  Instructor reviews cardiac drug classes: angiotensin converting enzyme inhibitors (ACE-I), angiotensin II receptor blockers (ARBs), nitrates, and calcium channel blockers.  Instructor discusses reasons, side effects, and lifestyle considerations for each drug class.   Anatomy and Physiology of the Circulatory System:  Group verbal and written instruction and models provide basic cardiac anatomy and physiology, with the coronary electrical and arterial systems. Review of: AMI, Angina, Valve disease, Heart Failure, Peripheral Artery Disease, Cardiac Arrhythmia, Pacemakers, and the ICD.   Other Education:  -Group or individual verbal, written, or video instructions that support the educational goals of the cardiac rehab program.   Holiday Eating Survival Tips:  -Group instruction provided by PowerPoint slides, verbal discussion, and written materials to support subject matter. The instructor gives patients tips, tricks, and techniques to help them not only survive but enjoy the holidays despite the onslaught of food that accompanies the holidays.   Knowledge Questionnaire Score: Knowledge Questionnaire Score - 10/23/18 0857      Knowledge Questionnaire Score   Pre Score  20/24       Core Components/Risk Factors/Patient Goals at Admission: Personal Goals and Risk Factors at Admission - 10/23/18 0858      Core Components/Risk Factors/Patient Goals on Admission    Weight Management  Yes;Obesity;Weight Maintenance;Weight Loss    Intervention  Weight Management: Develop a combined nutrition and exercise program designed to reach desired caloric intake, while maintaining appropriate intake of nutrient and fiber, sodium and fats, and appropriate energy expenditure  required for the weight goal.;Weight Management: Provide education and appropriate resources to help participant work on and attain dietary goals.;Weight Management/Obesity: Establish reasonable short term and long term weight goals.;Obesity: Provide education and appropriate resources to help participant work on and attain dietary goals.    Admit Weight  237 lb 3.4 oz (107.6 kg)    Goal Weight: Short Term  217 lb (98.4 kg)    Goal Weight: Long Term  187 lb (84.8 kg)    Expected Outcomes  Short Term: Continue to assess and modify interventions until short term weight is achieved;Long Term: Adherence to nutrition and physical activity/exercise program aimed toward attainment of established weight goal;Weight Maintenance: Understanding of the daily nutrition guidelines, which includes 25-35% calories from fat, 7% or less cal from saturated fats, less than 200mg  cholesterol, less than 1.5gm of sodium, & 5 or more servings of fruits and vegetables daily;Weight Loss: Understanding of general recommendations for a balanced deficit meal plan, which promotes 1-2 lb weight loss per week and includes a negative energy balance of [phone] kcal/d;Understanding recommendations for meals to include 15-35% energy as protein, 25-35% energy from fat, 35-60% energy from carbohydrates, less than 200mg  of dietary cholesterol, 20-35 gm of total fiber daily;Understanding of distribution of calorie intake throughout the day with the consumption of 4-5 meals/snacks    Improve shortness of breath with ADL's  Yes    Intervention  Provide education, individualized exercise plan and daily activity instruction to help decrease symptoms of SOB with activities of daily living.    Expected Outcomes  Short Term: Improve cardiorespiratory fitness to achieve a reduction of symptoms when performing ADLs;Long Term: Be able  to perform more ADLs without symptoms or delay the onset of symptoms    Diabetes  Yes    Intervention  Provide education  about signs/symptoms and action to take for hypo/hyperglycemia.;Provide education about proper nutrition, including hydration, and aerobic/resistive exercise prescription along with prescribed medications to achieve blood glucose in normal ranges: Fasting glucose 65-99 mg/dL    Expected Outcomes  Short Term: Participant verbalizes understanding of the signs/symptoms and immediate care of hyper/hypoglycemia, proper foot care and importance of medication, aerobic/resistive exercise and nutrition plan for blood glucose control.;Long Term: Attainment of HbA1C < 7%.    Hypertension  Yes    Intervention  Provide education on lifestyle modifcations including regular physical activity/exercise, weight management, moderate sodium restriction and increased consumption of fresh fruit, vegetables, and low fat dairy, alcohol moderation, and smoking cessation.;Monitor prescription use compliance.    Expected Outcomes  Short Term: Continued assessment and intervention until BP is < 140/47mm HG in hypertensive participants. < 130/63mm HG in hypertensive participants with diabetes, heart failure or chronic kidney disease.;Long Term: Maintenance of blood pressure at goal levels.    Lipids  Yes    Intervention  Provide education and support for participant on nutrition & aerobic/resistive exercise along with prescribed medications to achieve LDL 70mg , HDL >40mg .    Expected Outcomes  Short Term: Participant states understanding of desired cholesterol values and is compliant with medications prescribed. Participant is following exercise prescription and nutrition guidelines.;Long Term: Cholesterol controlled with medications as prescribed, with individualized exercise RX and with personalized nutrition plan. Value goals: LDL < 70mg , HDL > 40 mg.    Stress  Yes    Intervention  Offer individual and/or small group education and counseling on adjustment to heart disease, stress management and health-related lifestyle change.  Teach and support self-help strategies.;Refer participants experiencing significant psychosocial distress to appropriate mental health specialists for further evaluation and treatment. When possible, include family members and significant others in education/counseling sessions.    Expected Outcomes  Short Term: Participant demonstrates changes in health-related behavior, relaxation and other stress management skills, ability to obtain effective social support, and compliance with psychotropic medications if prescribed.;Long Term: Emotional wellbeing is indicated by absence of clinically significant psychosocial distress or social isolation.       Core Components/Risk Factors/Patient Goals Review:    Core Components/Risk Factors/Patient Goals at Discharge (Final Review):    ITP Comments: ITP Comments    Row Name 10/23/18 0823           ITP Comments  Dr. Fransico Him, Medical Director          Comments: Liddie  attended orientation from [phone] to [phone] to review rules and guidelines for program. Completed 6 minute walk test, Intitial ITP, and exercise prescription.  VSS. Telemetry-Sinus Rhythm. Batsheva's oxygen saturation was 97% pre exercise. Nikeria did desaturate to 88% on room air. Aniesa's oxygen saturations rebounded to 93% on room air after rest. Jazzmen stopped to take a 30 minute rest break and used the rollator for the remainder of her walk test without further difficulty.Arletta does not have a CBG meter. CBG was checked this morning at cardiac rehab at  208.patient was given a CBG meter for home use. Barnet Pall, RN,BSN 10/23/2018 11:12 AM

## 2018-10-23 NOTE — Progress Notes (Signed)
Pamela Thomas 63 y.o. female DOB: 1956/01/05 MRN: [id number]      Nutrition Note  1. 08/17/18 S/P CABG x 2    Past Medical History:  Diagnosis Date  . ALLERGIC RHINITIS, SEASONAL   . Diabetes mellitus without complication ([place])   . DYSLIPIDEMIA   . Dysrhythmia    palpitations-evaluated by Dr Johnsie Cancel 6/12/eccho 6/13  . Glucose intolerance (impaired glucose tolerance)   . History of blood transfusion   . Hypertension   . OBESITY, NOS   . Osteoarthritis    rt knee  . PLANTAR FASCIITIS, BILATERAL   . Vertigo    Meds reviewed.   Current Outpatient Medications (Endocrine & Metabolic):  .  metFORMIN (GLUCOPHAGE) 1000 MG tablet, TAKE 1 TABLET (1,000 MG TOTAL) BY MOUTH 2 (TWO) TIMES DAILY WITH A MEAL.  Current Outpatient Medications (Cardiovascular):  .  amLODipine (NORVASC) 10 MG tablet, TAKE 1 TABLET BY MOUTH EVERY DAY .  atorvastatin (LIPITOR) 80 MG tablet, Take 80 mg by mouth every evening.  .  candesartan (ATACAND) 32 MG tablet, Take 1 tablet (32 mg total) by mouth daily. (Patient taking differently: Take 32 mg by mouth every morning. ) .  furosemide (LASIX) 20 MG tablet, Take 1 tablet (20 mg total) by mouth 2 (two) times daily. Patient does not want to split a 40 mg  Tab so please do not substitute .  metoprolol succinate (TOPROL-XL) 50 MG 24 hr tablet, TAKE 1 TABLET BY MOUTH TWICE A DAY (Patient taking differently: Take 50 mg by mouth 2 (two) times daily. ) .  VASCEPA 1 g CAPS, Take 1 capsule by mouth as directed.   Current Outpatient Medications (Analgesics):  .  acetaminophen (TYLENOL) 325 MG tablet, Take 2 tablets (650 mg total) by mouth every 6 (six) hours as needed for mild pain. Marland Kitchen  aspirin 81 MG tablet, Take 1 tablet (81 mg total) by mouth daily. (Patient taking differently: Take 81 mg by mouth every morning. )  Current Outpatient Medications (Hematological):  .  prasugrel (EFFIENT) 10 MG TABS tablet, Take 10 mg by mouth daily.  Current Outpatient Medications (Other):   Marland Kitchen  FLUoxetine (PROZAC) 20 MG tablet, TAKE 1 TABLET BY MOUTH EVERY DAY (Patient taking differently: Take 20 mg by mouth every morning. ) .  potassium chloride SA (KLOR-CON M20) 20 MEQ tablet, Take 1 tablet (20 mEq total) by mouth daily. (Patient taking differently: Take 20 mEq by mouth every morning. )   HT: Ht Readings from Last 1 Encounters:  10/23/18 5\' 2"  (1.575 m)    WT: Wt Readings from Last 5 Encounters:  10/23/18 237 lb 3.4 oz (107.6 kg)  10/15/18 236 lb 4 oz (107.2 kg)  09/27/18 235 lb 3.2 oz (106.7 kg)  09/04/18 232 lb 3.2 oz (105.3 kg)  08/26/18 238 lb 5.1 oz (108.1 kg)     Body mass index is 43.39 kg/m.   Current tobacco use? No  Labs:  Lipid Panel     Component Value Date/Time   CHOL 220 (H) 05/11/2011 0932   TRIG 576 (H) 05/11/2011 0932   HDL 40 05/11/2011 0932   CHOLHDL 5.5 05/11/2011 0932   VLDL NOT CALC 05/11/2011 0932   LDLCALC  05/11/2011 0932     Comment:       Not calculated due to Triglyceride >400. Suggest ordering Direct LDL (Unit Code: [id number]).   Total Cholesterol/HDL Ratio:CHD Risk  Coronary Heart Disease Risk Table                                        Men       Women          1/2 Average Risk              3.4        3.3              Average Risk              5.0        4.4           2X Average Risk              9.6        7.1           3X Average Risk             23.4       11.0 Use the calculated Patient Ratio above and the CHD Risk table  to determine the patient's CHD Risk. ATP III Classification (LDL):       < 100        mg/dL         Optimal      100 - 129     mg/dL         Near or Above Optimal      130 - 159     mg/dL         Borderline High      160 - 189     mg/dL         High       > 190        mg/dL         Very High     LDLDIRECT 123 (H) 01/24/2018 0937   LDLDIRECT 76 07/27/2016 1048    Lab Results  Component Value Date   HGBA1C 6.2 (H) 08/13/2018   CBG (last 3)  Recent Labs    10/23/18 0917   GLUCAP 208*    Nutrition Note Spoke with pt. Nutrition plan and goals reviewed with pt. Pt is following Step 1 of the Therapeutic Lifestyle Changes diet. Pt wants to lose wt. Pt has not been trying to lose wt. Wt loss tips reviewed (label reading, how to build a healthy plate, portion sizes, eating frequently across the day). Pt has Type 2 Diabetes. Last A1c indicates blood glucose well-controlled. This Probation officer went over Diabetes Education test results. Pt does not check CBG's. Reviewed the benefits of checking blood sugar regularly and its importance in managing blood glucose levels long term with patient. Pt in a contemplative stage, exhibits the desire to change within the next 6 months, has not made any preparations to do so currently. Will continue to use motivational interviewing and education to highlight differences in patients current behaviors vs goals. Per discussion, pt does use canned/convenience foods often. Pt does not add salt to food. Pt does eat out frequently. Pt expressed understanding of the information reviewed. Pt aware of nutrition education classes offered and would like to attend nutrition classes.  Nutrition Diagnosis ? Food-and nutrition-related knowledge deficit related to lack of exposure to information as related to diagnosis of: ? CVD ? Type 2 Diabetes ? Obese  III = 40+ related to excessive  energy intake as evidenced by a Body mass index is 43.39 kg/m.  Nutrition Intervention ? Pt's individual nutrition plan and goals reviewed with pt. ? Pt given handouts for: ? Nutrition I class ? Nutrition II class  ? Diabetes Blitz Class ? Diabetes Q & A class   Nutrition Goal(s):  ? Pt to identify and limit food sources of saturated fat, trans fat, refined carbohydrates and sodium ? Pt to identify food quantities necessary to achieve weight loss of 6-24 lb at graduation from cardiac rehab.  ? Pt to eat a variety of non-starchy vegetables. ? Pt to watch out for sweets and  added sugar  Plan:  ? Pt to attend nutrition classes:  ? Nutrition I ? Nutrition II ? Portion Distortion    ? Diabetes Blitz ? Diabetes Q & Ae determined ? Will provide client-centered nutrition education as part of interdisciplinary care ? Monitor and evaluate progress toward nutrition goal with team.   Laurina Bustle, MS, RD, LDN 10/23/2018 10:43 AM

## 2018-10-29 ENCOUNTER — Ambulatory Visit (HOSPITAL_COMMUNITY): Payer: BLUE CROSS/BLUE SHIELD

## 2018-10-29 ENCOUNTER — Encounter (HOSPITAL_COMMUNITY)
Admission: RE | Admit: 2018-10-29 | Discharge: 2018-10-29 | Disposition: A | Discharge status: Home / Self Care | Payer: BLUE CROSS/BLUE SHIELD | Source: Ambulatory Visit | Attending: Cardiology | Admitting: Cardiology

## 2018-10-29 ENCOUNTER — Other Ambulatory Visit: Payer: Self-pay | Admitting: Family Medicine

## 2018-10-29 ENCOUNTER — Telehealth (HOSPITAL_COMMUNITY): Payer: Self-pay | Admitting: Cardiac Rehabilitation

## 2018-10-29 DIAGNOSIS — Z951 Presence of aortocoronary bypass graft: Secondary | ICD-10-CM | POA: Diagnosis not present

## 2018-10-29 LAB — GLUCOSE, CAPILLARY
GLUCOSE-CAPILLARY: 234 mg/dL — AB (ref 70–99)
Glucose-Capillary: 185 mg/dL — ABNORMAL HIGH (ref 70–99)

## 2018-10-29 NOTE — Telephone Encounter (Signed)
-----   Message from Minus Breeding, MD sent at 10/28/2018  4:58 PM EST ----- Regarding: RE: cardiac rehab We can follow over time and find periodically check on RA and see if she improves.  OK to supplement with oxygen.  Thanks.  Dr. Percival Spanish  ----- Message ----- From: Lowell Guitar, RN Sent: 10/23/2018  11:05 AM EST To: Minus Breeding, MD Subject: cardiac rehab                                  Dear Dr. Percival Spanish,  Pt desaturated to 88% on RA during 6 minute walk test at cardiac rehab today. Pt took one standing rest break for 30 seconds.  Pt finished walk test with rolator.  Pt c/o moderate dyspnea with walking.    Lungs clear, no arrhythmias, BP: 148/60, HR:  84.    Is this expected response from her?  Also, is it ok if we use oxygen and tirtrate to keep O2 sats >90%?  Thank you, Andi Hence, RN, BSN Cardiac Pulmonary Rehab

## 2018-10-29 NOTE — Progress Notes (Signed)
Daily Session Note  Patient Details  Name: Pamela Thomas MRN: [id number] Date of Birth: 05/12/1956 Referring Provider:     CARDIAC REHAB PHASE II ORIENTATION from 10/23/2018 in [place]  Referring Provider  Dr. Percival Spanish      Encounter Date: 10/29/2018  Check In: Session Check In - 10/29/18 1007      Check-In   Supervising physician immediately available to respond to emergencies  [facility] immediately available    Physician(s)  Dr. Tawanna Solo    Location  [facility]    Staff Present  Barnet Pall, RN, Deland Pretty, MS, ACSM CEP, Exercise Physiologist;Tara Karle Starch, RN, BSN;Other    Medication changes reported      No    Fall or balance concerns reported     No    Tobacco Cessation  No Change    Warm-up and Cool-down  Performed as group-led instruction    Resistance Training Performed  Yes    VAD Patient?  No    PAD/SET Patient?  No      Pain Assessment   Currently in Pain?  No/denies       Capillary Blood Glucose: Results for orders placed or performed during the hospital encounter of 10/29/18 (from the past 24 hour(s))  Glucose, capillary     Status: Abnormal   Collection Time: 10/29/18  9:39 AM  Result Value Ref Range   Glucose-Capillary 234 (H) 70 - 99 mg/dL  Glucose, capillary     Status: Abnormal   Collection Time: 10/29/18 10:40 AM  Result Value Ref Range   Glucose-Capillary 185 (H) 70 - 99 mg/dL    Exercise Prescription Changes - 10/29/18 0954      Response to Exercise   Blood Pressure (Admit)  122/72    Blood Pressure (Exercise)  126/72    Blood Pressure (Exit)  122/76    Heart Rate (Admit)  69 bpm    Heart Rate (Exercise)  94 bpm    Heart Rate (Exit)  75 bpm    Rating of Perceived Exertion (Exercise)  11    Symptoms  none    Duration  Progress to 30 minutes of  aerobic without signs/symptoms of physical distress    Intensity  THRR unchanged      Progression   Progression  Continue  to progress workloads to maintain intensity without signs/symptoms of physical distress.    Average METs  1.8      Resistance Training   Training Prescription  Yes    Weight  2 lbs.     Reps  10-15    Time  10 Minutes      Interval Training   Interval Training  No      NuStep   Level  2    SPM  75    Minutes  10    METs  1.6      Arm Ergometer   Level  1    Minutes  10      Track   Laps  6    Minutes  10    METs  2.03       Social History   Tobacco Use  Smoking Status Former Smoker  . Last attempt to quit: 09/12/1990  . Years since quitting: 28.1  Smokeless Tobacco Never Used    Goals Met:  Exercise tolerated well  Goals Unmet:  Not Applicable  Comments: [name] started cardiac rehab today.  Pt tolerated light exercise without  difficulty. VSS, telemetry-Sinus Rhythm, asymptomatic.  Medication list reconciled. Pt denies barriers to medicaiton compliance.  PSYCHOSOCIAL ASSESSMENT:  PHQ-0. Pt exhibits positive coping skills, hopeful outlook with supportive family. No psychosocial needs identified at this time, no psychosocial interventions necessary.    Pt enjoys reading, participating in crafts and spending time with her grandkids..   Pt oriented to exercise equipment and routine.    Understanding verbalized. Oxygen saturations remained between 92-95% on room air. Phillipa used a rollator during exercise for gait assistance as Devera is somewhat deconditioned.Will continue to monitor the patient throughout  the program.Cherica Heiden Venetia Maxon, RN,BSN 10/30/2018 11:04 AM    Dr. Fransico Him is Medical Director for Cardiac Rehab at [facility].

## 2018-10-31 ENCOUNTER — Ambulatory Visit (HOSPITAL_COMMUNITY): Payer: BLUE CROSS/BLUE SHIELD

## 2018-10-31 ENCOUNTER — Encounter (HOSPITAL_COMMUNITY)
Admission: RE | Admit: 2018-10-31 | Discharge: 2018-10-31 | Disposition: A | Discharge status: Home / Self Care | Payer: BLUE CROSS/BLUE SHIELD | Source: Ambulatory Visit | Attending: Cardiology | Admitting: Cardiology

## 2018-10-31 DIAGNOSIS — Z951 Presence of aortocoronary bypass graft: Secondary | ICD-10-CM | POA: Diagnosis not present

## 2018-10-31 LAB — GLUCOSE, CAPILLARY
Glucose-Capillary: 176 mg/dL — ABNORMAL HIGH (ref 70–99)
Glucose-Capillary: 196 mg/dL — ABNORMAL HIGH (ref 70–99)

## 2018-10-31 NOTE — Progress Notes (Signed)
Pamela Thomas 63 y.o. female Nutrition Note Spoke with pt. Nutrition plan and goals reviewed with pt. Pt is following heart healthy diet. Pt wants to lose wt, has not been trying to lose wt since orientation by building a healthy plate. Additional heart healthy weight loss tips reviewed (label reading, how to build a healthy plate, portion sizes, eating frequently across the day,weighing and measuring portions, intuitive eating). Pt has Type 2 Diabetes, her last A1c (6.2) indicates blood glucose well-controlled. This Probation officer went over Diabetes Education test results. Pt does not check CBG's, reported this at orientation. Reviewed the benefits of checking blood sugar regularly with patient and emphasized its importance in managing blood glucose levels over time. Per discussion, pt does use canned/convenience foods often. Pt does not add salt to food. Pt does eat out frequently. Pt expressed understanding of the information reviewed. Pt aware of nutrition education classes offered and would like to attend nutrition classes.  Lab Results  Component Value Date   HGBA1C 6.2 (H) 08/13/2018    Wt Readings from Last 3 Encounters:  10/23/18 237 lb 3.4 oz (107.6 kg)  10/15/18 236 lb 4 oz (107.2 kg)  09/27/18 235 lb 3.2 oz (106.7 kg)    Nutrition Diagnosis ? Food-and nutrition-related knowledge deficit related to lack of exposure to information as related to diagnosis of: ? CVD ? Type 2 Diabetes ? Obese  III = 40+ related to excessive energy intake as evidenced by a Body mass index is 43.39 kg/m.  Nutrition Intervention ? Pt's individual nutrition plan and goals reviewed with pt. ? Pt given handouts for: ? Nutrition I class ? Nutrition II class  ? Diabetes Blitz Class ? Diabetes Q & A class   Nutrition Goal(s):  ? Pt to identify and limit food sources of saturated fat, trans fat, refined carbohydrates and sodium ? Pt to identify food quantities necessary to achieve weight loss of 6-24 lb at  graduation from cardiac rehab.  ? Pt to eat a variety of non-starchy vegetables. ? Pt to watch out for sweets and added sugar  Plan:  ? Pt to attend nutrition classes:  ? Nutrition I ? Nutrition II ? Portion Distortion    ? Diabetes Blitz ? Diabetes Q & Ae determined ? Will provide client-centered nutrition education as part of interdisciplinary care Monitor and evaluate progress toward nutrition goal with team. Laurina Bustle, MS, RD, LDN 10/31/2018 10:48 AM

## 2018-11-01 DIAGNOSIS — L570 Actinic keratosis: Secondary | ICD-10-CM | POA: Diagnosis not present

## 2018-11-01 DIAGNOSIS — L820 Inflamed seborrheic keratosis: Secondary | ICD-10-CM | POA: Diagnosis not present

## 2018-11-01 DIAGNOSIS — Z85828 Personal history of other malignant neoplasm of skin: Secondary | ICD-10-CM | POA: Diagnosis not present

## 2018-11-01 DIAGNOSIS — L910 Hypertrophic scar: Secondary | ICD-10-CM | POA: Diagnosis not present

## 2018-11-02 ENCOUNTER — Ambulatory Visit (HOSPITAL_COMMUNITY): Payer: BLUE CROSS/BLUE SHIELD

## 2018-11-02 ENCOUNTER — Encounter (HOSPITAL_COMMUNITY): Payer: BLUE CROSS/BLUE SHIELD

## 2018-11-02 ENCOUNTER — Telehealth (HOSPITAL_COMMUNITY): Payer: Self-pay | Admitting: Family Medicine

## 2018-11-05 ENCOUNTER — Ambulatory Visit (HOSPITAL_COMMUNITY): Payer: BLUE CROSS/BLUE SHIELD

## 2018-11-05 ENCOUNTER — Encounter (HOSPITAL_COMMUNITY)
Admission: RE | Admit: 2018-11-05 | Discharge: 2018-11-05 | Disposition: A | Discharge status: Home / Self Care | Payer: BLUE CROSS/BLUE SHIELD | Source: Ambulatory Visit | Attending: Cardiology | Admitting: Cardiology

## 2018-11-05 DIAGNOSIS — Z951 Presence of aortocoronary bypass graft: Secondary | ICD-10-CM

## 2018-11-05 NOTE — Progress Notes (Signed)
Reviewed home exercise guidelines with patient including endpoints, temperature precautions, target heart rate and rate of perceived exertion. Pt plans to walk as her mode of home exercise. Pt voices understanding of instructions given. Ladajah Soltys M Ghalia Reicks, MS, ACSM CEP  

## 2018-11-07 ENCOUNTER — Encounter (HOSPITAL_COMMUNITY)
Admission: RE | Admit: 2018-11-07 | Discharge: 2018-11-07 | Disposition: A | Discharge status: Home / Self Care | Payer: BLUE CROSS/BLUE SHIELD | Source: Ambulatory Visit | Attending: Cardiology | Admitting: Cardiology

## 2018-11-07 ENCOUNTER — Ambulatory Visit (HOSPITAL_COMMUNITY): Payer: BLUE CROSS/BLUE SHIELD

## 2018-11-07 DIAGNOSIS — Z951 Presence of aortocoronary bypass graft: Secondary | ICD-10-CM

## 2018-11-09 ENCOUNTER — Ambulatory Visit (HOSPITAL_COMMUNITY): Payer: BLUE CROSS/BLUE SHIELD

## 2018-11-09 ENCOUNTER — Encounter (HOSPITAL_COMMUNITY)
Admission: RE | Admit: 2018-11-09 | Discharge: 2018-11-09 | Disposition: A | Discharge status: Home / Self Care | Payer: BLUE CROSS/BLUE SHIELD | Source: Ambulatory Visit | Attending: Cardiology | Admitting: Cardiology

## 2018-11-09 DIAGNOSIS — Z951 Presence of aortocoronary bypass graft: Secondary | ICD-10-CM | POA: Diagnosis not present

## 2018-11-09 NOTE — Progress Notes (Signed)
Cardiac Individual Treatment Plan  Patient Details  Name: Pamela Thomas MRN: [id number] Date of Birth: Mar 08, 1956 Referring Provider:     CARDIAC REHAB PHASE II ORIENTATION from 10/23/2018 in [place]  Referring Provider  Dr. Percival Spanish      Initial Encounter Date:    CARDIAC REHAB PHASE II ORIENTATION from 10/23/2018 in [place]  Date  10/23/18      Visit Diagnosis: 08/17/18 S/P CABG x 2  Patient's Home Medications on Admission:  Current Outpatient Medications:  .  acetaminophen (TYLENOL) 325 MG tablet, Take 2 tablets (650 mg total) by mouth every 6 (six) hours as needed for mild pain., Disp: , Rfl:  .  amLODipine (NORVASC) 10 MG tablet, TAKE 1 TABLET BY MOUTH EVERY DAY, Disp: 30 tablet, Rfl: 10 .  aspirin 81 MG tablet, Take 1 tablet (81 mg total) by mouth daily. (Patient taking differently: Take 81 mg by mouth every morning. ), Disp: 100 tablet, Rfl: 3 .  atorvastatin (LIPITOR) 80 MG tablet, Take 80 mg by mouth every evening. , Disp: , Rfl:  .  candesartan (ATACAND) 32 MG tablet, Take 1 tablet (32 mg total) by mouth daily. (Patient taking differently: Take 32 mg by mouth every morning. ), Disp: 90 tablet, Rfl: 3 .  FLUoxetine (PROZAC) 20 MG tablet, TAKE 1 TABLET BY MOUTH EVERY DAY (Patient taking differently: Take 20 mg by mouth every morning. ), Disp: 90 tablet, Rfl: 3 .  furosemide (LASIX) 20 MG tablet, Take 1 tablet (20 mg total) by mouth 2 (two) times daily. Patient does not want to split a 40 mg  Tab so please do not substitute, Disp: 180 tablet, Rfl: 3 .  KLOR-CON M20 20 MEQ tablet, TAKE 1 TABLET BY MOUTH EVERY DAY, Disp: 90 tablet, Rfl: 3 .  metFORMIN (GLUCOPHAGE) 1000 MG tablet, TAKE 1 TABLET (1,000 MG TOTAL) BY MOUTH 2 (TWO) TIMES DAILY WITH A MEAL., Disp: 180 tablet, Rfl: 3 .  metoprolol succinate (TOPROL-XL) 50 MG 24 hr tablet, TAKE 1 TABLET BY MOUTH TWICE A DAY (Patient taking differently: Take 50 mg by  mouth 2 (two) times daily. ), Disp: 180 tablet, Rfl: 3 .  prasugrel (EFFIENT) 10 MG TABS tablet, Take 10 mg by mouth daily., Disp: , Rfl:  .  VASCEPA 1 g CAPS, Take 1 capsule by mouth as directed., Disp: , Rfl:   Past Medical History: Past Medical History:  Diagnosis Date  . ALLERGIC RHINITIS, SEASONAL   . Coronary artery disease   . Diabetes mellitus without complication ([place])   . DYSLIPIDEMIA   . Dysrhythmia    palpitations-evaluated by Dr Johnsie Cancel 6/12/[facility] 6/13  . Glucose intolerance (impaired glucose tolerance)   . History of blood transfusion   . Hypertension   . OBESITY, NOS   . Osteoarthritis    rt knee  . PLANTAR FASCIITIS, BILATERAL   . Vertigo     Tobacco Use: Social History   Tobacco Use  Smoking Status Former Smoker  . Last attempt to quit: 09/12/1990  . Years since quitting: 28.1  Smokeless Tobacco Never Used    Labs: Recent Chemical engineer    Labs for ITP Cardiac and Pulmonary Rehab Latest Ref Rng & Units 08/18/2018 08/18/2018 08/18/2018 08/20/2018 08/21/2018   Cholestrol 0 - 200 mg/dL - - - - -   LDLCALC 0 - 99 mg/dL - - - - -   LDLDIRECT 0 - 99 mg/dL - - - - -  HDL >39 mg/dL - - - - -   Trlycerides <150 mg/dL - - - - -   Hemoglobin A1c 4.8 - 5.6 % - - - - -   PHART 7.350 - 7.450 7.348(L) 7.339(L) - 7.234(L) 7.311(L)   PCO2ART 32.0 - 48.0 mmHg 44.2 46.8 - 62.1(H) 57.7(H)   HCO3 20.0 - 28.0 mmol/L 24.4 25.3 - 26.4 28.3(H)   TCO2 22 - 32 mmol/L 26 27 23 28  -   ACIDBASEDEF 0.0 - 2.0 mmol/L 1.0 1.0 - 2.0 -   O2SAT % 99.0 96.0 - 97.0 99.5      Capillary Blood Glucose: Lab Results  Component Value Date   GLUCAP 196 (H) 10/31/2018   GLUCAP 176 (H) 10/31/2018   GLUCAP 185 (H) 10/29/2018   GLUCAP 234 (H) 10/29/2018   GLUCAP 208 (H) 10/23/2018     Exercise Target Goals: Exercise Program Goal: Individual exercise prescription set using results from initial 6 min walk test and THRR while considering  patient's activity barriers and safety.    Exercise Prescription Goal: Initial exercise prescription builds to 30-45 minutes a day of aerobic activity, 2-3 days per week.  Home exercise guidelines will be given to patient during program as part of exercise prescription that the participant will acknowledge.  Activity Barriers & Risk Stratification: Activity Barriers & Cardiac Risk Stratification - 10/23/18 0825      Activity Barriers & Cardiac Risk Stratification   Activity Barriers  Muscular Weakness;Deconditioning;History of Falls;Balance Concerns;Right Knee Replacement;Joint Problems;Other (comment)    Comments  Lack of mobility in left shoulder. Left knee pain    Cardiac Risk Stratification  High       6 Minute Walk: 6 Minute Walk    Row Name 10/23/18 0958         6 Minute Walk   Phase  Initial     Distance  1112 feet     Walk Time  6 minutes     # of Rest Breaks  1 30 seconds.     MPH  2.1     METS  2.3     RPE  15     Perceived Dyspnea   3     VO2 Peak  8.11     Symptoms  Yes (comment)     Comments  SOB. Rest break for 30 seconds, started using rollator.      Resting HR  73 bpm     Resting BP  116/50     Resting Oxygen Saturation   97 %     Exercise Oxygen Saturation  during 6 min walk  88 %     Max Ex. HR  111 bpm     Max Ex. BP  148/68     2 Minute Post BP  106/59        Oxygen Initial Assessment:   Oxygen Re-Evaluation:   Oxygen Discharge (Final Oxygen Re-Evaluation):   Initial Exercise Prescription: Initial Exercise Prescription - 10/23/18 1000      Date of Initial Exercise RX and Referring Provider   Date  10/23/18    Referring Provider  Dr. Percival Spanish    Expected Discharge Date  01/28/19      NuStep   Level  2    SPM  75    Minutes  10    METs  2      Arm Ergometer   Level  1    Watts  40    Minutes  10  METs  3      Track   Laps  8    Minutes  10    METs  2.38      Prescription Details   Frequency (times per week)  3    Duration  Progress to 30 minutes of continuous  aerobic without signs/symptoms of physical distress      Intensity   THRR 40-80% of Max Heartrate  63-126    Ratings of Perceived Exertion  11-13    Perceived Dyspnea  0-4      Progression   Progression  Continue to progress workloads to maintain intensity without signs/symptoms of physical distress.      Resistance Training   Training Prescription  Yes    Weight  2 lbs.     Reps  10-15       Perform Capillary Blood Glucose checks as needed.  Exercise Prescription Changes:  Exercise Prescription Changes    Row Name 10/29/18 0954 11/05/18 0953           Response to Exercise   Blood Pressure (Admit)  122/72  122/70      Blood Pressure (Exercise)  126/72  136/60      Blood Pressure (Exit)  122/76  138/82      Heart Rate (Admit)  69 bpm  86 bpm      Heart Rate (Exercise)  94 bpm  104 bpm      Heart Rate (Exit)  75 bpm  77 bpm      Rating of Perceived Exertion (Exercise)  11  12      Symptoms  none  none      Duration  Progress to 30 minutes of  aerobic without signs/symptoms of physical distress  Progress to 30 minutes of  aerobic without signs/symptoms of physical distress      Intensity  THRR unchanged  THRR unchanged        Progression   Progression  Continue to progress workloads to maintain intensity without signs/symptoms of physical distress.  Continue to progress workloads to maintain intensity without signs/symptoms of physical distress.      Average METs  1.8  2        Resistance Training   Training Prescription  Yes  Yes      Weight  2 lbs.   2 lbs.       Reps  10-15  10-15      Time  10 Minutes  10 Minutes        Interval Training   Interval Training  No  No        NuStep   Level  2  3      SPM  75  75      Minutes  10  10      METs  1.6  1.7        Arm Ergometer   Level  1  1      Minutes  10  10        Track   Laps  6  8      Minutes  10  10      METs  2.03  2.39        Home Exercise Plan   Plans to continue exercise at  -  Home (comment)  Plans to walk at home.      Frequency  -  Add 1 additional day to program exercise sessions.      Initial  Home Exercises Provided  -  11/05/18         Exercise Comments:  Exercise Comments    Row Name 10/29/18 1050 11/05/18 1007         Exercise Comments  Patient tolerated low intensity exercise well, taking rest breaks as needed. SaO2 92%-93% room air with exercise.  Reviewed home exercise guidelines, METs, and goals with patient.         Exercise Goals and Review:  Exercise Goals    Row Name 10/23/18 0825             Exercise Goals   Increase Physical Activity  Yes       Intervention  Provide advice, education, support and counseling about physical activity/exercise needs.;Develop an individualized exercise prescription for aerobic and resistive training based on initial evaluation findings, risk stratification, comorbidities and participant's personal goals.       Expected Outcomes  Short Term: Attend rehab on a regular basis to increase amount of physical activity.       Increase Strength and Stamina  Yes       Intervention  Provide advice, education, support and counseling about physical activity/exercise needs.;Develop an individualized exercise prescription for aerobic and resistive training based on initial evaluation findings, risk stratification, comorbidities and participant's personal goals.       Expected Outcomes  Short Term: Increase workloads from initial exercise prescription for resistance, speed, and METs.       Able to understand and use rate of perceived exertion (RPE) scale  Yes       Intervention  Provide education and explanation on how to use RPE scale       Expected Outcomes  Short Term: Able to use RPE daily in rehab to express subjective intensity level;Long Term:  Able to use RPE to guide intensity level when exercising independently       Knowledge and understanding of Target Heart Rate Range (THRR)  Yes       Intervention  Provide education and  explanation of THRR including how the numbers were predicted and where they are located for reference       Expected Outcomes  Short Term: Able to state/look up THRR;Long Term: Able to use THRR to govern intensity when exercising independently;Short Term: Able to use daily as guideline for intensity in rehab       Able to check pulse independently  Yes       Intervention  Provide education and demonstration on how to check pulse in carotid and radial arteries.;Review the importance of being able to check your own pulse for safety during independent exercise       Expected Outcomes  Short Term: Able to explain why pulse checking is important during independent exercise;Long Term: Able to check pulse independently and accurately       Understanding of Exercise Prescription  Yes       Intervention  Provide education, explanation, and written materials on patient's individual exercise prescription       Expected Outcomes  Short Term: Able to explain program exercise prescription;Long Term: Able to explain home exercise prescription to exercise independently          Exercise Goals Re-Evaluation : Exercise Goals Re-Evaluation    Row Name 10/29/18 1343 11/05/18 1007           Exercise Goal Re-Evaluation   Exercise Goals Review  Increase Physical Activity;Able to understand and use rate of perceived exertion (RPE) scale  Increase Physical Activity;Able  to understand and use rate of perceived exertion (RPE) scale;Knowledge and understanding of Target Heart Rate Range (THRR);Able to check pulse independently;Understanding of Exercise Prescription      Comments  Patient able to understand and use RPE scale appropriately.  Reviewed home exercise guidelines with patient including THRR, RPE scale, and endpoints for exercise. Pt is able to check her pulse using her Apple watch. Pt plans to walk, weather permitting as her mode of home exercise.      Expected Outcomes  Increase workloads as tolerated to help  improve cardiorespiratory fitness.  Patient will walk 30 minutes, 1-2 days/week in addition to exercise at cardiac rehab to help improve strength and endurance.         Discharge Exercise Prescription (Final Exercise Prescription Changes): Exercise Prescription Changes - 11/05/18 0953      Response to Exercise   Blood Pressure (Admit)  122/70    Blood Pressure (Exercise)  136/60    Blood Pressure (Exit)  138/82    Heart Rate (Admit)  86 bpm    Heart Rate (Exercise)  104 bpm    Heart Rate (Exit)  77 bpm    Rating of Perceived Exertion (Exercise)  12    Symptoms  none    Duration  Progress to 30 minutes of  aerobic without signs/symptoms of physical distress    Intensity  THRR unchanged      Progression   Progression  Continue to progress workloads to maintain intensity without signs/symptoms of physical distress.    Average METs  2      Resistance Training   Training Prescription  Yes    Weight  2 lbs.     Reps  10-15    Time  10 Minutes      Interval Training   Interval Training  No      NuStep   Level  3    SPM  75    Minutes  10    METs  1.7      Arm Ergometer   Level  1    Minutes  10      Track   Laps  8    Minutes  10    METs  2.39      Home Exercise Plan   Plans to continue exercise at  Home (comment)   Plans to walk at home.   Frequency  Add 1 additional day to program exercise sessions.    Initial Home Exercises Provided  11/05/18       Nutrition:  Target Goals: Understanding of nutrition guidelines, daily intake of sodium 1500mg , cholesterol 200mg , calories 30% from fat and 7% or less from saturated fats, daily to have 5 or more servings of fruits and vegetables.  Biometrics: Pre Biometrics - 10/23/18 1002      Pre Biometrics   Height  5\' 2"  (1.575 m)    Weight  237 lb 3.4 oz (107.6 kg)    Waist Circumference  45 inches    Hip Circumference  52 inches    Waist to Hip Ratio  0.87 %    BMI (Calculated)  43.38    Triceps Skinfold  48 mm     % Body Fat  54.2 %    Grip Strength  28 kg    Flexibility  10.5 in    Single Leg Stand  1.43 seconds        Nutrition Therapy Plan and Nutrition Goals: Nutrition Therapy & Goals - 10/23/18 1055  Nutrition Therapy   Diet  heart healthy, carb modified      Personal Nutrition Goals   Nutrition Goal  Pt to identify and limit food sources of saturated fat, trans fat, refined carbohydrates and sodium    Personal Goal #2  Pt to identify food quantities necessary to achieve weight loss of 6-24 lb at graduation from cardiac rehab.    Personal Goal #3  Pt to eat a variety of non-starchy vegetables.    Personal Goal #4  Pt to watch out for sweets and added sugar      Intervention Plan   Intervention  Prescribe, educate and counsel regarding individualized specific dietary modifications aiming towards targeted core components such as weight, hypertension, lipid management, diabetes, heart failure and other comorbidities.    Expected Outcomes  Short Term Goal: Understand basic principles of dietary content, such as calories, fat, sodium, cholesterol and nutrients.;Long Term Goal: Adherence to prescribed nutrition plan.       Nutrition Assessments: Nutrition Assessments - 10/23/18 1057      MEDFICTS Scores   Pre Score  60       Nutrition Goals Re-Evaluation: Nutrition Goals Re-Evaluation    Row Name 10/23/18 1055             Goals   Current Weight  237 lb 3.4 oz (107.6 kg)          Nutrition Goals Re-Evaluation: Nutrition Goals Re-Evaluation    [place] Name 10/23/18 1055             Goals   Current Weight  237 lb 3.4 oz (107.6 kg)          Nutrition Goals Discharge (Final Nutrition Goals Re-Evaluation): Nutrition Goals Re-Evaluation - 10/23/18 1055      Goals   Current Weight  237 lb 3.4 oz (107.6 kg)       Psychosocial: Target Goals: Acknowledge presence or absence of significant depression and/or stress, maximize coping skills, provide positive support system.  Participant is able to verbalize types and ability to use techniques and skills needed for reducing stress and depression.  Initial Review & Psychosocial Screening: Initial Psych Review & Screening - 10/23/18 1049      Initial Review   Current issues with  None Identified      Family Dynamics   Good Support System?  Yes   Mallorie has her spouse, siblings, children and friends for support     Barriers   Psychosocial barriers to participate in program  There are no identifiable barriers or psychosocial needs.      Screening Interventions   Interventions  Encouraged to exercise       Quality of Life Scores: Quality of Life - 10/23/18 1004      Quality of Life   Select  Quality of Life      Quality of Life Scores   Health/Function Pre  28 %    Socioeconomic Pre  28 %    Psych/Spiritual Pre  30 %    Family Pre  30 %    GLOBAL Pre  28.71 %      Scores of 19 and below usually indicate a poorer quality of life in these areas.  A difference of  2-3 points is a clinically meaningful difference.  A difference of 2-3 points in the total score of the Quality of Life Index has been associated with significant improvement in overall quality of life, self-image, physical symptoms, and general health in studies assessing change  in quality of life.  PHQ-9: Recent Review Flowsheet Data    Depression screen [facility] 2/9 02/28/2018 01/24/2018 01/12/2018 07/27/2016 08/12/2015   Decreased Interest 0 0 0 0 0   Down, Depressed, Hopeless 0 0 0 0 0   PHQ - 2 Score 0 0 0 0 0     Interpretation of Total Score  Total Score Depression Severity:  1-4 = Minimal depression, 5-9 = Mild depression, 10-14 = Moderate depression, 15-19 = Moderately severe depression, 20-27 = Severe depression   Psychosocial Evaluation and Intervention:   Psychosocial Re-Evaluation: Psychosocial Re-Evaluation    [place] Name 11/09/18 1654             Psychosocial Re-Evaluation   Current issues with  None Identified        Interventions  Encouraged to attend Cardiac Rehabilitation for the exercise       Continue Psychosocial Services   No Follow up required          Psychosocial Discharge (Final Psychosocial Re-Evaluation): Psychosocial Re-Evaluation - 11/09/18 1654      Psychosocial Re-Evaluation   Current issues with  None Identified    Interventions  Encouraged to attend Cardiac Rehabilitation for the exercise    Continue Psychosocial Services   No Follow up required       Vocational Rehabilitation: Provide vocational rehab assistance to qualifying candidates.   Vocational Rehab Evaluation & Intervention: Vocational Rehab - 10/23/18 1051      Initial Vocational Rehab Evaluation & Intervention   Assessment shows need for Vocational Rehabilitation  No   Geralynn is retired and does not need vocational rehab at this time.      Education: Education Goals: Education classes will be provided on a weekly basis, covering required topics. Participant will state understanding/return demonstration of topics presented.  Learning Barriers/Preferences: Learning Barriers/Preferences - 10/23/18 0859      Learning Barriers/Preferences   Learning Barriers  None    Learning Preferences  Individual Instruction;Group Instruction       Education Topics: Count Your Pulse:  -Group instruction provided by verbal instruction, demonstration, patient participation and written materials to support subject.  Instructors address importance of being able to find your pulse and how to count your pulse when at home without a heart monitor.  Patients get hands on experience counting their pulse with staff help and individually.   CARDIAC REHAB PHASE II EXERCISE from 11/09/2018 in [place]  Date  11/09/18  Instruction Review Code  2- Demonstrated Understanding      Heart Attack, Angina, and Risk Factor Modification:  -Group instruction provided by verbal instruction, video, and written  materials to support subject.  Instructors address signs and symptoms of angina and heart attacks.    Also discuss risk factors for heart disease and how to make changes to improve heart health risk factors.   Functional Fitness:  -Group instruction provided by verbal instruction, demonstration, patient participation, and written materials to support subject.  Instructors address safety measures for doing things around the house.  Discuss how to get up and down off the floor, how to pick things up properly, how to safely get out of a chair without assistance, and balance training.   Meditation and Mindfulness:  -Group instruction provided by verbal instruction, patient participation, and written materials to support subject.  Instructor addresses importance of mindfulness and meditation practice to help reduce stress and improve awareness.  Instructor also leads participants through a meditation exercise.  Stretching for Flexibility and Mobility:  -Group instruction provided by verbal instruction, patient participation, and written materials to support subject.  Instructors lead participants through series of stretches that are designed to increase flexibility thus improving mobility.  These stretches are additional exercise for major muscle groups that are typically performed during regular warm up and cool down.   Hands Only CPR:  -Group verbal, video, and participation provides a basic overview of AHA guidelines for community CPR. Role-play of emergencies allow participants the opportunity to practice calling for help and chest compression technique with discussion of AED use.   Hypertension: -Group verbal and written instruction that provides a basic overview of hypertension including the most recent diagnostic guidelines, risk factor reduction with self-care instructions and medication management.    Nutrition I class: Heart Healthy Eating:  -Group instruction provided by PowerPoint  slides, verbal discussion, and written materials to support subject matter. The instructor gives an explanation and review of the Therapeutic Lifestyle Changes diet recommendations, which includes a discussion on lipid goals, dietary fat, sodium, fiber, plant stanol/sterol esters, sugar, and the components of a well-balanced, healthy diet.   Nutrition II class: Lifestyle Skills:  -Group instruction provided by PowerPoint slides, verbal discussion, and written materials to support subject matter. The instructor gives an explanation and review of label reading, grocery shopping for heart health, heart healthy recipe modifications, and ways to make healthier choices when eating out.   Diabetes Question & Answer:  -Group instruction provided by PowerPoint slides, verbal discussion, and written materials to support subject matter. The instructor gives an explanation and review of diabetes co-morbidities, pre- and post-prandial blood glucose goals, pre-exercise blood glucose goals, signs, symptoms, and treatment of hypoglycemia and hyperglycemia, and foot care basics.   Diabetes Blitz:  -Group instruction provided by PowerPoint slides, verbal discussion, and written materials to support subject matter. The instructor gives an explanation and review of the physiology behind type 1 and type 2 diabetes, diabetes medications and rational behind using different medications, pre- and post-prandial blood glucose recommendations and Hemoglobin A1c goals, diabetes diet, and exercise including blood glucose guidelines for exercising safely.    Portion Distortion:  -Group instruction provided by PowerPoint slides, verbal discussion, written materials, and food models to support subject matter. The instructor gives an explanation of serving size versus portion size, changes in portions sizes over the last 20 years, and what consists of a serving from each food group.   Stress Management:  -Group instruction  provided by verbal instruction, video, and written materials to support subject matter.  Instructors review role of stress in heart disease and how to cope with stress positively.     Exercising on Your Own:  -Group instruction provided by verbal instruction, power point, and written materials to support subject.  Instructors discuss benefits of exercise, components of exercise, frequency and intensity of exercise, and end points for exercise.  Also discuss use of nitroglycerin and activating EMS.  Review options of places to exercise outside of rehab.  Review guidelines for sex with heart disease.   Cardiac Drugs I:  -Group instruction provided by verbal instruction and written materials to support subject.  Instructor reviews cardiac drug classes: antiplatelets, anticoagulants, beta blockers, and statins.  Instructor discusses reasons, side effects, and lifestyle considerations for each drug class.   Cardiac Drugs II:  -Group instruction provided by verbal instruction and written materials to support subject.  Instructor reviews cardiac drug classes: angiotensin converting enzyme inhibitors (ACE-I), angiotensin II receptor blockers (ARBs), nitrates,  and calcium channel blockers.  Instructor discusses reasons, side effects, and lifestyle considerations for each drug class.   Anatomy and Physiology of the Circulatory System:  Group verbal and written instruction and models provide basic cardiac anatomy and physiology, with the coronary electrical and arterial systems. Review of: AMI, Angina, Valve disease, Heart Failure, Peripheral Artery Disease, Cardiac Arrhythmia, Pacemakers, and the ICD.   Other Education:  -Group or individual verbal, written, or video instructions that support the educational goals of the cardiac rehab program.   Holiday Eating Survival Tips:  -Group instruction provided by PowerPoint slides, verbal discussion, and written materials to support subject matter. The  instructor gives patients tips, tricks, and techniques to help them not only survive but enjoy the holidays despite the onslaught of food that accompanies the holidays.   Knowledge Questionnaire Score: Knowledge Questionnaire Score - 10/23/18 0857      Knowledge Questionnaire Score   Pre Score  20/24       Core Components/Risk Factors/Patient Goals at Admission: Personal Goals and Risk Factors at Admission - 10/23/18 0858      Core Components/Risk Factors/Patient Goals on Admission    Weight Management  Yes;Obesity;Weight Maintenance;Weight Loss    Intervention  Weight Management: Develop a combined nutrition and exercise program designed to reach desired caloric intake, while maintaining appropriate intake of nutrient and fiber, sodium and fats, and appropriate energy expenditure required for the weight goal.;Weight Management: Provide education and appropriate resources to help participant work on and attain dietary goals.;Weight Management/Obesity: Establish reasonable short term and long term weight goals.;Obesity: Provide education and appropriate resources to help participant work on and attain dietary goals.    Admit Weight  237 lb 3.4 oz (107.6 kg)    Goal Weight: Short Term  217 lb (98.4 kg)    Goal Weight: Long Term  187 lb (84.8 kg)    Expected Outcomes  Short Term: Continue to assess and modify interventions until short term weight is achieved;Long Term: Adherence to nutrition and physical activity/exercise program aimed toward attainment of established weight goal;Weight Maintenance: Understanding of the daily nutrition guidelines, which includes 25-35% calories from fat, 7% or less cal from saturated fats, less than 200mg  cholesterol, less than 1.5gm of sodium, & 5 or more servings of fruits and vegetables daily;Weight Loss: Understanding of general recommendations for a balanced deficit meal plan, which promotes 1-2 lb weight loss per week and includes a negative energy balance of  [phone] kcal/d;Understanding recommendations for meals to include 15-35% energy as protein, 25-35% energy from fat, 35-60% energy from carbohydrates, less than 200mg  of dietary cholesterol, 20-35 gm of total fiber daily;Understanding of distribution of calorie intake throughout the day with the consumption of 4-5 meals/snacks    Improve shortness of breath with ADL's  Yes    Intervention  Provide education, individualized exercise plan and daily activity instruction to help decrease symptoms of SOB with activities of daily living.    Expected Outcomes  Short Term: Improve cardiorespiratory fitness to achieve a reduction of symptoms when performing ADLs;Long Term: Be able to perform more ADLs without symptoms or delay the onset of symptoms    Diabetes  Yes    Intervention  Provide education about signs/symptoms and action to take for hypo/hyperglycemia.;Provide education about proper nutrition, including hydration, and aerobic/resistive exercise prescription along with prescribed medications to achieve blood glucose in normal ranges: Fasting glucose 65-99 mg/dL    Expected Outcomes  Short Term: Participant verbalizes understanding of the signs/symptoms and immediate care of  hyper/hypoglycemia, proper foot care and importance of medication, aerobic/resistive exercise and nutrition plan for blood glucose control.;Long Term: Attainment of HbA1C < 7%.    Hypertension  Yes    Intervention  Provide education on lifestyle modifcations including regular physical activity/exercise, weight management, moderate sodium restriction and increased consumption of fresh fruit, vegetables, and low fat dairy, alcohol moderation, and smoking cessation.;Monitor prescription use compliance.    Expected Outcomes  Short Term: Continued assessment and intervention until BP is < 140/85mm HG in hypertensive participants. < 130/49mm HG in hypertensive participants with diabetes, heart failure or chronic kidney disease.;Long Term:  Maintenance of blood pressure at goal levels.    Lipids  Yes    Intervention  Provide education and support for participant on nutrition & aerobic/resistive exercise along with prescribed medications to achieve LDL 70mg , HDL >40mg .    Expected Outcomes  Short Term: Participant states understanding of desired cholesterol values and is compliant with medications prescribed. Participant is following exercise prescription and nutrition guidelines.;Long Term: Cholesterol controlled with medications as prescribed, with individualized exercise RX and with personalized nutrition plan. Value goals: LDL < 70mg , HDL > 40 mg.    Stress  Yes    Intervention  Offer individual and/or small group education and counseling on adjustment to heart disease, stress management and health-related lifestyle change. Teach and support self-help strategies.;Refer participants experiencing significant psychosocial distress to appropriate mental health specialists for further evaluation and treatment. When possible, include family members and significant others in education/counseling sessions.    Expected Outcomes  Short Term: Participant demonstrates changes in health-related behavior, relaxation and other stress management skills, ability to obtain effective social support, and compliance with psychotropic medications if prescribed.;Long Term: Emotional wellbeing is indicated by absence of clinically significant psychosocial distress or social isolation.       Core Components/Risk Factors/Patient Goals Review:  Goals and Risk Factor Review    Row Name 11/09/18 1655             Core Components/Risk Factors/Patient Goals Review   Personal Goals Review  Weight Management/Obesity;Lipids;Diabetes;Improve shortness of breath with ADL's       Review  Nereida's oxygen saturations, vital signs and CBG's have been stable. Poet is off to a slow start to exercise       Expected Outcomes  Nikeria will continue to participate in  phase 2 cardiac rehab for exercise, nutrition and lifestyle modifications          Core Components/Risk Factors/Patient Goals at Discharge (Final Review):  Goals and Risk Factor Review - 11/09/18 1655      Core Components/Risk Factors/Patient Goals Review   Personal Goals Review  Weight Management/Obesity;Lipids;Diabetes;Improve shortness of breath with ADL's    Review  Vickie's oxygen saturations, vital signs and CBG's have been stable. Clarice is off to a slow start to exercise    Expected Outcomes  Shrita will continue to participate in phase 2 cardiac rehab for exercise, nutrition and lifestyle modifications       ITP Comments: ITP Comments    Row Name 10/23/18 5176 11/09/18 1649         ITP Comments  Dr. Fransico Him, Medical Director  30 Day ITP Review.  Daneen is off to a good start to exercise.         Comments: See ITP comments.Barnet Pall, RN,BSN 11/15/2018 12:29 PM

## 2018-11-12 ENCOUNTER — Ambulatory Visit (HOSPITAL_COMMUNITY): Payer: BLUE CROSS/BLUE SHIELD

## 2018-11-12 ENCOUNTER — Encounter (HOSPITAL_COMMUNITY)
Admission: RE | Admit: 2018-11-12 | Discharge: 2018-11-12 | Disposition: A | Discharge status: Home / Self Care | Payer: BLUE CROSS/BLUE SHIELD | Source: Ambulatory Visit | Attending: Cardiology | Admitting: Cardiology

## 2018-11-12 DIAGNOSIS — Z951 Presence of aortocoronary bypass graft: Secondary | ICD-10-CM | POA: Diagnosis not present

## 2018-11-14 ENCOUNTER — Ambulatory Visit (HOSPITAL_COMMUNITY): Payer: BLUE CROSS/BLUE SHIELD

## 2018-11-14 ENCOUNTER — Encounter (HOSPITAL_COMMUNITY)
Admission: RE | Admit: 2018-11-14 | Discharge: 2018-11-14 | Disposition: A | Discharge status: Home / Self Care | Payer: BLUE CROSS/BLUE SHIELD | Source: Ambulatory Visit | Attending: Cardiology | Admitting: Cardiology

## 2018-11-14 DIAGNOSIS — Z951 Presence of aortocoronary bypass graft: Secondary | ICD-10-CM

## 2018-11-16 ENCOUNTER — Ambulatory Visit (HOSPITAL_COMMUNITY): Payer: BLUE CROSS/BLUE SHIELD

## 2018-11-16 ENCOUNTER — Encounter (HOSPITAL_COMMUNITY): Payer: BLUE CROSS/BLUE SHIELD

## 2018-11-19 ENCOUNTER — Ambulatory Visit (HOSPITAL_COMMUNITY): Payer: BLUE CROSS/BLUE SHIELD

## 2018-11-19 ENCOUNTER — Encounter (HOSPITAL_COMMUNITY)
Admission: RE | Admit: 2018-11-19 | Discharge: 2018-11-19 | Disposition: A | Discharge status: Home / Self Care | Payer: BLUE CROSS/BLUE SHIELD | Source: Ambulatory Visit | Attending: Cardiology | Admitting: Cardiology

## 2018-11-19 DIAGNOSIS — Z951 Presence of aortocoronary bypass graft: Secondary | ICD-10-CM

## 2018-11-21 ENCOUNTER — Ambulatory Visit (HOSPITAL_COMMUNITY): Payer: BLUE CROSS/BLUE SHIELD

## 2018-11-21 ENCOUNTER — Other Ambulatory Visit: Payer: Self-pay

## 2018-11-21 ENCOUNTER — Encounter (HOSPITAL_COMMUNITY)
Admission: RE | Admit: 2018-11-21 | Discharge: 2018-11-21 | Disposition: A | Discharge status: Home / Self Care | Payer: BLUE CROSS/BLUE SHIELD | Source: Ambulatory Visit | Attending: Cardiology | Admitting: Cardiology

## 2018-11-21 DIAGNOSIS — Z951 Presence of aortocoronary bypass graft: Secondary | ICD-10-CM | POA: Diagnosis not present

## 2018-11-23 ENCOUNTER — Ambulatory Visit (HOSPITAL_COMMUNITY): Payer: BLUE CROSS/BLUE SHIELD

## 2018-11-23 ENCOUNTER — Encounter (HOSPITAL_COMMUNITY)
Admission: RE | Admit: 2018-11-23 | Discharge: 2018-11-23 | Disposition: A | Discharge status: Home / Self Care | Payer: BLUE CROSS/BLUE SHIELD | Source: Ambulatory Visit | Attending: Cardiology | Admitting: Cardiology

## 2018-11-23 ENCOUNTER — Other Ambulatory Visit: Payer: Self-pay

## 2018-11-23 DIAGNOSIS — Z951 Presence of aortocoronary bypass graft: Secondary | ICD-10-CM | POA: Diagnosis not present

## 2018-11-26 ENCOUNTER — Encounter (HOSPITAL_COMMUNITY): Payer: BLUE CROSS/BLUE SHIELD

## 2018-11-26 ENCOUNTER — Ambulatory Visit (HOSPITAL_COMMUNITY): Payer: BLUE CROSS/BLUE SHIELD

## 2018-11-26 ENCOUNTER — Telehealth (HOSPITAL_COMMUNITY): Payer: Self-pay | Admitting: *Deleted

## 2018-11-28 ENCOUNTER — Ambulatory Visit: Payer: BLUE CROSS/BLUE SHIELD | Admitting: Internal Medicine

## 2018-11-28 ENCOUNTER — Ambulatory Visit (HOSPITAL_COMMUNITY): Payer: BLUE CROSS/BLUE SHIELD

## 2018-11-29 ENCOUNTER — Encounter (HOSPITAL_COMMUNITY): Payer: Self-pay | Admitting: *Deleted

## 2018-11-29 DIAGNOSIS — Z951 Presence of aortocoronary bypass graft: Secondary | ICD-10-CM

## 2018-11-30 ENCOUNTER — Ambulatory Visit (HOSPITAL_COMMUNITY): Payer: BLUE CROSS/BLUE SHIELD

## 2018-11-30 ENCOUNTER — Encounter (HOSPITAL_COMMUNITY): Payer: BLUE CROSS/BLUE SHIELD

## 2018-12-03 ENCOUNTER — Encounter (HOSPITAL_COMMUNITY): Payer: Self-pay | Admitting: *Deleted

## 2018-12-03 ENCOUNTER — Ambulatory Visit (HOSPITAL_COMMUNITY): Payer: BLUE CROSS/BLUE SHIELD

## 2018-12-03 ENCOUNTER — Telehealth (HOSPITAL_COMMUNITY): Payer: Self-pay | Admitting: *Deleted

## 2018-12-03 ENCOUNTER — Encounter (HOSPITAL_COMMUNITY): Payer: BLUE CROSS/BLUE SHIELD

## 2018-12-03 DIAGNOSIS — Z951 Presence of aortocoronary bypass graft: Secondary | ICD-10-CM

## 2018-12-03 NOTE — Progress Notes (Signed)
Cardiac Individual Treatment Plan  Patient Details  Name: Pamela Thomas MRN: [id number] Date of Birth: 1955-09-30 Referring Provider:     CARDIAC REHAB PHASE II ORIENTATION from 10/23/2018 in [place]  Referring Provider  Dr. Percival Spanish      Initial Encounter Date:    CARDIAC REHAB PHASE II ORIENTATION from 10/23/2018 in [place]  Date  10/23/18      Visit Diagnosis: 08/17/18 S/P CABG x 2  Patient's Home Medications on Admission:  Current Outpatient Medications:  .  acetaminophen (TYLENOL) 325 MG tablet, Take 2 tablets (650 mg total) by mouth every 6 (six) hours as needed for mild pain., Disp: , Rfl:  .  amLODipine (NORVASC) 10 MG tablet, TAKE 1 TABLET BY MOUTH EVERY DAY, Disp: 30 tablet, Rfl: 10 .  aspirin 81 MG tablet, Take 1 tablet (81 mg total) by mouth daily. (Patient taking differently: Take 81 mg by mouth every morning. ), Disp: 100 tablet, Rfl: 3 .  atorvastatin (LIPITOR) 80 MG tablet, Take 80 mg by mouth every evening. , Disp: , Rfl:  .  candesartan (ATACAND) 32 MG tablet, Take 1 tablet (32 mg total) by mouth daily. (Patient taking differently: Take 32 mg by mouth every morning. ), Disp: 90 tablet, Rfl: 3 .  FLUoxetine (PROZAC) 20 MG tablet, TAKE 1 TABLET BY MOUTH EVERY DAY (Patient taking differently: Take 20 mg by mouth every morning. ), Disp: 90 tablet, Rfl: 3 .  furosemide (LASIX) 20 MG tablet, Take 1 tablet (20 mg total) by mouth 2 (two) times daily. Patient does not want to split a 40 mg  Tab so please do not substitute, Disp: 180 tablet, Rfl: 3 .  KLOR-CON M20 20 MEQ tablet, TAKE 1 TABLET BY MOUTH EVERY DAY, Disp: 90 tablet, Rfl: 3 .  metFORMIN (GLUCOPHAGE) 1000 MG tablet, TAKE 1 TABLET (1,000 MG TOTAL) BY MOUTH 2 (TWO) TIMES DAILY WITH A MEAL., Disp: 180 tablet, Rfl: 3 .  metoprolol succinate (TOPROL-XL) 50 MG 24 hr tablet, TAKE 1 TABLET BY MOUTH TWICE A DAY (Patient taking differently: Take 50 mg by  mouth 2 (two) times daily. ), Disp: 180 tablet, Rfl: 3 .  prasugrel (EFFIENT) 10 MG TABS tablet, Take 10 mg by mouth daily., Disp: , Rfl:  .  VASCEPA 1 g CAPS, Take 1 capsule by mouth as directed., Disp: , Rfl:   Past Medical History: Past Medical History:  Diagnosis Date  . ALLERGIC RHINITIS, SEASONAL   . Coronary artery disease   . Diabetes mellitus without complication ([place])   . DYSLIPIDEMIA   . Dysrhythmia    palpitations-evaluated by Dr Johnsie Cancel 6/12/[facility] 6/13  . Glucose intolerance (impaired glucose tolerance)   . History of blood transfusion   . Hypertension   . OBESITY, NOS   . Osteoarthritis    rt knee  . PLANTAR FASCIITIS, BILATERAL   . Vertigo     Tobacco Use: Social History   Tobacco Use  Smoking Status Former Smoker  . Last attempt to quit: 09/12/1990  . Years since quitting: 28.2  Smokeless Tobacco Never Used    Labs: Recent Chemical engineer    Labs for ITP Cardiac and Pulmonary Rehab Latest Ref Rng & Units 08/18/2018 08/18/2018 08/18/2018 08/20/2018 08/21/2018   Cholestrol 0 - 200 mg/dL - - - - -   LDLCALC 0 - 99 mg/dL - - - - -   LDLDIRECT 0 - 99 mg/dL - - - - -  HDL >39 mg/dL - - - - -   Trlycerides <150 mg/dL - - - - -   Hemoglobin A1c 4.8 - 5.6 % - - - - -   PHART 7.350 - 7.450 7.348(L) 7.339(L) - 7.234(L) 7.311(L)   PCO2ART 32.0 - 48.0 mmHg 44.2 46.8 - 62.1(H) 57.7(H)   HCO3 20.0 - 28.0 mmol/L 24.4 25.3 - 26.4 28.3(H)   TCO2 22 - 32 mmol/L 26 27 23 28  -   ACIDBASEDEF 0.0 - 2.0 mmol/L 1.0 1.0 - 2.0 -   O2SAT % 99.0 96.0 - 97.0 99.5      Capillary Blood Glucose: Lab Results  Component Value Date   GLUCAP 196 (H) 10/31/2018   GLUCAP 176 (H) 10/31/2018   GLUCAP 185 (H) 10/29/2018   GLUCAP 234 (H) 10/29/2018   GLUCAP 208 (H) 10/23/2018     Exercise Target Goals: Exercise Program Goal: Individual exercise prescription set using results from initial 6 min walk test and THRR while considering  patient's activity barriers and safety.    Exercise Prescription Goal: Initial exercise prescription builds to 30-45 minutes a day of aerobic activity, 2-3 days per week.  Home exercise guidelines will be given to patient during program as part of exercise prescription that the participant will acknowledge.  Activity Barriers & Risk Stratification: Activity Barriers & Cardiac Risk Stratification - 10/23/18 0825      Activity Barriers & Cardiac Risk Stratification   Activity Barriers  Muscular Weakness;Deconditioning;History of Falls;Balance Concerns;Right Knee Replacement;Joint Problems;Other (comment)    Comments  Lack of mobility in left shoulder. Left knee pain    Cardiac Risk Stratification  High       6 Minute Walk: 6 Minute Walk    Row Name 10/23/18 0958         6 Minute Walk   Phase  Initial     Distance  1112 feet     Walk Time  6 minutes     # of Rest Breaks  1 30 seconds.     MPH  2.1     METS  2.3     RPE  15     Perceived Dyspnea   3     VO2 Peak  8.11     Symptoms  Yes (comment)     Comments  SOB. Rest break for 30 seconds, started using rollator.      Resting HR  73 bpm     Resting BP  116/50     Resting Oxygen Saturation   97 %     Exercise Oxygen Saturation  during 6 min walk  88 %     Max Ex. HR  111 bpm     Max Ex. BP  148/68     2 Minute Post BP  106/59        Oxygen Initial Assessment:   Oxygen Re-Evaluation:   Oxygen Discharge (Final Oxygen Re-Evaluation):   Initial Exercise Prescription: Initial Exercise Prescription - 10/23/18 1000      Date of Initial Exercise RX and Referring Provider   Date  10/23/18    Referring Provider  Dr. Percival Spanish    Expected Discharge Date  01/28/19      NuStep   Level  2    SPM  75    Minutes  10    METs  2      Arm Ergometer   Level  1    Watts  40    Minutes  10  METs  3      Track   Laps  8    Minutes  10    METs  2.38      Prescription Details   Frequency (times per week)  3    Duration  Progress to 30 minutes of continuous  aerobic without signs/symptoms of physical distress      Intensity   THRR 40-80% of Max Heartrate  63-126    Ratings of Perceived Exertion  11-13    Perceived Dyspnea  0-4      Progression   Progression  Continue to progress workloads to maintain intensity without signs/symptoms of physical distress.      Resistance Training   Training Prescription  Yes    Weight  2 lbs.     Reps  10-15       Perform Capillary Blood Glucose checks as needed.  Exercise Prescription Changes: Exercise Prescription Changes    Row Name 10/29/18 [phone] 11/05/18 0953 11/19/18 0956 11/23/18 0955       Response to Exercise   Blood Pressure (Admit)  122/72  122/70  110/58  120/70    Blood Pressure (Exercise)  126/72  136/60  110/60  138/80    Blood Pressure (Exit)  122/76  138/82  95/61 Recheck 102/70 after water  104/76    Heart Rate (Admit)  69 bpm  86 bpm  87 bpm  80 bpm    Heart Rate (Exercise)  94 bpm  104 bpm  100 bpm  92 bpm    Heart Rate (Exit)  75 bpm  77 bpm  67 bpm  76 bpm    Rating of Perceived Exertion (Exercise)  11  12  12  13     Symptoms  none  none  none  none    Duration  Progress to 30 minutes of  aerobic without signs/symptoms of physical distress  Progress to 30 minutes of  aerobic without signs/symptoms of physical distress  Progress to 30 minutes of  aerobic without signs/symptoms of physical distress  Progress to 30 minutes of  aerobic without signs/symptoms of physical distress    Intensity  THRR unchanged  THRR unchanged  THRR unchanged  THRR unchanged      Progression   Progression  Continue to progress workloads to maintain intensity without signs/symptoms of physical distress.  Continue to progress workloads to maintain intensity without signs/symptoms of physical distress.  Continue to progress workloads to maintain intensity without signs/symptoms of physical distress.  Continue to progress workloads to maintain intensity without signs/symptoms of physical distress.    Average  METs  1.8  2  2.1  2.2      Resistance Training   Training Prescription  Yes  Yes  Yes  Yes    Weight  2 lbs.   2 lbs.   3lbs  2lbs    Reps  10-15  10-15  10-15  10-15    Time  10 Minutes  10 Minutes  10 Minutes  10 Minutes      Interval Training   Interval Training  No  No  No  No      NuStep   Level  2  3  3  4     SPM  75  75  75  75    Minutes  10  10  10  10     METs  1.6  1.7  2  2.1      Arm Ergometer   Level  1  1  1  2     Minutes  10  10  10  10       Track   Laps  6  8  7  8     Minutes  10  10  10  10     METs  2.03  2.39  2.23  2.39      Home Exercise Plan   Plans to continue exercise at  -  Home (comment) Plans to walk at home.  Home (comment) Plans to walk at home.  Home (comment) Plans to walk at home.    Frequency  -  Add 1 additional day to program exercise sessions.  Add 1 additional day to program exercise sessions.  Add 1 additional day to program exercise sessions.    Initial Home Exercises Provided  -  11/05/18  11/05/18  11/05/18       Exercise Comments: Exercise Comments    Row Name 10/29/18 1050 11/05/18 1007 11/19/18 1030       Exercise Comments  Patient tolerated low intensity exercise well, taking rest breaks as needed. SaO2 92%-93% room air with exercise.  Reviewed home exercise guidelines, METs, and goals with patient.  Reviewed METs and goals with patient.        Exercise Goals and Review: Exercise Goals    Row Name 10/23/18 0825             Exercise Goals   Increase Physical Activity  Yes       Intervention  Provide advice, education, support and counseling about physical activity/exercise needs.;Develop an individualized exercise prescription for aerobic and resistive training based on initial evaluation findings, risk stratification, comorbidities and participant's personal goals.       Expected Outcomes  Short Term: Attend rehab on a regular basis to increase amount of physical activity.       Increase Strength and Stamina  Yes        Intervention  Provide advice, education, support and counseling about physical activity/exercise needs.;Develop an individualized exercise prescription for aerobic and resistive training based on initial evaluation findings, risk stratification, comorbidities and participant's personal goals.       Expected Outcomes  Short Term: Increase workloads from initial exercise prescription for resistance, speed, and METs.       Able to understand and use rate of perceived exertion (RPE) scale  Yes       Intervention  Provide education and explanation on how to use RPE scale       Expected Outcomes  Short Term: Able to use RPE daily in rehab to express subjective intensity level;Long Term:  Able to use RPE to guide intensity level when exercising independently       Knowledge and understanding of Target Heart Rate Range (THRR)  Yes       Intervention  Provide education and explanation of THRR including how the numbers were predicted and where they are located for reference       Expected Outcomes  Short Term: Able to state/look up THRR;Long Term: Able to use THRR to govern intensity when exercising independently;Short Term: Able to use daily as guideline for intensity in rehab       Able to check pulse independently  Yes       Intervention  Provide education and demonstration on how to check pulse in carotid and radial arteries.;Review the importance of being able to check your own pulse for safety during independent exercise  Expected Outcomes  Short Term: Able to explain why pulse checking is important during independent exercise;Long Term: Able to check pulse independently and accurately       Understanding of Exercise Prescription  Yes       Intervention  Provide education, explanation, and written materials on patient's individual exercise prescription       Expected Outcomes  Short Term: Able to explain program exercise prescription;Long Term: Able to explain home exercise prescription to exercise  independently          Exercise Goals Re-Evaluation : Exercise Goals Re-Evaluation    Row Name 10/29/18 1343 11/05/18 1007 11/19/18 1030 11/28/18 1059       Exercise Goal Re-Evaluation   Exercise Goals Review  Increase Physical Activity;Able to understand and use rate of perceived exertion (RPE) scale  Increase Physical Activity;Able to understand and use rate of perceived exertion (RPE) scale;Knowledge and understanding of Target Heart Rate Range (THRR);Able to check pulse independently;Understanding of Exercise Prescription  Increase Physical Activity;Able to understand and use rate of perceived exertion (RPE) scale;Knowledge and understanding of Target Heart Rate Range (THRR);Able to check pulse independently;Understanding of Exercise Prescription  -    Comments  Patient able to understand and use RPE scale appropriately.  Reviewed home exercise guidelines with patient including THRR, RPE scale, and endpoints for exercise. Pamela Thomas is able to check her pulse using her Apple watch. Pamela Thomas plans to walk, weather permitting as her mode of home exercise.  Patient doens't feel like her stamina has improved thus far. Discussed increasing workloads, and Pamela Thomas is amenable to this. Pamela Thomas is going to [employer] to where she is a member to look into returning to exercise there. Pamela Thomas is walking some at home. Pamela Thomas mentioned at the end of exercise that she fell at home, she states while carrying laundry. Pamela Thomas states she had a "goose egg" on her leg.  Temporary department closure due to COVID-19.    Expected Outcomes  Increase workloads as tolerated to help improve cardiorespiratory fitness.  Patient will walk 30 minutes, 1-2 days/week in addition to exercise at cardiac rehab to help improve strength and endurance.  Patient plans to return to exerise at [employer] in addition to walking and exercise at cardiac rehab. Will increase workloads to help increase stamina.  -       Discharge Exercise Prescription (Final Exercise  Prescription Changes): Exercise Prescription Changes - 11/23/18 0955      Response to Exercise   Blood Pressure (Admit)  120/70    Blood Pressure (Exercise)  138/80    Blood Pressure (Exit)  104/76    Heart Rate (Admit)  80 bpm    Heart Rate (Exercise)  92 bpm    Heart Rate (Exit)  76 bpm    Rating of Perceived Exertion (Exercise)  13    Symptoms  none    Duration  Progress to 30 minutes of  aerobic without signs/symptoms of physical distress    Intensity  THRR unchanged      Progression   Progression  Continue to progress workloads to maintain intensity without signs/symptoms of physical distress.    Average METs  2.2      Resistance Training   Training Prescription  Yes    Weight  2lbs    Reps  10-15    Time  10 Minutes      Interval Training   Interval Training  No      NuStep   Level  4  SPM  75    Minutes  10    METs  2.1      Arm Ergometer   Level  2    Minutes  10      Track   Laps  8    Minutes  10    METs  2.39      Home Exercise Plan   Plans to continue exercise at  Home (comment)   Plans to walk at home.   Frequency  Add 1 additional day to program exercise sessions.    Initial Home Exercises Provided  11/05/18       Nutrition:  Target Goals: Understanding of nutrition guidelines, daily intake of sodium 1500mg , cholesterol 200mg , calories 30% from fat and 7% or less from saturated fats, daily to have 5 or more servings of fruits and vegetables.  Biometrics: Pre Biometrics - 10/23/18 1002      Pre Biometrics   Height  5\' 2"  (1.575 m)    Weight  237 lb 3.4 oz (107.6 kg)    Waist Circumference  45 inches    Hip Circumference  52 inches    Waist to Hip Ratio  0.87 %    BMI (Calculated)  43.38    Triceps Skinfold  48 mm    % Body Fat  54.2 %    Grip Strength  28 kg    Flexibility  10.5 in    Single Leg Stand  1.43 seconds        Nutrition Therapy Plan and Nutrition Goals: Nutrition Therapy & Goals - 10/23/18 1055      Nutrition  Therapy   Diet  heart healthy, carb modified      Personal Nutrition Goals   Nutrition Goal  Pamela Thomas to identify and limit food sources of saturated fat, trans fat, refined carbohydrates and sodium    Personal Goal #2  Pamela Thomas to identify food quantities necessary to achieve weight loss of 6-24 lb at graduation from cardiac rehab.    Personal Goal #3  Pamela Thomas to eat a variety of non-starchy vegetables.    Personal Goal #4  Pamela Thomas to watch out for sweets and added sugar      Intervention Plan   Intervention  Prescribe, educate and counsel regarding individualized specific dietary modifications aiming towards targeted core components such as weight, hypertension, lipid management, diabetes, heart failure and other comorbidities.    Expected Outcomes  Short Term Goal: Understand basic principles of dietary content, such as calories, fat, sodium, cholesterol and nutrients.;Long Term Goal: Adherence to prescribed nutrition plan.       Nutrition Assessments: Nutrition Assessments - 10/23/18 1057      MEDFICTS Scores   Pre Score  60       Nutrition Goals Re-Evaluation: Nutrition Goals Re-Evaluation    Row Name 10/23/18 1055             Goals   Current Weight  237 lb 3.4 oz (107.6 kg)          Nutrition Goals Re-Evaluation: Nutrition Goals Re-Evaluation    [place] Name 10/23/18 1055             Goals   Current Weight  237 lb 3.4 oz (107.6 kg)          Nutrition Goals Discharge (Final Nutrition Goals Re-Evaluation): Nutrition Goals Re-Evaluation - 10/23/18 1055      Goals   Current Weight  237 lb 3.4 oz (107.6 kg)  Psychosocial: Target Goals: Acknowledge presence or absence of significant depression and/or stress, maximize coping skills, provide positive support system. Participant is able to verbalize types and ability to use techniques and skills needed for reducing stress and depression.  Initial Review & Psychosocial Screening: Initial Psych Review & Screening - 10/23/18 1049       Initial Review   Current issues with  None Identified      Family Dynamics   Good Support System?  Yes   Pamela Thomas has her spouse, siblings, children and friends for support     Barriers   Psychosocial barriers to participate in program  There are no identifiable barriers or psychosocial needs.      Screening Interventions   Interventions  Encouraged to exercise       Quality of Life Scores: Quality of Life - 10/23/18 1004      Quality of Life   Select  Quality of Life      Quality of Life Scores   Health/Function Pre  28 %    Socioeconomic Pre  28 %    Psych/Spiritual Pre  30 %    Family Pre  30 %    GLOBAL Pre  28.71 %      Scores of 19 and below usually indicate a poorer quality of life in these areas.  A difference of  2-3 points is a clinically meaningful difference.  A difference of 2-3 points in the total score of the Quality of Life Index has been associated with significant improvement in overall quality of life, self-image, physical symptoms, and general health in studies assessing change in quality of life.  PHQ-9: Recent Review Flowsheet Data    Depression screen [facility] 2/9 02/28/2018 01/24/2018 01/12/2018 07/27/2016 08/12/2015   Decreased Interest 0 0 0 0 0   Down, Depressed, Hopeless 0 0 0 0 0   PHQ - 2 Score 0 0 0 0 0     Interpretation of Total Score  Total Score Depression Severity:  1-4 = Minimal depression, 5-9 = Mild depression, 10-14 = Moderate depression, 15-19 = Moderately severe depression, 20-27 = Severe depression   Psychosocial Evaluation and Intervention:   Psychosocial Re-Evaluation: Psychosocial Re-Evaluation    [place] Name 11/09/18 1654 11/29/18 1124           Psychosocial Re-Evaluation   Current issues with  None Identified  Current Stress Concerns      Comments  -  Unable to assess as Exercise at cardiac rehab is currently on hold per recommended guidelines to prevent the spread of COVID-19      Interventions  Encouraged to attend  Cardiac Rehabilitation for the exercise  -      Continue Psychosocial Services   No Follow up required  -         Psychosocial Discharge (Final Psychosocial Re-Evaluation): Psychosocial Re-Evaluation - 11/29/18 1124      Psychosocial Re-Evaluation   Current issues with  Current Stress Concerns    Comments  Unable to assess as Exercise at cardiac rehab is currently on hold per recommended guidelines to prevent the spread of COVID-19       Vocational Rehabilitation: Provide vocational rehab assistance to qualifying candidates.   Vocational Rehab Evaluation & Intervention: Vocational Rehab - 10/23/18 1051      Initial Vocational Rehab Evaluation & Intervention   Assessment shows need for Vocational Rehabilitation  No   Pamela Thomas is retired and does not need vocational rehab at this time.  Education: Education Goals: Education classes will be provided on a weekly basis, covering required topics. Participant will state understanding/return demonstration of topics presented.  Learning Barriers/Preferences: Learning Barriers/Preferences - 10/23/18 0859      Learning Barriers/Preferences   Learning Barriers  None    Learning Preferences  Individual Instruction;Group Instruction       Education Topics: Count Your Pulse:  -Group instruction provided by verbal instruction, demonstration, patient participation and written materials to support subject.  Instructors address importance of being able to find your pulse and how to count your pulse when at home without a heart monitor.  Patients get hands on experience counting their pulse with staff help and individually.   CARDIAC REHAB PHASE II EXERCISE from 11/23/2018 in [place]  Date  11/09/18  Instruction Review Code  2- Demonstrated Understanding      Heart Attack, Angina, and Risk Factor Modification:  -Group instruction provided by verbal instruction, video, and written materials to support  subject.  Instructors address signs and symptoms of angina and heart attacks.    Also discuss risk factors for heart disease and how to make changes to improve heart health risk factors.   Functional Fitness:  -Group instruction provided by verbal instruction, demonstration, patient participation, and written materials to support subject.  Instructors address safety measures for doing things around the house.  Discuss how to get up and down off the floor, how to pick things up properly, how to safely get out of a chair without assistance, and balance training.   Meditation and Mindfulness:  -Group instruction provided by verbal instruction, patient participation, and written materials to support subject.  Instructor addresses importance of mindfulness and meditation practice to help reduce stress and improve awareness.  Instructor also leads participants through a meditation exercise.    CARDIAC REHAB PHASE II EXERCISE from 11/23/2018 in [place]  Date  11/14/18  Instruction Review Code  2- Demonstrated Understanding      Stretching for Flexibility and Mobility:  -Group instruction provided by verbal instruction, patient participation, and written materials to support subject.  Instructors lead participants through series of stretches that are designed to increase flexibility thus improving mobility.  These stretches are additional exercise for major muscle groups that are typically performed during regular warm up and cool down.   Hands Only CPR:  -Group verbal, video, and participation provides a basic overview of AHA guidelines for community CPR. Role-play of emergencies allow participants the opportunity to practice calling for help and chest compression technique with discussion of AED use.   Hypertension: -Group verbal and written instruction that provides a basic overview of hypertension including the most recent diagnostic guidelines, risk factor  reduction with self-care instructions and medication management.   CARDIAC REHAB PHASE II EXERCISE from 11/23/2018 in [place]  Date  11/23/18  Instruction Review Code  2- Demonstrated Understanding       Nutrition I class: Heart Healthy Eating:  -Group instruction provided by PowerPoint slides, verbal discussion, and written materials to support subject matter. The instructor gives an explanation and review of the Therapeutic Lifestyle Changes diet recommendations, which includes a discussion on lipid goals, dietary fat, sodium, fiber, plant stanol/sterol esters, sugar, and the components of a well-balanced, healthy diet.   Nutrition II class: Lifestyle Skills:  -Group instruction provided by PowerPoint slides, verbal discussion, and written materials to support subject matter. The instructor gives an explanation and review of  label reading, grocery shopping for heart health, heart healthy recipe modifications, and ways to make healthier choices when eating out.   Diabetes Question & Answer:  -Group instruction provided by PowerPoint slides, verbal discussion, and written materials to support subject matter. The instructor gives an explanation and review of diabetes co-morbidities, pre- and post-prandial blood glucose goals, pre-exercise blood glucose goals, signs, symptoms, and treatment of hypoglycemia and hyperglycemia, and foot care basics.   Diabetes Blitz:  -Group instruction provided by PowerPoint slides, verbal discussion, and written materials to support subject matter. The instructor gives an explanation and review of the physiology behind type 1 and type 2 diabetes, diabetes medications and rational behind using different medications, pre- and post-prandial blood glucose recommendations and Hemoglobin A1c goals, diabetes diet, and exercise including blood glucose guidelines for exercising safely.    Portion Distortion:  -Group instruction provided  by PowerPoint slides, verbal discussion, written materials, and food models to support subject matter. The instructor gives an explanation of serving size versus portion size, changes in portions sizes over the last 20 years, and what consists of a serving from each food group.   Stress Management:  -Group instruction provided by verbal instruction, video, and written materials to support subject matter.  Instructors review role of stress in heart disease and how to cope with stress positively.     Exercising on Your Own:  -Group instruction provided by verbal instruction, power point, and written materials to support subject.  Instructors discuss benefits of exercise, components of exercise, frequency and intensity of exercise, and end points for exercise.  Also discuss use of nitroglycerin and activating EMS.  Review options of places to exercise outside of rehab.  Review guidelines for sex with heart disease.   Cardiac Drugs I:  -Group instruction provided by verbal instruction and written materials to support subject.  Instructor reviews cardiac drug classes: antiplatelets, anticoagulants, beta blockers, and statins.  Instructor discusses reasons, side effects, and lifestyle considerations for each drug class.   Cardiac Drugs II:  -Group instruction provided by verbal instruction and written materials to support subject.  Instructor reviews cardiac drug classes: angiotensin converting enzyme inhibitors (ACE-I), angiotensin II receptor blockers (ARBs), nitrates, and calcium channel blockers.  Instructor discusses reasons, side effects, and lifestyle considerations for each drug class.   Anatomy and Physiology of the Circulatory System:  Group verbal and written instruction and models provide basic cardiac anatomy and physiology, with the coronary electrical and arterial systems. Review of: AMI, Angina, Valve disease, Heart Failure, Peripheral Artery Disease, Cardiac Arrhythmia, Pacemakers, and  the ICD.   Other Education:  -Group or individual verbal, written, or video instructions that support the educational goals of the cardiac rehab program.   Holiday Eating Survival Tips:  -Group instruction provided by PowerPoint slides, verbal discussion, and written materials to support subject matter. The instructor gives patients tips, tricks, and techniques to help them not only survive but enjoy the holidays despite the onslaught of food that accompanies the holidays.   Knowledge Questionnaire Score: Knowledge Questionnaire Score - 10/23/18 0857      Knowledge Questionnaire Score   Pre Score  20/24       Core Components/Risk Factors/Patient Goals at Admission: Personal Goals and Risk Factors at Admission - 10/23/18 0858      Core Components/Risk Factors/Patient Goals on Admission    Weight Management  Yes;Obesity;Weight Maintenance;Weight Loss    Intervention  Weight Management: Develop a combined nutrition and exercise program designed to reach desired caloric intake,  while maintaining appropriate intake of nutrient and fiber, sodium and fats, and appropriate energy expenditure required for the weight goal.;Weight Management: Provide education and appropriate resources to help participant work on and attain dietary goals.;Weight Management/Obesity: Establish reasonable short term and long term weight goals.;Obesity: Provide education and appropriate resources to help participant work on and attain dietary goals.    Admit Weight  237 lb 3.4 oz (107.6 kg)    Goal Weight: Short Term  217 lb (98.4 kg)    Goal Weight: Long Term  187 lb (84.8 kg)    Expected Outcomes  Short Term: Continue to assess and modify interventions until short term weight is achieved;Long Term: Adherence to nutrition and physical activity/exercise program aimed toward attainment of established weight goal;Weight Maintenance: Understanding of the daily nutrition guidelines, which includes 25-35% calories from fat,  7% or less cal from saturated fats, less than 200mg  cholesterol, less than 1.5gm of sodium, & 5 or more servings of fruits and vegetables daily;Weight Loss: Understanding of general recommendations for a balanced deficit meal plan, which promotes 1-2 lb weight loss per week and includes a negative energy balance of [phone] kcal/d;Understanding recommendations for meals to include 15-35% energy as protein, 25-35% energy from fat, 35-60% energy from carbohydrates, less than 200mg  of dietary cholesterol, 20-35 gm of total fiber daily;Understanding of distribution of calorie intake throughout the day with the consumption of 4-5 meals/snacks    Improve shortness of breath with ADL's  Yes    Intervention  Provide education, individualized exercise plan and daily activity instruction to help decrease symptoms of SOB with activities of daily living.    Expected Outcomes  Short Term: Improve cardiorespiratory fitness to achieve a reduction of symptoms when performing ADLs;Long Term: Be able to perform more ADLs without symptoms or delay the onset of symptoms    Diabetes  Yes    Intervention  Provide education about signs/symptoms and action to take for hypo/hyperglycemia.;Provide education about proper nutrition, including hydration, and aerobic/resistive exercise prescription along with prescribed medications to achieve blood glucose in normal ranges: Fasting glucose 65-99 mg/dL    Expected Outcomes  Short Term: Participant verbalizes understanding of the signs/symptoms and immediate care of hyper/hypoglycemia, proper foot care and importance of medication, aerobic/resistive exercise and nutrition plan for blood glucose control.;Long Term: Attainment of HbA1C < 7%.    Hypertension  Yes    Intervention  Provide education on lifestyle modifcations including regular physical activity/exercise, weight management, moderate sodium restriction and increased consumption of fresh fruit, vegetables, and low fat dairy,  alcohol moderation, and smoking cessation.;Monitor prescription use compliance.    Expected Outcomes  Short Term: Continued assessment and intervention until BP is < 140/62mm HG in hypertensive participants. < 130/52mm HG in hypertensive participants with diabetes, heart failure or chronic kidney disease.;Long Term: Maintenance of blood pressure at goal levels.    Lipids  Yes    Intervention  Provide education and support for participant on nutrition & aerobic/resistive exercise along with prescribed medications to achieve LDL 70mg , HDL >40mg .    Expected Outcomes  Short Term: Participant states understanding of desired cholesterol values and is compliant with medications prescribed. Participant is following exercise prescription and nutrition guidelines.;Long Term: Cholesterol controlled with medications as prescribed, with individualized exercise RX and with personalized nutrition plan. Value goals: LDL < 70mg , HDL > 40 mg.    Stress  Yes    Intervention  Offer individual and/or small group education and counseling on adjustment to heart disease, stress management and  health-related lifestyle change. Teach and support self-help strategies.;Refer participants experiencing significant psychosocial distress to appropriate mental health specialists for further evaluation and treatment. When possible, include family members and significant others in education/counseling sessions.    Expected Outcomes  Short Term: Participant demonstrates changes in health-related behavior, relaxation and other stress management skills, ability to obtain effective social support, and compliance with psychotropic medications if prescribed.;Long Term: Emotional wellbeing is indicated by absence of clinically significant psychosocial distress or social isolation.       Core Components/Risk Factors/Patient Goals Review:  Goals and Risk Factor Review    Row Name 11/09/18 1655 11/29/18 1125           Core Components/Risk  Factors/Patient Goals Review   Personal Goals Review  Weight Management/Obesity;Lipids;Diabetes;Improve shortness of breath with ADL's  Weight Management/Obesity;Lipids;Diabetes;Improve shortness of breath with ADL's      Review  Avianah's oxygen saturations, vital signs and CBG's have been stable. Pamela Thomas is off to a slow start to exercise  Exercise at cardiac rehab is currently on hold per recommended guidelines to prevent the spread of COVID-19      Expected Outcomes  Pamela Thomas will continue to participate in phase 2 cardiac rehab for exercise, nutrition and lifestyle modifications  Pamela Thomas will continue to participate in phase 2 cardiac rehab for exercise, nutrition and lifestyle modifications once cardiac rehab resumes         Core Components/Risk Factors/Patient Goals at Discharge (Final Review):  Goals and Risk Factor Review - 11/29/18 1125      Core Components/Risk Factors/Patient Goals Review   Personal Goals Review  Weight Management/Obesity;Lipids;Diabetes;Improve shortness of breath with ADL's    Review  Exercise at cardiac rehab is currently on hold per recommended guidelines to prevent the spread of COVID-19    Expected Outcomes  Pamela Thomas will continue to participate in phase 2 cardiac rehab for exercise, nutrition and lifestyle modifications once cardiac rehab resumes       ITP Comments: ITP Comments    Row Name 10/23/18 0823 11/09/18 1649 11/29/18 1124       ITP Comments  Dr. Fransico Him, Medical Director  30 Day ITP Review.  Pamela Thomas is off to a good start to exercise.  30 Day ITP Review. Exercise at cardiac rehab is currently on hold per recommended guidelines to prevent the spread of COVID-19        Comments: See ITP comments.Barnet Pall, RN,BSN 12/03/2018 4:30 PM

## 2018-12-05 ENCOUNTER — Ambulatory Visit (HOSPITAL_COMMUNITY): Payer: BLUE CROSS/BLUE SHIELD

## 2018-12-05 ENCOUNTER — Encounter (HOSPITAL_COMMUNITY): Payer: BLUE CROSS/BLUE SHIELD

## 2018-12-06 ENCOUNTER — Encounter: Payer: Self-pay | Admitting: Cardiothoracic Surgery

## 2018-12-07 ENCOUNTER — Ambulatory Visit (HOSPITAL_COMMUNITY): Payer: BLUE CROSS/BLUE SHIELD

## 2018-12-07 ENCOUNTER — Encounter (HOSPITAL_COMMUNITY): Payer: BLUE CROSS/BLUE SHIELD

## 2018-12-10 ENCOUNTER — Ambulatory Visit (HOSPITAL_COMMUNITY): Payer: BLUE CROSS/BLUE SHIELD

## 2018-12-10 ENCOUNTER — Encounter (HOSPITAL_COMMUNITY): Payer: BLUE CROSS/BLUE SHIELD

## 2018-12-12 ENCOUNTER — Ambulatory Visit (HOSPITAL_COMMUNITY): Payer: BLUE CROSS/BLUE SHIELD

## 2018-12-12 ENCOUNTER — Encounter (HOSPITAL_COMMUNITY): Payer: BLUE CROSS/BLUE SHIELD

## 2018-12-14 ENCOUNTER — Encounter (HOSPITAL_COMMUNITY): Payer: BLUE CROSS/BLUE SHIELD

## 2018-12-14 ENCOUNTER — Ambulatory Visit (HOSPITAL_COMMUNITY): Payer: BLUE CROSS/BLUE SHIELD

## 2018-12-17 ENCOUNTER — Ambulatory Visit (HOSPITAL_COMMUNITY): Payer: BLUE CROSS/BLUE SHIELD

## 2018-12-17 ENCOUNTER — Encounter (HOSPITAL_COMMUNITY): Payer: BLUE CROSS/BLUE SHIELD

## 2018-12-19 ENCOUNTER — Ambulatory Visit (HOSPITAL_COMMUNITY): Payer: BLUE CROSS/BLUE SHIELD

## 2018-12-19 ENCOUNTER — Encounter (HOSPITAL_COMMUNITY): Payer: BLUE CROSS/BLUE SHIELD

## 2018-12-19 ENCOUNTER — Telehealth (HOSPITAL_COMMUNITY): Payer: Self-pay | Admitting: *Deleted

## 2018-12-19 NOTE — Telephone Encounter (Signed)
Called to notify patient that the cardiac and pulmonary rehabilitation department will be closed temporarily due to COVID-19 restrictions. Pt verbalized understanding.  Patient states she is walking at home as her mode of exercise at this time.  [name], [place], ACSM Juanito Doom 12/19/2018 [phone]

## 2018-12-21 ENCOUNTER — Ambulatory Visit (HOSPITAL_COMMUNITY): Payer: BLUE CROSS/BLUE SHIELD

## 2018-12-21 ENCOUNTER — Encounter (HOSPITAL_COMMUNITY): Payer: BLUE CROSS/BLUE SHIELD

## 2018-12-24 ENCOUNTER — Ambulatory Visit (HOSPITAL_COMMUNITY): Payer: BLUE CROSS/BLUE SHIELD

## 2018-12-24 ENCOUNTER — Encounter (HOSPITAL_COMMUNITY): Payer: BLUE CROSS/BLUE SHIELD

## 2018-12-26 ENCOUNTER — Ambulatory Visit (HOSPITAL_COMMUNITY): Payer: BLUE CROSS/BLUE SHIELD

## 2018-12-26 ENCOUNTER — Encounter (HOSPITAL_COMMUNITY): Payer: BLUE CROSS/BLUE SHIELD

## 2018-12-28 ENCOUNTER — Encounter (HOSPITAL_COMMUNITY): Payer: BLUE CROSS/BLUE SHIELD

## 2018-12-28 ENCOUNTER — Ambulatory Visit (HOSPITAL_COMMUNITY): Payer: BLUE CROSS/BLUE SHIELD

## 2018-12-31 ENCOUNTER — Ambulatory Visit (HOSPITAL_COMMUNITY): Payer: BLUE CROSS/BLUE SHIELD

## 2018-12-31 ENCOUNTER — Encounter (HOSPITAL_COMMUNITY): Payer: BLUE CROSS/BLUE SHIELD

## 2019-01-02 ENCOUNTER — Ambulatory Visit (HOSPITAL_COMMUNITY): Payer: BLUE CROSS/BLUE SHIELD

## 2019-01-02 ENCOUNTER — Encounter (HOSPITAL_COMMUNITY): Payer: BLUE CROSS/BLUE SHIELD

## 2019-01-04 ENCOUNTER — Ambulatory Visit (HOSPITAL_COMMUNITY): Payer: BLUE CROSS/BLUE SHIELD

## 2019-01-04 ENCOUNTER — Encounter (HOSPITAL_COMMUNITY): Payer: BLUE CROSS/BLUE SHIELD

## 2019-01-07 ENCOUNTER — Other Ambulatory Visit: Payer: Self-pay | Admitting: Family Medicine

## 2019-01-07 ENCOUNTER — Encounter (HOSPITAL_COMMUNITY): Payer: BLUE CROSS/BLUE SHIELD

## 2019-01-07 ENCOUNTER — Ambulatory Visit (HOSPITAL_COMMUNITY): Payer: BLUE CROSS/BLUE SHIELD

## 2019-01-09 ENCOUNTER — Ambulatory Visit (HOSPITAL_COMMUNITY): Payer: BLUE CROSS/BLUE SHIELD

## 2019-01-09 ENCOUNTER — Encounter (HOSPITAL_COMMUNITY): Payer: BLUE CROSS/BLUE SHIELD

## 2019-01-11 ENCOUNTER — Telehealth: Payer: Self-pay | Admitting: Internal Medicine

## 2019-01-11 ENCOUNTER — Ambulatory Visit (HOSPITAL_COMMUNITY): Payer: BLUE CROSS/BLUE SHIELD

## 2019-01-11 ENCOUNTER — Encounter (HOSPITAL_COMMUNITY): Payer: BLUE CROSS/BLUE SHIELD

## 2019-01-11 NOTE — Telephone Encounter (Signed)
Patient spoke with scheduler after this call. She has been changed to video virtual visit with plans for labs on Monday.

## 2019-01-11 NOTE — Telephone Encounter (Signed)
New Message:     Pt says she is supposed to actually see Dr Debara Pickett on 01-15-19. Is this correct? Please let pt know what type of visit she is supposed to have. Thank you.

## 2019-01-14 ENCOUNTER — Ambulatory Visit (HOSPITAL_COMMUNITY): Payer: BLUE CROSS/BLUE SHIELD

## 2019-01-14 ENCOUNTER — Telehealth: Payer: Self-pay | Admitting: Internal Medicine

## 2019-01-14 ENCOUNTER — Encounter (HOSPITAL_COMMUNITY): Payer: BLUE CROSS/BLUE SHIELD

## 2019-01-14 DIAGNOSIS — E785 Hyperlipidemia, unspecified: Secondary | ICD-10-CM | POA: Diagnosis not present

## 2019-01-14 LAB — LIPID PANEL
CHOLESTEROL TOTAL: 125 mg/dL (ref 100–199)
Chol/HDL Ratio: 3.7 ratio (ref 0.0–4.4)
HDL: 34 mg/dL — ABNORMAL LOW (ref >39)
LDL Calculated: 49 mg/dL (ref 0–99)
TRIGLYCERIDES: 212 mg/dL — AB (ref 0–149)
VLDL Cholesterol Cal: 42 mg/dL — ABNORMAL HIGH (ref 5–40)

## 2019-01-14 LAB — LDL CHOLESTEROL, DIRECT: LDL Direct: 63 mg/dL (ref 0–99)

## 2019-01-14 NOTE — Telephone Encounter (Signed)
Smartphone/consent/ my chart/ pre reg completed °

## 2019-01-15 ENCOUNTER — Telehealth (INDEPENDENT_AMBULATORY_CARE_PROVIDER_SITE_OTHER): Payer: BLUE CROSS/BLUE SHIELD | Admitting: Internal Medicine

## 2019-01-15 ENCOUNTER — Encounter: Payer: Self-pay | Admitting: Internal Medicine

## 2019-01-15 VITALS — Ht 62.0 in | Wt 237.0 lb

## 2019-01-15 DIAGNOSIS — I2510 Atherosclerotic heart disease of native coronary artery without angina pectoris: Secondary | ICD-10-CM | POA: Diagnosis not present

## 2019-01-15 DIAGNOSIS — I6523 Occlusion and stenosis of bilateral carotid arteries: Secondary | ICD-10-CM

## 2019-01-15 DIAGNOSIS — Z955 Presence of coronary angioplasty implant and graft: Secondary | ICD-10-CM | POA: Diagnosis not present

## 2019-01-15 DIAGNOSIS — Z951 Presence of aortocoronary bypass graft: Secondary | ICD-10-CM

## 2019-01-15 DIAGNOSIS — I10 Essential (primary) hypertension: Secondary | ICD-10-CM | POA: Diagnosis not present

## 2019-01-15 DIAGNOSIS — E782 Mixed hyperlipidemia: Secondary | ICD-10-CM

## 2019-01-15 DIAGNOSIS — Z7189 Other specified counseling: Secondary | ICD-10-CM

## 2019-01-15 MED ORDER — ATORVASTATIN CALCIUM 40 MG PO TABS: 40.0000 mg | ORAL_TABLET | Freq: Every day | ORAL | 3 refills | Status: DC

## 2019-01-15 NOTE — Progress Notes (Signed)
Virtual Visit via Video Note   This visit type was conducted due to national recommendations for restrictions regarding the COVID-19 Pandemic (e.g. social distancing) in an effort to limit this patient's exposure and mitigate transmission in our community.  Due to her co-morbid illnesses, this patient is at least at moderate risk for complications without adequate follow up.  This format is felt to be most appropriate for this patient at this time.  All issues noted in this document were discussed and addressed.  A limited physical exam was performed with this format.  Please refer to the patient's chart for her consent to telehealth for [facility].   Evaluation Performed:  Doximity video visit  Date:  01/15/2019   ID:  Pamela Thomas, DOB 1955-12-13, MRN [id number]  Patient Location:  [address] [place] [address]  Provider location:   [address] [place] [address] [place], [place] [address]  PCP:  Dickie La, MD  Cardiologist:  Minus Breeding, MD Electrophysiologist:  None   Chief Complaint:  Follow-up dyslipidemia  History of Present Illness:    Pamela Thomas is a 63 y.o. female who presents via audio/video conferencing for a telehealth visit today.  Pamela Thomas is seen today for video follow-up.  She has a history of mixed dyslipidemia with elevated LDL and high triglycerides and low HDL.  She also has morbid obesity, type 2 diabetes and had a recent acute inferior MI.  She is status post CABG and PCI.  I saw her in the lipid clinic in November and we added Vascepa 2 g twice daily to her current regimen.  She was on atorvastatin 80 mg.  She is also made some dietary changes.  She had a repeat lipid profile yesterday, menstruating a reduction in total cholesterol to 125, triglycerides 212 (down from 576), HDL 34 and LDL of 49.  She does report that despite the improvement in her numbers, she has been having some side effects from the atorvastatin.  She says every  day she reports achiness in her joints and bones after taking her statin medication the generally wears off after several hours and then comes back again.  This may likely be a side effect of the statin, particularly the high potency statin.  As she has well exceeded her target LDL less than 70, we may have room to decrease the dose somewhat.  The patient does not have symptoms concerning for COVID-19 infection (fever, chills, cough, or new SHORTNESS OF BREATH).    Prior CV studies:   The following studies were reviewed today:  Lab work  PMHx:  Past Medical History:  Diagnosis Date   ALLERGIC RHINITIS, SEASONAL    Coronary artery disease    Diabetes mellitus without complication ([place])    DYSLIPIDEMIA    Dysrhythmia    palpitations-evaluated by Dr Johnsie Cancel 6/12/[facility] 6/13   Glucose intolerance (impaired glucose tolerance)    History of blood transfusion    Hypertension    OBESITY, NOS    Osteoarthritis    rt knee   PLANTAR FASCIITIS, BILATERAL    Vertigo     Past Surgical History:  Procedure Laterality Date   BREAST BIOPSY  05/27/03   left   CARDIAC CATHETERIZATION     CARPAL TUNNEL RELEASE  05/27/03   Right   CESAREAN SECTION  05/27/03   CORONARY ARTERY BYPASS GRAFT N/A 08/17/2018   Procedure: CORONARY ARTERY BYPASS GRAFTING (CABG) TIMES TWO: LIMA to LAD, SVG to RAMUS INTERMEDIATE)  USING LEFT INTERNAL MAMMARY ARTERY AND RIGHT GREATER SAPHENOUS VEIN HARVESTED ENDOSCOPICALLY.;  Surgeon: [name], MD;  Location: [facility];  Service: Open Heart Surgery;  Laterality: N/A;   KNEE SURGERY     left- removal fatty tumor   ROTATOR CUFF REPAIR     Bilateral   TEE WITHOUT CARDIOVERSION N/A 08/17/2018   Procedure: TRANSESOPHAGEAL ECHOCARDIOGRAM (TEE);  Surgeon: [name], MD;  Location: [facility];  Service: Open Heart Surgery;  Laterality: N/A;   TOTAL KNEE ARTHROPLASTY  10/10/2012   Procedure: TOTAL KNEE ARTHROPLASTY;  Surgeon: Gearlean Alf, MD;   Location: [facility];  Service: Orthopedics;  Laterality: Right;    FAMHx:  Family History  Problem Relation Age of Onset   COPD Mother        Deceased   Lung cancer Father        Deceaesd   Diabetes Other    Melanoma Brother        Deceased   COPD Brother    Healthy Daughter     SOCHx:   reports that she quit smoking about 28 years ago. She has never used smokeless tobacco. She reports current alcohol use. She reports that she does not use drugs.  ALLERGIES:  Allergies  Allergen Reactions   Morphine And Related     Severe hallucinations, difficult arousal.    Statins     Myalgia pain moderate, flu like symptoms.    Oxycodone Nausea Only    Has to take nausea medications if taken. Prefer not to take    MEDS:  Current Meds  Medication Sig   acetaminophen (TYLENOL) 325 MG tablet Take 2 tablets (650 mg total) by mouth every 6 (six) hours as needed for mild pain.   amLODipine (NORVASC) 10 MG tablet TAKE 1 TABLET BY MOUTH EVERY DAY   aspirin 81 MG tablet Take 1 tablet (81 mg total) by mouth daily. (Patient taking differently: Take 81 mg by mouth every morning. )   candesartan (ATACAND) 32 MG tablet TAKE 1 TABLET BY MOUTH EVERY DAY   FLUoxetine (PROZAC) 20 MG tablet TAKE 1 TABLET BY MOUTH EVERY DAY (Patient taking differently: Take 20 mg by mouth every morning. )   furosemide (LASIX) 20 MG tablet Take 1 tablet (20 mg total) by mouth 2 (two) times daily. Patient does not want to split a 40 mg  Tab so please do not substitute   KLOR-CON M20 20 MEQ tablet TAKE 1 TABLET BY MOUTH EVERY DAY   metFORMIN (GLUCOPHAGE) 1000 MG tablet TAKE 1 TABLET (1,000 MG TOTAL) BY MOUTH 2 (TWO) TIMES DAILY WITH A MEAL.   metoprolol succinate (TOPROL-XL) 50 MG 24 hr tablet TAKE 1 TABLET BY MOUTH TWICE A DAY (Patient taking differently: Take 50 mg by mouth 2 (two) times daily. )   prasugrel (EFFIENT) 10 MG TABS tablet Take 10 mg by mouth daily.   VASCEPA 1 g CAPS Take 1 capsule by mouth  as directed.   [DISCONTINUED] atorvastatin (LIPITOR) 80 MG tablet Take 80 mg by mouth every evening.      ROS: Pertinent items noted in HPI and remainder of comprehensive ROS otherwise negative.  Labs/Other Tests and Data Reviewed:    Recent Labs: 08/13/2018: ALT 21; TSH 2.136 08/18/2018: Magnesium 2.3 08/25/2018: BUN 11; Creatinine, Ser 0.78; Hemoglobin 8.9; Platelets 283; Potassium 3.8; Sodium 142   Recent Lipid Panel Lab Results  Component Value Date/Time   CHOL 125 01/14/2019 10:05 AM   TRIG 212 (H) 01/14/2019 10:05  AM   HDL 34 (L) 01/14/2019 10:05 AM   CHOLHDL 3.7 01/14/2019 10:05 AM   CHOLHDL 5.5 05/11/2011 09:32 AM   LDLCALC 49 01/14/2019 10:05 AM   LDLDIRECT 63 01/14/2019 10:05 AM   LDLDIRECT 76 07/27/2016 10:48 AM    Wt Readings from Last 3 Encounters:  01/15/19 237 lb (107.5 kg)  10/23/18 237 lb 3.4 oz (107.6 kg)  10/15/18 236 lb 4 oz (107.2 kg)     Exam:    Vital Signs:  Ht 5\' 2"  (1.575 m)    Wt 237 lb (107.5 kg)    LMP 10/10/2010    BMI 43.35 kg/m    General appearance: alert, no distress and morbidly obese Lungs: No audible wheezing or visual respiratory difficulty Abdomen: Obese Extremities: extremities normal, atraumatic, no cyanosis or edema Skin: Normal skin color Neurologic: Mental status: Alert, oriented, thought content appropriate Psych: Pleasant  ASSESSMENT & PLAN:    1. Mixed dyslipidemia (elevated LDL and high triglycerides, low HDL) 2. Morbid obesity 3. Type 2 diabetes -controlled  4. Recent acute inferior MI 5. Status post CABG x1  Ms. Terry has had significant improvement in her dyslipidemia on Vascepa.  She also seems to have side effects from atorvastatin.  As she is well below target LDL less than 70, I think we can try to reduce her atorvastatin to 40 mg daily.  I offered her to take a statin holiday but she was reluctant to do that.  We will provide a new prescription as she does not want to cut her pills in half.  We will plan a  repeat lipid profile in 3 to 4 months and can follow-up with me in lipid clinic at that time.  COVID-19 Education: The signs and symptoms of COVID-19 were discussed with the patient and how to seek care for testing (follow up with PCP or arrange E-visit).  The importance of social distancing was discussed today.  Patient Risk:   After full review of this patients clinical status, I feel that they are at least moderate risk at this time.  Time:   Today, I have spent 25 minutes with the patient with telehealth technology discussing dyslipidemia, coronary disease, diabetes, weight loss and dietary/lifestyle modifications.     Medication Adjustments/Labs and Tests Ordered: Current medicines are reviewed at length with the patient today.  Concerns regarding medicines are outlined above.   Tests Ordered: No orders of the defined types were placed in this encounter.   Medication Changes: No orders of the defined types were placed in this encounter.   Disposition:  in 4 month(s)  Pixie Casino, MD, [facility], [place] Director of the Advanced Lipid Disorders &  Cardiovascular Risk Reduction Clinic Diplomate of the American Board of Clinical Lipidology Attending Cardiologist  Direct Dial: [phone]   Fax: [phone]  Website:  [email or web address]  Pixie Casino, MD  01/15/2019 10:27 AM

## 2019-01-15 NOTE — Patient Instructions (Signed)
Medication Instructions:  DECREASE atorvastatin to 40mg  daily If you need a refill on your cardiac medications before your next appointment, please call your pharmacy.   Lab work: FASTING lab work in about 4 months to check cholesterol   If you have labs (blood work) drawn today and your tests are completely normal, you will receive your results only by: Marland Kitchen MyChart Message (if you have MyChart) OR . A paper copy in the mail If you have any lab test that is abnormal or we need to change your treatment, we will call you to review the results.  Testing/Procedures: NONE  Follow-Up: Dr. Debara Pickett recommends that you schedule a follow up visit with him the in the [place] in 4 months.  Please have fasting blood work about 1 week prior to this visit and he will review the blood work results with you at your appointment.

## 2019-01-16 ENCOUNTER — Encounter (HOSPITAL_COMMUNITY): Payer: BLUE CROSS/BLUE SHIELD

## 2019-01-16 ENCOUNTER — Ambulatory Visit (HOSPITAL_COMMUNITY): Payer: BLUE CROSS/BLUE SHIELD

## 2019-01-18 ENCOUNTER — Ambulatory Visit (HOSPITAL_COMMUNITY): Payer: BLUE CROSS/BLUE SHIELD

## 2019-01-18 ENCOUNTER — Encounter (HOSPITAL_COMMUNITY): Payer: BLUE CROSS/BLUE SHIELD

## 2019-01-20 ENCOUNTER — Other Ambulatory Visit: Payer: Self-pay | Admitting: Family Medicine

## 2019-01-21 ENCOUNTER — Encounter (HOSPITAL_COMMUNITY): Payer: BLUE CROSS/BLUE SHIELD

## 2019-01-21 ENCOUNTER — Ambulatory Visit (HOSPITAL_COMMUNITY): Payer: BLUE CROSS/BLUE SHIELD

## 2019-01-23 ENCOUNTER — Ambulatory Visit (HOSPITAL_COMMUNITY): Payer: BLUE CROSS/BLUE SHIELD

## 2019-01-23 ENCOUNTER — Encounter (HOSPITAL_COMMUNITY): Payer: BLUE CROSS/BLUE SHIELD

## 2019-01-25 ENCOUNTER — Ambulatory Visit (HOSPITAL_COMMUNITY): Payer: BLUE CROSS/BLUE SHIELD

## 2019-01-25 ENCOUNTER — Encounter (HOSPITAL_COMMUNITY): Payer: BLUE CROSS/BLUE SHIELD

## 2019-01-25 ENCOUNTER — Ambulatory Visit: Payer: BLUE CROSS/BLUE SHIELD | Admitting: Family Medicine

## 2019-01-28 ENCOUNTER — Ambulatory Visit (HOSPITAL_COMMUNITY): Payer: BLUE CROSS/BLUE SHIELD

## 2019-01-28 ENCOUNTER — Encounter (HOSPITAL_COMMUNITY): Payer: BLUE CROSS/BLUE SHIELD

## 2019-01-30 ENCOUNTER — Ambulatory Visit: Payer: BLUE CROSS/BLUE SHIELD | Admitting: Family Medicine

## 2019-01-30 ENCOUNTER — Encounter (HOSPITAL_COMMUNITY): Payer: BLUE CROSS/BLUE SHIELD

## 2019-01-30 ENCOUNTER — Ambulatory Visit (HOSPITAL_COMMUNITY): Payer: BLUE CROSS/BLUE SHIELD

## 2019-01-30 ENCOUNTER — Other Ambulatory Visit: Payer: Self-pay

## 2019-01-30 ENCOUNTER — Encounter: Payer: Self-pay | Admitting: Family Medicine

## 2019-01-30 VITALS — BP 112/60 | HR 63 | Ht 62.0 in | Wt 245.1 lb

## 2019-01-30 DIAGNOSIS — F341 Dysthymic disorder: Secondary | ICD-10-CM | POA: Diagnosis not present

## 2019-01-30 DIAGNOSIS — E1140 Type 2 diabetes mellitus with diabetic neuropathy, unspecified: Secondary | ICD-10-CM | POA: Diagnosis not present

## 2019-01-30 DIAGNOSIS — Z951 Presence of aortocoronary bypass graft: Secondary | ICD-10-CM

## 2019-01-30 DIAGNOSIS — I10 Essential (primary) hypertension: Secondary | ICD-10-CM

## 2019-01-30 LAB — POCT GLYCOSYLATED HEMOGLOBIN (HGB A1C): HbA1c, POC (controlled diabetic range): 7.2 % — AB (ref 0.0–7.0)

## 2019-01-30 MED ORDER — ACETAMINOPHEN 500 MG PO TABS: ORAL_TABLET | ORAL | 5 refills | Status: DC

## 2019-01-30 NOTE — Assessment & Plan Note (Signed)
We discussed. She does not think she needs to increase fluoxetine. Realizes she is having normal reactions to multiple stressors. Continues to be engaged with her family. Looking forward to COVID restrictions being lifted and traveling. Discussed counseling and other options. She wantto stay with current meds and see how she does. She is very self aware.

## 2019-01-30 NOTE — Progress Notes (Signed)
    CHIEF COMPLAINT / HPI: 1. F/u heart disease Saw her cardiologist. He decreased her statin to 40 mg secondary to myalgia Overall her numbers are good No chest pains, no SOB, no exercise intolerance. 2. f/u DM.  He is checking her blood sugar at least once a day.  Usually 130-150 early in the morning.  Postprandial sometimes as high as 200 3. Stress and mood change; had MI in November, then stens and then CABG. Then COVID hit. She feels a little "off" but not depressed. Mood is stable.  Still enjoys caring for her grandchild who has ADHD and is recently been potentially diagnosed with intermittent explosive disorder.  Typically she and her husband travel a lot for entertainment and they have not been able to do that.  They also camp quite a bit and have not been able to do that either.  She just feels a little out of sorts.  Says her Prozac is still helping her mood because she does not get overly frustrated and does not have mood lability.  Sleeping okay.  Appetite is okay.  A little frustrated she is not been able to lose weight #4.  Wants to know if she can have a handicap sticker.  Also needs prescription of Tylenol as it will be cheaper. #5.  Having some pain at the top of her chest scar.  She plans to see the dermatologist.  It seems to pull and stretch.  Due to COVID she was unable to finish her full cardiac rehab but not sure if she really needs it now.  REVIEW OF SYSTEMS: See HPI.  PERTINENT  PMH / PSH: I have reviewed the patient's medications, allergies, past medical and surgical history, smoking status and updated in the EMR as appropriate.   OBJECTIVE:  Vital signs reviewed. GENERAL: Well-developed, well-nourished, no acute distress. CARDIOVASCULAR: Regular rate and rhythm no murmur gallop or rub LUNGS: Clear to auscultation bilaterally, no rales or wheeze. ABDOMEN: Soft positive bowel sounds NEURO: No gross focal neurological deficits. MSK: Movement of extremity x 4. Normal  muscle bulk, strength and tone. PSYCH: AxOx4. Good eye contact.. No psychomotor retardation or agitation. Appropriate speech fluency and content. Asks and answers questions appropriately. Mood is congruent. SKIN: sternal scar is well healed. Some hypertrophy and tenting at the proximal portion. No unusual erythema.     ASSESSMENT / PLAN:  No problem-specific Assessment & Plan notes found for this encounter.

## 2019-01-30 NOTE — Assessment & Plan Note (Signed)
Followed at lipid clionic On vascepa and atorvastatin

## 2019-01-30 NOTE — Assessment & Plan Note (Signed)
Plans to see dermatology for sar hypertrophy Unclear whether she would benefit from further cardiac rehab--she completed only 7 weeks but seems to be doing [place] with daily activities

## 2019-01-30 NOTE — Assessment & Plan Note (Signed)
Good control.  Current regimen.  She continues to be followed by cardiology as well.  I reviewed her recent blood work

## 2019-01-30 NOTE — Assessment & Plan Note (Signed)
Will check A1c today.  I am glad she is checking her blood sugar more regularly.  No medication changes.  Follow-up 3 to 4 months.

## 2019-02-05 ENCOUNTER — Telehealth (HOSPITAL_COMMUNITY): Payer: Self-pay | Admitting: *Deleted

## 2019-02-06 ENCOUNTER — Encounter (HOSPITAL_COMMUNITY): Payer: Self-pay

## 2019-02-06 NOTE — Progress Notes (Signed)
Transitioning to virtual cardiac rehab  Dr.  Percival Spanish   As you are aware our department remains closed to patients due to Covid-19.  We are excited to be able to offer an alternative to traditional onsite Cardiac Rehab while your patient continues to follow Re-Open guidelines.  This is a notification that your patient has been contacted and is very interested in participating in Virtual Cardiac Rehab.  Thank you for your continued support in helping [name] meet the health care needs of our patients.  Tedra Senegal. Support Rep II   Cardiac Rehab staff

## 2019-02-15 DIAGNOSIS — L910 Hypertrophic scar: Secondary | ICD-10-CM | POA: Diagnosis not present

## 2019-02-15 DIAGNOSIS — D485 Neoplasm of uncertain behavior of skin: Secondary | ICD-10-CM | POA: Diagnosis not present

## 2019-02-25 ENCOUNTER — Other Ambulatory Visit: Payer: Self-pay | Admitting: Family Medicine

## 2019-02-26 ENCOUNTER — Telehealth: Payer: Self-pay | Admitting: *Deleted

## 2019-02-26 NOTE — Telephone Encounter (Signed)
A message was left, re: follow up visit. 

## 2019-03-04 ENCOUNTER — Telehealth (HOSPITAL_COMMUNITY): Payer: Self-pay | Admitting: *Deleted

## 2019-03-04 NOTE — Telephone Encounter (Signed)
Pt is interested in participating in Virtual Cardiac Rehab. Pt advised that Virtual Cardiac Rehab is provided at no cost to the patient.  Checklist:  1. Pt has smart device  ie smartphone and/or ipad for downloading an app  Yes 2. Reliable internet/wifi service    Yes 3. Understands how to use their smartphone and navigate within an app.  Yes   Reviewed with pt the scheduling process for virtual cardiac rehab.  Pt verbalized understanding.           Confirm Consent - In the setting of the current Covid19 crisis, you are scheduled for a phone visit with your Cardiac or Pulmonary team member.  Just as we do with many in-gym visits, in order for you to participate in this visit, we must obtain consent.  If you'd like, I can send this to your mychart (if signed up) or email for you to review.  Otherwise, I can obtain your verbal consent now.  By agreeing to a telephone visit, we'd like you to understand that the technology does not allow for your Cardiac or Pulmonary Rehab team member to perform a physical assessment, and thus may limit their ability to fully assess your ability to perform exercise programs. If your provider identifies any concerns that need to be evaluated in person, we will make arrangements to do so.  Finally, though the technology is pretty good, we cannot assure that it will always work on either your or our end and we cannot ensure that we have a secure connection.  Cardiac and Pulmonary Rehab Telehealth visits and "At Home" cardiac and pulmonary rehab are provided at no cost to you.        Are you willing to proceed?"        STAFF: Did the patient verbally acknowledge consent to telehealth visit? Document YES/NO here: Yes     Barnet Pall RN  Cardiac and Pulmonary Rehab Staff        Date March 04, 2019    @ Time 14:57 pm

## 2019-03-05 ENCOUNTER — Encounter (HOSPITAL_COMMUNITY)
Admission: RE | Admit: 2019-03-05 | Discharge: 2019-03-05 | Disposition: A | Discharge status: Home / Self Care | Payer: Self-pay | Source: Ambulatory Visit | Attending: Cardiology | Admitting: Cardiology

## 2019-03-05 ENCOUNTER — Telehealth (HOSPITAL_COMMUNITY): Payer: Self-pay | Admitting: *Deleted

## 2019-03-05 ENCOUNTER — Encounter (HOSPITAL_COMMUNITY): Payer: Self-pay | Admitting: *Deleted

## 2019-03-05 DIAGNOSIS — Z951 Presence of aortocoronary bypass graft: Secondary | ICD-10-CM | POA: Insufficient documentation

## 2019-03-05 NOTE — Telephone Encounter (Signed)
Called and spoke to pt regarding Virtual Cardiac Rehab.  Pt  was able to download the [facility] app on their smart device with no issues. Pt set up their account and received the following welcome message -"Welcome to the [place] and Pulmonary Rehabilitation program. We hope that you will find the exercise program beneficial in your recovery process. Our staff is available to assist with any questions/concerns about your exercise routine. Best wishes". Brief orientation provided to with the advisement to watch the "Intro to Rehab" series located under the Resource tab. Pt verbalized understanding. Will continue to follow and monitor pt progress with feedback as needed.  [name] would like to be discharged from phase 2 cardiac rehab as she has signed up for low impact piliates at the end of this week.Barnet Pall, RN,BSN 03/05/2019 9:21 AM

## 2019-03-05 NOTE — Progress Notes (Signed)
Discharge Progress Report  Patient Details  Name: Pamela Thomas MRN: [id number] Date of Birth: 1955/11/25 Referring Provider:     CARDIAC REHAB PHASE II ORIENTATION from 10/23/2018 in [place]  Referring Provider  Dr. Percival Spanish       Number of Visits: 11  Reason for Discharge:  Early Exit:  Cardiac rehab is closed due to the COVID 19 Pandemic  Smoking History:  Social History   Tobacco Use  Smoking Status Former Smoker  . Quit date: 09/12/1990  . Years since quitting: 28.4  Smokeless Tobacco Never Used    Diagnosis:  No diagnosis found.  ADL UCSD:   Initial Exercise Prescription:   Discharge Exercise Prescription (Final Exercise Prescription Changes):   Functional Capacity:   Psychological, QOL, Others - Outcomes: PHQ 2/9: Depression screen [facility] 2/9 02/28/2018 01/24/2018 01/12/2018 07/27/2016 08/12/2015  Decreased Interest 0 0 0 0 0  Down, Depressed, Hopeless 0 0 0 0 0  PHQ - 2 Score 0 0 0 0 0  Some recent data might be hidden    Quality of Life:   Personal Goals: Goals established at orientation with interventions provided to work toward goal.    Personal Goals Discharge:   Exercise Goals and Review:   Exercise Goals Re-Evaluation:   Nutrition & Weight - Outcomes:    Nutrition:   Nutrition Discharge:   Education Questionnaire Score:  Orene attended 11 exercise sessions between 10/23/18-11/23/18. Zalea did well with exercise and had good attendance. Cardiac rehab closed in March due to the [place] 19 pandemic. Yuliza has decided to use virtual cardiac rehab and will be discharged from phase 2 cardiac rehab. Magdelene says she has signed up to take a low impact piliates class that starts at the end of the week.Barnet Pall, RN,BSN 03/05/2019 9:45 AM

## 2019-03-07 ENCOUNTER — Telehealth (HOSPITAL_COMMUNITY): Payer: Self-pay

## 2019-03-07 NOTE — Telephone Encounter (Signed)
Called pt to discuss heart healthy eating, no answer. Left voicemail requesting pt call dietitian back. Distributed contact information.  Laurina Bustle MS RD LDN 03/07/19   9:39 am

## 2019-03-20 ENCOUNTER — Telehealth: Payer: Self-pay | Admitting: *Deleted

## 2019-03-20 ENCOUNTER — Other Ambulatory Visit: Payer: Self-pay

## 2019-03-20 ENCOUNTER — Ambulatory Visit: Payer: BLUE CROSS/BLUE SHIELD | Admitting: Family Medicine

## 2019-03-20 ENCOUNTER — Encounter: Payer: Self-pay | Admitting: Family Medicine

## 2019-03-20 DIAGNOSIS — R0989 Other specified symptoms and signs involving the circulatory and respiratory systems: Secondary | ICD-10-CM | POA: Diagnosis not present

## 2019-03-20 DIAGNOSIS — I10 Essential (primary) hypertension: Secondary | ICD-10-CM

## 2019-03-20 DIAGNOSIS — R0600 Dyspnea, unspecified: Secondary | ICD-10-CM | POA: Insufficient documentation

## 2019-03-20 DIAGNOSIS — R0601 Orthopnea: Secondary | ICD-10-CM | POA: Diagnosis not present

## 2019-03-20 DIAGNOSIS — E1140 Type 2 diabetes mellitus with diabetic neuropathy, unspecified: Secondary | ICD-10-CM | POA: Diagnosis not present

## 2019-03-20 DIAGNOSIS — I5030 Unspecified diastolic (congestive) heart failure: Secondary | ICD-10-CM | POA: Insufficient documentation

## 2019-03-20 LAB — POCT GLYCOSYLATED HEMOGLOBIN (HGB A1C): HbA1c, POC (controlled diabetic range): 7.1 % — AB (ref 0.0–7.0)

## 2019-03-20 NOTE — Telephone Encounter (Signed)
Contacted pt to inform her that her Echo is scheduled at [facility] on 03/25/2019 @3pm .  She is to go in at entrance A and register. Ahna Konkle Zimmerman Rumple, CMA

## 2019-03-20 NOTE — Progress Notes (Signed)
Established Patient Office Visit  Subjective:  Patient ID: Pamela Thomas, female    DOB: 1955/10/04  Age: 63 y.o. MRN: [id number]  CC:  Chief Complaint  Patient presents with  . Breathing Problem    HPI Pamela Thomas presents for new orthopnea and dyspnea on exertion.  S/P CABG ~7 months ago.  Since then has had dyspnea on exertion, orthopnea and swelling of ankles.  Had an echo around the time of her CABG which suggested diastolic dysfunction.  No recent labs.   Remote smoker.  Pack year hx does not put her at particular risk for COPD.   No chest or jaw pain. No reason to suspect recent ischemic event.   Past Medical History:  Diagnosis Date  . ALLERGIC RHINITIS, SEASONAL   . Coronary artery disease   . Diabetes mellitus without complication ([place])   . DYSLIPIDEMIA   . Dysrhythmia    palpitations-evaluated by Dr Johnsie Cancel 6/12/eccho 6/13  . Glucose intolerance (impaired glucose tolerance)   . History of blood transfusion   . Hypertension   . OBESITY, NOS   . Osteoarthritis    rt knee  . PLANTAR FASCIITIS, BILATERAL   . Vertigo     Past Surgical History:  Procedure Laterality Date  . BREAST BIOPSY  05/27/03   left  . CARDIAC CATHETERIZATION    . CARPAL TUNNEL RELEASE  05/27/03   Right  . CESAREAN SECTION  05/27/03  . CORONARY ARTERY BYPASS GRAFT N/A 08/17/2018   Procedure: CORONARY ARTERY BYPASS GRAFTING (CABG) TIMES TWO: LIMA to LAD, SVG to RAMUS INTERMEDIATE)  USING LEFT INTERNAL MAMMARY ARTERY AND RIGHT GREATER SAPHENOUS VEIN HARVESTED ENDOSCOPICALLY.;  Surgeon: [name], MD;  Location: [place];  Service: Open Heart Surgery;  Laterality: N/A;  . KNEE SURGERY     left- removal fatty tumor  . ROTATOR CUFF REPAIR     Bilateral  . TEE WITHOUT CARDIOVERSION N/A 08/17/2018   Procedure: TRANSESOPHAGEAL ECHOCARDIOGRAM (TEE);  Surgeon: [name], MD;  Location: [place];  Service: Open Heart Surgery;  Laterality: N/A;  . TOTAL KNEE ARTHROPLASTY  10/10/2012    Procedure: TOTAL KNEE ARTHROPLASTY;  Surgeon: Gearlean Alf, MD;  Location: [facility];  Service: Orthopedics;  Laterality: Right;    Family History  Problem Relation Age of Onset  . COPD Mother        Deceased  . Lung cancer Father        Deceaesd  . Diabetes Other   . Melanoma Brother        Deceased  . COPD Brother   . Healthy Daughter     Social History   Socioeconomic History  . Marital status: Married    Spouse name: Not on file  . Number of children: 2  . Years of education: Not on file  . Highest education level: High school graduate  Occupational History  . Occupation: retired  Scientific laboratory technician  . Financial resource strain: Not hard at all  . Food insecurity    Worry: Never true    Inability: Never true  . Transportation needs    Medical: No    Non-medical: No  Tobacco Use  . Smoking status: Former Smoker    Quit date: 09/12/1990    Years since quitting: 28.5  . Smokeless tobacco: Never Used  Substance and Sexual Activity  . Alcohol use: Yes    Alcohol/week: 0.0 standard drinks    Comment: rare  . Drug use: No  .  Sexual activity: Not on file  Lifestyle  . Physical activity    Days per week: 0 days    Minutes per session: 0 min  . Stress: Only a little  Relationships  . Social Herbalist on phone: Not on file    Gets together: Not on file    Attends religious service: Not on file    Active member of club or organization: Not on file    Attends meetings of clubs or organizations: Not on file    Relationship status: Not on file  . Intimate partner violence    Fear of current or ex partner: Not on file    Emotionally abused: Not on file    Physically abused: Not on file    Forced sexual activity: Not on file  Other Topics Concern  . Not on file  Social History Narrative   Lives with husband in a split level home.  Has 3 children.     Recently Retired   Highest level of education:  12th grade    Outpatient Medications Prior to Visit   Medication Sig Dispense Refill  . acetaminophen (TYLENOL) 500 MG tablet Take one or two tablets every 8 hours as needed max number of 6 tabs in 24 hours 200 tablet 5  . amLODipine (NORVASC) 10 MG tablet TAKE 1 TABLET BY MOUTH EVERY DAY 30 tablet 10  . aspirin 81 MG tablet Take 1 tablet (81 mg total) by mouth daily. (Patient taking differently: Take 81 mg by mouth every morning. ) 100 tablet 3  . atorvastatin (LIPITOR) 40 MG tablet Take 1 tablet (40 mg total) by mouth daily. 90 tablet 3  . candesartan (ATACAND) 32 MG tablet TAKE 1 TABLET BY MOUTH EVERY DAY 90 tablet 3  . FLUoxetine (PROZAC) 20 MG tablet TAKE 1 TABLET BY MOUTH EVERY DAY 90 tablet 3  . furosemide (LASIX) 20 MG tablet TAKE 1 TABLET BY MOUTH TWICE A DAY 180 tablet 3  . KLOR-CON M20 20 MEQ tablet TAKE 1 TABLET BY MOUTH EVERY DAY 90 tablet 3  . metFORMIN (GLUCOPHAGE) 1000 MG tablet TAKE 1 TABLET (1,000 MG TOTAL) BY MOUTH 2 (TWO) TIMES DAILY WITH A MEAL. 180 tablet 3  . metoprolol succinate (TOPROL-XL) 50 MG 24 hr tablet TAKE 1 TABLET BY MOUTH TWICE A DAY (Patient taking differently: Take 50 mg by mouth 2 (two) times daily. ) 180 tablet 3  . prasugrel (EFFIENT) 10 MG TABS tablet Take 10 mg by mouth daily.    Marland Kitchen VASCEPA 1 g CAPS Take 1 capsule by mouth as directed.     No facility-administered medications prior to visit.     Allergies  Allergen Reactions  . Morphine And Related     Severe hallucinations, difficult arousal.   . Statins     Myalgia pain moderate, flu like symptoms.   . Oxycodone Nausea Only    Has to take nausea medications if taken. Prefer not to take    ROS Review of Systems    Objective:    Physical Exam  BP 124/68   Pulse 63   Wt 249 lb 9.6 oz (113.2 kg)   LMP 10/10/2010   SpO2 98%   BMI 45.65 kg/m  Wt Readings from Last 3 Encounters:  03/20/19 249 lb 9.6 oz (113.2 kg)  01/30/19 245 lb 2 oz (111.2 kg)  01/15/19 237 lb (107.5 kg)   Wt up a little. Neck minimal JVD Lungs clear, no rales  Cardiac RRR  without m or g Abd benign Ext 2 = bilateral edema.  Health Maintenance Due  Topic Date Due  . PAP SMEAR-Modifier  06/14/2014  . OPHTHALMOLOGY EXAM  01/06/2016  . FOOT EXAM  08/11/2016    There are no preventive care reminders to display for this patient.  Lab Results  Component Value Date   TSH 2.136 08/13/2018   Lab Results  Component Value Date   WBC 8.9 08/25/2018   HGB 8.9 (L) 08/25/2018   HCT 30.2 (L) 08/25/2018   MCV 95.0 08/25/2018   PLT 283 08/25/2018   Lab Results  Component Value Date   NA 142 08/25/2018   K 3.8 08/25/2018   CO2 33 (H) 08/25/2018   GLUCOSE 147 (H) 08/25/2018   BUN 11 08/25/2018   CREATININE 0.78 08/25/2018   BILITOT 0.6 08/13/2018   ALKPHOS 63 08/13/2018   AST 21 08/13/2018   ALT 21 08/13/2018   PROT 7.3 08/13/2018   ALBUMIN 3.9 08/13/2018   CALCIUM 8.8 (L) 08/25/2018   ANIONGAP 11 08/25/2018   Lab Results  Component Value Date   CHOL 125 01/14/2019   Lab Results  Component Value Date   HDL 34 (L) 01/14/2019   Lab Results  Component Value Date   LDLCALC 49 01/14/2019   Lab Results  Component Value Date   TRIG 212 (H) 01/14/2019   Lab Results  Component Value Date   CHOLHDL 3.7 01/14/2019   Lab Results  Component Value Date   HGBA1C 7.1 (A) 03/20/2019      Assessment & Plan:   Problem List Items Addressed This Visit    Dyspnea    Clinical presentation is most consistent with CHF.      Relevant Orders   Brain natriuretic peptide   ECHOCARDIOGRAM COMPLETE   HYPERTENSION, BENIGN SYSTEMIC   Relevant Orders   CBC   CMP14+EGFR   TSH   Type 2 diabetes mellitus with sensory neuropathy (HCC)   Relevant Orders   POCT glycosylated hemoglobin (Hb A1C) (Completed)      No orders of the defined types were placed in this encounter.   Follow-up: No follow-ups on file.    Zenia Resides, MD

## 2019-03-20 NOTE — Assessment & Plan Note (Addendum)
Clinical picture suggests new CHF (diastolic?)  Will check labs and echo.  For now, increase lasix to tid Has appointment with cards in one month.  Given results of echo and labs which reinforce idea of diastolic CHF.  She will see cards in one month.  See myself or Dr. Nori Riis in intermim if symptoms not controled.

## 2019-03-20 NOTE — Assessment & Plan Note (Signed)
Clinical presentation is most consistent with CHF.

## 2019-03-20 NOTE — Assessment & Plan Note (Signed)
Well controled on current meds 

## 2019-03-20 NOTE — Patient Instructions (Signed)
I will call Tuesday with the blood test results. I worry you have diastolic dysfunction congestive heart failure - something generally well handled by medications. Until you hear from me, increase your lasix to three times per day.

## 2019-03-20 NOTE — Assessment & Plan Note (Signed)
Check A1C 

## 2019-03-21 LAB — CMP14+EGFR
ALT: 12 IU/L (ref 0–32)
AST: 17 IU/L (ref 0–40)
Albumin/Globulin Ratio: 1.7 (ref 1.2–2.2)
Albumin: 4.1 g/dL (ref 3.8–4.8)
Alkaline Phosphatase: 77 IU/L (ref 39–117)
BUN/Creatinine Ratio: 16 (ref 12–28)
BUN: 15 mg/dL (ref 8–27)
Bilirubin Total: 0.2 mg/dL (ref 0.0–1.2)
CO2: 25 mmol/L (ref 20–29)
Calcium: 9.8 mg/dL (ref 8.7–10.3)
Chloride: 99 mmol/L (ref 96–106)
Creatinine, Ser: 0.95 mg/dL (ref 0.57–1.00)
GFR calc Af Amer: 74 mL/min/{1.73_m2} (ref >59)
GFR calc non Af Amer: 64 mL/min/{1.73_m2} (ref >59)
Globulin, Total: 2.4 g/dL (ref 1.5–4.5)
Glucose: 89 mg/dL (ref 65–99)
Potassium: 4.2 mmol/L (ref 3.5–5.2)
Sodium: 141 mmol/L (ref 134–144)
Total Protein: 6.5 g/dL (ref 6.0–8.5)

## 2019-03-21 LAB — CBC
Hematocrit: 36.2 % (ref 34.0–46.6)
Hemoglobin: 12 g/dL (ref 11.1–15.9)
MCH: 26.3 pg — ABNORMAL LOW (ref 26.6–33.0)
MCHC: 33.1 g/dL (ref 31.5–35.7)
MCV: 79 fL (ref 79–97)
Platelets: 280 10*3/uL (ref 150–450)
RBC: 4.56 x10E6/uL (ref 3.77–5.28)
RDW: 16.1 % — ABNORMAL HIGH (ref 11.7–15.4)
WBC: 8.9 10*3/uL (ref 3.4–10.8)

## 2019-03-21 LAB — TSH: TSH: 2.89 u[IU]/mL (ref 0.450–4.500)

## 2019-03-21 LAB — BRAIN NATRIURETIC PEPTIDE: BNP: 208.4 pg/mL — ABNORMAL HIGH (ref 0.0–100.0)

## 2019-03-25 ENCOUNTER — Other Ambulatory Visit: Payer: Self-pay

## 2019-03-25 ENCOUNTER — Encounter (HOSPITAL_COMMUNITY): Payer: Self-pay

## 2019-03-25 ENCOUNTER — Ambulatory Visit (HOSPITAL_COMMUNITY)
Admission: RE | Admit: 2019-03-25 | Discharge: 2019-03-25 | Disposition: A | Discharge status: Home / Self Care | Payer: BC Managed Care – PPO | Source: Ambulatory Visit | Attending: Family Medicine | Admitting: Family Medicine

## 2019-03-25 DIAGNOSIS — Z951 Presence of aortocoronary bypass graft: Secondary | ICD-10-CM | POA: Insufficient documentation

## 2019-03-25 DIAGNOSIS — R0601 Orthopnea: Secondary | ICD-10-CM | POA: Insufficient documentation

## 2019-03-25 DIAGNOSIS — I2510 Atherosclerotic heart disease of native coronary artery without angina pectoris: Secondary | ICD-10-CM | POA: Diagnosis not present

## 2019-03-25 DIAGNOSIS — Z87891 Personal history of nicotine dependence: Secondary | ICD-10-CM | POA: Diagnosis not present

## 2019-03-25 DIAGNOSIS — E119 Type 2 diabetes mellitus without complications: Secondary | ICD-10-CM | POA: Diagnosis not present

## 2019-03-25 DIAGNOSIS — I110 Hypertensive heart disease with heart failure: Secondary | ICD-10-CM | POA: Diagnosis not present

## 2019-03-25 DIAGNOSIS — I509 Heart failure, unspecified: Secondary | ICD-10-CM | POA: Diagnosis not present

## 2019-03-25 DIAGNOSIS — I358 Other nonrheumatic aortic valve disorders: Secondary | ICD-10-CM | POA: Insufficient documentation

## 2019-03-25 NOTE — Progress Notes (Signed)
Pt has been inactive in the [place] for weeks. Pt has not returned messages or calls. Pt will be mailed a letter with instructions to contact us. If pt has not contacted [name] by 7/27, pt will be deleted from the app.

## 2019-03-25 NOTE — Progress Notes (Signed)
  Echocardiogram 2D Echocardiogram has been performed.  Pamela Thomas 03/25/2019, 3:58 PM

## 2019-04-01 ENCOUNTER — Telehealth (HOSPITAL_COMMUNITY): Payer: Self-pay | Admitting: *Deleted

## 2019-04-01 NOTE — Telephone Encounter (Signed)
Pt inactive in [facility] App.  Letter mailed requesting patient to return call regarding this by 04/05/2019. If no response, will discharge from cardiac rehab program.  

## 2019-04-25 NOTE — Progress Notes (Signed)
Cardiology Office Note   Date:  04/26/2019   ID:  Pamela Thomas, DOB 11-17-1955, MRN [id number]  PCP:  Pamela La, MD  Cardiologist:   Pamela Breeding, MD   Chief Complaint  Patient presents with  . Shortness of Breath      History of Present Illness: Pamela Thomas is a 63 y.o. female who presents for evaluation of coronary disease.  She had an inferior infarct in [place].   She had an occluded right coronary artery.  She also had LAD 80% stenosis proximally at the takeoff of a prominent and tortuous diagonal branch.  Circumflex is very small and free of high-grade disease.  Her EF was well preserved with some probable mild inferior hypokinesis.  She had overlapping Synergy stents.  It was suggested that she have elective PCI of her LAD versus a LIMA.  She is now status post CABG.    Since I last saw her she has had increased dyspnea lying flat back.  Evaluation included her from doing some of the exercises she was trying to do.  She feels like her breathing is off and she has to take deep breaths.  She has had some swelling.  Not classically describing PND or orthopnea.  She is not having any palpitations, presyncope or syncope.  He has not been able to be as active because she could do cardiac rehab.  She did have an echocardiogram there was some suggestion of diastolic dysfunction.  BNP was very slightly elevated.  She was treated with diuretics but without much improvement.  Past Medical History:  Diagnosis Date  . ALLERGIC RHINITIS, SEASONAL   . BCC (basal cell carcinoma of skin) 2005   N[facility])  . Coronary artery disease   . Diabetes mellitus without complication ([place])   . DYSLIPIDEMIA   . Dysrhythmia    palpitations-evaluated by Dr Johnsie Cancel 6/12/eccho 6/13  . Glucose intolerance (impaired glucose tolerance)   . History of blood transfusion   . Hypertension   . OBESITY, NOS   . Osteoarthritis    rt knee  . PLANTAR FASCIITIS, BILATERAL   .  Vertigo     Past Surgical History:  Procedure Laterality Date  . BREAST BIOPSY  05/27/03   left  . CARDIAC CATHETERIZATION    . CARPAL TUNNEL RELEASE  05/27/03   Right  . CESAREAN SECTION  05/27/03  . CORONARY ARTERY BYPASS GRAFT N/A 08/17/2018   Procedure: CORONARY ARTERY BYPASS GRAFTING (CABG) TIMES TWO: LIMA to LAD, SVG to RAMUS INTERMEDIATE)  USING LEFT INTERNAL MAMMARY ARTERY AND RIGHT GREATER SAPHENOUS VEIN HARVESTED ENDOSCOPICALLY.;  Surgeon: [name], MD;  Location: [place];  Service: Open Heart Surgery;  Laterality: N/A;  . KNEE SURGERY     left- removal fatty tumor  . ROTATOR CUFF REPAIR     Bilateral  . TEE WITHOUT CARDIOVERSION N/A 08/17/2018   Procedure: TRANSESOPHAGEAL ECHOCARDIOGRAM (TEE);  Surgeon: [name], MD;  Location: [place];  Service: Open Heart Surgery;  Laterality: N/A;  . TOTAL KNEE ARTHROPLASTY  10/10/2012   Procedure: TOTAL KNEE ARTHROPLASTY;  Surgeon: Gearlean Alf, MD;  [facility];  Service: Orthopedics;  Laterality: Right;     Current Outpatient Medications  Medication Sig Dispense Refill  . acetaminophen (TYLENOL) 500 MG tablet Take one or two tablets every 8 hours as needed max number of 6 tabs in 24 hours 200 tablet 5  . amLODipine (NORVASC) 10 MG tablet TAKE 1 TABLET  BY MOUTH EVERY DAY 30 tablet 10  . aspirin 81 MG tablet Take 1 tablet (81 mg total) by mouth daily. (Patient taking differently: Take 81 mg by mouth every morning. ) 100 tablet 3  . atorvastatin (LIPITOR) 40 MG tablet Take 1 tablet (40 mg total) by mouth daily. 90 tablet 3  . candesartan (ATACAND) 32 MG tablet TAKE 1 TABLET BY MOUTH EVERY DAY 90 tablet 3  . FLUoxetine (PROZAC) 20 MG tablet TAKE 1 TABLET BY MOUTH EVERY DAY 90 tablet 3  . furosemide (LASIX) 20 MG tablet TAKE 1 TABLET BY MOUTH TWICE A DAY 180 tablet 3  . KLOR-CON M20 20 MEQ tablet TAKE 1 TABLET BY MOUTH EVERY DAY 90 tablet 3  . metFORMIN (GLUCOPHAGE) 1000 MG tablet TAKE 1 TABLET (1,000 MG TOTAL) BY MOUTH  2 (TWO) TIMES DAILY WITH A MEAL. 180 tablet 3  . metoprolol succinate (TOPROL-XL) 50 MG 24 hr tablet TAKE 1 TABLET BY MOUTH TWICE A DAY (Patient taking differently: Take 50 mg by mouth 2 (two) times daily. ) 180 tablet 3  . VASCEPA 1 g CAPS Take 1 capsule by mouth as directed.    . chlorthalidone (HYGROTON) 25 MG tablet Take 1 tablet (25 mg total) by mouth daily. 90 tablet 1  . clopidogrel (PLAVIX) 75 MG tablet Take 1 tablet (75 mg total) by mouth daily. 90 tablet 3   No current facility-administered medications for this visit.     Allergies:   Morphine and related, Statins, and Oxycodone    ROS:  Please see the history of present illness.   Otherwise, review of systems are positive for none.   All other systems are reviewed and negative.    PHYSICAL EXAM: VS:  BP (!) 154/85   Pulse 70   Temp (!) 96.5 F (35.8 C)   Ht 5\' 2"  (1.575 m)   Wt 250 lb (113.4 kg)   LMP 10/10/2010   SpO2 95%   BMI 45.73 kg/m  , BMI Body mass index is 45.73 kg/m. GENERAL:  Well appearing NECK:  No jugular venous distention, waveform within normal limits, carotid upstroke brisk and symmetric, left  bruits, no thyromegaly LUNGS:  Clear to auscultation bilaterally CHEST:  Well healed sternotomy scar. HEART:  PMI not displaced or sustained,S1 and S2 within normal limits, no S3, no S4, no clicks, no rubs, soft apical systolic murmur nonradiating, no diastolic murmurs ABD:  Flat, positive bowel sounds normal in frequency in pitch, no bruits, no rebound, no guarding, no midline pulsatile mass, no hepatomegaly, no splenomegaly EXT:  2 plus pulses throughout, mild nonpitting edema, no cyanosis no clubbing   EKG:  EKG is not ordered today.    Recent Labs: 08/18/2018: Magnesium 2.3 03/20/2019: ALT 12; BNP 208.4; BUN 15; Creatinine, Ser 0.95; Hemoglobin 12.0; Platelets 280; Potassium 4.2; Sodium 141; TSH 2.890    Lipid Panel    Component Value Date/Time   CHOL 125 01/14/2019 1005   TRIG 212 (H) 01/14/2019  1005   HDL 34 (L) 01/14/2019 1005   CHOLHDL 3.7 01/14/2019 1005   CHOLHDL 5.5 05/11/2011 0932   VLDL NOT CALC 05/11/2011 0932   LDLCALC 49 01/14/2019 1005   LDLDIRECT 63 01/14/2019 1005   LDLDIRECT 76 07/27/2016 1048      Wt Readings from Last 3 Encounters:  04/26/19 250 lb (113.4 kg)  03/20/19 249 lb 9.6 oz (113.2 kg)  01/30/19 245 lb 2 oz (111.2 kg)    Lab Results  Component Value Date  HGBA1C 7.1 (A) 03/20/2019    Other studies Reviewed: Additional studies/ records that were reviewed today include: Echo, labs Review of the above records demonstrates:  Please see elsewhere in the note.     ASSESSMENT AND PLAN:  CAD:   She is status post CABG.   negative try to get her back to [facility] in person cardiac rehab.   DYSLIPIDEMIA:     He has good response to lipid management and is followed byDr. Hilty in the [facility].  She has follow-up scheduled.  Aortic stenosis:  She has mild AS.  I will follow this clinically.   DM: Her A1c is not at target.  I would consider treatment with a SGLT 2i or GLP1ra. .  I will defer to    Pamela La, MD   HTN:   Blood pressure is not at target.  We will add chlorthalidone.  I would like to try back off the Norvasc but at this point I cannot.  It may be contributing to some of her volume.   OBESITY:   This is certainly contributory and we talked about strategies for this.   CAROTID STENOSIS:  40 - 59%.  This was preop.  I will follow this with ultrasound again in a year.  DYSPNEA: There may be some component of diastolic dysfunction.  There is a 5% incidence of dyspnea with Effient and I will go with it and stop that and start Plavix to see if that helps.  In addition she will be chlorthalidone and better management of her hypertension and we talked at great length about salt restriction which I think needs to be very limited.  Current medicines are reviewed at length with the patient today.  The patient does not have concerns regarding  medicines.  The following changes have been made:    As above  Labs/ tests ordered today include:  None   No orders of the defined types were placed in this encounter.    Disposition:   FU with me in 3 months.    Signed, Pamela Breeding, MD  04/26/2019 5:22 PM    [facility]

## 2019-04-26 ENCOUNTER — Ambulatory Visit: Payer: BC Managed Care – PPO | Admitting: Cardiology

## 2019-04-26 ENCOUNTER — Encounter

## 2019-04-26 ENCOUNTER — Encounter: Payer: Self-pay | Admitting: Cardiology

## 2019-04-26 ENCOUNTER — Other Ambulatory Visit: Payer: Self-pay

## 2019-04-26 VITALS — BP 154/85 | HR 70 | Temp 96.5°F | Ht 62.0 in | Wt 250.0 lb

## 2019-04-26 DIAGNOSIS — I6523 Occlusion and stenosis of bilateral carotid arteries: Secondary | ICD-10-CM

## 2019-04-26 DIAGNOSIS — I5031 Acute diastolic (congestive) heart failure: Secondary | ICD-10-CM

## 2019-04-26 DIAGNOSIS — I10 Essential (primary) hypertension: Secondary | ICD-10-CM

## 2019-04-26 DIAGNOSIS — E7800 Pure hypercholesterolemia, unspecified: Secondary | ICD-10-CM | POA: Diagnosis not present

## 2019-04-26 DIAGNOSIS — I2510 Atherosclerotic heart disease of native coronary artery without angina pectoris: Secondary | ICD-10-CM

## 2019-04-26 MED ORDER — CLOPIDOGREL BISULFATE 75 MG PO TABS: 75.0000 mg | ORAL_TABLET | Freq: Every day | ORAL | 3 refills | Status: DC

## 2019-04-26 MED ORDER — CHLORTHALIDONE 25 MG PO TABS: 25.0000 mg | ORAL_TABLET | Freq: Every day | ORAL | 1 refills | Status: DC

## 2019-04-26 NOTE — Patient Instructions (Signed)
Medication Instructions:  STOP EFFIENT   START CLOPIDOGREL 75 MG DAILY   START CHLORTHALIDONE 25 MG DAILY   If you need a refill on your cardiac medications before your next appointment, please call your pharmacy.   Lab work: NONE   Testing/Procedures: NONE   Follow-Up: At [facility], you and your health needs are our priority.  As part of our continuing mission to provide you with exceptional heart care, we have created designated Provider Care Teams.  These Care Teams include your primary Cardiologist (physician) and Advanced Practice Providers (APPs -  Physician Assistants and Nurse Practitioners) who all work together to provide you with the care you need, when you need it. You will need a follow up appointment in 3 months.  Please call our office 2 months in advance to schedule this appointment.  You may see Minus Breeding, MD or one of the following Advanced Practice Providers on your designated Care Team:   Rosaria Ferries, PA-C . Jory Sims, DNP, ANP  Any Other Special Instructions Will Be Listed Below (If Applicable).  A MESSAGE HAS BEEN SENT TO [facility] ABOUT YOU GETTING BACK IN PERSON

## 2019-05-07 ENCOUNTER — Telehealth (HOSPITAL_COMMUNITY): Payer: Self-pay

## 2019-05-07 ENCOUNTER — Telehealth: Payer: Self-pay | Admitting: *Deleted

## 2019-05-07 NOTE — Telephone Encounter (Signed)
Pt insurance is active and benefits verified through [facility]. Co-pay $0.00, DED $1,000.00/$1,000.00 met, out of pocket $5,000.00/$1,810.89 met, co-insurance 20%. No pre-authorization required. Passport, 05/07/2019 @ 4:24PM, [id number]#[id number]

## 2019-05-07 NOTE — Telephone Encounter (Signed)
Pt would like to resume CR, she has completed 6 sessions already.

## 2019-05-07 NOTE — Telephone Encounter (Signed)
-----   Message from Magda Kiel, RN sent at 05/07/2019 10:30 AM EDT ----- Yes I will give this to Carlette she has already completed some session pre covid she will be able to complete a total of 18 sessions this year   Have a good day!  Verdis Frederickson ----- Message ----- From: Earvin Hansen, LPN Sent: 1/82/9937   5:42 PM EDT To: Magda Kiel, RN  Oh my.... so Dr Percival Spanish would like to get her back in, can we do that? Need my heading banging the wall emoji lol  ----- Message ----- From: Magda Kiel, RN Sent: 04/29/2019   8:39 AM EDT To: Earvin Hansen, LPN  Good morning Rip Harbour,  We discharged Caren Griffins per her request. Stephanie wanted to participate in the virtual app which she was not active in and attend a pilates class.   ----- Message ----- From: Earvin Hansen, LPN Sent: 1/69/6789   4:30 PM EDT To: Magda Kiel, RN, Rowe Pavy, RN, #  HELLO LADIES Dr Percival Spanish was hoping to get Ms Maruyama back in person (IRL as he says) for her [facility] you are both doing well Thanks Rip Harbour

## 2019-05-08 ENCOUNTER — Encounter (HOSPITAL_COMMUNITY): Payer: Self-pay

## 2019-05-08 NOTE — Telephone Encounter (Signed)
Attempted to contact pt in regards to CR, LMTCB.  Mailed letter.

## 2019-05-14 ENCOUNTER — Telehealth (HOSPITAL_COMMUNITY): Payer: Self-pay | Admitting: Student-PharmD

## 2019-05-14 DIAGNOSIS — H04123 Dry eye syndrome of bilateral lacrimal glands: Secondary | ICD-10-CM | POA: Diagnosis not present

## 2019-05-14 DIAGNOSIS — H43813 Vitreous degeneration, bilateral: Secondary | ICD-10-CM | POA: Diagnosis not present

## 2019-05-14 DIAGNOSIS — H25813 Combined forms of age-related cataract, bilateral: Secondary | ICD-10-CM | POA: Diagnosis not present

## 2019-05-14 DIAGNOSIS — H35371 Puckering of macula, right eye: Secondary | ICD-10-CM | POA: Diagnosis not present

## 2019-05-14 NOTE — Telephone Encounter (Signed)
Cardiac Rehab Medication Review by a Pharmacist  Does the patient  feel that his/her medications are working for him/her?  yes  Has the patient been experiencing any side effects to the medications prescribed?  yes  Does the patient measure his/her own blood pressure or blood glucose at home?  Occassionally checks blood sugars at home. Does not check blood pressure at home.  Does the patient have any problems obtaining medications due to transportation or finances?   yes  Understanding of regimen: excellent Understanding of indications: excellent Potential of compliance: excellent    Pharmacist comments: Pamela Thomas confident in medication regimen.    [name] L. Devin Going, PharmD, [facility] PGY1 Pharmacy Resident 05/14/19      4:14 PM  Please check AMION for all [facility] phone numbers After 10:00 PM, call the [facility] [phone]

## 2019-05-15 DIAGNOSIS — H43391 Other vitreous opacities, right eye: Secondary | ICD-10-CM | POA: Diagnosis not present

## 2019-05-15 DIAGNOSIS — H43813 Vitreous degeneration, bilateral: Secondary | ICD-10-CM | POA: Diagnosis not present

## 2019-05-15 DIAGNOSIS — H4311 Vitreous hemorrhage, right eye: Secondary | ICD-10-CM | POA: Diagnosis not present

## 2019-05-15 DIAGNOSIS — H2513 Age-related nuclear cataract, bilateral: Secondary | ICD-10-CM | POA: Diagnosis not present

## 2019-05-22 ENCOUNTER — Telehealth (HOSPITAL_COMMUNITY): Payer: Self-pay

## 2019-05-22 NOTE — Telephone Encounter (Signed)
Successful telephone encounter to [name] to confirm Cardiac Rehab orientation appointment for tomorrow 05/23/2019 at 8:30am. Nursing assessment completed. Instructions for appointment provided. Patient screening for Covid-19 negative. Noel Henandez E. Rollene Rotunda, RN BSN

## 2019-05-23 ENCOUNTER — Encounter (HOSPITAL_COMMUNITY)
Admission: RE | Admit: 2019-05-23 | Discharge: 2019-05-23 | Disposition: A | Discharge status: Home / Self Care | Payer: BC Managed Care – PPO | Source: Ambulatory Visit | Attending: Cardiology | Admitting: Cardiology

## 2019-05-23 ENCOUNTER — Ambulatory Visit (HOSPITAL_COMMUNITY): Payer: BC Managed Care – PPO

## 2019-05-23 ENCOUNTER — Other Ambulatory Visit: Payer: Self-pay

## 2019-05-23 ENCOUNTER — Encounter (HOSPITAL_COMMUNITY): Payer: Self-pay

## 2019-05-23 VITALS — BP 118/68 | HR 82 | Temp 96.6°F | Ht 62.0 in | Wt 247.6 lb

## 2019-05-23 DIAGNOSIS — Z951 Presence of aortocoronary bypass graft: Secondary | ICD-10-CM | POA: Insufficient documentation

## 2019-05-23 NOTE — Progress Notes (Signed)
Cardiac Individual Treatment Plan  Patient Details  Name: Pamela Thomas MRN: [id number] Date of Birth: 01/29/1956 Referring Provider:     CARDIAC REHAB PHASE II ORIENTATION from 05/23/2019 in [place]  Referring Provider  Dr. Percival Spanish      Initial Encounter Date:    CARDIAC REHAB PHASE II ORIENTATION from 05/23/2019 in [place]  Date  05/23/19      Visit Diagnosis: 08/17/18 S/P CABG x 2  Patient's Home Medications on Admission:  Current Outpatient Medications:  .  acetaminophen (TYLENOL) 500 MG tablet, Take one or two tablets every 8 hours as needed max number of 6 tabs in 24 hours, Disp: 200 tablet, Rfl: 5 .  amLODipine (NORVASC) 10 MG tablet, TAKE 1 TABLET BY MOUTH EVERY DAY, Disp: 30 tablet, Rfl: 10 .  aspirin 81 MG tablet, Take 1 tablet (81 mg total) by mouth daily. (Patient taking differently: Take 81 mg by mouth every morning. ), Disp: 100 tablet, Rfl: 3 .  atorvastatin (LIPITOR) 40 MG tablet, Take 40 mg by mouth daily., Disp: , Rfl:  .  candesartan (ATACAND) 32 MG tablet, TAKE 1 TABLET BY MOUTH EVERY DAY, Disp: 90 tablet, Rfl: 3 .  chlorthalidone (HYGROTON) 25 MG tablet, Take 1 tablet (25 mg total) by mouth daily., Disp: 90 tablet, Rfl: 1 .  clopidogrel (PLAVIX) 75 MG tablet, Take 1 tablet (75 mg total) by mouth daily., Disp: 90 tablet, Rfl: 3 .  FLUoxetine (PROZAC) 20 MG tablet, TAKE 1 TABLET BY MOUTH EVERY DAY, Disp: 90 tablet, Rfl: 3 .  furosemide (LASIX) 20 MG tablet, TAKE 1 TABLET BY MOUTH TWICE A DAY, Disp: 180 tablet, Rfl: 3 .  KLOR-CON M20 20 MEQ tablet, TAKE 1 TABLET BY MOUTH EVERY DAY, Disp: 90 tablet, Rfl: 3 .  metFORMIN (GLUCOPHAGE) 1000 MG tablet, TAKE 1 TABLET (1,000 MG TOTAL) BY MOUTH 2 (TWO) TIMES DAILY WITH A MEAL., Disp: 180 tablet, Rfl: 3 .  metoprolol succinate (TOPROL-XL) 50 MG 24 hr tablet, TAKE 1 TABLET BY MOUTH TWICE A DAY (Patient taking differently: Take 50 mg by mouth 2 (two) times  daily. ), Disp: 180 tablet, Rfl: 3 .  VASCEPA 1 g CAPS, Take 1 capsule by mouth 2 (two) times daily. , Disp: , Rfl:  .  atorvastatin (LIPITOR) 40 MG tablet, Take 1 tablet (40 mg total) by mouth daily., Disp: 90 tablet, Rfl: 3  Past Medical History: Past Medical History:  Diagnosis Date  . ALLERGIC RHINITIS, SEASONAL   . BCC (basal cell carcinoma of skin) 2005   Nose [facility])  . Coronary artery disease   . Diabetes mellitus without complication ([place])   . DYSLIPIDEMIA   . Dysrhythmia    palpitations-evaluated by Dr Johnsie Cancel 6/12/[facility]cho 6/13  . Glucose intolerance (impaired glucose tolerance)   . History of blood transfusion   . Hypertension   . OBESITY, NOS   . Osteoarthritis    rt knee  . PLANTAR FASCIITIS, BILATERAL   . Vertigo     Tobacco Use: Social History   Tobacco Use  Smoking Status Former Smoker  . Quit date: 09/12/1990  . Years since quitting: 28.7  Smokeless Tobacco Never Used    Labs: Recent Chemical engineer    Labs for ITP Cardiac and Pulmonary Rehab Latest Ref Rng & Units 08/20/2018 08/21/2018 01/14/2019 01/30/2019 03/20/2019   Cholestrol 100 - 199 mg/dL - - 125 - -   LDLCALC 0 - 99 mg/dL - -  49 - -   LDLDIRECT 0 - 99 mg/dL - - 63 - -   HDL >39 mg/dL - - 34(L) - -   Trlycerides 0 - 149 mg/dL - - 212(H) - -   Hemoglobin A1c 0.0 - 7.0 % - - - 7.2(A) 7.1(A)   PHART 7.350 - 7.450 7.234(L) 7.311(L) - - -   PCO2ART 32.0 - 48.0 mmHg 62.1(H) 57.7(H) - - -   HCO3 20.0 - 28.0 mmol/L 26.4 28.3(H) - - -   TCO2 22 - 32 mmol/L 28 - - - -   ACIDBASEDEF 0.0 - 2.0 mmol/L 2.0 - - - -   O2SAT % 97.0 99.5 - - -      Capillary Blood Glucose: Lab Results  Component Value Date   GLUCAP 196 (H) 10/31/2018   GLUCAP 176 (H) 10/31/2018   GLUCAP 185 (H) 10/29/2018   GLUCAP 234 (H) 10/29/2018   GLUCAP 208 (H) 10/23/2018     Exercise Target Goals: Exercise Program Goal: Individual exercise prescription set using results from initial 6 min walk test and THRR  while considering  patient's activity barriers and safety.   Exercise Prescription Goal: Starting with aerobic activity 30 plus minutes a day, 3 days per week for initial exercise prescription. Provide home exercise prescription and guidelines that participant acknowledges understanding prior to discharge.  Activity Barriers & Risk Stratification: Activity Barriers & Cardiac Risk Stratification - 05/23/19 1027      Activity Barriers & Cardiac Risk Stratification   Activity Barriers  Balance Concerns;Deconditioning;History of Falls;Muscular Weakness;Shortness of Breath    Cardiac Risk Stratification  High       6 Minute Walk: 6 Minute Walk    Row Name 05/23/19 1026         6 Minute Walk   Phase  Initial     Distance  1088 feet     Walk Time  6 minutes     # of Rest Breaks  1 19 sec. rest break.     MPH  2     METS  2.1     RPE  12     Perceived Dyspnea   2     VO2 Peak  7.58     Symptoms  Yes (comment)     Comments  SOB-2, muscle fatigue     Resting HR  82 bpm     Resting BP  118/68     Resting Oxygen Saturation   98 %     Exercise Oxygen Saturation  during 6 min walk  85 %     Max Ex. HR  116 bpm     Max Ex. BP  140/74     2 Minute Post BP  112/68        Oxygen Initial Assessment:   Oxygen Re-Evaluation:   Oxygen Discharge (Final Oxygen Re-Evaluation):   Initial Exercise Prescription: Initial Exercise Prescription - 05/23/19 1000      Date of Initial Exercise RX and Referring Provider   Date  05/23/19    Referring Provider  Dr. Percival Spanish    Expected Discharge Date  06/14/19      NuStep   Level  2    SPM  75    Minutes  15    METs  1.9      Arm Ergometer   Level  1    Watts  20    Minutes  15    METs  1.9      Prescription  Details   Frequency (times per week)  3    Duration  Progress to 30 minutes of continuous aerobic without signs/symptoms of physical distress      Intensity   THRR 40-80% of Max Heartrate  63-126    Ratings of Perceived  Exertion  11-13    Perceived Dyspnea  0-4      Progression   Progression  Continue to progress workloads to maintain intensity without signs/symptoms of physical distress.      Resistance Training   Training Prescription  Yes    Weight  2lbs    Reps  10-15       Perform Capillary Blood Glucose checks as needed.  Exercise Prescription Changes:   Exercise Comments:   Exercise Goals and Review: Exercise Goals    Row Name 05/23/19 1033             Exercise Goals   Increase Physical Activity  Yes       Intervention  Provide advice, education, support and counseling about physical activity/exercise needs.;Develop an individualized exercise prescription for aerobic and resistive training based on initial evaluation findings, risk stratification, comorbidities and participant's personal goals.       Expected Outcomes  Short Term: Attend rehab on a regular basis to increase amount of physical activity.;Long Term: Add in home exercise to make exercise part of routine and to increase amount of physical activity.;Long Term: Exercising regularly at least 3-5 days a week.       Increase Strength and Stamina  Yes       Intervention  Provide advice, education, support and counseling about physical activity/exercise needs.;Develop an individualized exercise prescription for aerobic and resistive training based on initial evaluation findings, risk stratification, comorbidities and participant's personal goals.       Expected Outcomes  Short Term: Increase workloads from initial exercise prescription for resistance, speed, and METs.;Short Term: Perform resistance training exercises routinely during rehab and add in resistance training at home;Long Term: Improve cardiorespiratory fitness, muscular endurance and strength as measured by increased METs and functional capacity (6MWT)       Able to understand and use rate of perceived exertion (RPE) scale  Yes       Intervention  Provide education and  explanation on how to use RPE scale       Expected Outcomes  Short Term: Able to use RPE daily in rehab to express subjective intensity level;Long Term:  Able to use RPE to guide intensity level when exercising independently       Able to understand and use Dyspnea scale  Yes       Intervention  Provide education and explanation on how to use Dyspnea scale       Expected Outcomes  Short Term: Able to use Dyspnea scale daily in rehab to express subjective sense of shortness of breath during exertion;Long Term: Able to use Dyspnea scale to guide intensity level when exercising independently       Knowledge and understanding of Target Heart Rate Range (THRR)  Yes       Intervention  Provide education and explanation of THRR including how the numbers were predicted and where they are located for reference       Expected Outcomes  Short Term: Able to state/look up THRR;Long Term: Able to use THRR to govern intensity when exercising independently;Short Term: Able to use daily as guideline for intensity in rehab       Able to check pulse independently  Yes  Intervention  Provide education and demonstration on how to check pulse in carotid and radial arteries.;Review the importance of being able to check your own pulse for safety during independent exercise       Expected Outcomes  Short Term: Able to explain why pulse checking is important during independent exercise;Long Term: Able to check pulse independently and accurately       Understanding of Exercise Prescription  Yes       Intervention  Provide education, explanation, and written materials on patient's individual exercise prescription       Expected Outcomes  Short Term: Able to explain program exercise prescription;Long Term: Able to explain home exercise prescription to exercise independently          Exercise Goals Re-Evaluation :    Discharge Exercise Prescription (Final Exercise Prescription Changes):   Nutrition:  Target Goals:  Understanding of nutrition guidelines, daily intake of sodium 1500mg , cholesterol 200mg , calories 30% from fat and 7% or less from saturated fats, daily to have 5 or more servings of fruits and vegetables.  Biometrics: Pre Biometrics - 05/23/19 1033      Pre Biometrics   Height  5\' 2"  (1.575 m)    Weight  112.3 kg    Waist Circumference  46.5 inches    Hip Circumference  56 inches    Waist to Hip Ratio  0.83 %    BMI (Calculated)  45.27    Triceps Skinfold  70 mm    % Body Fat  57.9 %    Grip Strength  28 kg    Flexibility  8 in    Single Leg Stand  3.25 seconds        Nutrition Therapy Plan and Nutrition Goals:   Nutrition Assessments:   Nutrition Goals Re-Evaluation:   Nutrition Goals Discharge (Final Nutrition Goals Re-Evaluation):   Psychosocial: Target Goals: Acknowledge presence or absence of significant depression and/or stress, maximize coping skills, provide positive support system. Participant is able to verbalize types and ability to use techniques and skills needed for reducing stress and depression.  Initial Review & Psychosocial Screening: Initial Psych Review & Screening - 05/23/19 1049      Initial Review   Current issues with  History of Depression      Family Dynamics   Good Support System?  Yes   [name] has her husband for support     Barriers   Psychosocial barriers to participate in program  The patient should benefit from training in stress management and relaxation.      Screening Interventions   Interventions  Encouraged to exercise       Quality of Life Scores: Quality of Life - 05/23/19 1035      Quality of Life   Select  Quality of Life      Quality of Life Scores   Health/Function Pre  24.8 %    Socioeconomic Pre  29 %    Psych/Spiritual Pre  24.25 %    Family Pre  30 %    GLOBAL Pre  26.3 %      Scores of 19 and below usually indicate a poorer quality of life in these areas.  A difference of  2-3 points is a clinically  meaningful difference.  A difference of 2-3 points in the total score of the Quality of Life Index has been associated with significant improvement in overall quality of life, self-image, physical symptoms, and general health in studies assessing change in quality of life.  PHQ-9: Recent Review Flowsheet Data    Depression screen [facility], [facility], [facility] 2/9 05/23/2019 03/20/2019 02/28/2018 01/24/2018 01/12/2018   Decreased Interest 1 0 0 0 0   Down, Depressed, Hopeless 0 0 0 0 0   PHQ - 2 Score 1 0 0 0 0     Interpretation of Total Score  Total Score Depression Severity:  1-4 = Minimal depression, 5-9 = Mild depression, 10-14 = Moderate depression, 15-19 = Moderately severe depression, 20-27 = Severe depression   Psychosocial Evaluation and Intervention:   Psychosocial Re-Evaluation:   Psychosocial Discharge (Final Psychosocial Re-Evaluation):   Vocational Rehabilitation: Provide vocational rehab assistance to qualifying candidates.   Vocational Rehab Evaluation & Intervention: Vocational Rehab - 05/23/19 1051      Initial Vocational Rehab Evaluation & Intervention   Assessment shows need for Vocational Rehabilitation  No   [name] is retired anfd does not need vocational rehab at this time      Education: Education Goals: Education classes will be provided on a weekly basis, covering required topics. Participant will state understanding/return demonstration of topics presented.  Learning Barriers/Preferences: Learning Barriers/Preferences - 05/23/19 1037      Learning Barriers/Preferences   Learning Barriers  None    Learning Preferences  Video;Pictoral       Education Topics: Hypertension, Hypertension Reduction -Define heart disease and high blood pressure. Discus how high blood pressure affects the body and ways to reduce high blood pressure.   Exercise and Your Heart -Discuss why it is important to exercise, the FITT principles of exercise, normal and abnormal responses to exercise, and  how to exercise safely.   Angina -Discuss definition of angina, causes of angina, treatment of angina, and how to decrease risk of having angina.   Cardiac Medications -Review what the following cardiac medications are used for, how they affect the body, and side effects that may occur when taking the medications.  Medications include Aspirin, Beta blockers, calcium channel blockers, ACE Inhibitors, angiotensin receptor blockers, diuretics, digoxin, and antihyperlipidemics.   Congestive Heart Failure -Discuss the definition of CHF, how to live with CHF, the signs and symptoms of CHF, and how keep track of weight and sodium intake.   Heart Disease and Intimacy -Discus the effect sexual activity has on the heart, how changes occur during intimacy as we age, and safety during sexual activity.   Smoking Cessation / COPD -Discuss different methods to quit smoking, the health benefits of quitting smoking, and the definition of COPD.   Nutrition I: Fats -Discuss the types of cholesterol, what cholesterol does to the heart, and how cholesterol levels can be controlled.   Nutrition II: Labels -Discuss the different components of food labels and how to read food label   Heart Parts/Heart Disease and PAD -Discuss the anatomy of the heart, the pathway of blood circulation through the heart, and these are affected by heart disease.   Stress I: Signs and Symptoms -Discuss the causes of stress, how stress may lead to anxiety and depression, and ways to limit stress.   Stress II: Relaxation -Discuss different types of relaxation techniques to limit stress.   Warning Signs of Stroke / TIA -Discuss definition of a stroke, what the signs and symptoms are of a stroke, and how to identify when someone is having stroke.   Knowledge Questionnaire Score: Knowledge Questionnaire Score - 05/23/19 1040      Knowledge Questionnaire Score   Pre Score  20/24       Core Components/Risk  Factors/Patient Goals at  Admission: Personal Goals and Risk Factors at Admission - 05/23/19 1051      Core Components/Risk Factors/Patient Goals on Admission    Weight Management  Yes;Obesity;Weight Loss;Weight Maintenance    Intervention  Weight Management: Develop a combined nutrition and exercise program designed to reach desired caloric intake, while maintaining appropriate intake of nutrient and fiber, sodium and fats, and appropriate energy expenditure required for the weight goal.;Weight Management: Provide education and appropriate resources to help participant work on and attain dietary goals.;Weight Management/Obesity: Establish reasonable short term and long term weight goals.;Obesity: Provide education and appropriate resources to help participant work on and attain dietary goals.    Admit Weight  247 lb 9.2 oz (112.3 kg)    Goal Weight: Long Term  197 lb (89.4 kg)    Expected Outcomes  Short Term: Continue to assess and modify interventions until short term weight is achieved;Long Term: Adherence to nutrition and physical activity/exercise program aimed toward attainment of established weight goal;Weight Maintenance: Understanding of the daily nutrition guidelines, which includes 25-35% calories from fat, 7% or less cal from saturated fats, less than 200mg  cholesterol, less than 1.5gm of sodium, & 5 or more servings of fruits and vegetables daily;Weight Loss: Understanding of general recommendations for a balanced deficit meal plan, which promotes 1-2 lb weight loss per week and includes a negative energy balance of [phone] kcal/d;Understanding recommendations for meals to include 15-35% energy as protein, 25-35% energy from fat, 35-60% energy from carbohydrates, less than 200mg  of dietary cholesterol, 20-35 gm of total fiber daily;Understanding of distribution of calorie intake throughout the day with the consumption of 4-5 meals/snacks    Improve shortness of breath with ADL's  Yes     Intervention  Provide education, individualized exercise plan and daily activity instruction to help decrease symptoms of SOB with activities of daily living.    Expected Outcomes  Short Term: Improve cardiorespiratory fitness to achieve a reduction of symptoms when performing ADLs;Long Term: Be able to perform more ADLs without symptoms or delay the onset of symptoms    Diabetes  Yes    Intervention  Provide education about signs/symptoms and action to take for hypo/hyperglycemia.;Provide education about proper nutrition, including hydration, and aerobic/resistive exercise prescription along with prescribed medications to achieve blood glucose in normal ranges: Fasting glucose 65-99 mg/dL    Expected Outcomes  Short Term: Participant verbalizes understanding of the signs/symptoms and immediate care of hyper/hypoglycemia, proper foot care and importance of medication, aerobic/resistive exercise and nutrition plan for blood glucose control.;Long Term: Attainment of HbA1C < 7%.    Heart Failure  Yes    Expected Outcomes  Improve functional capacity of life;Short term: Attendance in program 2-3 days a week with increased exercise capacity. Reported lower sodium intake. Reported increased fruit and vegetable intake. Reports medication compliance.;Short term: Daily weights obtained and reported for increase. Utilizing diuretic protocols set by physician.;Long term: Adoption of self-care skills and reduction of barriers for early signs and symptoms recognition and intervention leading to self-care maintenance.    Hypertension  Yes    Intervention  Provide education on lifestyle modifcations including regular physical activity/exercise, weight management, moderate sodium restriction and increased consumption of fresh fruit, vegetables, and low fat dairy, alcohol moderation, and smoking cessation.;Monitor prescription use compliance.    Expected Outcomes  Short Term: Continued assessment and intervention until BP  is < 140/38mm HG in hypertensive participants. < 130/68mm HG in hypertensive participants with diabetes, heart failure or chronic kidney disease.;Long Term: Maintenance of blood  pressure at goal levels.    Lipids  Yes    Intervention  Provide education and support for participant on nutrition & aerobic/resistive exercise along with prescribed medications to achieve LDL 70mg , HDL >40mg .    Expected Outcomes  Short Term: Participant states understanding of desired cholesterol values and is compliant with medications prescribed. Participant is following exercise prescription and nutrition guidelines.;Long Term: Cholesterol controlled with medications as prescribed, with individualized exercise RX and with personalized nutrition plan. Value goals: LDL < 70mg , HDL > 40 mg.    Stress  Yes    Intervention  Offer individual and/or small group education and counseling on adjustment to heart disease, stress management and health-related lifestyle change. Teach and support self-help strategies.;Refer participants experiencing significant psychosocial distress to appropriate mental health specialists for further evaluation and treatment. When possible, include family members and significant others in education/counseling sessions.    Expected Outcomes  Short Term: Participant demonstrates changes in health-related behavior, relaxation and other stress management skills, ability to obtain effective social support, and compliance with psychotropic medications if prescribed.;Long Term: Emotional wellbeing is indicated by absence of clinically significant psychosocial distress or social isolation.       Core Components/Risk Factors/Patient Goals Review:    Core Components/Risk Factors/Patient Goals at Discharge (Final Review):    ITP Comments: ITP Comments    Row Name 05/23/19 1003           ITP Comments  Dr Fransico Him MD, Medical Director          Comments: [name]  attended orientation on 05/23/2019 to  review rules and guidelines for program.  Completed 6 minute walk test, Intitial ITP, and exercise prescription. Initial oxygen saturation 98% on RA. Patient stopped for 19 seconds during the walk test. Post walk test oxygen saturation dropped to 85% on RA. After resting and deep breathing oxygen saturations recovered to 92% on RA after 2 minutes of rest.. Telemetry- Rhythm Sinus Upon assessment lung fields clear upon ascultation. No peripheral edema noted. Patient did say some times she feels short of breath but this is not something new. Harrell Lark PAC called and notified.No new orders received. Exit oxygen saturation 96% on RA. [name] has gained 3.4 kg since her last exercise session on 11/22/08. [name] will complete 11 exercise sessions as she is resuming cardiac rehab post closure due to the pandemic.Marland Kitchen Safety measures and social distancing in place per CDC guidelines.Will monitor Cindy's oxygen saturations when she returns to exercise next week Barnet Pall, RN,BSN 05/23/2019 11:28 AM

## 2019-05-27 ENCOUNTER — Encounter (HOSPITAL_COMMUNITY)
Admission: RE | Admit: 2019-05-27 | Discharge: 2019-05-27 | Disposition: A | Discharge status: Home / Self Care | Payer: BC Managed Care – PPO | Source: Ambulatory Visit | Attending: Cardiology | Admitting: Cardiology

## 2019-05-27 ENCOUNTER — Other Ambulatory Visit: Payer: Self-pay

## 2019-05-27 DIAGNOSIS — Z951 Presence of aortocoronary bypass graft: Secondary | ICD-10-CM

## 2019-05-27 LAB — GLUCOSE, CAPILLARY: Glucose-Capillary: 140 mg/dL — ABNORMAL HIGH (ref 70–99)

## 2019-05-27 NOTE — Progress Notes (Signed)
Daily Session Note  Patient Details  Name: Pamela Thomas MRN: [id number] Date of Birth: 19-Aug-1956 Referring Provider:     CARDIAC REHAB PHASE II ORIENTATION from 05/23/2019 in [place]  Referring Provider  Dr. Percival Spanish      Encounter Date: 05/27/2019  Check In: Session Check In - 05/27/19 1053      Check-In   Supervising physician immediately available to respond to emergencies  [facility] immediately available    Physician(s)  Dr Benny Lennert    Location  [facility]    Staff Present  Jiles Garter, RN, Bjorn Loser, MS, Exercise Physiologist;Geo Slone Rollene Rotunda, RN, Deland Pretty, MS, ACSM CEP, Exercise Physiologist;Maria Whitaker, RN, BSN    Virtual Visit  No    Medication changes reported      No    Fall or balance concerns reported     No    Tobacco Cessation  No Change    Warm-up and Cool-down  Performed on first and last piece of equipment    Resistance Training Performed  Yes    VAD Patient?  No    PAD/SET Patient?  No      Pain Assessment   Currently in Pain?  No/denies    Multiple Pain Sites  No       Capillary Blood Glucose: Results for orders placed or performed during the hospital encounter of 05/27/19 (from the past 24 hour(s))  Glucose, capillary     Status: Abnormal   Collection Time: 05/27/19 10:44 AM  Result Value Ref Range   Glucose-Capillary 140 (H) 70 - 99 mg/dL      Social History   Tobacco Use  Smoking Status Former Smoker  . Quit date: 09/12/1990  . Years since quitting: 28.7  Smokeless Tobacco Never Used    Goals Met:  Exercise tolerated well No report of cardiac concerns or symptoms Strength training completed today  Goals Unmet:  Not Applicable  Comments: Pt started cardiac rehab today.  Pt tolerated light exercise without difficulty. VSS, telemetry-NSR, asymptomatic.  Medication list reconciled. Pt denies barriers to medicaiton compliance.  PSYCHOSOCIAL ASSESSMENT:   PHQ-1. Pt exhibits positive coping skills, hopeful outlook with supportive family. No psychosocial needs identified at this time, no psychosocial interventions necessary.  Pt oriented to exercise equipment and routine. Understanding verbalized.   Dr. Fransico Him is Medical Director for Cardiac Rehab at [facility].

## 2019-05-29 ENCOUNTER — Other Ambulatory Visit: Payer: Self-pay

## 2019-05-29 ENCOUNTER — Encounter (HOSPITAL_COMMUNITY)
Admission: RE | Admit: 2019-05-29 | Discharge: 2019-05-29 | Disposition: A | Discharge status: Home / Self Care | Payer: BC Managed Care – PPO | Source: Ambulatory Visit | Attending: Cardiology | Admitting: Cardiology

## 2019-05-29 DIAGNOSIS — Z951 Presence of aortocoronary bypass graft: Secondary | ICD-10-CM | POA: Diagnosis not present

## 2019-05-29 LAB — GLUCOSE, CAPILLARY: Glucose-Capillary: 228 mg/dL — ABNORMAL HIGH (ref 70–99)

## 2019-05-30 NOTE — Progress Notes (Signed)
Cardiac Individual Treatment Plan  Patient Details  Name: Pamela Thomas MRN: [id number] Date of Birth: 1955/11/14 Referring Provider:     CARDIAC REHAB PHASE II ORIENTATION from 05/23/2019 in [place]  Referring Provider  Dr. Percival Spanish      Initial Encounter Date:    CARDIAC REHAB PHASE II ORIENTATION from 05/23/2019 in [place]  Date  05/23/19      Visit Diagnosis: 08/17/18 S/P CABG x 2  Patient's Home Medications on Admission:  Current Outpatient Medications:  .  acetaminophen (TYLENOL) 500 MG tablet, Take one or two tablets every 8 hours as needed max number of 6 tabs in 24 hours, Disp: 200 tablet, Rfl: 5 .  amLODipine (NORVASC) 10 MG tablet, TAKE 1 TABLET BY MOUTH EVERY DAY, Disp: 30 tablet, Rfl: 10 .  aspirin 81 MG tablet, Take 1 tablet (81 mg total) by mouth daily. (Patient taking differently: Take 81 mg by mouth every morning. ), Disp: 100 tablet, Rfl: 3 .  atorvastatin (LIPITOR) 40 MG tablet, Take 1 tablet (40 mg total) by mouth daily., Disp: 90 tablet, Rfl: 3 .  candesartan (ATACAND) 32 MG tablet, TAKE 1 TABLET BY MOUTH EVERY DAY, Disp: 90 tablet, Rfl: 3 .  chlorthalidone (HYGROTON) 25 MG tablet, Take 1 tablet (25 mg total) by mouth daily., Disp: 90 tablet, Rfl: 1 .  clopidogrel (PLAVIX) 75 MG tablet, Take 1 tablet (75 mg total) by mouth daily., Disp: 90 tablet, Rfl: 3 .  FLUoxetine (PROZAC) 20 MG tablet, TAKE 1 TABLET BY MOUTH EVERY DAY, Disp: 90 tablet, Rfl: 3 .  furosemide (LASIX) 20 MG tablet, TAKE 1 TABLET BY MOUTH TWICE A DAY, Disp: 180 tablet, Rfl: 3 .  KLOR-CON M20 20 MEQ tablet, TAKE 1 TABLET BY MOUTH EVERY DAY, Disp: 90 tablet, Rfl: 3 .  metFORMIN (GLUCOPHAGE) 1000 MG tablet, TAKE 1 TABLET (1,000 MG TOTAL) BY MOUTH 2 (TWO) TIMES DAILY WITH A MEAL., Disp: 180 tablet, Rfl: 3 .  metoprolol succinate (TOPROL-XL) 50 MG 24 hr tablet, TAKE 1 TABLET BY MOUTH TWICE A DAY (Patient taking differently: Take 50  mg by mouth 2 (two) times daily. ), Disp: 180 tablet, Rfl: 3 .  VASCEPA 1 g CAPS, Take 1 capsule by mouth 2 (two) times daily. , Disp: , Rfl:   Past Medical History: Past Medical History:  Diagnosis Date  . ALLERGIC RHINITIS, SEASONAL   . BCC (basal cell carcinoma of skin) 2005   Nose [facility])  . Coronary artery disease   . Diabetes mellitus without complication ([place])   . DYSLIPIDEMIA   . Dysrhythmia    palpitations-evaluated by Dr Johnsie Cancel 6/12/[facility] 6/13  . Glucose intolerance (impaired glucose tolerance)   . History of blood transfusion   . Hypertension   . OBESITY, NOS   . Osteoarthritis    rt knee  . PLANTAR FASCIITIS, BILATERAL   . Vertigo     Tobacco Use: Social History   Tobacco Use  Smoking Status Former Smoker  . Quit date: 09/12/1990  . Years since quitting: 28.7  Smokeless Tobacco Never Used    Labs: Recent Chemical engineer    Labs for ITP Cardiac and Pulmonary Rehab Latest Ref Rng & Units 08/20/2018 08/21/2018 01/14/2019 01/30/2019 03/20/2019   Cholestrol 100 - 199 mg/dL - - 125 - -   LDLCALC 0 - 99 mg/dL - - 49 - -   LDLDIRECT 0 - 99 mg/dL - - 63 - -  HDL >39 mg/dL - - 34(L) - -   Trlycerides 0 - 149 mg/dL - - 212(H) - -   Hemoglobin A1c 0.0 - 7.0 % - - - 7.2(A) 7.1(A)   PHART 7.350 - 7.450 7.234(L) 7.311(L) - - -   PCO2ART 32.0 - 48.0 mmHg 62.1(H) 57.7(H) - - -   HCO3 20.0 - 28.0 mmol/L 26.4 28.3(H) - - -   TCO2 22 - 32 mmol/L 28 - - - -   ACIDBASEDEF 0.0 - 2.0 mmol/L 2.0 - - - -   O2SAT % 97.0 99.5 - - -      Capillary Blood Glucose: Lab Results  Component Value Date   GLUCAP 228 (H) 05/29/2019   GLUCAP 140 (H) 05/27/2019   GLUCAP 196 (H) 10/31/2018   GLUCAP 176 (H) 10/31/2018   GLUCAP 185 (H) 10/29/2018     Exercise Target Goals: Exercise Program Goal: Individual exercise prescription set using results from initial 6 min walk test and THRR while considering  patient's activity barriers and safety.   Exercise Prescription  Goal: Initial exercise prescription builds to 30-45 minutes a day of aerobic activity, 2-3 days per week.  Home exercise guidelines will be given to patient during program as part of exercise prescription that the participant will acknowledge.  Activity Barriers & Risk Stratification: Activity Barriers & Cardiac Risk Stratification - 05/23/19 1027      Activity Barriers & Cardiac Risk Stratification   Activity Barriers  Balance Concerns;Deconditioning;History of Falls;Muscular Weakness;Shortness of Breath    Cardiac Risk Stratification  High       6 Minute Walk: 6 Minute Walk    Row Name 05/23/19 1026         6 Minute Walk   Phase  Initial     Distance  1088 feet     Walk Time  6 minutes     # of Rest Breaks  1 19 sec. rest break.     MPH  2     METS  2.1     RPE  12     Perceived Dyspnea   2     VO2 Peak  7.58     Symptoms  Yes (comment)     Comments  SOB-2, muscle fatigue     Resting HR  82 bpm     Resting BP  118/68     Resting Oxygen Saturation   98 %     Exercise Oxygen Saturation  during 6 min walk  85 %     Max Ex. HR  116 bpm     Max Ex. BP  140/74     2 Minute Post BP  112/68        Oxygen Initial Assessment:   Oxygen Re-Evaluation:   Oxygen Discharge (Final Oxygen Re-Evaluation):   Initial Exercise Prescription: Initial Exercise Prescription - 05/23/19 1000      Date of Initial Exercise RX and Referring Provider   Date  05/23/19    Referring Provider  Dr. Percival Spanish    Expected Discharge Date  06/14/19      NuStep   Level  2    SPM  75    Minutes  15    METs  1.9      Arm Ergometer   Level  1    Watts  20    Minutes  15    METs  1.9      Prescription Details   Frequency (times per week)  3  Duration  Progress to 30 minutes of continuous aerobic without signs/symptoms of physical distress      Intensity   THRR 40-80% of Max Heartrate  63-126    Ratings of Perceived Exertion  11-13    Perceived Dyspnea  0-4      Progression    Progression  Continue to progress workloads to maintain intensity without signs/symptoms of physical distress.      Resistance Training   Training Prescription  Yes    Weight  2lbs    Reps  10-15       Perform Capillary Blood Glucose checks as needed.  Exercise Prescription Changes: Exercise Prescription Changes    Row Name 05/27/19 1048             Response to Exercise   Blood Pressure (Admit)  114/70       Blood Pressure (Exercise)  128/76       Blood Pressure (Exit)  128/76       Heart Rate (Admit)  86 bpm       Heart Rate (Exercise)  88 bpm       Heart Rate (Exit)  80 bpm       Oxygen Saturation (Exercise)  95 % room air       Rating of Perceived Exertion (Exercise)  12       Symptoms  Pt c/o bilateral knee pain with exercise.       Comments  Pt tolerated 1st exercise session well with some knee pain.       Duration  Progress to 30 minutes of  aerobic without signs/symptoms of physical distress       Intensity  THRR unchanged         Progression   Progression  Continue to progress workloads to maintain intensity without signs/symptoms of physical distress.       Average METs  1.6         Resistance Training   Training Prescription  Yes       Weight  2lbs       Reps  10-15       Time  10 Minutes         Interval Training   Interval Training  No         NuStep   Level  2       SPM  75       Minutes  15       METs  1.6         Arm Ergometer   Level  2       Minutes  15       METs  1.7          Exercise Comments: Exercise Comments    Row Name 05/27/19 1131           Exercise Comments  Off to a good start with exercise, tolerated low intensity exercise fair with some bilateral knee pain.          Exercise Goals and Review: Exercise Goals    Row Name 05/23/19 1033             Exercise Goals   Increase Physical Activity  Yes       Intervention  Provide advice, education, support and counseling about physical activity/exercise needs.;Develop  an individualized exercise prescription for aerobic and resistive training based on initial evaluation findings, risk stratification, comorbidities and participant's personal goals.       Expected Outcomes  Short Term: Attend rehab on a regular basis to increase amount of physical activity.;Long Term: Add in home exercise to make exercise part of routine and to increase amount of physical activity.;Long Term: Exercising regularly at least 3-5 days a week.       Increase Strength and Stamina  Yes       Intervention  Provide advice, education, support and counseling about physical activity/exercise needs.;Develop an individualized exercise prescription for aerobic and resistive training based on initial evaluation findings, risk stratification, comorbidities and participant's personal goals.       Expected Outcomes  Short Term: Increase workloads from initial exercise prescription for resistance, speed, and METs.;Short Term: Perform resistance training exercises routinely during rehab and add in resistance training at home;Long Term: Improve cardiorespiratory fitness, muscular endurance and strength as measured by increased METs and functional capacity (6MWT)       Able to understand and use rate of perceived exertion (RPE) scale  Yes       Intervention  Provide education and explanation on how to use RPE scale       Expected Outcomes  Short Term: Able to use RPE daily in rehab to express subjective intensity level;Long Term:  Able to use RPE to guide intensity level when exercising independently       Able to understand and use Dyspnea scale  Yes       Intervention  Provide education and explanation on how to use Dyspnea scale       Expected Outcomes  Short Term: Able to use Dyspnea scale daily in rehab to express subjective sense of shortness of breath during exertion;Long Term: Able to use Dyspnea scale to guide intensity level when exercising independently       Knowledge and understanding of Target  Heart Rate Range (THRR)  Yes       Intervention  Provide education and explanation of THRR including how the numbers were predicted and where they are located for reference       Expected Outcomes  Short Term: Able to state/look up THRR;Long Term: Able to use THRR to govern intensity when exercising independently;Short Term: Able to use daily as guideline for intensity in rehab       Able to check pulse independently  Yes       Intervention  Provide education and demonstration on how to check pulse in carotid and radial arteries.;Review the importance of being able to check your own pulse for safety during independent exercise       Expected Outcomes  Short Term: Able to explain why pulse checking is important during independent exercise;Long Term: Able to check pulse independently and accurately       Understanding of Exercise Prescription  Yes       Intervention  Provide education, explanation, and written materials on patient's individual exercise prescription       Expected Outcomes  Short Term: Able to explain program exercise prescription;Long Term: Able to explain home exercise prescription to exercise independently          Exercise Goals Re-Evaluation : Exercise Goals Re-Evaluation    [place] Name 05/27/19 1131             Exercise Goal Re-Evaluation   Exercise Goals Review  Able to understand and use rate of perceived exertion (RPE) scale;Increase Physical Activity       Comments  Patient able to understand and use RPE scale appropriately.       Expected Outcomes  Increase workloads  as tolerated to help increase strength and stamina.          Discharge Exercise Prescription (Final Exercise Prescription Changes): Exercise Prescription Changes - 05/27/19 1048      Response to Exercise   Blood Pressure (Admit)  114/70    Blood Pressure (Exercise)  128/76    Blood Pressure (Exit)  128/76    Heart Rate (Admit)  86 bpm    Heart Rate (Exercise)  88 bpm    Heart Rate (Exit)  80 bpm     Oxygen Saturation (Exercise)  95 %   room air   Rating of Perceived Exertion (Exercise)  12    Symptoms  Pt c/o bilateral knee pain with exercise.    Comments  Pt tolerated 1st exercise session well with some knee pain.    Duration  Progress to 30 minutes of  aerobic without signs/symptoms of physical distress    Intensity  THRR unchanged      Progression   Progression  Continue to progress workloads to maintain intensity without signs/symptoms of physical distress.    Average METs  1.6      Resistance Training   Training Prescription  Yes    Weight  2lbs    Reps  10-15    Time  10 Minutes      Interval Training   Interval Training  No      NuStep   Level  2    SPM  75    Minutes  15    METs  1.6      Arm Ergometer   Level  2    Minutes  15    METs  1.7       Nutrition:  Target Goals: Understanding of nutrition guidelines, daily intake of sodium 1500mg , cholesterol 200mg , calories 30% from fat and 7% or less from saturated fats, daily to have 5 or more servings of fruits and vegetables.  Biometrics: Pre Biometrics - 05/23/19 1033      Pre Biometrics   Height  5\' 2"  (1.575 m)    Weight  112.3 kg    Waist Circumference  46.5 inches    Hip Circumference  56 inches    Waist to Hip Ratio  0.83 %    BMI (Calculated)  45.27    Triceps Skinfold  70 mm    % Body Fat  57.9 %    Grip Strength  28 kg    Flexibility  8 in    Single Leg Stand  3.25 seconds        Nutrition Therapy Plan and Nutrition Goals:   Nutrition Assessments:   Nutrition Goals Re-Evaluation:   Nutrition Goals Re-Evaluation:   Nutrition Goals Discharge (Final Nutrition Goals Re-Evaluation):   Psychosocial: Target Goals: Acknowledge presence or absence of significant depression and/or stress, maximize coping skills, provide positive support system. Participant is able to verbalize types and ability to use techniques and skills needed for reducing stress and depression.  Initial  Review & Psychosocial Screening: Initial Psych Review & Screening - 05/23/19 1049      Initial Review   Current issues with  History of Depression      Family Dynamics   Good Support System?  Yes   [name] has her husband for support     Barriers   Psychosocial barriers to participate in program  The patient should benefit from training in stress management and relaxation.      Screening Interventions   Interventions  Encouraged to exercise       Quality of Life Scores: Quality of Life - 05/23/19 1035      Quality of Life   Select  Quality of Life      Quality of Life Scores   Health/Function Pre  24.8 %    Socioeconomic Pre  29 %    Psych/Spiritual Pre  24.25 %    Family Pre  30 %    GLOBAL Pre  26.3 %      Scores of 19 and below usually indicate a poorer quality of life in these areas.  A difference of  2-3 points is a clinically meaningful difference.  A difference of 2-3 points in the total score of the Quality of Life Index has been associated with significant improvement in overall quality of life, self-image, physical symptoms, and general health in studies assessing change in quality of life.  PHQ-9: Recent Review Flowsheet Data    Depression screen [facility] 2/9 05/27/2019 05/23/2019 03/20/2019 02/28/2018 01/24/2018   Decreased Interest 0 1 0 0 0   Down, Depressed, Hopeless 1 0 0 0 0   PHQ - 2 Score 1 1 0 0 0     Interpretation of Total Score  Total Score Depression Severity:  1-4 = Minimal depression, 5-9 = Mild depression, 10-14 = Moderate depression, 15-19 = Moderately severe depression, 20-27 = Severe depression   Psychosocial Evaluation and Intervention: Psychosocial Evaluation - 05/27/19 1121      Psychosocial Evaluation & Interventions   Interventions  Encouraged to exercise with the program and follow exercise prescription    Comments  [name] has a history of depression. She is currently feeling mild isolation from Covid-19 social distancing guidelines but is  hopeful that safe social interactions with CR staff and peers will help with her mild isolation feelings. She enjoys reading, cooking, and canning.    Expected Outcomes  [name] will continue to self monitor mild symptoms of isolation and report any escalation of symptoms to CR staff, medical providers, and/or careteam.    Continue Psychosocial Services   Follow up required by staff       Psychosocial Re-Evaluation:   Psychosocial Discharge (Final Psychosocial Re-Evaluation):   Vocational Rehabilitation: Provide vocational rehab assistance to qualifying candidates.   Vocational Rehab Evaluation & Intervention: Vocational Rehab - 05/23/19 1051      Initial Vocational Rehab Evaluation & Intervention   Assessment shows need for Vocational Rehabilitation  No   [name] is retired anfd does not need vocational rehab at this time      Education: Education Goals: Education classes will be provided on a weekly basis, covering required topics. Participant will state understanding/return demonstration of topics presented.  Learning Barriers/Preferences: Learning Barriers/Preferences - 05/23/19 1037      Learning Barriers/Preferences   Learning Barriers  None    Learning Preferences  Video;Pictoral       Education Topics: Count Your Pulse:  -Group instruction provided by verbal instruction, demonstration, patient participation and written materials to support subject.  Instructors address importance of being able to find your pulse and how to count your pulse when at home without a heart monitor.  Patients get hands on experience counting their pulse with staff help and individually.   CARDIAC REHAB PHASE II EXERCISE from 11/23/2018 in [place]  Date  11/09/18  Instruction Review Code  2- Demonstrated Understanding      Heart Attack, Angina, and Risk Factor Modification:  -Group instruction provided  by verbal instruction, video, and written materials to  support subject.  Instructors address signs and symptoms of angina and heart attacks.    Also discuss risk factors for heart disease and how to make changes to improve heart health risk factors.   Functional Fitness:  -Group instruction provided by verbal instruction, demonstration, patient participation, and written materials to support subject.  Instructors address safety measures for doing things around the house.  Discuss how to get up and down off the floor, how to pick things up properly, how to safely get out of a chair without assistance, and balance training.   Meditation and Mindfulness:  -Group instruction provided by verbal instruction, patient participation, and written materials to support subject.  Instructor addresses importance of mindfulness and meditation practice to help reduce stress and improve awareness.  Instructor also leads participants through a meditation exercise.    CARDIAC REHAB PHASE II EXERCISE from 11/23/2018 in [place]  Date  11/14/18  Instruction Review Code  2- Demonstrated Understanding      Stretching for Flexibility and Mobility:  -Group instruction provided by verbal instruction, patient participation, and written materials to support subject.  Instructors lead participants through series of stretches that are designed to increase flexibility thus improving mobility.  These stretches are additional exercise for major muscle groups that are typically performed during regular warm up and cool down.   Hands Only CPR:  -Group verbal, video, and participation provides a basic overview of AHA guidelines for community CPR. Role-play of emergencies allow participants the opportunity to practice calling for help and chest compression technique with discussion of AED use.   Hypertension: -Group verbal and written instruction that provides a basic overview of hypertension including the most recent diagnostic guidelines, risk  factor reduction with self-care instructions and medication management.   CARDIAC REHAB PHASE II EXERCISE from 11/23/2018 in [place]  Date  11/23/18  Instruction Review Code  2- Demonstrated Understanding       Nutrition I class: Heart Healthy Eating:  -Group instruction provided by PowerPoint slides, verbal discussion, and written materials to support subject matter. The instructor gives an explanation and review of the Therapeutic Lifestyle Changes diet recommendations, which includes a discussion on lipid goals, dietary fat, sodium, fiber, plant stanol/sterol esters, sugar, and the components of a well-balanced, healthy diet.   Nutrition II class: Lifestyle Skills:  -Group instruction provided by PowerPoint slides, verbal discussion, and written materials to support subject matter. The instructor gives an explanation and review of label reading, grocery shopping for heart health, heart healthy recipe modifications, and ways to make healthier choices when eating out.   Diabetes Question & Answer:  -Group instruction provided by PowerPoint slides, verbal discussion, and written materials to support subject matter. The instructor gives an explanation and review of diabetes co-morbidities, pre- and post-prandial blood glucose goals, pre-exercise blood glucose goals, signs, symptoms, and treatment of hypoglycemia and hyperglycemia, and foot care basics.   Diabetes Blitz:  -Group instruction provided by PowerPoint slides, verbal discussion, and written materials to support subject matter. The instructor gives an explanation and review of the physiology behind type 1 and type 2 diabetes, diabetes medications and rational behind using different medications, pre- and post-prandial blood glucose recommendations and Hemoglobin A1c goals, diabetes diet, and exercise including blood glucose guidelines for exercising safely.    Portion Distortion:  -Group instruction  provided by PowerPoint slides, verbal discussion, written materials, and food models to support  subject matter. The instructor gives an explanation of serving size versus portion size, changes in portions sizes over the last 20 years, and what consists of a serving from each food group.   Stress Management:  -Group instruction provided by verbal instruction, video, and written materials to support subject matter.  Instructors review role of stress in heart disease and how to cope with stress positively.     Exercising on Your Own:  -Group instruction provided by verbal instruction, power point, and written materials to support subject.  Instructors discuss benefits of exercise, components of exercise, frequency and intensity of exercise, and end points for exercise.  Also discuss use of nitroglycerin and activating EMS.  Review options of places to exercise outside of rehab.  Review guidelines for sex with heart disease.   Cardiac Drugs I:  -Group instruction provided by verbal instruction and written materials to support subject.  Instructor reviews cardiac drug classes: antiplatelets, anticoagulants, beta blockers, and statins.  Instructor discusses reasons, side effects, and lifestyle considerations for each drug class.   Cardiac Drugs II:  -Group instruction provided by verbal instruction and written materials to support subject.  Instructor reviews cardiac drug classes: angiotensin converting enzyme inhibitors (ACE-I), angiotensin II receptor blockers (ARBs), nitrates, and calcium channel blockers.  Instructor discusses reasons, side effects, and lifestyle considerations for each drug class.   Anatomy and Physiology of the Circulatory System:  Group verbal and written instruction and models provide basic cardiac anatomy and physiology, with the coronary electrical and arterial systems. Review of: AMI, Angina, Valve disease, Heart Failure, Peripheral Artery Disease, Cardiac Arrhythmia,  Pacemakers, and the ICD.   Other Education:  -Group or individual verbal, written, or video instructions that support the educational goals of the cardiac rehab program.   Holiday Eating Survival Tips:  -Group instruction provided by PowerPoint slides, verbal discussion, and written materials to support subject matter. The instructor gives patients tips, tricks, and techniques to help them not only survive but enjoy the holidays despite the onslaught of food that accompanies the holidays.   Knowledge Questionnaire Score: Knowledge Questionnaire Score - 05/23/19 1040      Knowledge Questionnaire Score   Pre Score  20/24       Core Components/Risk Factors/Patient Goals at Admission: Personal Goals and Risk Factors at Admission - 05/23/19 1051      Core Components/Risk Factors/Patient Goals on Admission    Weight Management  Yes;Obesity;Weight Loss;Weight Maintenance    Intervention  Weight Management: Develop a combined nutrition and exercise program designed to reach desired caloric intake, while maintaining appropriate intake of nutrient and fiber, sodium and fats, and appropriate energy expenditure required for the weight goal.;Weight Management: Provide education and appropriate resources to help participant work on and attain dietary goals.;Weight Management/Obesity: Establish reasonable short term and long term weight goals.;Obesity: Provide education and appropriate resources to help participant work on and attain dietary goals.    Admit Weight  247 lb 9.2 oz (112.3 kg)    Goal Weight: Long Term  197 lb (89.4 kg)    Expected Outcomes  Short Term: Continue to assess and modify interventions until short term weight is achieved;Long Term: Adherence to nutrition and physical activity/exercise program aimed toward attainment of established weight goal;Weight Maintenance: Understanding of the daily nutrition guidelines, which includes 25-35% calories from fat, 7% or less cal from saturated  fats, less than 200mg  cholesterol, less than 1.5gm of sodium, & 5 or more servings of fruits and vegetables daily;Weight Loss: Understanding of general  recommendations for a balanced deficit meal plan, which promotes 1-2 lb weight loss per week and includes a negative energy balance of [phone] kcal/d;Understanding recommendations for meals to include 15-35% energy as protein, 25-35% energy from fat, 35-60% energy from carbohydrates, less than 200mg  of dietary cholesterol, 20-35 gm of total fiber daily;Understanding of distribution of calorie intake throughout the day with the consumption of 4-5 meals/snacks    Improve shortness of breath with ADL's  Yes    Intervention  Provide education, individualized exercise plan and daily activity instruction to help decrease symptoms of SOB with activities of daily living.    Expected Outcomes  Short Term: Improve cardiorespiratory fitness to achieve a reduction of symptoms when performing ADLs;Long Term: Be able to perform more ADLs without symptoms or delay the onset of symptoms    Diabetes  Yes    Intervention  Provide education about signs/symptoms and action to take for hypo/hyperglycemia.;Provide education about proper nutrition, including hydration, and aerobic/resistive exercise prescription along with prescribed medications to achieve blood glucose in normal ranges: Fasting glucose 65-99 mg/dL    Expected Outcomes  Short Term: Participant verbalizes understanding of the signs/symptoms and immediate care of hyper/hypoglycemia, proper foot care and importance of medication, aerobic/resistive exercise and nutrition plan for blood glucose control.;Long Term: Attainment of HbA1C < 7%.    Heart Failure  Yes    Expected Outcomes  Improve functional capacity of life;Short term: Attendance in program 2-3 days a week with increased exercise capacity. Reported lower sodium intake. Reported increased fruit and vegetable intake. Reports medication compliance.;Short term:  Daily weights obtained and reported for increase. Utilizing diuretic protocols set by physician.;Long term: Adoption of self-care skills and reduction of barriers for early signs and symptoms recognition and intervention leading to self-care maintenance.    Hypertension  Yes    Intervention  Provide education on lifestyle modifcations including regular physical activity/exercise, weight management, moderate sodium restriction and increased consumption of fresh fruit, vegetables, and low fat dairy, alcohol moderation, and smoking cessation.;Monitor prescription use compliance.    Expected Outcomes  Short Term: Continued assessment and intervention until BP is < 140/35mm HG in hypertensive participants. < 130/76mm HG in hypertensive participants with diabetes, heart failure or chronic kidney disease.;Long Term: Maintenance of blood pressure at goal levels.    Lipids  Yes    Intervention  Provide education and support for participant on nutrition & aerobic/resistive exercise along with prescribed medications to achieve LDL 70mg , HDL >40mg .    Expected Outcomes  Short Term: Participant states understanding of desired cholesterol values and is compliant with medications prescribed. Participant is following exercise prescription and nutrition guidelines.;Long Term: Cholesterol controlled with medications as prescribed, with individualized exercise RX and with personalized nutrition plan. Value goals: LDL < 70mg , HDL > 40 mg.    Stress  Yes    Intervention  Offer individual and/or small group education and counseling on adjustment to heart disease, stress management and health-related lifestyle change. Teach and support self-help strategies.;Refer participants experiencing significant psychosocial distress to appropriate mental health specialists for further evaluation and treatment. When possible, include family members and significant others in education/counseling sessions.    Expected Outcomes  Short Term:  Participant demonstrates changes in health-related behavior, relaxation and other stress management skills, ability to obtain effective social support, and compliance with psychotropic medications if prescribed.;Long Term: Emotional wellbeing is indicated by absence of clinically significant psychosocial distress or social isolation.       Core Components/Risk Factors/Patient Goals Review:  Goals  and Risk Factor Review    Row Name 05/27/19 1127             Core Components/Risk Factors/Patient Goals Review   Personal Goals Review  Weight Management/Obesity;Diabetes;Heart Failure;Improve shortness of breath with ADL's;Hypertension;Lipids;Stress       Review  Patient with multiple with CAD risk factors and eager to participate in CR.       Expected Outcomes  Breindel will continue to participate in CR to modify risk factors, improve nutrition, and other lifestyle modifications such as weight loss. Her personal goals are to loose 50 lbs, increase her mobility, and decrease her shortness of breath.          Core Components/Risk Factors/Patient Goals at Discharge (Final Review):  Goals and Risk Factor Review - 05/27/19 1127      Core Components/Risk Factors/Patient Goals Review   Personal Goals Review  Weight Management/Obesity;Diabetes;Heart Failure;Improve shortness of breath with ADL's;Hypertension;Lipids;Stress    Review  Patient with multiple with CAD risk factors and eager to participate in CR.    Expected Outcomes  Marianela will continue to participate in CR to modify risk factors, improve nutrition, and other lifestyle modifications such as weight loss. Her personal goals are to loose 50 lbs, increase her mobility, and decrease her shortness of breath.       ITP Comments: ITP Comments    Row Name 05/23/19 1003 05/27/19 1111         ITP Comments  Dr Fransico Him MD, Medical Director  30 Day ITP Review. Maryetta began her individual exercise program today in Cardiac Rehab and  tolerated well.         Comments: See ITP Comments.

## 2019-05-31 ENCOUNTER — Encounter (HOSPITAL_COMMUNITY)
Admission: RE | Admit: 2019-05-31 | Discharge: 2019-05-31 | Disposition: A | Discharge status: Home / Self Care | Payer: BC Managed Care – PPO | Source: Ambulatory Visit | Attending: Cardiology | Admitting: Cardiology

## 2019-05-31 ENCOUNTER — Other Ambulatory Visit: Payer: Self-pay

## 2019-05-31 DIAGNOSIS — Z951 Presence of aortocoronary bypass graft: Secondary | ICD-10-CM

## 2019-06-03 ENCOUNTER — Encounter (HOSPITAL_COMMUNITY)
Admission: RE | Admit: 2019-06-03 | Discharge: 2019-06-03 | Disposition: A | Discharge status: Home / Self Care | Payer: BC Managed Care – PPO | Source: Ambulatory Visit | Attending: Cardiology | Admitting: Cardiology

## 2019-06-03 ENCOUNTER — Other Ambulatory Visit: Payer: Self-pay

## 2019-06-03 DIAGNOSIS — Z951 Presence of aortocoronary bypass graft: Secondary | ICD-10-CM

## 2019-06-05 ENCOUNTER — Encounter (HOSPITAL_COMMUNITY)
Admission: RE | Admit: 2019-06-05 | Discharge: 2019-06-05 | Disposition: A | Discharge status: Home / Self Care | Payer: BC Managed Care – PPO | Source: Ambulatory Visit | Attending: Cardiology | Admitting: Cardiology

## 2019-06-05 ENCOUNTER — Other Ambulatory Visit: Payer: Self-pay

## 2019-06-05 DIAGNOSIS — Z951 Presence of aortocoronary bypass graft: Secondary | ICD-10-CM | POA: Diagnosis not present

## 2019-06-07 ENCOUNTER — Encounter (HOSPITAL_COMMUNITY): Payer: BC Managed Care – PPO

## 2019-06-07 ENCOUNTER — Telehealth (HOSPITAL_COMMUNITY): Payer: Self-pay | Admitting: Family Medicine

## 2019-06-07 DIAGNOSIS — E782 Mixed hyperlipidemia: Secondary | ICD-10-CM | POA: Diagnosis not present

## 2019-06-07 LAB — LIPID PANEL
Chol/HDL Ratio: 4.6 ratio — ABNORMAL HIGH (ref 0.0–4.4)
Cholesterol, Total: 158 mg/dL (ref 100–199)
HDL: 34 mg/dL — ABNORMAL LOW (ref >39)
LDL Chol Calc (NIH): 78 mg/dL (ref 0–99)
Triglycerides: 281 mg/dL — ABNORMAL HIGH (ref 0–149)
VLDL Cholesterol Cal: 46 mg/dL — ABNORMAL HIGH (ref 5–40)

## 2019-06-10 ENCOUNTER — Encounter (HOSPITAL_COMMUNITY)
Admission: RE | Admit: 2019-06-10 | Discharge: 2019-06-10 | Disposition: A | Discharge status: Home / Self Care | Payer: BC Managed Care – PPO | Source: Ambulatory Visit | Attending: Cardiology | Admitting: Cardiology

## 2019-06-10 ENCOUNTER — Other Ambulatory Visit: Payer: Self-pay

## 2019-06-10 DIAGNOSIS — Z951 Presence of aortocoronary bypass graft: Secondary | ICD-10-CM

## 2019-06-10 NOTE — Progress Notes (Signed)
Reviewed home exercise guidelines with patient including endpoints, temperature precautions, target heart rate and rate of perceived exertion. Pt is walking as her mode of home exercise. Pt voices understanding of instructions given. Pamela Lindo M [name], MS, ACSM CEP   

## 2019-06-11 ENCOUNTER — Encounter: Payer: Self-pay | Admitting: Internal Medicine

## 2019-06-11 ENCOUNTER — Ambulatory Visit: Payer: BC Managed Care – PPO | Admitting: Internal Medicine

## 2019-06-11 ENCOUNTER — Other Ambulatory Visit: Payer: Self-pay

## 2019-06-11 VITALS — BP 118/70 | HR 85 | Ht 62.0 in | Wt 249.5 lb

## 2019-06-11 DIAGNOSIS — E7800 Pure hypercholesterolemia, unspecified: Secondary | ICD-10-CM

## 2019-06-11 DIAGNOSIS — H43391 Other vitreous opacities, right eye: Secondary | ICD-10-CM | POA: Diagnosis not present

## 2019-06-11 DIAGNOSIS — H43813 Vitreous degeneration, bilateral: Secondary | ICD-10-CM | POA: Diagnosis not present

## 2019-06-11 DIAGNOSIS — H35033 Hypertensive retinopathy, bilateral: Secondary | ICD-10-CM | POA: Diagnosis not present

## 2019-06-11 DIAGNOSIS — Z951 Presence of aortocoronary bypass graft: Secondary | ICD-10-CM | POA: Diagnosis not present

## 2019-06-11 DIAGNOSIS — H2513 Age-related nuclear cataract, bilateral: Secondary | ICD-10-CM | POA: Diagnosis not present

## 2019-06-11 NOTE — Patient Instructions (Signed)
Medication Instructions:  Your physician recommends that you continue on your current medications as directed. Please refer to the Current Medication list given to you today.  If you need a refill on your cardiac medications before your next appointment, please call your pharmacy.   Lab work: FASTING lab work prior to next appointment with Dr. Debara Pickett (March 2020) If you have labs (blood work) drawn today and your tests are completely normal, you will receive your results only by: Marland Kitchen MyChart Message (if you have MyChart) OR . A paper copy in the mail If you have any lab test that is abnormal or we need to change your treatment, we will call you to review the results.  Testing/Procedures: NONE  Follow-Up: . Dr. Debara Pickett recommends that you schedule a follow up visit with him the in the [facility] in 6 months. Please have fasting blood work about 1 week prior to this visit and he will review the blood work results with you at your appointment.  .   Any Other Special Instructions Will Be Listed Below (If Applicable). You have been referred to Dr. Dennard Nip  [facility] [address] [place]. [place], [place] [address] [phone]

## 2019-06-11 NOTE — Progress Notes (Signed)
LIPID CLINIC CONSULT NOTE  Chief Complaint:  Dyslipidemia  Primary Care Physician: Dickie La, MD  HPI:  Pamela Thomas is a 63 y.o. female who is being seen today for the evaluation of lipidemia at the request of Dickie La, MD.  This is a pleasant 63 year old female who unfortunately suffered a recent acute anterior MI.  She was babysitting her granddaughter in the [place] area and presented with acute chest pain.  She was found by cardiac catheterization to have an occluded right coronary artery as well as severe LAD disease.  Ultimately she was considered for coronary artery bypass grafting however wish to have further evaluation here in [place].  She is a patient of Dr. Percival Spanish who has referred her to see Dr. Servando Snare next week for surgical evaluation.  In addition to this she has a long-standing history of elevated triglycerides and cholesterol.  Last year her triglycerides were as high as 850.  Recently she had lab work at [facility] which showed total cholesterol of 188, triglycerides 403 and HDL 33.  LDL was not calculated.  A direct LDL from earlier this year was 123.  She has not been on statin therapy.  She was appropriately started on high intensity atorvastatin 80 mg for her acute MI which she is tolerating.  She is also on dual antiplatelet therapy and a beta-blocker.  She does report a fairly atherogenic diet and is morbidly obese.  She says she has been trying to work on dietary changes and weight loss to help with her numbers.  She is also type II diabetic on metformin.  06/11/2019  Seen today in follow-up.  She is done well and had reached goal cholesterol on high potency atorvastatin and Vascepa.  Unfortunately she was having significant side effects, presumably from 80 mg of atorvastatin.  She felt overall generalized weakness and muscle soreness.  I decreased the dose of her atorvastatin from 80 to 40 mg and she said she felt an almost immediate improvement in that.  She  said is much better tolerated now.  Despite this changes there is only been a modest increase in her cholesterol.  Most recently total cholesterol is 158, triglycerides 281 (increased from 212), HDL 34 and LDL 78.  Her goal LDL is less than 70, however now that she is not in as much discomfort, I suspect that we can continue to work on weight loss and increased physical activity.  She notes that she is restarted in cardiac rehab.  PMHx:  Past Medical History:  Diagnosis Date  . ALLERGIC RHINITIS, SEASONAL   . BCC (basal cell carcinoma of skin) 2005   [facility])  . Coronary artery disease   . Diabetes mellitus without complication ([place])   . DYSLIPIDEMIA   . Dysrhythmia    palpitations-evaluated by Dr Johnsie Cancel 6/12/eccho 6/13  . Glucose intolerance (impaired glucose tolerance)   . History of blood transfusion   . Hypertension   . OBESITY, NOS   . Osteoarthritis    rt knee  . PLANTAR FASCIITIS, BILATERAL   . Vertigo     Past Surgical History:  Procedure Laterality Date  . BREAST BIOPSY  05/27/03   left  . CARDIAC CATHETERIZATION    . CARPAL TUNNEL RELEASE  05/27/03   Right  . CESAREAN SECTION  05/27/03  . CORONARY ARTERY BYPASS GRAFT N/A 08/17/2018   Procedure: CORONARY ARTERY BYPASS GRAFTING (CABG) TIMES TWO: LIMA to LAD, SVG to RAMUS INTERMEDIATE)  USING LEFT INTERNAL  MAMMARY ARTERY AND RIGHT GREATER SAPHENOUS VEIN HARVESTED ENDOSCOPICALLY.;  Surgeon: [name], MD;  Location: [facility];  Service: Open Heart Surgery;  Laterality: N/A;  . KNEE SURGERY     left- removal fatty tumor  . ROTATOR CUFF REPAIR     Bilateral  . TEE WITHOUT CARDIOVERSION N/A 08/17/2018   Procedure: TRANSESOPHAGEAL ECHOCARDIOGRAM (TEE);  Surgeon: [name], MD;  Location: [facility];  Service: Open Heart Surgery;  Laterality: N/A;  . TOTAL KNEE ARTHROPLASTY  10/10/2012   Procedure: TOTAL KNEE ARTHROPLASTY;  Surgeon: Gearlean Alf, MD;  Location: [facility];  Service: Orthopedics;   Laterality: Right;    FAMHx:  Family History  Problem Relation Age of Onset  . COPD Mother        Deceased  . Lung cancer Father        Deceaesd  . Diabetes Other   . Melanoma Brother        Deceased  . COPD Brother   . Healthy Daughter     SOCHx:   reports that she quit smoking about 28 years ago. She has never used smokeless tobacco. She reports current alcohol use. She reports that she does not use drugs.  ALLERGIES:  Allergies  Allergen Reactions  . Morphine And Related     Severe hallucinations, difficult arousal.   . Statins     Myalgia pain moderate, flu like symptoms.   . Oxycodone Nausea Only    Has to take nausea medications if taken. Prefer not to take    ROS: Pertinent items noted in HPI and remainder of comprehensive ROS otherwise negative.  HOME MEDS: Current Outpatient Medications on File Prior to Visit  Medication Sig Dispense Refill  . acetaminophen (TYLENOL) 500 MG tablet Take one or two tablets every 8 hours as needed max number of 6 tabs in 24 hours 200 tablet 5  . amLODipine (NORVASC) 10 MG tablet TAKE 1 TABLET BY MOUTH EVERY DAY 30 tablet 10  . aspirin 81 MG tablet Take 1 tablet (81 mg total) by mouth daily. (Patient taking differently: Take 81 mg by mouth every morning. ) 100 tablet 3  . atorvastatin (LIPITOR) 40 MG tablet Take 1 tablet (40 mg total) by mouth daily. 90 tablet 3  . candesartan (ATACAND) 32 MG tablet TAKE 1 TABLET BY MOUTH EVERY DAY 90 tablet 3  . chlorthalidone (HYGROTON) 25 MG tablet Take 1 tablet (25 mg total) by mouth daily. 90 tablet 1  . clopidogrel (PLAVIX) 75 MG tablet Take 1 tablet (75 mg total) by mouth daily. 90 tablet 3  . FLUoxetine (PROZAC) 20 MG tablet TAKE 1 TABLET BY MOUTH EVERY DAY 90 tablet 3  . furosemide (LASIX) 20 MG tablet TAKE 1 TABLET BY MOUTH TWICE A DAY 180 tablet 3  . KLOR-CON M20 20 MEQ tablet TAKE 1 TABLET BY MOUTH EVERY DAY 90 tablet 3  . metFORMIN (GLUCOPHAGE) 1000 MG tablet TAKE 1 TABLET (1,000 MG  TOTAL) BY MOUTH 2 (TWO) TIMES DAILY WITH A MEAL. 180 tablet 3  . metoprolol succinate (TOPROL-XL) 50 MG 24 hr tablet TAKE 1 TABLET BY MOUTH TWICE A DAY (Patient taking differently: Take 50 mg by mouth 2 (two) times daily. ) 180 tablet 3  . VASCEPA 1 g CAPS Take 2 capsules by mouth 2 (two) times daily.      No current facility-administered medications on file prior to visit.     LABS/IMAGING: No results found for this or any previous visit (from the  past 48 hour(s)). No results found.  LIPID PANEL:    Component Value Date/Time   CHOL 158 06/07/2019 0947   TRIG 281 (H) 06/07/2019 0947   HDL 34 (L) 06/07/2019 0947   CHOLHDL 4.6 (H) 06/07/2019 0947   CHOLHDL 5.5 05/11/2011 0932   VLDL NOT CALC 05/11/2011 0932   LDLCALC 78 06/07/2019 0947   LDLDIRECT 63 01/14/2019 1005   LDLDIRECT 76 07/27/2016 1048    WEIGHTS: Wt Readings from Last 3 Encounters:  06/11/19 249 lb 8 oz (113.2 kg)  05/23/19 247 lb 9.2 oz (112.3 kg)  04/26/19 250 lb (113.4 kg)    VITALS: BP 118/70 (BP Location: Right Wrist, Patient Position: Sitting, Cuff Size: Normal)   Pulse 85   Ht 5\' 2"  (1.575 m)   Wt 249 lb 8 oz (113.2 kg)   LMP 10/10/2010   SpO2 96%   BMI 45.63 kg/m   EXAM: Deferred  EKG: Deferred  ASSESSMENT: 1. Mixed dyslipidemia (elevated LDL and high triglycerides, low HDL) 2. Morbid obesity 3. Type 2 diabetes 4. Recent acute inferior MI  PLAN: 1.   Mrs. Whicker has had a significant improvement in her lipid profile.  There is only modest increase in her triglycerides mostly and a slight increase in LDL with a decrease in her atorvastatin.  She did not tolerate 80 mg atorvastatin therefore we will continue on the lower dose.  Although she is slightly above goal, I think we can make up the difference with weight loss and increased exercise.  We discussed options for weight management and she is agreeable for referral to the weight management clinic.  She will continue in cardiac rehabilitation.   She follows with Dr. Percival Spanish.  I would plan a repeat lipid in 6 months.  Pamela Casino, MD, [facility] [place], [place] Director of the Advanced Lipid Disorders &  Cardiovascular Risk Reduction Clinic Diplomate of the American Board of Clinical Lipidology Attending Cardiologist  Direct Dial: [phone]  Fax: [phone]  Website:  [email or web address] 06/11/2019, 8:07 AM

## 2019-06-12 ENCOUNTER — Encounter (HOSPITAL_COMMUNITY)
Admission: RE | Admit: 2019-06-12 | Discharge: 2019-06-12 | Disposition: A | Discharge status: Home / Self Care | Payer: BC Managed Care – PPO | Source: Ambulatory Visit | Attending: Cardiology | Admitting: Cardiology

## 2019-06-12 DIAGNOSIS — Z951 Presence of aortocoronary bypass graft: Secondary | ICD-10-CM | POA: Diagnosis not present

## 2019-06-14 ENCOUNTER — Other Ambulatory Visit: Payer: Self-pay

## 2019-06-14 ENCOUNTER — Encounter (HOSPITAL_COMMUNITY)
Admission: RE | Admit: 2019-06-14 | Discharge: 2019-06-14 | Disposition: A | Discharge status: Home / Self Care | Payer: BC Managed Care – PPO | Source: Ambulatory Visit | Attending: Cardiology | Admitting: Cardiology

## 2019-06-14 DIAGNOSIS — Z951 Presence of aortocoronary bypass graft: Secondary | ICD-10-CM | POA: Diagnosis not present

## 2019-06-17 ENCOUNTER — Other Ambulatory Visit: Payer: Self-pay

## 2019-06-17 ENCOUNTER — Encounter (HOSPITAL_COMMUNITY)
Admission: RE | Admit: 2019-06-17 | Discharge: 2019-06-17 | Disposition: A | Discharge status: Home / Self Care | Payer: BC Managed Care – PPO | Source: Ambulatory Visit | Attending: Cardiology | Admitting: Cardiology

## 2019-06-17 DIAGNOSIS — Z951 Presence of aortocoronary bypass graft: Secondary | ICD-10-CM | POA: Diagnosis not present

## 2019-06-19 ENCOUNTER — Encounter (HOSPITAL_COMMUNITY)
Admission: RE | Admit: 2019-06-19 | Discharge: 2019-06-19 | Disposition: A | Discharge status: Home / Self Care | Payer: BC Managed Care – PPO | Source: Ambulatory Visit | Attending: Cardiology | Admitting: Cardiology

## 2019-06-19 ENCOUNTER — Other Ambulatory Visit: Payer: Self-pay

## 2019-06-19 DIAGNOSIS — Z951 Presence of aortocoronary bypass graft: Secondary | ICD-10-CM

## 2019-06-19 DIAGNOSIS — L910 Hypertrophic scar: Secondary | ICD-10-CM | POA: Diagnosis not present

## 2019-06-19 DIAGNOSIS — D485 Neoplasm of uncertain behavior of skin: Secondary | ICD-10-CM | POA: Diagnosis not present

## 2019-06-21 ENCOUNTER — Encounter (HOSPITAL_COMMUNITY): Payer: BC Managed Care – PPO

## 2019-06-24 ENCOUNTER — Other Ambulatory Visit: Payer: Self-pay

## 2019-06-24 ENCOUNTER — Encounter (HOSPITAL_COMMUNITY)
Admission: RE | Admit: 2019-06-24 | Discharge: 2019-06-24 | Disposition: A | Discharge status: Home / Self Care | Payer: BC Managed Care – PPO | Source: Ambulatory Visit | Attending: Cardiology | Admitting: Cardiology

## 2019-06-24 DIAGNOSIS — Z951 Presence of aortocoronary bypass graft: Secondary | ICD-10-CM | POA: Diagnosis not present

## 2019-06-24 LAB — GLUCOSE, CAPILLARY: Glucose-Capillary: 195 mg/dL — ABNORMAL HIGH (ref 70–99)

## 2019-06-25 IMAGING — DX DG CHEST 2V
2 series · 2 of 2 positions shown · non-contrast
Comparison: October 05, 2012

CLINICAL DATA: Heart attack July 16, 2018. No chest complaints
today.

EXAM:
CHEST - 2 VIEW

[chest pa]
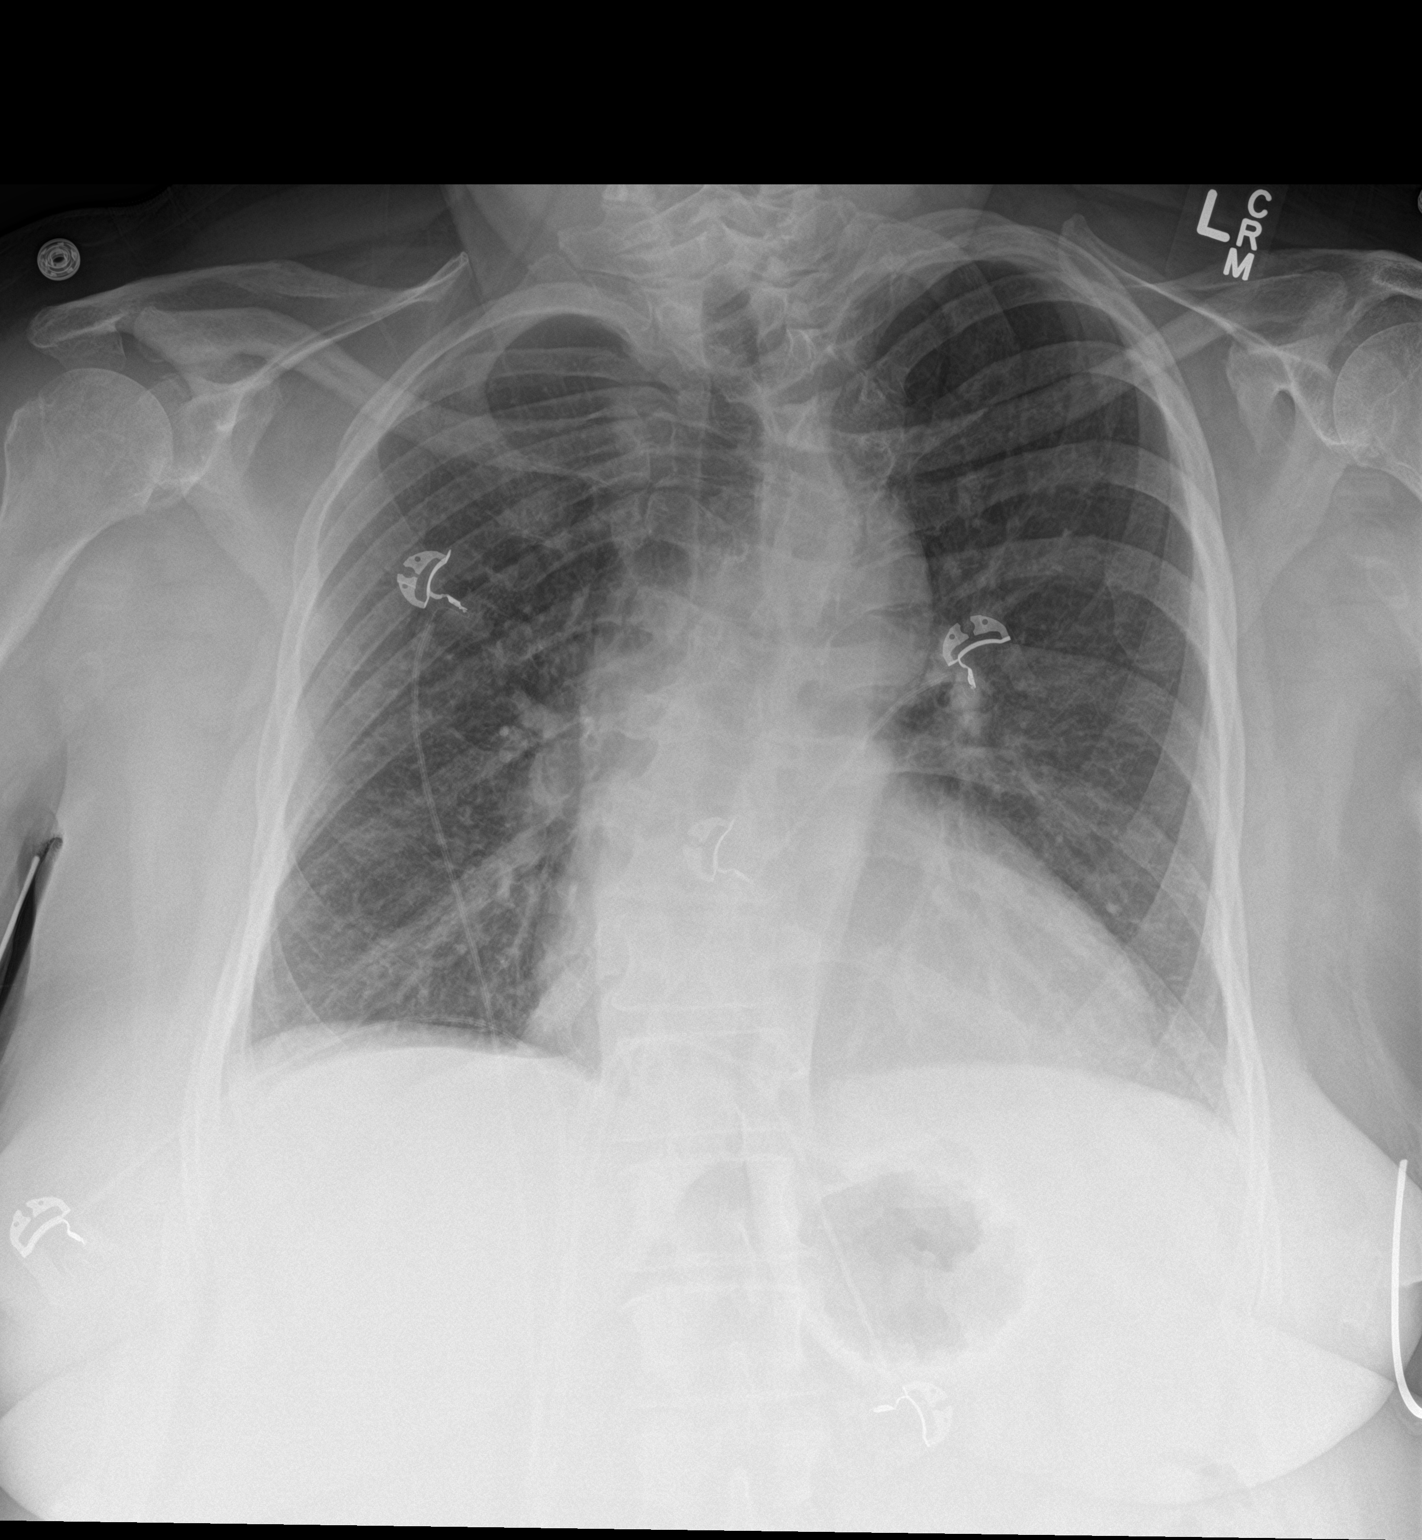

[chest lat]
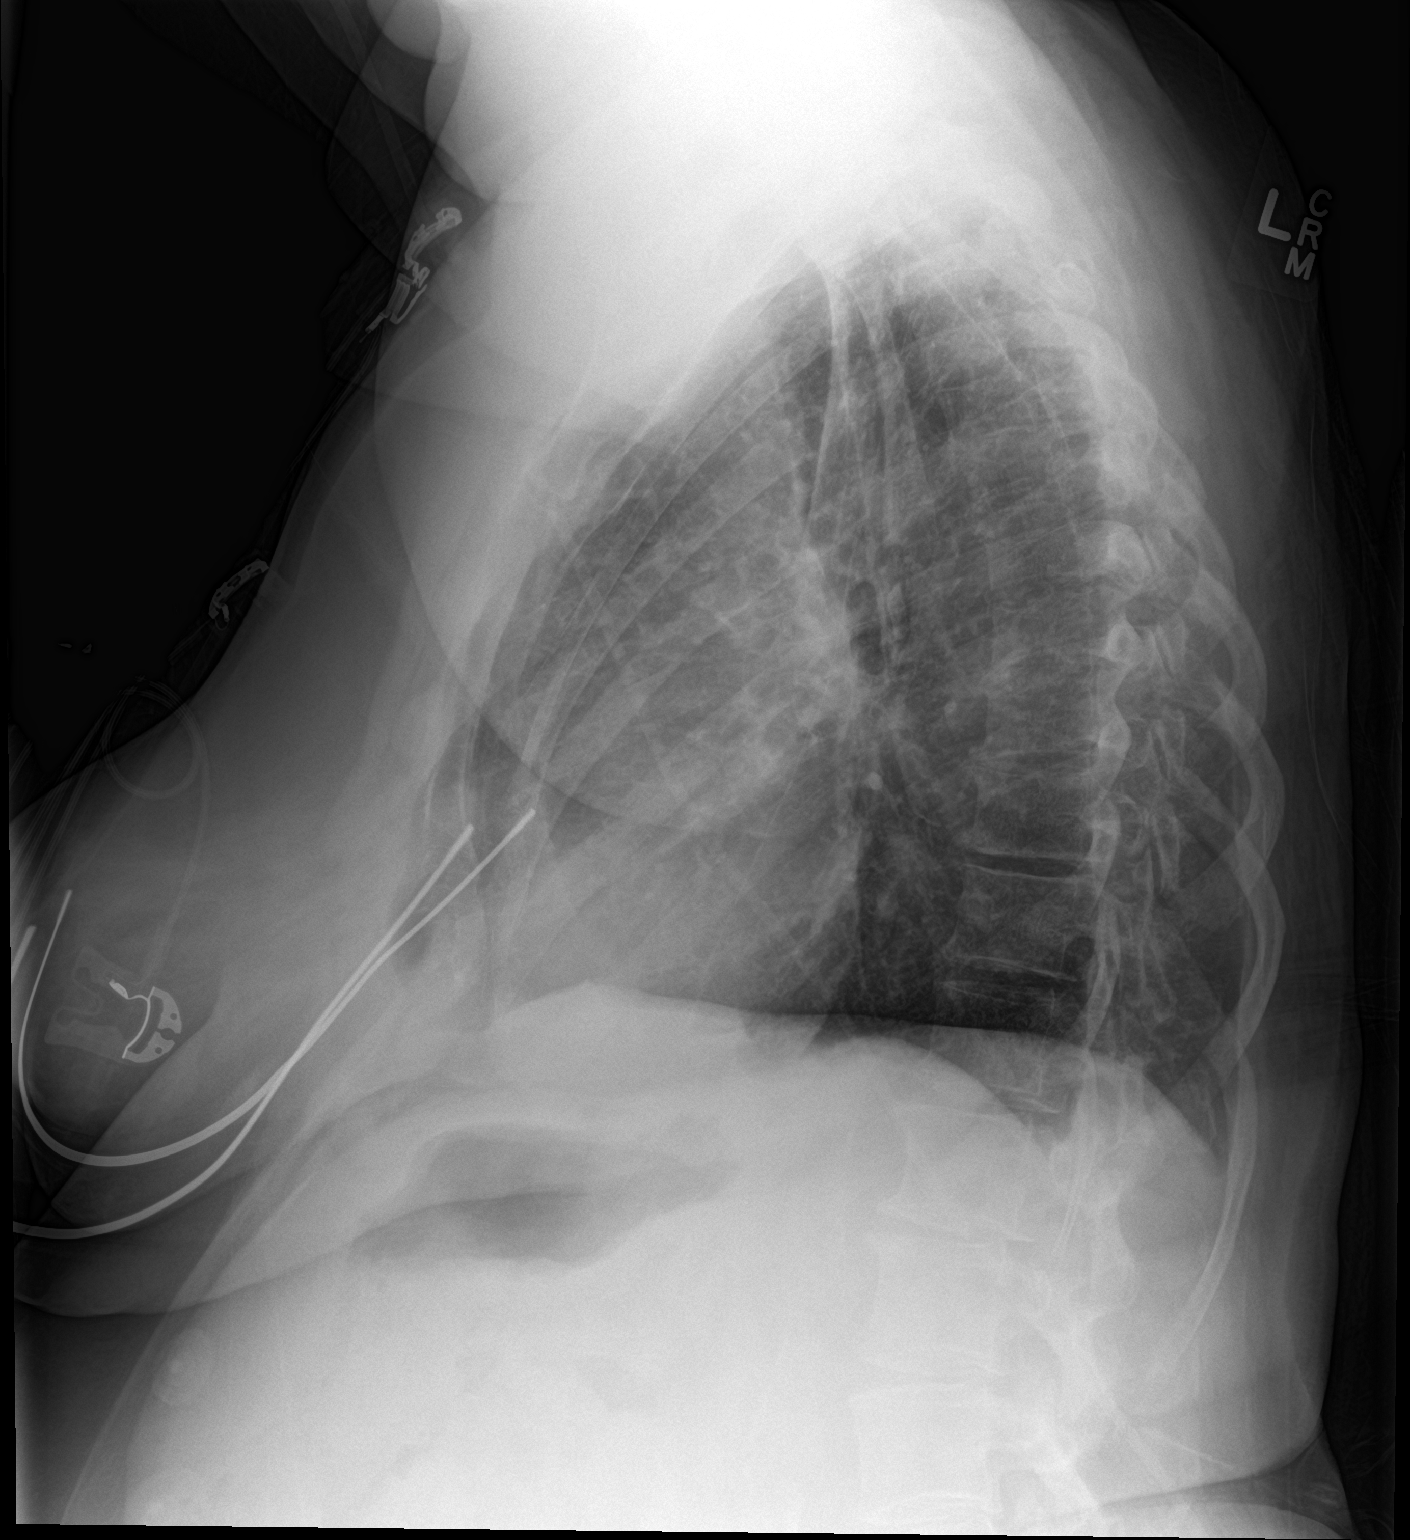

[2 of 2 positions shown; findings below may reference images not displayed]

FINDINGS: The heart size and mediastinal contours are within normal limits.
Both lungs are clear. The visualized skeletal structures are
unremarkable.
IMPRESSION: No active cardiopulmonary disease.

## 2019-06-26 ENCOUNTER — Other Ambulatory Visit: Payer: Self-pay

## 2019-06-26 ENCOUNTER — Encounter (HOSPITAL_COMMUNITY)
Admission: RE | Admit: 2019-06-26 | Discharge: 2019-06-26 | Disposition: A | Discharge status: Home / Self Care | Payer: BC Managed Care – PPO | Source: Ambulatory Visit | Attending: Cardiology | Admitting: Cardiology

## 2019-06-26 DIAGNOSIS — Z951 Presence of aortocoronary bypass graft: Secondary | ICD-10-CM | POA: Diagnosis not present

## 2019-06-27 NOTE — Progress Notes (Signed)
Cardiac Individual Treatment Plan  Patient Details  Name: Pamela Thomas MRN: [id number] Date of Birth: 1956/06/26 Referring Provider:     CARDIAC REHAB PHASE II ORIENTATION from 05/23/2019 in [facility]  Referring Provider  Dr. Percival Spanish      Initial Encounter Date:    CARDIAC REHAB PHASE II ORIENTATION from 05/23/2019 in [facility]  Date  05/23/19      Visit Diagnosis: 08/17/18 S/P CABG x 2 - Plan: Amb Referral To Provider Referral Exercise Program (P.R.E.P)  Patient's Home Medications on Admission:  Current Outpatient Medications:  .  acetaminophen (TYLENOL) 500 MG tablet, Take one or two tablets every 8 hours as needed max number of 6 tabs in 24 hours, Disp: 200 tablet, Rfl: 5 .  amLODipine (NORVASC) 10 MG tablet, TAKE 1 TABLET BY MOUTH EVERY DAY, Disp: 30 tablet, Rfl: 10 .  aspirin 81 MG tablet, Take 1 tablet (81 mg total) by mouth daily. (Patient taking differently: Take 81 mg by mouth every morning. ), Disp: 100 tablet, Rfl: 3 .  atorvastatin (LIPITOR) 40 MG tablet, Take 1 tablet (40 mg total) by mouth daily., Disp: 90 tablet, Rfl: 3 .  candesartan (ATACAND) 32 MG tablet, TAKE 1 TABLET BY MOUTH EVERY DAY, Disp: 90 tablet, Rfl: 3 .  chlorthalidone (HYGROTON) 25 MG tablet, Take 1 tablet (25 mg total) by mouth daily., Disp: 90 tablet, Rfl: 1 .  clopidogrel (PLAVIX) 75 MG tablet, Take 1 tablet (75 mg total) by mouth daily., Disp: 90 tablet, Rfl: 3 .  FLUoxetine (PROZAC) 20 MG tablet, TAKE 1 TABLET BY MOUTH EVERY DAY, Disp: 90 tablet, Rfl: 3 .  furosemide (LASIX) 20 MG tablet, TAKE 1 TABLET BY MOUTH TWICE A DAY, Disp: 180 tablet, Rfl: 3 .  KLOR-CON M20 20 MEQ tablet, TAKE 1 TABLET BY MOUTH EVERY DAY, Disp: 90 tablet, Rfl: 3 .  metFORMIN (GLUCOPHAGE) 1000 MG tablet, TAKE 1 TABLET (1,000 MG TOTAL) BY MOUTH 2 (TWO) TIMES DAILY WITH A MEAL., Disp: 180 tablet, Rfl: 3 .  metoprolol succinate (TOPROL-XL) 50 MG 24 hr tablet,  TAKE 1 TABLET BY MOUTH TWICE A DAY (Patient taking differently: Take 50 mg by mouth 2 (two) times daily. ), Disp: 180 tablet, Rfl: 3 .  VASCEPA 1 g CAPS, Take 2 capsules by mouth 2 (two) times daily. , Disp: , Rfl:   Past Medical History: Past Medical History:  Diagnosis Date  . ALLERGIC RHINITIS, SEASONAL   . BCC (basal cell carcinoma of skin) 2005   Nose [facility])  . Coronary artery disease   . Diabetes mellitus without complication ([place])   . DYSLIPIDEMIA   . Dysrhythmia    palpitations-evaluated by Dr Johnsie Cancel 6/12/[facility] 6/13  . Glucose intolerance (impaired glucose tolerance)   . History of blood transfusion   . Hypertension   . OBESITY, NOS   . Osteoarthritis    rt knee  . PLANTAR FASCIITIS, BILATERAL   . Vertigo     Tobacco Use: Social History   Tobacco Use  Smoking Status Former Smoker  . Quit date: 09/12/1990  . Years since quitting: 28.8  Smokeless Tobacco Never Used    Labs: Recent Chemical engineer    Labs for ITP Cardiac and Pulmonary Rehab Latest Ref Rng & Units 08/21/2018 01/14/2019 01/30/2019 03/20/2019 06/07/2019   Cholestrol 100 - 199 mg/dL - 125 - - 158   LDLCALC 0 - 99 mg/dL - 49 - - 78   LDLDIRECT 0 -  99 mg/dL - 63 - - -   HDL >39 mg/dL - 34(L) - - 34(L)   Trlycerides 0 - 149 mg/dL - 212(H) - - 281(H)   Hemoglobin A1c 0.0 - 7.0 % - - 7.2(A) 7.1(A) -   PHART 7.350 - 7.450 7.311(L) - - - -   PCO2ART 32.0 - 48.0 mmHg 57.7(H) - - - -   HCO3 20.0 - 28.0 mmol/L 28.3(H) - - - -   TCO2 22 - 32 mmol/L - - - - -   ACIDBASEDEF 0.0 - 2.0 mmol/L - - - - -   O2SAT % 99.5 - - - -      Capillary Blood Glucose: Lab Results  Component Value Date   GLUCAP 195 (H) 06/24/2019   GLUCAP 228 (H) 05/29/2019   GLUCAP 140 (H) 05/27/2019   GLUCAP 196 (H) 10/31/2018   GLUCAP 176 (H) 10/31/2018     Exercise Target Goals: Exercise Program Goal: Individual exercise prescription set using results from initial 6 min walk test and THRR while considering   patient's activity barriers and safety.   Exercise Prescription Goal: Starting with aerobic activity 30 plus minutes a day, 3 days per week for initial exercise prescription. Provide home exercise prescription and guidelines that participant acknowledges understanding prior to discharge.  Activity Barriers & Risk Stratification: Activity Barriers & Cardiac Risk Stratification - 05/23/19 1027      Activity Barriers & Cardiac Risk Stratification   Activity Barriers  Balance Concerns;Deconditioning;History of Falls;Muscular Weakness;Shortness of Breath    Cardiac Risk Stratification  High       6 Minute Walk: 6 Minute Walk    Row Name 05/23/19 1026 06/26/19 1109       6 Minute Walk   Phase  Initial  Discharge    Distance  1088 feet  927 feet    Distance % Change  -  14.8 %    Walk Time  6 minutes  6 minutes    # of Rest Breaks  1 19 sec. rest break.  1 Seated rest break: 1 minute 36 seconds    MPH  2  2.39    METS  2.1  1.74    RPE  12  13    Perceived Dyspnea   2  2    VO2 Peak  7.58  6.1    Symptoms  Yes (comment)  Yes (comment)    Comments  SOB-2, muscle fatigue  Bilateral knee pain, 4/10 on the pain scale. DOE 2/4.    Resting HR  82 bpm  82 bpm    Resting BP  118/68  114/60    Resting Oxygen Saturation   98 %  -    Exercise Oxygen Saturation  during 6 min walk  85 %  94 % room air    Max Ex. HR  116 bpm  104 bpm    Max Ex. BP  140/74  140/80    2 Minute Post BP  112/68  110/72       Oxygen Initial Assessment:   Oxygen Re-Evaluation:   Oxygen Discharge (Final Oxygen Re-Evaluation):   Initial Exercise Prescription: Initial Exercise Prescription - 05/23/19 1000      Date of Initial Exercise RX and Referring Provider   Date  05/23/19    Referring Provider  Dr. Percival Spanish    Expected Discharge Date  06/14/19      NuStep   Level  2    SPM  75  Minutes  15    METs  1.9      Arm Ergometer   Level  1    Watts  20    Minutes  15    METs  1.9       Prescription Details   Frequency (times per week)  3    Duration  Progress to 30 minutes of continuous aerobic without signs/symptoms of physical distress      Intensity   THRR 40-80% of Max Heartrate  63-126    Ratings of Perceived Exertion  11-13    Perceived Dyspnea  0-4      Progression   Progression  Continue to progress workloads to maintain intensity without signs/symptoms of physical distress.      Resistance Training   Training Prescription  Yes    Weight  2lbs    Reps  10-15       Perform Capillary Blood Glucose checks as needed.  Exercise Prescription Changes: Exercise Prescription Changes    Row Name 05/27/19 1048 06/10/19 1048 06/12/19 1048 06/26/19 1106       Response to Exercise   Blood Pressure (Admit)  114/70  106/64  112/70  114/60    Blood Pressure (Exercise)  128/76  142/82  128/78  140/80    Blood Pressure (Exit)  128/76  114/70  100/68  110/72    Heart Rate (Admit)  86 bpm  99 bpm  82 bpm  82 bpm    Heart Rate (Exercise)  88 bpm  97 bpm  104 bpm  104 bpm    Heart Rate (Exit)  80 bpm  86 bpm  82 bpm  66 bpm    Oxygen Saturation (Exercise)  95 % room air  -  -  94 % room air    Rating of Perceived Exertion (Exercise)  12  12  13  13     Perceived Dyspnea (Exercise)  -  -  -  2    Symptoms  Pt c/o bilateral knee pain with exercise.  -  -  Pt c/o bilateral knee pain and DOE diuring 6MWT.    Comments  Pt tolerated 1st exercise session well with some knee pain.  -  Switched from arm ergometer to treadmill and tolerated well.  -    Duration  Progress to 30 minutes of  aerobic without signs/symptoms of physical distress  Progress to 30 minutes of  aerobic without signs/symptoms of physical distress  Progress to 30 minutes of  aerobic without signs/symptoms of physical distress  Progress to 30 minutes of  aerobic without signs/symptoms of physical distress    Intensity  THRR unchanged  THRR unchanged  THRR unchanged  THRR unchanged      Progression   Progression   Continue to progress workloads to maintain intensity without signs/symptoms of physical distress.  Continue to progress workloads to maintain intensity without signs/symptoms of physical distress.  Continue to progress workloads to maintain intensity without signs/symptoms of physical distress.  Continue to progress workloads to maintain intensity without signs/symptoms of physical distress.    Average METs  1.6  1.9  2.2  2.5      Resistance Training   Training Prescription  Yes  Yes  No Relaxation day, no weights.  No Relaxation day, no weights.    Weight  2lbs  3lbs  -  -    Reps  10-15  10-15  -  -    Time  10 Minutes  10 Minutes  -  -  Interval Training   Interval Training  No  No  No  No      Treadmill   MPH  -  -  1.8  -    Grade  -  -  0  -    Minutes  -  -  15  -    METs  -  -  2.38  -      NuStep   Level  2  3  3  4     SPM  75  75  75  75    Minutes  15  15  15  15     METs  1.6  1.9  2  2.2      Arm Ergometer   Level  2  1.5  -  -    Minutes  15  15  -  -    METs  1.7  1.9  -  -      Track   Laps  -  -  -  4.5 927 ft    Minutes  -  -  -  6 walk test, seated rest x 1.6 minutes    METs  -  -  -  2.75      Home Exercise Plan   Plans to continue exercise at  -  Home (comment) Walking  Home (comment) Walking  Home (comment) Walking    Frequency  -  Add 2 additional days to program exercise sessions.  Add 2 additional days to program exercise sessions.  Add 2 additional days to program exercise sessions.    Initial Home Exercises Provided  -  06/10/19  06/10/19  06/10/19       Exercise Comments: Exercise Comments    Row Name 05/27/19 1131 06/10/19 1112 06/12/19 1115 06/26/19 1148     Exercise Comments  Off to a good start with exercise, tolerated low intensity exercise fair with some bilateral knee pain.  Reviewed home exercise guidelines and goals with patient.  Reviewed METs with patient. Oriented patient to treadmill, and pt tolerated well.  Reviewed METs and  goals with patient.       Exercise Goals and Review: Exercise Goals    Row Name 05/23/19 1033             Exercise Goals   Increase Physical Activity  Yes       Intervention  Provide advice, education, support and counseling about physical activity/exercise needs.;Develop an individualized exercise prescription for aerobic and resistive training based on initial evaluation findings, risk stratification, comorbidities and participant's personal goals.       Expected Outcomes  Short Term: Attend rehab on a regular basis to increase amount of physical activity.;Long Term: Add in home exercise to make exercise part of routine and to increase amount of physical activity.;Long Term: Exercising regularly at least 3-5 days a week.       Increase Strength and Stamina  Yes       Intervention  Provide advice, education, support and counseling about physical activity/exercise needs.;Develop an individualized exercise prescription for aerobic and resistive training based on initial evaluation findings, risk stratification, comorbidities and participant's personal goals.       Expected Outcomes  Short Term: Increase workloads from initial exercise prescription for resistance, speed, and METs.;Short Term: Perform resistance training exercises routinely during rehab and add in resistance training at home;Long Term: Improve cardiorespiratory fitness, muscular endurance and strength as measured by increased METs and functional capacity (6MWT)  Able to understand and use rate of perceived exertion (RPE) scale  Yes       Intervention  Provide education and explanation on how to use RPE scale       Expected Outcomes  Short Term: Able to use RPE daily in rehab to express subjective intensity level;Long Term:  Able to use RPE to guide intensity level when exercising independently       Able to understand and use Dyspnea scale  Yes       Intervention  Provide education and explanation on how to use Dyspnea scale        Expected Outcomes  Short Term: Able to use Dyspnea scale daily in rehab to express subjective sense of shortness of breath during exertion;Long Term: Able to use Dyspnea scale to guide intensity level when exercising independently       Knowledge and understanding of Target Heart Rate Range (THRR)  Yes       Intervention  Provide education and explanation of THRR including how the numbers were predicted and where they are located for reference       Expected Outcomes  Short Term: Able to state/look up THRR;Long Term: Able to use THRR to govern intensity when exercising independently;Short Term: Able to use daily as guideline for intensity in rehab       Able to check pulse independently  Yes       Intervention  Provide education and demonstration on how to check pulse in carotid and radial arteries.;Review the importance of being able to check your own pulse for safety during independent exercise       Expected Outcomes  Short Term: Able to explain why pulse checking is important during independent exercise;Long Term: Able to check pulse independently and accurately       Understanding of Exercise Prescription  Yes       Intervention  Provide education, explanation, and written materials on patient's individual exercise prescription       Expected Outcomes  Short Term: Able to explain program exercise prescription;Long Term: Able to explain home exercise prescription to exercise independently          Exercise Goals Re-Evaluation : Exercise Goals Re-Evaluation    Row Name 05/27/19 1131 06/10/19 1112 06/26/19 1148         Exercise Goal Re-Evaluation   Exercise Goals Review  Able to understand and use rate of perceived exertion (RPE) scale;Increase Physical Activity  Able to understand and use rate of perceived exertion (RPE) scale;Increase Physical Activity;Understanding of Exercise Prescription;Knowledge and understanding of Target Heart Rate Range (THRR);Able to check pulse independently   Able to understand and use rate of perceived exertion (RPE) scale;Increase Physical Activity;Understanding of Exercise Prescription;Knowledge and understanding of Target Heart Rate Range (THRR);Able to check pulse independently     Comments  Patient able to understand and use RPE scale appropriately.  Reviewed home exercise guidelines with patient including THRR, RPE scale, and endpoints for exercise. Pt has a wearable heart monitor to check her pulse. Pt is walking 18-30 minutes, 2 days/week in addition to exercise at cardiac rehab. Pt would like to switch to the treadmill next session because she used one previously when exercising at the gym.  Pt's goal is to return to exercise at local gym.  Patient will be completing the cardiac rehab program on Friday, 06/28/2019. Pt has been walking as her mode of home exercise and using 3lb weights at home for her resistance training. Pt is interested in  participating in the [facility] program at the [facility], referral sent. Pt c/o bilatera knee pain and DOE during the 6MWT, total distance less than entry walk test.     Expected Outcomes  Increase workloads as tolerated to help increase strength and stamina.  Patient will exercise 30 minutes, at least 5 days/week to help achieve personal health and fitness goals. Pt will reacclimate to the treadmill.  Patient will continue walking at home and using hand weights as her mode of home exercise. Patient will participate in the PREP program upon completion of the cardiac rehab program.         Discharge Exercise Prescription (Final Exercise Prescription Changes): Exercise Prescription Changes - 06/26/19 1106      Response to Exercise   Blood Pressure (Admit)  114/60    Blood Pressure (Exercise)  140/80    Blood Pressure (Exit)  110/72    Heart Rate (Admit)  82 bpm    Heart Rate (Exercise)  104 bpm    Heart Rate (Exit)  66 bpm    Oxygen Saturation (Exercise)  94 %   room air   Rating of Perceived Exertion (Exercise)   13    Perceived Dyspnea (Exercise)  2    Symptoms  Pt c/o bilateral knee pain and DOE diuring 6MWT.    Duration  Progress to 30 minutes of  aerobic without signs/symptoms of physical distress    Intensity  THRR unchanged      Progression   Progression  Continue to progress workloads to maintain intensity without signs/symptoms of physical distress.    Average METs  2.5      Resistance Training   Training Prescription  No   Relaxation day, no weights.     Interval Training   Interval Training  No      Treadmill   MPH  --    Grade  --    Minutes  --    METs  --      NuStep   Level  4    SPM  75    Minutes  15    METs  2.2      Track   Laps  4.5   927 ft   Minutes  6   walk test, seated rest x 1.6 minutes   METs  2.75      Home Exercise Plan   Plans to continue exercise at  Home (comment)   Walking   Frequency  Add 2 additional days to program exercise sessions.    Initial Home Exercises Provided  06/10/19       Nutrition:  Target Goals: Understanding of nutrition guidelines, daily intake of sodium 1500mg , cholesterol 200mg , calories 30% from fat and 7% or less from saturated fats, daily to have 5 or more servings of fruits and vegetables.  Biometrics: Pre Biometrics - 05/23/19 1033      Pre Biometrics   Height  5\' 2"  (1.575 m)    Weight  112.3 kg    Waist Circumference  46.5 inches    Hip Circumference  56 inches    Waist to Hip Ratio  0.83 %    BMI (Calculated)  45.27    Triceps Skinfold  70 mm    % Body Fat  57.9 %    Grip Strength  28 kg    Flexibility  8 in    Single Leg Stand  3.25 seconds        Nutrition Therapy Plan and Nutrition  Goals:   Nutrition Assessments:   Nutrition Goals Re-Evaluation:   Nutrition Goals Discharge (Final Nutrition Goals Re-Evaluation):   Psychosocial: Target Goals: Acknowledge presence or absence of significant depression and/or stress, maximize coping skills, provide positive support system. Participant  is able to verbalize types and ability to use techniques and skills needed for reducing stress and depression.  Initial Review & Psychosocial Screening: Initial Psych Review & Screening - 05/23/19 1049      Initial Review   Current issues with  History of Depression      Family Dynamics   Good Support System?  Yes   Pamela Thomas has her husband for support     Barriers   Psychosocial barriers to participate in program  The patient should benefit from training in stress management and relaxation.      Screening Interventions   Interventions  Encouraged to exercise       Quality of Life Scores: Quality of Life - 06/26/19 1702      Quality of Life   Select  Quality of Life      Quality of Life Scores   Health/Function Pre  24.8 %    Health/Function Post  22.47 %    Health/Function % Change  -9.4 %    Socioeconomic Pre  29 %    Socioeconomic Post  23 %    Socioeconomic % Change   -20.69 %    Psych/Spiritual Pre  24.25 %    Psych/Spiritual Post  23.29 %    Psych/Spiritual % Change  -3.96 %    Family Pre  30 %    Family Post  24 %    Family % Change  -20 %    GLOBAL Pre  26.3 %    GLOBAL Post  22.97 %    GLOBAL % Change  -12.66 %      Scores of 19 and below usually indicate a poorer quality of life in these areas.  A difference of  2-3 points is a clinically meaningful difference.  A difference of 2-3 points in the total score of the Quality of Life Index has been associated with significant improvement in overall quality of life, self-image, physical symptoms, and general health in studies assessing change in quality of life.  PHQ-9: Recent Review Flowsheet Data    Depression screen [facility] 2/9 05/27/2019 05/23/2019 03/20/2019 02/28/2018 01/24/2018   Decreased Interest 0 1 0 0 0   Down, Depressed, Hopeless 1 0 0 0 0   PHQ - 2 Score 1 1 0 0 0     Interpretation of Total Score  Total Score Depression Severity:  1-4 = Minimal depression, 5-9 = Mild depression, 10-14 = Moderate  depression, 15-19 = Moderately severe depression, 20-27 = Severe depression   Psychosocial Evaluation and Intervention: Psychosocial Evaluation - 05/27/19 1121      Psychosocial Evaluation & Interventions   Interventions  Encouraged to exercise with the program and follow exercise prescription    Comments  Pamela Thomas has a history of depression. She is currently feeling mild isolation from Covid-19 social distancing guidelines but is hopeful that safe social interactions with [facility] staff and peers will help with her mild isolation feelings. She enjoys reading, cooking, and canning.    Expected Outcomes  Pamela Thomas will continue to self monitor mild symptoms of isolation and report any escalation of symptoms to [facility] staff, medical providers, and/or careteam.    Continue Psychosocial Services   Follow up required by staff  Psychosocial Re-Evaluation: Psychosocial Re-Evaluation    [place] Name 06/21/19 1532             Psychosocial Re-Evaluation   Current issues with  Current Depression       Comments  Pamela Thomas is enjoying the social interaction at [facility].  No interventions necessary.       Expected Outcomes  Pamela Thomas will report ability to manage her depression.       Interventions  Stress management education;Relaxation education;Encouraged to attend Cardiac Rehabilitation for the exercise       Continue Psychosocial Services   No Follow up required          Psychosocial Discharge (Final Psychosocial Re-Evaluation): Psychosocial Re-Evaluation - 06/21/19 1532      Psychosocial Re-Evaluation   Current issues with  Current Depression    Comments  Pamela Thomas is enjoying the social interaction at [facility].  No interventions necessary.    Expected Outcomes  Pamela Thomas will report ability to manage her depression.    Interventions  Stress management education;Relaxation education;Encouraged to attend Cardiac Rehabilitation for the exercise    Continue Psychosocial Services   No Follow up required       Vocational  Rehabilitation: Provide vocational rehab assistance to qualifying candidates.   Vocational Rehab Evaluation & Intervention: Vocational Rehab - 05/23/19 1051      Initial Vocational Rehab Evaluation & Intervention   Assessment shows need for Vocational Rehabilitation  No   Pamela Thomas is retired anfd does not need vocational rehab at this time      Education: Education Goals: Education classes will be provided on a weekly basis, covering required topics. Participant will state understanding/return demonstration of topics presented.  Learning Barriers/Preferences: Learning Barriers/Preferences - 05/23/19 1037      Learning Barriers/Preferences   Learning Barriers  None    Learning Preferences  Video;Pictoral       Education Topics: Hypertension, Hypertension Reduction -Define heart disease and high blood pressure. Discus how high blood pressure affects the body and ways to reduce high blood pressure.   Exercise and Your Heart -Discuss why it is important to exercise, the FITT principles of exercise, normal and abnormal responses to exercise, and how to exercise safely.   Angina -Discuss definition of angina, causes of angina, treatment of angina, and how to decrease risk of having angina.   Cardiac Medications -Review what the following cardiac medications are used for, how they affect the body, and side effects that may occur when taking the medications.  Medications include Aspirin, Beta blockers, calcium channel blockers, ACE Inhibitors, angiotensin receptor blockers, diuretics, digoxin, and antihyperlipidemics.   Congestive Heart Failure -Discuss the definition of CHF, how to live with CHF, the signs and symptoms of CHF, and how keep track of weight and sodium intake.   Heart Disease and Intimacy -Discus the effect sexual activity has on the heart, how changes occur during intimacy as we age, and safety during sexual activity.   Smoking Cessation / COPD -Discuss  different methods to quit smoking, the health benefits of quitting smoking, and the definition of COPD.   Nutrition I: Fats -Discuss the types of cholesterol, what cholesterol does to the heart, and how cholesterol levels can be controlled.   Nutrition II: Labels -Discuss the different components of food labels and how to read food label   Heart Parts/Heart Disease and PAD -Discuss the anatomy of the heart, the pathway of blood circulation through the heart, and these are affected by heart disease.   Stress I:  Signs and Symptoms -Discuss the causes of stress, how stress may lead to anxiety and depression, and ways to limit stress.   Stress II: Relaxation -Discuss different types of relaxation techniques to limit stress.   Warning Signs of Stroke / TIA -Discuss definition of a stroke, what the signs and symptoms are of a stroke, and how to identify when someone is having stroke.   Knowledge Questionnaire Score: Knowledge Questionnaire Score - 06/26/19 1703      Knowledge Questionnaire Score   Pre Score  20/24    Post Score  23/24       Core Components/Risk Factors/Patient Goals at Admission: Personal Goals and Risk Factors at Admission - 05/23/19 1051      Core Components/Risk Factors/Patient Goals on Admission    Weight Management  Yes;Obesity;Weight Loss;Weight Maintenance    Intervention  Weight Management: Develop a combined nutrition and exercise program designed to reach desired caloric intake, while maintaining appropriate intake of nutrient and fiber, sodium and fats, and appropriate energy expenditure required for the weight goal.;Weight Management: Provide education and appropriate resources to help participant work on and attain dietary goals.;Weight Management/Obesity: Establish reasonable short term and long term weight goals.;Obesity: Provide education and appropriate resources to help participant work on and attain dietary goals.    Admit Weight  247 lb 9.2 oz  (112.3 kg)    Goal Weight: Long Term  197 lb (89.4 kg)    Expected Outcomes  Short Term: Continue to assess and modify interventions until short term weight is achieved;Long Term: Adherence to nutrition and physical activity/exercise program aimed toward attainment of established weight goal;Weight Maintenance: Understanding of the daily nutrition guidelines, which includes 25-35% calories from fat, 7% or less cal from saturated fats, less than 200mg  cholesterol, less than 1.5gm of sodium, & 5 or more servings of fruits and vegetables daily;Weight Loss: Understanding of general recommendations for a balanced deficit meal plan, which promotes 1-2 lb weight loss per week and includes a negative energy balance of [phone] kcal/d;Understanding recommendations for meals to include 15-35% energy as protein, 25-35% energy from fat, 35-60% energy from carbohydrates, less than 200mg  of dietary cholesterol, 20-35 gm of total fiber daily;Understanding of distribution of calorie intake throughout the day with the consumption of 4-5 meals/snacks    Improve shortness of breath with ADL's  Yes    Intervention  Provide education, individualized exercise plan and daily activity instruction to help decrease symptoms of SOB with activities of daily living.    Expected Outcomes  Short Term: Improve cardiorespiratory fitness to achieve a reduction of symptoms when performing ADLs;Long Term: Be able to perform more ADLs without symptoms or delay the onset of symptoms    Diabetes  Yes    Intervention  Provide education about signs/symptoms and action to take for hypo/hyperglycemia.;Provide education about proper nutrition, including hydration, and aerobic/resistive exercise prescription along with prescribed medications to achieve blood glucose in normal ranges: Fasting glucose 65-99 mg/dL    Expected Outcomes  Short Term: Participant verbalizes understanding of the signs/symptoms and immediate care of hyper/hypoglycemia, proper  foot care and importance of medication, aerobic/resistive exercise and nutrition plan for blood glucose control.;Long Term: Attainment of HbA1C < 7%.    Heart Failure  Yes    Expected Outcomes  Improve functional capacity of life;Short term: Attendance in program 2-3 days a week with increased exercise capacity. Reported lower sodium intake. Reported increased fruit and vegetable intake. Reports medication compliance.;Short term: Daily weights obtained and reported for increase. Utilizing  diuretic protocols set by physician.;Long term: Adoption of self-care skills and reduction of barriers for early signs and symptoms recognition and intervention leading to self-care maintenance.    Hypertension  Yes    Intervention  Provide education on lifestyle modifcations including regular physical activity/exercise, weight management, moderate sodium restriction and increased consumption of fresh fruit, vegetables, and low fat dairy, alcohol moderation, and smoking cessation.;Monitor prescription use compliance.    Expected Outcomes  Short Term: Continued assessment and intervention until BP is < 140/53mm HG in hypertensive participants. < 130/91mm HG in hypertensive participants with diabetes, heart failure or chronic kidney disease.;Long Term: Maintenance of blood pressure at goal levels.    Lipids  Yes    Intervention  Provide education and support for participant on nutrition & aerobic/resistive exercise along with prescribed medications to achieve LDL 70mg , HDL >40mg .    Expected Outcomes  Short Term: Participant states understanding of desired cholesterol values and is compliant with medications prescribed. Participant is following exercise prescription and nutrition guidelines.;Long Term: Cholesterol controlled with medications as prescribed, with individualized exercise RX and with personalized nutrition plan. Value goals: LDL < 70mg , HDL > 40 mg.    Stress  Yes    Intervention  Offer individual and/or small  group education and counseling on adjustment to heart disease, stress management and health-related lifestyle change. Teach and support self-help strategies.;Refer participants experiencing significant psychosocial distress to appropriate mental health specialists for further evaluation and treatment. When possible, include family members and significant others in education/counseling sessions.    Expected Outcomes  Short Term: Participant demonstrates changes in health-related behavior, relaxation and other stress management skills, ability to obtain effective social support, and compliance with psychotropic medications if prescribed.;Long Term: Emotional wellbeing is indicated by absence of clinically significant psychosocial distress or social isolation.       Core Components/Risk Factors/Patient Goals Review:  Goals and Risk Factor Review    Row Name 05/27/19 1127 06/21/19 1533           Core Components/Risk Factors/Patient Goals Review   Personal Goals Review  Weight Management/Obesity;Diabetes;Heart Failure;Improve shortness of breath with ADL's;Hypertension;Lipids;Stress  Weight Management/Obesity;Diabetes;Heart Failure;Improve shortness of breath with ADL's;Hypertension;Lipids;Stress      Review  Patient with multiple with CAD risk factors and eager to participate in CR.  Patient with multiple with CAD risk factors and eager to participate in CR.  Pamela Thomas continues to tolerate exercise well with increased workloads.  She has recently changed her exercise prescription to include the treadmill.  She is enjoying this change as she would like to use the treadmill when she finishes with CR.      Expected Outcomes  Pamela Thomas will continue to participate in CR to modify risk factors, improve nutrition, and other lifestyle modifications such as weight loss. Her personal goals are to loose 50 lbs, increase her mobility, and decrease her shortness of breath.  Pamela Thomas will continue to participate in CR to  modify risk factors, improve nutrition, and other lifestyle modifications such as weight loss.         Core Components/Risk Factors/Patient Goals at Discharge (Final Review):  Goals and Risk Factor Review - 06/21/19 1533      Core Components/Risk Factors/Patient Goals Review   Personal Goals Review  Weight Management/Obesity;Diabetes;Heart Failure;Improve shortness of breath with ADL's;Hypertension;Lipids;Stress    Review  Patient with multiple with CAD risk factors and eager to participate in CR.  Pamela Thomas continues to tolerate exercise well with increased workloads.  She has recently changed her  exercise prescription to include the treadmill.  She is enjoying this change as she would like to use the treadmill when she finishes with CR.    Expected Outcomes  Pamela Thomas will continue to participate in CR to modify risk factors, improve nutrition, and other lifestyle modifications such as weight loss.       ITP Comments: ITP Comments    Row Name 05/23/19 1003 05/27/19 1111 06/21/19 1531       ITP Comments  Dr Fransico Him MD, Medical Director  30 Day ITP Review. Maleeyah began her individual exercise program today in [facility] and tolerated well.  30 Day ITP Review.  Pamela Thomas continues to tolerate exercise well with increased workloads.  She has recently changed her exercise prescription to include the treadmill.  She is enjoying this change as she would like to use the treadmill when she finishes with CR.  She will graduate on 06/28/2019.        Comments: See ITP comments

## 2019-06-28 ENCOUNTER — Encounter (HOSPITAL_COMMUNITY)
Admission: RE | Admit: 2019-06-28 | Discharge: 2019-06-28 | Disposition: A | Discharge status: Home / Self Care | Payer: BC Managed Care – PPO | Source: Ambulatory Visit | Attending: Cardiology | Admitting: Cardiology

## 2019-06-28 ENCOUNTER — Other Ambulatory Visit: Payer: Self-pay

## 2019-06-28 VITALS — BP 124/82 | HR 76 | Temp 97.2°F | Ht 62.0 in | Wt 250.0 lb

## 2019-06-28 DIAGNOSIS — Z951 Presence of aortocoronary bypass graft: Secondary | ICD-10-CM | POA: Diagnosis not present

## 2019-06-28 NOTE — Progress Notes (Signed)
Discharge Progress Report  Patient Details  Name: Pamela Thomas MRN: [id number] Date of Birth: [birth year] Referring Provider:     CARDIAC REHAB PHASE II ORIENTATION from 05/23/2019 in [place]  Referring Provider  Dr. Percival Spanish       Number of Visits: 13  Reason for Discharge:  Patient reached a stable level of exercise.  Smoking History:  Social History   Tobacco Use  Smoking Status Former Smoker  . Quit date: 09/12/1990  . Years since quitting: 28.8  Smokeless Tobacco Never Used    Diagnosis:  08/17/18 S/P CABG x 2  ADL UCSD:   Initial Exercise Prescription: Initial Exercise Prescription - 05/23/19 1000      Date of Initial Exercise RX and Referring Provider   Date  05/23/19    Referring Provider  Dr. Percival Spanish    Expected Discharge Date  06/14/19      NuStep   Level  2    SPM  75    Minutes  15    METs  1.9      Arm Ergometer   Level  1    Watts  20    Minutes  15    METs  1.9      Prescription Details   Frequency (times per week)  3    Duration  Progress to 30 minutes of continuous aerobic without signs/symptoms of physical distress      Intensity   THRR 40-80% of Max Heartrate  63-126    Ratings of Perceived Exertion  11-13    Perceived Dyspnea  0-4      Progression   Progression  Continue to progress workloads to maintain intensity without signs/symptoms of physical distress.      Resistance Training   Training Prescription  Yes    Weight  2lbs    Reps  10-15       Discharge Exercise Prescription (Final Exercise Prescription Changes): Exercise Prescription Changes - 06/28/19 1104      Response to Exercise   Blood Pressure (Admit)  124/82    Blood Pressure (Exercise)  124/80    Blood Pressure (Exit)  98/68    Heart Rate (Admit)  76 bpm    Heart Rate (Exercise)  88 bpm    Heart Rate (Exit)  70 bpm    Oxygen Saturation (Exercise)  --    Rating of Perceived Exertion (Exercise)  12    Perceived  Dyspnea (Exercise)  2    Symptoms  --    Duration  Progress to 30 minutes of  aerobic without signs/symptoms of physical distress    Intensity  THRR unchanged      Progression   Progression  Continue to progress workloads to maintain intensity without signs/symptoms of physical distress.    Average METs  2      Resistance Training   Training Prescription  Yes    Weight  3lbs    Reps  10-15    Time  10 Minutes      Interval Training   Interval Training  No      NuStep   Level  4    SPM  75    Minutes  20    METs  2      Track   Laps  --    Minutes  --    METs  --      Home Exercise Plan   Plans to continue exercise at  Home (  comment)   Walking   Frequency  Add 2 additional days to program exercise sessions.    Initial Home Exercises Provided  06/10/19       Functional Capacity: 6 Minute Walk    Row Name 05/23/19 1026 06/26/19 1109       6 Minute Walk   Phase  Initial  Discharge    Distance  1088 feet  927 feet    Distance % Change  -  14.8 %    Walk Time  6 minutes  6 minutes    # of Rest Breaks  1 19 sec. rest break.  1 Seated rest break: 1 minute 36 seconds    MPH  2  2.39    METS  2.1  1.74    RPE  12  13    Perceived Dyspnea   2  2    VO2 Peak  7.58  6.1    Symptoms  Yes (comment)  Yes (comment)    Comments  SOB-2, muscle fatigue  Bilateral knee pain, 4/10 on the pain scale. DOE 2/4.    Resting HR  82 bpm  82 bpm    Resting BP  118/68  114/60    Resting Oxygen Saturation   98 %  -    Exercise Oxygen Saturation  during 6 min walk  85 %  94 % room air    Max Ex. HR  116 bpm  104 bpm    Max Ex. BP  140/74  140/80    2 Minute Post BP  112/68  110/72       Psychological, QOL, Others - Outcomes: PHQ 2/9: Depression screen [facility] 2/9 06/28/2019 05/27/2019 05/23/2019 03/20/2019 02/28/2018  Decreased Interest 0 0 1 0 0  Down, Depressed, Hopeless 0 1 0 0 0  PHQ - 2 Score 0 1 1 0 0  Some recent data might be hidden    Quality of Life: Quality of Life -  06/26/19 1702      Quality of Life   Select  Quality of Life      Quality of Life Scores   Health/Function Pre  24.8 %    Health/Function Post  22.47 %    Health/Function % Change  -9.4 %    Socioeconomic Pre  29 %    Socioeconomic Post  23 %    Socioeconomic % Change   -20.69 %    Psych/Spiritual Pre  24.25 %    Psych/Spiritual Post  23.29 %    Psych/Spiritual % Change  -3.96 %    Family Pre  30 %    Family Post  24 %    Family % Change  -20 %    GLOBAL Pre  26.3 %    GLOBAL Post  22.97 %    GLOBAL % Change  -12.66 %       Personal Goals: Goals established at orientation with interventions provided to work toward goal. Personal Goals and Risk Factors at Admission - 05/23/19 1051      Core Components/Risk Factors/Patient Goals on Admission    Weight Management  Yes;Obesity;Weight Loss;Weight Maintenance    Intervention  Weight Management: Develop a combined nutrition and exercise program designed to reach desired caloric intake, while maintaining appropriate intake of nutrient and fiber, sodium and fats, and appropriate energy expenditure required for the weight goal.;Weight Management: Provide education and appropriate resources to help participant work on and attain dietary goals.;Weight Management/Obesity: Establish reasonable short term and long  term weight goals.;Obesity: Provide education and appropriate resources to help participant work on and attain dietary goals.    Admit Weight  247 lb 9.2 oz (112.3 kg)    Goal Weight: Long Term  197 lb (89.4 kg)    Expected Outcomes  Short Term: Continue to assess and modify interventions until short term weight is achieved;Long Term: Adherence to nutrition and physical activity/exercise program aimed toward attainment of established weight goal;Weight Maintenance: Understanding of the daily nutrition guidelines, which includes 25-35% calories from fat, 7% or less cal from saturated fats, less than 200mg  cholesterol, less than 1.5gm of  sodium, & 5 or more servings of fruits and vegetables daily;Weight Loss: Understanding of general recommendations for a balanced deficit meal plan, which promotes 1-2 lb weight loss per week and includes a negative energy balance of [phone] kcal/d;Understanding recommendations for meals to include 15-35% energy as protein, 25-35% energy from fat, 35-60% energy from carbohydrates, less than 200mg  of dietary cholesterol, 20-35 gm of total fiber daily;Understanding of distribution of calorie intake throughout the day with the consumption of 4-5 meals/snacks    Improve shortness of breath with ADL's  Yes    Intervention  Provide education, individualized exercise plan and daily activity instruction to help decrease symptoms of SOB with activities of daily living.    Expected Outcomes  Short Term: Improve cardiorespiratory fitness to achieve a reduction of symptoms when performing ADLs;Long Term: Be able to perform more ADLs without symptoms or delay the onset of symptoms    Diabetes  Yes    Intervention  Provide education about signs/symptoms and action to take for hypo/hyperglycemia.;Provide education about proper nutrition, including hydration, and aerobic/resistive exercise prescription along with prescribed medications to achieve blood glucose in normal ranges: Fasting glucose 65-99 mg/dL    Expected Outcomes  Short Term: Participant verbalizes understanding of the signs/symptoms and immediate care of hyper/hypoglycemia, proper foot care and importance of medication, aerobic/resistive exercise and nutrition plan for blood glucose control.;Long Term: Attainment of HbA1C < 7%.    Heart Failure  Yes    Expected Outcomes  Improve functional capacity of life;Short term: Attendance in program 2-3 days a week with increased exercise capacity. Reported lower sodium intake. Reported increased fruit and vegetable intake. Reports medication compliance.;Short term: Daily weights obtained and reported for increase.  Utilizing diuretic protocols set by physician.;Long term: Adoption of self-care skills and reduction of barriers for early signs and symptoms recognition and intervention leading to self-care maintenance.    Hypertension  Yes    Intervention  Provide education on lifestyle modifcations including regular physical activity/exercise, weight management, moderate sodium restriction and increased consumption of fresh fruit, vegetables, and low fat dairy, alcohol moderation, and smoking cessation.;Monitor prescription use compliance.    Expected Outcomes  Short Term: Continued assessment and intervention until BP is < 140/62mm HG in hypertensive participants. < 130/61mm HG in hypertensive participants with diabetes, heart failure or chronic kidney disease.;Long Term: Maintenance of blood pressure at goal levels.    Lipids  Yes    Intervention  Provide education and support for participant on nutrition & aerobic/resistive exercise along with prescribed medications to achieve LDL 70mg , HDL >40mg .    Expected Outcomes  Short Term: Participant states understanding of desired cholesterol values and is compliant with medications prescribed. Participant is following exercise prescription and nutrition guidelines.;Long Term: Cholesterol controlled with medications as prescribed, with individualized exercise RX and with personalized nutrition plan. Value goals: LDL < 70mg , HDL > 40 mg.  Stress  Yes    Intervention  Offer individual and/or small group education and counseling on adjustment to heart disease, stress management and health-related lifestyle change. Teach and support self-help strategies.;Refer participants experiencing significant psychosocial distress to appropriate mental health specialists for further evaluation and treatment. When possible, include family members and significant others in education/counseling sessions.    Expected Outcomes  Short Term: Participant demonstrates changes in health-related  behavior, relaxation and other stress management skills, ability to obtain effective social support, and compliance with psychotropic medications if prescribed.;Long Term: Emotional wellbeing is indicated by absence of clinically significant psychosocial distress or social isolation.        Personal Goals Discharge: Goals and Risk Factor Review    Row Name 05/27/19 1127 06/21/19 1533 06/28/19 1618         Core Components/Risk Factors/Patient Goals Review   Personal Goals Review  Weight Management/Obesity;Diabetes;Heart Failure;Improve shortness of breath with ADL's;Hypertension;Lipids;Stress  Weight Management/Obesity;Diabetes;Heart Failure;Improve shortness of breath with ADL's;Hypertension;Lipids;Stress  Weight Management/Obesity;Diabetes;Heart Failure;Improve shortness of breath with ADL's;Hypertension;Lipids;Stress     Review  Patient with multiple with CAD risk factors and eager to participate in CR.  Patient with multiple with CAD risk factors and eager to participate in CR.  [name] continues to tolerate exercise well with increased workloads.  She has recently changed her exercise prescription to include the treadmill.  She is enjoying this change as she would like to use the treadmill when she finishes with CR.  Patient with multiple with CAD risk factors and eager to participate in CR.  [name] continues to tolerate exercise well with increased workloads. Cindy plans to walk upon graduation from cardiac rehab.     Expected Outcomes  Akasia will continue to participate in CR to modify risk factors, improve nutrition, and other lifestyle modifications such as weight loss. Her personal goals are to loose 50 lbs, increase her mobility, and decrease her shortness of breath.  Virgia will continue to participate in CR to modify risk factors, improve nutrition, and other lifestyle modifications such as weight loss.  Cindy graduates from cardiac rehab on 06/28/19. Cindy did not lose the weight that she  wanted but plans to enroll in a weight management program through [place].        Exercise Goals and Review: Exercise Goals    Row Name 05/23/19 1033             Exercise Goals   Increase Physical Activity  Yes       Intervention  Provide advice, education, support and counseling about physical activity/exercise needs.;Develop an individualized exercise prescription for aerobic and resistive training based on initial evaluation findings, risk stratification, comorbidities and participant's personal goals.       Expected Outcomes  Short Term: Attend rehab on a regular basis to increase amount of physical activity.;Long Term: Add in home exercise to make exercise part of routine and to increase amount of physical activity.;Long Term: Exercising regularly at least 3-5 days a week.       Increase Strength and Stamina  Yes       Intervention  Provide advice, education, support and counseling about physical activity/exercise needs.;Develop an individualized exercise prescription for aerobic and resistive training based on initial evaluation findings, risk stratification, comorbidities and participant's personal goals.       Expected Outcomes  Short Term: Increase workloads from initial exercise prescription for resistance, speed, and METs.;Short Term: Perform resistance training exercises routinely during rehab and add in resistance training at home;Long  Term: Improve cardiorespiratory fitness, muscular endurance and strength as measured by increased METs and functional capacity (6MWT)       Able to understand and use rate of perceived exertion (RPE) scale  Yes       Intervention  Provide education and explanation on how to use RPE scale       Expected Outcomes  Short Term: Able to use RPE daily in rehab to express subjective intensity level;Long Term:  Able to use RPE to guide intensity level when exercising independently       Able to understand and use Dyspnea scale  Yes       Intervention   Provide education and explanation on how to use Dyspnea scale       Expected Outcomes  Short Term: Able to use Dyspnea scale daily in rehab to express subjective sense of shortness of breath during exertion;Long Term: Able to use Dyspnea scale to guide intensity level when exercising independently       Knowledge and understanding of Target Heart Rate Range (THRR)  Yes       Intervention  Provide education and explanation of THRR including how the numbers were predicted and where they are located for reference       Expected Outcomes  Short Term: Able to state/look up THRR;Long Term: Able to use THRR to govern intensity when exercising independently;Short Term: Able to use daily as guideline for intensity in rehab       Able to check pulse independently  Yes       Intervention  Provide education and demonstration on how to check pulse in carotid and radial arteries.;Review the importance of being able to check your own pulse for safety during independent exercise       Expected Outcomes  Short Term: Able to explain why pulse checking is important during independent exercise;Long Term: Able to check pulse independently and accurately       Understanding of Exercise Prescription  Yes       Intervention  Provide education, explanation, and written materials on patient's individual exercise prescription       Expected Outcomes  Short Term: Able to explain program exercise prescription;Long Term: Able to explain home exercise prescription to exercise independently          Exercise Goals Re-Evaluation: Exercise Goals Re-Evaluation    Row Name 05/27/19 1131 06/10/19 1112 06/26/19 1148 06/28/19 1135       Exercise Goal Re-Evaluation   Exercise Goals Review  Able to understand and use rate of perceived exertion (RPE) scale;Increase Physical Activity  Able to understand and use rate of perceived exertion (RPE) scale;Increase Physical Activity;Understanding of Exercise Prescription;Knowledge and  understanding of Target Heart Rate Range (THRR);Able to check pulse independently  Able to understand and use rate of perceived exertion (RPE) scale;Increase Physical Activity;Understanding of Exercise Prescription;Knowledge and understanding of Target Heart Rate Range (THRR);Able to check pulse independently  Able to understand and use rate of perceived exertion (RPE) scale;Increase Physical Activity;Understanding of Exercise Prescription;Knowledge and understanding of Target Heart Rate Range (THRR);Able to check pulse independently;Increase Strength and Stamina    Comments  Patient able to understand and use RPE scale appropriately.  Reviewed home exercise guidelines with patient including THRR, RPE scale, and endpoints for exercise. Pt has a wearable heart monitor to check her pulse. Pt is walking 18-30 minutes, 2 days/week in addition to exercise at cardiac rehab. Pt would like to switch to the treadmill next session because she used one  previously when exercising at the gym.  Pt's goal is to return to exercise at local gym.  Patient will be completing the cardiac rehab program on Friday, 06/28/2019. Pt has been walking as her mode of home exercise and using 3lb weights at home for her resistance training. Pt is interested in participating in the P.R.E.P program at the [facility], referral sent. Pt c/o bilatera knee pain and DOE during the 6MWT, total distance less than entry walk test.  Patient completed the phase 2 cardiac rehab program and plans to continue exercise walking, 30 minutes, 5 days/week. Pt is very appreciative of her time and the program and states she was glad she was able to complete the program. Pt's strength increased 7% as measured by grip strength test.    Expected Outcomes  Increase workloads as tolerated to help increase strength and stamina.  Patient will exercise 30 minutes, at least 5 days/week to help achieve personal health and fitness goals. Pt will reacclimate to the treadmill.   Patient will continue walking at home and using hand weights as her mode of home exercise. Patient will participate in the PREP program upon completion of the cardiac rehab program.  Patient will continue walking at home and using hand weights as her mode of home exercise. Patient will participate in the PREP program upon completion of the cardiac rehab program.       Nutrition & Weight - Outcomes: Pre Biometrics - 05/23/19 1033      Pre Biometrics   Height  5\' 2"  (1.575 m)    Weight  247 lb 9.2 oz (112.3 kg)    Waist Circumference  46.5 inches    Hip Circumference  56 inches    Waist to Hip Ratio  0.83 %    BMI (Calculated)  45.27    Triceps Skinfold  70 mm    % Body Fat  57.9 %    Grip Strength  28 kg    Flexibility  8 in    Single Leg Stand  3.25 seconds      Post Biometrics - 06/28/19 1104       Post  Biometrics   Height  5\' 2"  (1.575 m)    Weight  250 lb (113.4 kg)    Waist Circumference  46 inches    Hip Circumference  55.5 inches    Waist to Hip Ratio  0.83 %    BMI (Calculated)  45.71    Triceps Skinfold  70 mm    % Body Fat  58 %    Grip Strength  30 kg    Flexibility  10.5 in    Single Leg Stand  3.06 seconds       Nutrition:   Nutrition Discharge:   Education Questionnaire Score: Knowledge Questionnaire Score - 06/26/19 1703      Knowledge Questionnaire Score   Pre Score  20/24    Post Score  23/24       Goals reviewed with patient; copy given to patient. Pt graduated from cardiac rehab program today with completion of 13 exercise sessions in Phase II. Pt maintained good attendance . Cindy's distance decreased slightly during the post walk test and Cindy gained 3 pounds while in the program.   Medication list reconciled. Repeat  PHQ score- 0 .  Pt has made some lifestyle changes and should be commended for her success. Pt feels she has achieved some of her goals during cardiac rehab. Cindy did not lose the weight  that she wanted but plans to enroll in a  weight management program through [place].Cindy plans on walking on her own. Barnet Pall, RN,BSN 07/02/2019 10:41 AM

## 2019-07-05 ENCOUNTER — Other Ambulatory Visit: Payer: Self-pay | Admitting: Internal Medicine

## 2019-07-08 ENCOUNTER — Other Ambulatory Visit: Payer: Self-pay | Admitting: *Deleted

## 2019-07-09 MED ORDER — METOPROLOL SUCCINATE ER 50 MG PO TB24: 50.0000 mg | ORAL_TABLET | Freq: Two times a day (BID) | ORAL | 3 refills | Status: DC

## 2019-07-17 NOTE — Progress Notes (Signed)
Pamela Thomas to begin the 12-week, 2x/wk PREP at [facility] on 11/10 from 2:30-3:45.

## 2019-07-25 DIAGNOSIS — E785 Hyperlipidemia, unspecified: Secondary | ICD-10-CM | POA: Insufficient documentation

## 2019-07-25 DIAGNOSIS — I350 Nonrheumatic aortic (valve) stenosis: Secondary | ICD-10-CM | POA: Insufficient documentation
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
## 2019-07-26 ENCOUNTER — Encounter: Payer: Self-pay | Admitting: Cardiology

## 2019-07-26 ENCOUNTER — Ambulatory Visit: Payer: BC Managed Care – PPO | Admitting: Cardiology

## 2019-07-26 ENCOUNTER — Other Ambulatory Visit: Payer: Self-pay

## 2019-07-26 VITALS — BP 96/58 | HR 68 | Temp 96.8°F | Ht 62.0 in | Wt 252.2 lb

## 2019-07-26 DIAGNOSIS — E785 Hyperlipidemia, unspecified: Secondary | ICD-10-CM

## 2019-07-26 DIAGNOSIS — I2510 Atherosclerotic heart disease of native coronary artery without angina pectoris: Secondary | ICD-10-CM | POA: Diagnosis not present

## 2019-07-26 DIAGNOSIS — I350 Nonrheumatic aortic (valve) stenosis: Secondary | ICD-10-CM

## 2019-07-26 DIAGNOSIS — I2119 ST elevation (STEMI) myocardial infarction involving other coronary artery of inferior wall: Secondary | ICD-10-CM

## 2019-07-26 DIAGNOSIS — I6523 Occlusion and stenosis of bilateral carotid arteries: Secondary | ICD-10-CM

## 2019-07-26 DIAGNOSIS — R0602 Shortness of breath: Secondary | ICD-10-CM

## 2019-07-26 MED ORDER — AMLODIPINE BESYLATE 5 MG PO TABS: 5.0000 mg | ORAL_TABLET | Freq: Every day | ORAL | 3 refills | Status: DC

## 2019-07-26 NOTE — Patient Instructions (Signed)
Medication Instructions:  DECREASE AMLODIPINE TO 5 MG ONCE DAILY= 1/2 OF THE 10 MG TABLET ONCE DAILY  STOP PLAVIX/CLORPIDOGREL IN December  *If you need a refill on your cardiac medications before your next appointment, please call your pharmacy*  Lab Work: If you have labs (blood work) drawn today and your tests are completely normal, you will receive your results only by: Marland Kitchen MyChart Message (if you have MyChart) OR . A paper copy in the mail If you have any lab test that is abnormal or we need to change your treatment, we will call you to review the results.  Testing/Procedures: Your physician has requested that you have a carotid duplex. This test is an ultrasound of the carotid arteries in your neck. It looks at blood flow through these arteries that supply the brain with blood. Allow one hour for this exam. There are no restrictions or special instructions.SCHEDULE IN December     Follow-Up: At [facility], you and your health needs are our priority.  As part of our continuing mission to provide you with exceptional heart care, we have created designated Provider Care Teams.  These Care Teams include your primary Cardiologist (physician) and Advanced Practice Providers (APPs -  Physician Assistants and Nurse Practitioners) who all work together to provide you with the care you need, when you need it.  Your next appointment:   6 months  The format for your next appointment:   In Person  Provider:   Minus Breeding, MD  Other Instructions GET Pamela Thomas

## 2019-07-30 ENCOUNTER — Other Ambulatory Visit: Payer: Self-pay | Admitting: Cardiology

## 2019-08-01 NOTE — Progress Notes (Signed)
[facility] [employer] PREP Weekly Session   Patient Details  Name: Pamela Thomas MRN: [id number] Date of Birth: September 06, 1956 Age: 63 y.o. PCP: Dickie La, MD  There were no vitals filed for this visit.  Cardio: 2 minute march: 153 30 second chair stand: 12 RT arm 5 lb curl: 17  Balance: Double: 0 Single: 6 Tandem: 1   Vanita Ingles 08/01/2019, 9:15 PM

## 2019-08-07 NOTE — Progress Notes (Signed)
[facility] [employer] PREP Weekly Session   Patient Details  Name: Pamela Thomas MRN: [id number] Date of Birth: 03/19/1956 Age: 63 y.o. PCP: Dickie La, MD  Vitals:   08/07/19 1201  BP: 136/66  Weight: 252 lb 9.6 oz (114.6 kg)    [facility] [employer] Weekly seesion - 08/07/19 1200      Weekly Session   Topic Discussed  Eating for the season    Minutes exercised this week  --   Cardio 60, Strength 20, No much flexibility      Fun Things you did since last mtg: Spent extra time with my granddaughter Things you are grateful for: Starting a new regimen, family, faith friends Nutrition Celebrations: Some poor choices, some good choices Barriers: Skipping meals  Communicated that she did not feel well. C/O Headache. BP checked 136/66. Sts med recently changed (Amlodipine decreased) still on anticoagulant. Unable to take NSAIDs hasn't taken any meds. Uses peppermint oil usually. Comments that she had SOB. Noted to be almost hyperventilating. Admits to being anxious and overwelmed. Calmed with 2 sessions of breathwork. Chose to leave before cardio session to go get relief from headache. Encouraged she take some time to care for herself.    Barnett Hatter 08/07/2019, 12:04 PM

## 2019-08-07 NOTE — Progress Notes (Signed)
[facility] [employer] PREP Weekly Session   Patient Details  Name: MANASA SPEASE MRN: [id number] Date of Birth: March 08, 1956 Age: 63 y.o. PCP: Dickie La, MD  Vitals:   07/30/19 1500  Weight: 253 lb 9.6 oz (115 kg)    [facility] [employer] Weekly seesion - 08/07/19 1600      Weekly Session   Topic Discussed  Healthy eating tips;Health habits    Minutes exercised this week  210 minutes   210 min of cardio. No strength training noted, no flexiblity    Fun things you did since last meeting: Family time Things you are grateful for: Faith and family health Nutrition celebration for the week: Increased water intake and lowered carbs    Pam Tally Joe 08/07/2019, 4:42 PM

## 2019-08-14 NOTE — Progress Notes (Signed)
[facility] [employer] PREP Weekly Session   Patient Details  Name: Pamela Thomas MRN: [id number] Date of Birth: 1956/04/13 Age: 63 y.o. PCP: Dickie La, MD  There were no vitals filed for this visit.   Called to let us know she "over did it yesterday" but will see [name] at class on Thursday.  Vanita Ingles 08/14/2019, 11:59 AM

## 2019-08-15 ENCOUNTER — Other Ambulatory Visit: Payer: Self-pay | Admitting: Family Medicine

## 2019-08-15 DIAGNOSIS — Z1231 Encounter for screening mammogram for malignant neoplasm of breast: Secondary | ICD-10-CM

## 2019-08-26 ENCOUNTER — Ambulatory Visit (HOSPITAL_COMMUNITY): Payer: BC Managed Care – PPO

## 2019-08-27 ENCOUNTER — Other Ambulatory Visit: Payer: Self-pay | Admitting: Family Medicine

## 2019-08-29 ENCOUNTER — Other Ambulatory Visit: Payer: Self-pay | Admitting: Internal Medicine

## 2019-09-04 ENCOUNTER — Ambulatory Visit: Payer: BC Managed Care – PPO | Attending: Internal Medicine

## 2019-09-04 DIAGNOSIS — Z20828 Contact with and (suspected) exposure to other viral communicable diseases: Secondary | ICD-10-CM | POA: Diagnosis not present

## 2019-09-04 DIAGNOSIS — Z20822 Contact with and (suspected) exposure to covid-19: Secondary | ICD-10-CM

## 2019-09-05 LAB — NOVEL CORONAVIRUS, NAA: SARS-CoV-2, NAA: DETECTED — AB

## 2019-09-24 ENCOUNTER — Encounter (HOSPITAL_COMMUNITY): Payer: BC Managed Care – PPO

## 2019-09-26 ENCOUNTER — Other Ambulatory Visit: Payer: Self-pay

## 2019-09-27 MED ORDER — METFORMIN HCL 1000 MG PO TABS: 1000.0000 mg | ORAL_TABLET | Freq: Two times a day (BID) | ORAL | 3 refills | Status: DC

## 2019-10-01 ENCOUNTER — Ambulatory Visit (HOSPITAL_COMMUNITY)
Admission: RE | Admit: 2019-10-01 | Discharge: 2019-10-01 | Disposition: A | Discharge status: Home / Self Care | Payer: BC Managed Care – PPO | Source: Ambulatory Visit | Attending: Cardiovascular Disease | Admitting: Cardiovascular Disease

## 2019-10-01 ENCOUNTER — Other Ambulatory Visit: Payer: Self-pay | Admitting: Cardiology

## 2019-10-01 ENCOUNTER — Telehealth: Payer: Self-pay

## 2019-10-01 ENCOUNTER — Other Ambulatory Visit: Payer: Self-pay

## 2019-10-01 DIAGNOSIS — I6523 Occlusion and stenosis of bilateral carotid arteries: Secondary | ICD-10-CM

## 2019-10-01 NOTE — Telephone Encounter (Signed)
Attempted prior auth for Vascepa through Covermymeds. Covermymeds states medication is available without authorization.

## 2019-10-04 ENCOUNTER — Ambulatory Visit
Admission: RE | Admit: 2019-10-04 | Discharge: 2019-10-04 | Disposition: A | Discharge status: Home / Self Care | Payer: BC Managed Care – PPO | Source: Ambulatory Visit | Attending: Family Medicine | Admitting: Family Medicine

## 2019-10-04 ENCOUNTER — Other Ambulatory Visit: Payer: Self-pay

## 2019-10-04 DIAGNOSIS — Z1231 Encounter for screening mammogram for malignant neoplasm of breast: Secondary | ICD-10-CM | POA: Diagnosis not present

## 2019-10-04 NOTE — Progress Notes (Signed)
[employer] membership good til 10/25/2019 Will meet in person for final fit testing on 2/9 and 2/11.  Encouraged to utilize [employer] and attend online classes also.

## 2019-10-07 ENCOUNTER — Other Ambulatory Visit: Payer: Self-pay | Admitting: *Deleted

## 2019-10-07 MED ORDER — ASPIRIN EC 81 MG PO TBEC: 81.0000 mg | DELAYED_RELEASE_TABLET | Freq: Every day | ORAL | 3 refills | Status: DC

## 2019-10-14 ENCOUNTER — Encounter (INDEPENDENT_AMBULATORY_CARE_PROVIDER_SITE_OTHER): Payer: Self-pay | Admitting: Bariatrics

## 2019-10-14 ENCOUNTER — Other Ambulatory Visit: Payer: Self-pay

## 2019-10-14 ENCOUNTER — Ambulatory Visit (INDEPENDENT_AMBULATORY_CARE_PROVIDER_SITE_OTHER): Payer: BC Managed Care – PPO | Admitting: Bariatrics

## 2019-10-14 VITALS — BP 119/70 | HR 67 | Temp 98.0°F | Ht 61.0 in | Wt 246.0 lb

## 2019-10-14 DIAGNOSIS — E559 Vitamin D deficiency, unspecified: Secondary | ICD-10-CM | POA: Diagnosis not present

## 2019-10-14 DIAGNOSIS — E7849 Other hyperlipidemia: Secondary | ICD-10-CM

## 2019-10-14 DIAGNOSIS — Z0289 Encounter for other administrative examinations: Secondary | ICD-10-CM

## 2019-10-14 DIAGNOSIS — Z1331 Encounter for screening for depression: Secondary | ICD-10-CM

## 2019-10-14 DIAGNOSIS — E119 Type 2 diabetes mellitus without complications: Secondary | ICD-10-CM | POA: Diagnosis not present

## 2019-10-14 DIAGNOSIS — Z9189 Other specified personal risk factors, not elsewhere classified: Secondary | ICD-10-CM | POA: Diagnosis not present

## 2019-10-14 DIAGNOSIS — R0602 Shortness of breath: Secondary | ICD-10-CM | POA: Diagnosis not present

## 2019-10-14 DIAGNOSIS — I25118 Atherosclerotic heart disease of native coronary artery with other forms of angina pectoris: Secondary | ICD-10-CM

## 2019-10-14 DIAGNOSIS — Z6841 Body Mass Index (BMI) 40.0 and over, adult: Secondary | ICD-10-CM

## 2019-10-14 DIAGNOSIS — I10 Essential (primary) hypertension: Secondary | ICD-10-CM

## 2019-10-14 DIAGNOSIS — R5383 Other fatigue: Secondary | ICD-10-CM | POA: Diagnosis not present

## 2019-10-14 NOTE — Progress Notes (Signed)
Dear Dr. Dorcas Thomas,   Thank you for referring Pamela Thomas to our clinic. The following note includes my evaluation and treatment recommendations.  Chief Complaint:   OBESITY Pamela Thomas (MR# [id number]) is a 64 y.o. female who presents for evaluation and treatment of obesity and related comorbidities. Current BMI is Body mass index is 46.48 kg/m.Marland Kitchen Pamela Thomas has been struggling with her weight for many years and has been unsuccessful in either losing weight, maintaining weight loss, or reaching her healthy weight goal.  Pamela Thomas is currently in the action stage of change and ready to dedicate time achieving and maintaining a healthier weight. Pamela Thomas is interested in becoming our patient and working on intensive lifestyle modifications including (but not limited to) diet and exercise for weight loss.  Pamela Thomas likes to [employer], but notes obstacles to cooking. She craves sweets. She has cravings after dinner. She skips meals.  Pamela Thomas's habits were reviewed today and are as follows: Her family eats meals together, she thinks her family will eat healthier with her, her desired weight loss is 76 lbs, she has been heavy most of her life, she started gaining weight in the last 10 years, her heaviest weight ever was 252 pounds, she craves sweets, she snacks frequently in the evenings, she skips meals frequently, she frequently makes poor food choices, she frequently eats larger portions than normal, she has binge eating behaviors and she struggles with emotional eating.  Depression Screen Pamela Thomas's Food and Mood (modified PHQ-9) score was 18.  Depression screen PHQ 2/9 10/14/2019  Decreased Interest 3  Down, Depressed, Hopeless 1  PHQ - 2 Score 4  Altered sleeping 2  Tired, decreased energy 3  Change in appetite 3  Feeling bad or failure about yourself  3  Trouble concentrating 1  Moving slowly or fidgety/restless 2  Suicidal thoughts 0  PHQ-9 Score 18  Difficult doing  work/chores Somewhat difficult  Some recent data might be hidden   Subjective:   Other fatigue. Pamela Thomas denies daytime somnolence and admits to waking up still tired. Pamela Thomas has a history of symptoms of morning headache. Pamela Thomas generally gets 7-9 hours of sleep per night, and states that she has generally restful sleep. Snoring is present. Apneic episodes are not present. Epworth Sleepiness Score is 8.  SOB (shortness of breath) on exertion. Pamela Thomas notes increasing shortness of breath with certain activities and seems to be worsening over time with weight gain. She notes getting out of breath sooner with activity than she used to. This has gotten worse recently. Pamela Thomas denies shortness of breath at rest or orthopnea.  Vitamin D deficiency. Pamela Thomas is not taking Vitamin D supplementation.  Other hyperlipidemia. Pamela Thomas is taking Lipitor and Vascepa. She has elevated triglycerides.  Lab Results  Component Value Date   CHOL 158 06/07/2019   HDL 34 (L) 06/07/2019   LDLCALC 78 06/07/2019   LDLDIRECT 63 01/14/2019   TRIG 281 (H) 06/07/2019   CHOLHDL 4.6 (H) 06/07/2019   Lab Results  Component Value Date   ALT 12 03/20/2019   AST 17 03/20/2019   ALKPHOS 77 03/20/2019   BILITOT 0.2 03/20/2019   The ASCVD Risk score Pamela Bussing DC Jr., et al., 2013) failed to calculate for the following reasons:   The patient has a prior MI or stroke diagnosis  Type 2 diabetes mellitus without complication, without long-term current use of insulin (Pamela Thomas). Pamela Thomas is taking metformin. She states fasting blood sugars range between 150 and 170's.  Lab Results  Component Value Date   HGBA1C 7.1 (A) 03/20/2019   HGBA1C 7.2 (A) 01/30/2019   HGBA1C 6.2 (H) 08/13/2018   Lab Results  Component Value Date   LDLCALC 78 06/07/2019   CREATININE 0.95 03/20/2019   No results found for: INSULIN  Coronary artery disease of native artery of native heart with stable angina pectoris (Pamela Thomas.) Pamela Thomas had diastolic  congestive heart failure and is status post CABG x1 on 08/17/2018. She underwent echo on 03/25/2019 and follows with her cardiologist.  Essential hypertension. Pamela Thomas takes Norvasc and Atacand. Blood pressure today is 119/70.  BP Readings from Last 3 Encounters:  10/14/19 119/70  08/07/19 136/66  07/26/19 (!) 96/58   Lab Results  Component Value Date   CREATININE 0.95 03/20/2019   CREATININE 0.78 08/25/2018   CREATININE 0.89 08/24/2018   Depression screening. Pamela Thomas had a strongly positive depression screening with a PHQ-9 score of 18.  At risk for hypoglycemia. Pamela Thomas is at increased risk for hypoglycemia due to changes in diet and diabetes mellitus. Pamela Thomas is not currently taking insulin.   Assessment/Plan:   Other fatigue. Pamela Thomas does feel that her weight is causing her energy to be lower than it should be. Fatigue may be related to obesity, depression or many other causes. Labs will be ordered, and in the meanwhile, Pamela Thomas will focus on self care including making healthy food choices, increasing physical activity and focusing on stress reduction.  EKG 12-Lead, TSH, T4, free, T3  SOB (shortness of breath) on exertion. Pamela Thomas does feel that she gets out of breath more easily that she used to when she exercises. Pamela Thomas shortness of breath appears to be obesity related and exercise induced. She has agreed to work on weight loss and gradually increase exercise to treat her exercise induced shortness of breath. Will continue to monitor closely.  Vitamin D deficiency. Low Vitamin D level contributes to fatigue and are associated with obesity, breast, and colon cancer. She will have routine VITAMIN D 25 Hydroxy (Vit-D Deficiency, Fractures) level checked.  Other hyperlipidemia. Cardiovascular risk and specific lipid/LDL goals reviewed.  We discussed several lifestyle modifications today and Pamela Thomas will continue to work on diet, exercise and weight loss efforts. Orders and follow up  as documented in patient record. She will continue her medications as prescribed. Lipid Panel With LDL/HDL Ratio ordered.  Counseling Intensive lifestyle modifications are the first line treatment for this issue. . Dietary changes: Increase soluble fiber. Decrease simple carbohydrates. . Exercise changes: Moderate to vigorous-intensity aerobic activity 150 minutes per week if tolerated. . Lipid-lowering medications: see documented in medical record.   Type 2 diabetes mellitus without complication, without long-term current use of insulin ([place]). Good blood sugar control is important to decrease the likelihood of diabetic complications such as nephropathy, neuropathy, limb loss, blindness, coronary artery disease, and death. Intensive lifestyle modification including diet, exercise and weight loss are the first line of treatment for diabetes. Comprehensive metabolic panel, Hemoglobin A1c, Insulin, random, Microalbumin / creatinine urine ratio labs ordered.  Coronary artery disease of native artery of native heart with stable angina pectoris ([place]). Pamela Thomas will follow-up with her cardiologist every 6 months. We reviewed her echo which showed an ejection fraction greater than 65%.  Essential hypertension. Pamela Thomas is working on healthy weight loss and exercise to improve blood pressure control. We will watch for signs of hypotension as she continues her lifestyle modifications. She will continue her medications as prescribed.  Depression screening. Pamela Thomas had a positive depression screening. Depression is commonly associated  with obesity and often results in emotional eating behaviors. We will monitor this closely and work on CBT to help improve the non-hunger eating patterns. Referral to Psychology may be required if no improvement is seen as she continues in our clinic.  At risk for hypoglycemia. Pamela Thomas was given approximately 15 minutes of counseling today regarding prevention of hypoglycemia. She  was advised of symptoms of hypoglycemia. Pamela Thomas was instructed to eat regular meals.   Class 3 severe obesity with serious comorbidity and body mass index (BMI) of 45.0 to 49.9 in adult, unspecified obesity type ([place]).  Pamela Thomas is currently in the action stage of change and her goal is to continue with weight loss efforts. I recommend Inice begin the structured treatment plan as follows:  She has agreed to the Category 3 Plan + 100 calories.  We independently reviewed labs from 06/07/2019 including lipids (not controlled 03/20/2019), BNP, CMP, CBC, glucose, and TSH.  She will work on meal planning and increasing her water intake.  Exercise goals: All adults should avoid inactivity. Some physical activity is better than none, and adults who participate in any amount of physical activity gain some health benefits.   Behavioral modification strategies: increasing lean protein intake, decreasing simple carbohydrates, increasing vegetables, increasing water intake, decreasing eating out, no skipping meals, meal planning and cooking strategies and keeping healthy foods in the home.  She was informed of the importance of frequent follow-up visits to maximize her success with intensive lifestyle modifications for her multiple health conditions. She was informed we would discuss her lab results at her next visit unless there is a critical issue that needs to be addressed sooner. Kemari agreed to keep her next visit at the agreed upon time to discuss these results.  Objective:   Blood pressure 119/70, pulse 67, temperature 98 F (36.7 C), height 5\' 1"  (1.549 m), weight 246 lb (111.6 kg), last menstrual period 10/10/2010, SpO2 93 %. Body mass index is 46.48 kg/m.  EKG: Sinus  Rhythm with a rate of 63 BPM. Low voltage in precordial leads. Left atrial enlargement. Anteroseptal infarct - age undetermined. No acute changes. Known history of CAD.  Indirect Calorimeter completed today shows a VO2 of 316  and a REE of 2197.  Her calculated basal metabolic rate is 2505 thus her basal metabolic rate is better than expected.  General: Cooperative, alert, well developed, in no acute distress. HEENT: Conjunctivae and lids unremarkable. Cardiovascular: Regular rhythm.  Lungs: Normal work of breathing. Neurologic: No focal deficits.   Lab Results  Component Value Date   CREATININE 0.95 03/20/2019   BUN 15 03/20/2019   NA 141 03/20/2019   K 4.2 03/20/2019   CL 99 03/20/2019   CO2 25 03/20/2019   Lab Results  Component Value Date   ALT 12 03/20/2019   AST 17 03/20/2019   ALKPHOS 77 03/20/2019   BILITOT 0.2 03/20/2019   Lab Results  Component Value Date   HGBA1C 7.1 (A) 03/20/2019   HGBA1C 7.2 (A) 01/30/2019   HGBA1C 6.2 (H) 08/13/2018   HGBA1C 6.9 01/12/2018   HGBA1C 7.0 07/27/2016   No results found for: INSULIN Lab Results  Component Value Date   TSH 2.890 03/20/2019   Lab Results  Component Value Date   CHOL 158 06/07/2019   HDL 34 (L) 06/07/2019   LDLCALC 78 06/07/2019   LDLDIRECT 63 01/14/2019   TRIG 281 (H) 06/07/2019   CHOLHDL 4.6 (H) 06/07/2019   Lab Results  Component Value Date  WBC 8.9 03/20/2019   HGB 12.0 03/20/2019   HCT 36.2 03/20/2019   MCV 79 03/20/2019   PLT 280 03/20/2019   No results found for: IRON, TIBC, FERRITIN  Attestation Statements:   Reviewed by clinician on day of visit: allergies, medications, problem list, medical history, surgical history, family history, social history, and previous encounter notes.  Migdalia Dk, am acting as Location manager for [employer], DO   I have reviewed the above documentation for accuracy and completeness, and I agree with the above. Jearld Lesch, DO

## 2019-10-15 ENCOUNTER — Encounter (INDEPENDENT_AMBULATORY_CARE_PROVIDER_SITE_OTHER): Payer: Self-pay | Admitting: Bariatrics

## 2019-10-15 LAB — COMPREHENSIVE METABOLIC PANEL
ALT: 13 IU/L (ref 0–32)
AST: 13 IU/L (ref 0–40)
Albumin/Globulin Ratio: 1.7 (ref 1.2–2.2)
Albumin: 4.4 g/dL (ref 3.8–4.8)
Alkaline Phosphatase: 80 IU/L (ref 39–117)
BUN/Creatinine Ratio: 25 (ref 12–28)
BUN: 27 mg/dL (ref 8–27)
Bilirubin Total: 0.4 mg/dL (ref 0.0–1.2)
CO2: 26 mmol/L (ref 20–29)
Calcium: 9.4 mg/dL (ref 8.7–10.3)
Chloride: 96 mmol/L (ref 96–106)
Creatinine, Ser: 1.06 mg/dL — ABNORMAL HIGH (ref 0.57–1.00)
GFR calc Af Amer: 64 mL/min/{1.73_m2} (ref >59)
GFR calc non Af Amer: 56 mL/min/{1.73_m2} — ABNORMAL LOW (ref >59)
Globulin, Total: 2.6 g/dL (ref 1.5–4.5)
Glucose: 124 mg/dL — ABNORMAL HIGH (ref 65–99)
Potassium: 4.1 mmol/L (ref 3.5–5.2)
Sodium: 141 mmol/L (ref 134–144)
Total Protein: 7 g/dL (ref 6.0–8.5)

## 2019-10-15 LAB — TSH: TSH: 1.36 u[IU]/mL (ref 0.450–4.500)

## 2019-10-15 LAB — LIPID PANEL WITH LDL/HDL RATIO
Cholesterol, Total: 165 mg/dL (ref 100–199)
HDL: 36 mg/dL — ABNORMAL LOW (ref >39)
LDL Chol Calc (NIH): 77 mg/dL (ref 0–99)
LDL/HDL Ratio: 2.1 ratio (ref 0.0–3.2)
Triglycerides: 319 mg/dL — ABNORMAL HIGH (ref 0–149)
VLDL Cholesterol Cal: 52 mg/dL — ABNORMAL HIGH (ref 5–40)

## 2019-10-15 LAB — T3: T3, Total: 204 ng/dL — ABNORMAL HIGH (ref 71–180)

## 2019-10-15 LAB — HEMOGLOBIN A1C
Est. average glucose Bld gHb Est-mCnc: 160 mg/dL
Hgb A1c MFr Bld: 7.2 % — ABNORMAL HIGH (ref 4.8–5.6)

## 2019-10-15 LAB — INSULIN, RANDOM: INSULIN: 8.7 u[IU]/mL (ref 2.6–24.9)

## 2019-10-15 LAB — VITAMIN D 25 HYDROXY (VIT D DEFICIENCY, FRACTURES): Vit D, 25-Hydroxy: 20.3 ng/mL — ABNORMAL LOW (ref 30.0–100.0)

## 2019-10-15 LAB — T4, FREE: Free T4: 1.55 ng/dL (ref 0.82–1.77)

## 2019-10-17 ENCOUNTER — Encounter (INDEPENDENT_AMBULATORY_CARE_PROVIDER_SITE_OTHER): Payer: Self-pay | Admitting: Bariatrics

## 2019-10-17 DIAGNOSIS — E781 Pure hyperglyceridemia: Secondary | ICD-10-CM | POA: Insufficient documentation

## 2019-10-20 ENCOUNTER — Other Ambulatory Visit: Payer: Self-pay | Admitting: Family Medicine

## 2019-10-24 ENCOUNTER — Other Ambulatory Visit: Payer: Self-pay | Admitting: Family Medicine

## 2019-10-28 ENCOUNTER — Encounter (INDEPENDENT_AMBULATORY_CARE_PROVIDER_SITE_OTHER): Payer: Self-pay | Admitting: Bariatrics

## 2019-10-28 ENCOUNTER — Ambulatory Visit (INDEPENDENT_AMBULATORY_CARE_PROVIDER_SITE_OTHER): Payer: BC Managed Care – PPO | Admitting: Bariatrics

## 2019-10-28 ENCOUNTER — Other Ambulatory Visit: Payer: Self-pay

## 2019-10-28 VITALS — BP 102/66 | HR 60 | Temp 98.4°F | Ht 61.0 in | Wt 240.0 lb

## 2019-10-28 DIAGNOSIS — E119 Type 2 diabetes mellitus without complications: Secondary | ICD-10-CM

## 2019-10-28 DIAGNOSIS — E559 Vitamin D deficiency, unspecified: Secondary | ICD-10-CM | POA: Diagnosis not present

## 2019-10-28 DIAGNOSIS — Z9189 Other specified personal risk factors, not elsewhere classified: Secondary | ICD-10-CM

## 2019-10-28 DIAGNOSIS — Z6841 Body Mass Index (BMI) 40.0 and over, adult: Secondary | ICD-10-CM

## 2019-10-28 DIAGNOSIS — E785 Hyperlipidemia, unspecified: Secondary | ICD-10-CM | POA: Diagnosis not present

## 2019-10-28 MED ORDER — VITAMIN D (ERGOCALCIFEROL) 1.25 MG (50000 UNIT) PO CAPS: 50000.0000 [IU] | ORAL_CAPSULE | ORAL | 0 refills | Status: DC

## 2019-10-28 NOTE — Progress Notes (Signed)
Chief Complaint:   OBESITY Pamela Thomas is here to discuss her progress with her obesity treatment plan along with follow-up of her obesity related diagnoses. Pamela Thomas is on the Category 3 Plan + 100 calories and states she is following her eating plan approximately 85-90% of the time. Pamela Thomas states she is exercising 0 minutes 0 times per week.  Today's visit was #: 2 Starting weight: 246 lbs Starting date: 10/14/2019 Today's weight: 240 lbs Today's date: 10/28/2019 Total lbs lost to date: 6 Total lbs lost since last in-office visit: 6  Interim History: Pamela Thomas is down 6 lbs. She states it is hard to do the plan over the weekend. She struggled with getting in enough protein.   Subjective:   Vitamin D deficiency. Last Vitamin D level 20.3 on 10/14/2019.  Dyslipidemia. Last triglyceride level elevated at 319 on 10/14/2019. Pamela Thomas is taking Vascepa and Lipitor (lipids are controlled except for low HDL of 36).  Type 2 diabetes mellitus without complication, without long-term current use of insulin ([place]). Pamela Thomas is taking metformin. Diabetes is not well controlled.  Lab Results  Component Value Date   HGBA1C 7.2 (H) 10/14/2019   HGBA1C 7.1 (A) 03/20/2019   HGBA1C 7.2 (A) 01/30/2019   Lab Results  Component Value Date   LDLCALC 77 10/14/2019   CREATININE 1.06 (H) 10/14/2019   Lab Results  Component Value Date   INSULIN 8.7 10/14/2019   At risk for osteoporosis. Pamela Thomas is at higher risk of osteopenia and osteoporosis due to Vitamin D deficiency.   Assessment/Plan:   Vitamin D deficiency. Low Vitamin D level contributes to fatigue and are associated with obesity, breast, and colon cancer. She was given a prescription for Vitamin D, Ergocalciferol, (DRISDOL) 1.25 MG (50000 UNIT) CAPS capsule every week #4 with 0 refills and will follow-up for routine testing of Vitamin D, at least 2-3 times per year to avoid over-replacement.       Dyslipidemia. Pamela Thomas will  decrease carbohydrates and continue her medications as prescribed.  Type 2 diabetes mellitus without complication, without long-term current use of insulin ([place]). Good blood sugar control is important to decrease the likelihood of diabetic complications such as nephropathy, neuropathy, limb loss, blindness, coronary artery disease, and death. Intensive lifestyle modification including diet, exercise and weight loss are the first line of treatment for diabetes. Pamela Thomas will continue metformin as prescribed.  At risk for osteoporosis. Pamela Thomas was given approximately 15 minutes of osteoporosis prevention counseling today. Pamela Thomas is at risk for osteopenia and osteoporosis due to her Vitamin D deficiency. She was encouraged to take her Vitamin D and follow her higher calcium diet and increase strengthening exercise to help strengthen her bones and decrease her risk of osteopenia and osteoporosis.  Repetitive spaced learning was employed today to elicit superior memory formation and behavioral change.  Class 3 severe obesity with serious comorbidity and body mass index (BMI) of 45.0 to 49.9 in adult, unspecified obesity type (Pamela Thomas).  Pamela Thomas is currently in the action stage of change. As such, her goal is to continue with weight loss efforts. She has agreed to the Category 3 Plan + 100 calories with lunch options and dinner ideas.   She will work on meal planning and intentional eating.  We independently reviewed lab results from 10/14/2019 with the patient including CMP, Vitamin D, A1c, insulin, and thyroid panel.  Exercise goals: All adults should avoid inactivity. Some physical activity is better than none, and adults who participate in any amount of  physical activity gain some health benefits.  Behavioral modification strategies: increasing lean protein intake, decreasing simple carbohydrates, increasing vegetables, increasing water intake, decreasing eating out, no skipping meals, meal planning and  cooking strategies, keeping healthy foods in the home and planning for success.  Pamela Thomas has agreed to follow-up with our clinic in 2 weeks. She was informed of the importance of frequent follow-up visits to maximize her success with intensive lifestyle modifications for her multiple health conditions.   Objective:   Blood pressure 102/66, pulse 60, temperature 98.4 F (36.9 C), height 5\' 1"  (1.549 m), weight 240 lb (108.9 kg), last menstrual period 10/10/2010, SpO2 96 %. Body mass index is 45.35 kg/m.  General: Cooperative, alert, well developed, in no acute distress. HEENT: Conjunctivae and lids unremarkable. Cardiovascular: Regular rhythm.  Lungs: Normal work of breathing. Neurologic: No focal deficits.   Lab Results  Component Value Date   CREATININE 1.06 (H) 10/14/2019   BUN 27 10/14/2019   NA 141 10/14/2019   K 4.1 10/14/2019   CL 96 10/14/2019   CO2 26 10/14/2019   Lab Results  Component Value Date   ALT 13 10/14/2019   AST 13 10/14/2019   ALKPHOS 80 10/14/2019   BILITOT 0.4 10/14/2019   Lab Results  Component Value Date   HGBA1C 7.2 (H) 10/14/2019   HGBA1C 7.1 (A) 03/20/2019   HGBA1C 7.2 (A) 01/30/2019   HGBA1C 6.2 (H) 08/13/2018   HGBA1C 6.9 01/12/2018   Lab Results  Component Value Date   INSULIN 8.7 10/14/2019   Lab Results  Component Value Date   TSH 1.360 10/14/2019   Lab Results  Component Value Date   CHOL 165 10/14/2019   HDL 36 (L) 10/14/2019   LDLCALC 77 10/14/2019   LDLDIRECT 63 01/14/2019   TRIG 319 (H) 10/14/2019   CHOLHDL 4.6 (H) 06/07/2019   Lab Results  Component Value Date   WBC 8.9 03/20/2019   HGB 12.0 03/20/2019   HCT 36.2 03/20/2019   MCV 79 03/20/2019   PLT 280 03/20/2019   No results found for: IRON, TIBC, FERRITIN  Attestation Statements:   Reviewed by clinician on day of visit: allergies, medications, problem list, medical history, surgical history, family history, social history, and previous encounter  notes.  Migdalia Dk, am acting as Location manager for [employer], DO   I have reviewed the above documentation for accuracy and completeness, and I agree with the above. Jearld Lesch, DO

## 2019-10-29 ENCOUNTER — Encounter (INDEPENDENT_AMBULATORY_CARE_PROVIDER_SITE_OTHER): Payer: Self-pay | Admitting: Bariatrics

## 2019-11-11 ENCOUNTER — Other Ambulatory Visit: Payer: Self-pay

## 2019-11-11 ENCOUNTER — Encounter (INDEPENDENT_AMBULATORY_CARE_PROVIDER_SITE_OTHER): Payer: Self-pay | Admitting: Family Medicine

## 2019-11-11 ENCOUNTER — Ambulatory Visit (INDEPENDENT_AMBULATORY_CARE_PROVIDER_SITE_OTHER): Payer: BC Managed Care – PPO | Admitting: Family Medicine

## 2019-11-11 VITALS — BP 108/72 | HR 63 | Temp 97.7°F | Ht 61.0 in | Wt 236.0 lb

## 2019-11-11 DIAGNOSIS — Z6841 Body Mass Index (BMI) 40.0 and over, adult: Secondary | ICD-10-CM

## 2019-11-11 DIAGNOSIS — E1140 Type 2 diabetes mellitus with diabetic neuropathy, unspecified: Secondary | ICD-10-CM | POA: Diagnosis not present

## 2019-11-11 DIAGNOSIS — R197 Diarrhea, unspecified: Secondary | ICD-10-CM | POA: Diagnosis not present

## 2019-11-11 NOTE — Progress Notes (Signed)
Chief Complaint:   OBESITY Pamela Thomas is here to discuss her progress with her obesity treatment plan along with follow-up of her obesity related diagnoses. Pamela Thomas is on the Category 3 Plan + 100 calories with lunch and dinner options and states she is following her eating plan approximately 70-75% of the time. Pamela Thomas states she is walking for 10 minutes 5 times per week.  Today's visit was #: 3 Starting weight: 246 lbs Starting date: 10/14/2019 Today's weight: 236 lbs Today's date: 11/11/2019 Total lbs lost to date: 10 Total lbs lost since last in-office visit: 4  Interim History: Pamela Thomas reports she is snacking on protein foods. She notes quite a bit of diarrhea and mild nausea. She denies polyphagia. She is struggling to eat all of the protein.  Subjective:   1. Type 2 diabetes mellitus with diabetic neuropathy, without long-term current use of insulin (HCC) Pamela Thomas's diabetes is not well controlled. She is on metformin only. Her fasting BGs range between 145 and 160. She notes they are less recently. Lab Results  Component Value Date   HGBA1C 7.2 (H) 10/14/2019     2. Diarrhea, unspecified type Pamela Thomas reports progressively worsening diarrhea over the past 5 years. She notes mild nausea and denies blood in her stool. She wonders if it is a side effect of her medications.  Assessment/Plan:   1. Type 2 diabetes mellitus with diabetic neuropathy, without long-term current use of insulin (HCC) Good blood sugar control is important to decrease the likelihood of diabetic complications such as nephropathy, neuropathy, limb loss, blindness, coronary artery disease, and death. Intensive lifestyle modification including diet, exercise and weight loss are the first line of treatment for diabetes. Pamela Thomas agreed to continue metformin, and we will continue to monitor.  2. Diarrhea, unspecified type Pamela Thomas will see her primary care physician about her diarrhea.  3. Class 3 severe  obesity with serious comorbidity and body mass index (BMI) of 40.0 to 44.9 in adult, unspecified obesity type (HCC) Pamela Thomas is currently in the action stage of change. As such, her goal is to continue with weight loss efforts. She has agreed to the Category 3 Plan.   We discussed substitutions for 2 oz of meat (protein substitutions).  Exercise goals: Pamela Thomas is to continue her current exercise regimen as is.  Behavioral modification strategies: increasing lean protein intake and planning for success.  Pamela Thomas has agreed to follow-up with our clinic in 2 weeks. She was informed of the importance of frequent follow-up visits to maximize her success with intensive lifestyle modifications for her multiple health conditions.   Objective:   Blood pressure 108/72, pulse 63, temperature 97.7 F (36.5 C), temperature source Oral, height 5\' 1"  (1.549 m), weight 236 lb (107 kg), last menstrual period 10/10/2010, SpO2 94 %. Body mass index is 44.59 kg/m.  General: Cooperative, alert, well developed, in no acute distress. HEENT: Conjunctivae and lids unremarkable. Cardiovascular: Regular rhythm.  Lungs: Normal work of breathing. Neurologic: No focal deficits.   Lab Results  Component Value Date   CREATININE 1.06 (H) 10/14/2019   BUN 27 10/14/2019   NA 141 10/14/2019   K 4.1 10/14/2019   CL 96 10/14/2019   CO2 26 10/14/2019   Lab Results  Component Value Date   ALT 13 10/14/2019   AST 13 10/14/2019   ALKPHOS 80 10/14/2019   BILITOT 0.4 10/14/2019   Lab Results  Component Value Date   HGBA1C 7.2 (H) 10/14/2019   HGBA1C 7.1 (A) 03/20/2019  HGBA1C 7.2 (A) 01/30/2019   HGBA1C 6.2 (H) 08/13/2018   HGBA1C 6.9 01/12/2018   Lab Results  Component Value Date   INSULIN 8.7 10/14/2019   Lab Results  Component Value Date   TSH 1.360 10/14/2019   Lab Results  Component Value Date   CHOL 165 10/14/2019   HDL 36 (L) 10/14/2019   LDLCALC 77 10/14/2019   LDLDIRECT 63 01/14/2019    TRIG 319 (H) 10/14/2019   CHOLHDL 4.6 (H) 06/07/2019   Lab Results  Component Value Date   WBC 8.9 03/20/2019   HGB 12.0 03/20/2019   HCT 36.2 03/20/2019   MCV 79 03/20/2019   PLT 280 03/20/2019   No results found for: IRON, TIBC, FERRITIN  Obesity Behavioral Intervention Documentation for Insurance:   Approximately 15 minutes were spent on the discussion below.  ASK: We discussed the diagnosis of obesity with Pamela Thomas today and Pamela Thomas agreed to give [name] permission to discuss obesity behavioral modification therapy today.  ASSESS: Pamela Thomas has the diagnosis of obesity and her BMI today is 44.61. Pamela Thomas is in the action stage of change.   ADVISE: Pamela Thomas was educated on the multiple health risks of obesity as well as the benefit of weight loss to improve her health. She was advised of the need for long term treatment and the importance of lifestyle modifications to improve her current health and to decrease her risk of future health problems.  AGREE: Multiple dietary modification options and treatment options were discussed and Pamela Thomas agreed to follow the recommendations documented in the above note.  ARRANGE: Pamela Thomas was educated on the importance of frequent visits to treat obesity as outlined per CMS and USPSTF guidelines and agreed to schedule her next follow up appointment today.  Attestation Statements:   Reviewed by clinician on day of visit: allergies, medications, problem list, medical history, surgical history, family history, social history, and previous encounter notes.   Pamela Thomas, am acting as Location manager for [employer], FNP-C.  I have reviewed the above documentation for accuracy and completeness, and I agree with the above. -  Pamela Fick, FNP

## 2019-11-13 ENCOUNTER — Encounter: Payer: Self-pay | Admitting: Family Medicine

## 2019-11-13 ENCOUNTER — Encounter (INDEPENDENT_AMBULATORY_CARE_PROVIDER_SITE_OTHER): Payer: Self-pay | Admitting: Family Medicine

## 2019-11-17 ENCOUNTER — Other Ambulatory Visit (INDEPENDENT_AMBULATORY_CARE_PROVIDER_SITE_OTHER): Payer: Self-pay | Admitting: Bariatrics

## 2019-11-17 DIAGNOSIS — E559 Vitamin D deficiency, unspecified: Secondary | ICD-10-CM

## 2019-11-20 ENCOUNTER — Encounter: Payer: Self-pay | Admitting: *Deleted

## 2019-11-25 ENCOUNTER — Other Ambulatory Visit: Payer: Self-pay

## 2019-11-25 ENCOUNTER — Ambulatory Visit (INDEPENDENT_AMBULATORY_CARE_PROVIDER_SITE_OTHER): Payer: BC Managed Care – PPO | Admitting: Family Medicine

## 2019-11-25 ENCOUNTER — Encounter (INDEPENDENT_AMBULATORY_CARE_PROVIDER_SITE_OTHER): Payer: Self-pay | Admitting: Family Medicine

## 2019-11-25 VITALS — BP 97/61 | HR 70 | Temp 98.1°F | Ht 61.0 in | Wt 234.0 lb

## 2019-11-25 DIAGNOSIS — R198 Other specified symptoms and signs involving the digestive system and abdomen: Secondary | ICD-10-CM

## 2019-11-25 DIAGNOSIS — Z6841 Body Mass Index (BMI) 40.0 and over, adult: Secondary | ICD-10-CM

## 2019-11-25 DIAGNOSIS — E559 Vitamin D deficiency, unspecified: Secondary | ICD-10-CM | POA: Diagnosis not present

## 2019-11-26 ENCOUNTER — Encounter (INDEPENDENT_AMBULATORY_CARE_PROVIDER_SITE_OTHER): Payer: Self-pay | Admitting: Family Medicine

## 2019-11-26 MED ORDER — VITAMIN D (ERGOCALCIFEROL) 1.25 MG (50000 UNIT) PO CAPS: 50000.0000 [IU] | ORAL_CAPSULE | ORAL | 0 refills | Status: DC

## 2019-11-26 NOTE — Progress Notes (Signed)
Chief Complaint:   OBESITY Pamela Thomas is here to discuss her progress with her obesity treatment plan along with follow-up of her obesity related diagnoses. Pamela Thomas is on the Category 3 Plan and states she is following her eating plan approximately 60% of the time. Pamela Thomas states she is walking for 10 minutes 5 times per week.  Today's visit was #: 4 Starting weight: 246 lbs Starting date: 10/14/2019 Today's weight: 234 lbs Today's date: 11/25/2019 Total lbs lost to date: 12 Total lbs lost since last in-office visit: 2  Interim History: Pamela Thomas feels she is not losing weight quickly enough. She reports not eating enough due to constipation and diarrhea.   Subjective:   1. Alternating constipation and diarrhea Pamela Thomas has had several days of constipation which will alternate with diarrhea (up to 10 days). She is drinking a good amount of water (60-80 oz per day). She notes rare nausea. These issues have been going on for >1 year.  2. Vitamin D Deficiency  Pamela Thomas's Vitamin D level was 20.3 on 10/14/19. She is currently taking vit D.   Assessment/Plan:   1. Alternating constipation and diarrhea Pamela Thomas will see her primary care physician this week about this issue. She has an appointment scheduled.   2. Vitamin D Deficiency Low Vitamin D level contributes to fatigue and are associated with obesity, breast, and colon cancer. She agrees to continue to take prescription Vitamin D @50 ,000 IU every week and will follow-up for routine testing of Vitamin D, at least 2-3 times per year to avoid over-replacement.  3. Class 3 severe obesity with serious comorbidity and body mass index (BMI) of 40.0 to 44.9 in adult, unspecified obesity type (HCC) Pamela Thomas is currently in the action stage of change. As such, her goal is to continue with weight loss efforts. She has agreed to the Category 3 Plan.   Handouts given today: Smart fruit, recipes. I encouraged her to eat low glycemic index fruits  mostly. She may have higher glycemic index fruits 2 x per week.  Exercise goals: Pamela Thomas will go back to the gym after she has been fully vaccinated.  Behavioral modification strategies: increasing lean protein intake, decreasing simple carbohydrates and meal planning and cooking strategies.  Pamela Thomas has agreed to follow-up with our clinic in 2 weeks. She was informed of the importance of frequent follow-up visits to maximize her success with intensive lifestyle modifications for her multiple health conditions.   Objective:   Blood pressure 97/61, pulse 70, temperature 98.1 F (36.7 C), temperature source Oral, height 5\' 1"  (1.549 m), weight 234 lb (106.1 kg), last menstrual period 10/10/2010, SpO2 96 %. Body mass index is 44.21 kg/m.  General: Cooperative, alert, well developed, in no acute distress. HEENT: Conjunctivae and lids unremarkable. Cardiovascular: Regular rhythm.  Lungs: Normal work of breathing. Neurologic: No focal deficits.   Lab Results  Component Value Date   CREATININE 1.06 (H) 10/14/2019   BUN 27 10/14/2019   NA 141 10/14/2019   K 4.1 10/14/2019   CL 96 10/14/2019   CO2 26 10/14/2019   Lab Results  Component Value Date   ALT 13 10/14/2019   AST 13 10/14/2019   ALKPHOS 80 10/14/2019   BILITOT 0.4 10/14/2019   Lab Results  Component Value Date   HGBA1C 7.2 (H) 10/14/2019   HGBA1C 7.1 (A) 03/20/2019   HGBA1C 7.2 (A) 01/30/2019   HGBA1C 6.2 (H) 08/13/2018   HGBA1C 6.9 01/12/2018   Lab Results  Component Value Date  INSULIN 8.7 10/14/2019   Lab Results  Component Value Date   TSH 1.360 10/14/2019   Lab Results  Component Value Date   CHOL 165 10/14/2019   HDL 36 (L) 10/14/2019   LDLCALC 77 10/14/2019   LDLDIRECT 63 01/14/2019   TRIG 319 (H) 10/14/2019   CHOLHDL 4.6 (H) 06/07/2019   Lab Results  Component Value Date   WBC 8.9 03/20/2019   HGB 12.0 03/20/2019   HCT 36.2 03/20/2019   MCV 79 03/20/2019   PLT 280 03/20/2019   No results  found for: IRON, TIBC, FERRITIN  Obesity Behavioral Intervention Documentation for Insurance:   Approximately 15 minutes were spent on the discussion below.  ASK: We discussed the diagnosis of obesity with Pamela Thomas today and Pamela Thomas agreed to give [name] permission to discuss obesity behavioral modification therapy today.  ASSESS: Pamela Thomas has the diagnosis of obesity and her BMI today is 44.24. Pamela Thomas is in the action stage of change.   ADVISE: Pamela Thomas was educated on the multiple health risks of obesity as well as the benefit of weight loss to improve her health. She was advised of the need for long term treatment and the importance of lifestyle modifications to improve her current health and to decrease her risk of future health problems.  AGREE: Multiple dietary modification options and treatment options were discussed and Pamela Thomas agreed to follow the recommendations documented in the above note.  ARRANGE: Pamela Thomas was educated on the importance of frequent visits to treat obesity as outlined per CMS and USPSTF guidelines and agreed to schedule her next follow up appointment today.  Attestation Statements:   Reviewed by clinician on day of visit: allergies, medications, problem list, medical history, surgical history, family history, social history, and previous encounter notes.   Wilhemena Durie, am acting as Location manager for [employer], FNP-C.  I have reviewed the above documentation for accuracy and completeness, and I agree with the above. -  Georgianne Fick, FNP

## 2019-11-26 NOTE — Addendum Note (Signed)
Addended by: Georgianne Fick on: 11/26/2019 01:26 PM   Modules accepted: Level of Service

## 2019-11-27 ENCOUNTER — Ambulatory Visit (INDEPENDENT_AMBULATORY_CARE_PROVIDER_SITE_OTHER): Payer: BC Managed Care – PPO | Admitting: Family Medicine

## 2019-11-27 ENCOUNTER — Encounter: Payer: Self-pay | Admitting: Family Medicine

## 2019-11-27 ENCOUNTER — Other Ambulatory Visit: Payer: Self-pay

## 2019-11-27 DIAGNOSIS — E785 Hyperlipidemia, unspecified: Secondary | ICD-10-CM

## 2019-11-27 DIAGNOSIS — E1169 Type 2 diabetes mellitus with other specified complication: Secondary | ICD-10-CM

## 2019-11-27 MED ORDER — FLUOXETINE HCL 40 MG PO CAPS: 40.0000 mg | ORAL_CAPSULE | Freq: Every day | ORAL | 3 refills | Status: DC

## 2019-11-27 NOTE — Patient Instructions (Addendum)
Stop the vacepa fpr 4 days. Then restart it st half dose for 4 days. MYCHART me the results re diarrhea. CALL ME if anything gets worse.  Add miralax at 1/4 scoop a day increasing each week until at 4 weeks you are at a whole scoop EVERY DAY.

## 2019-11-29 NOTE — Progress Notes (Signed)
    CHIEF COMPLAINT / HPI:  1. Diarrhea that is episodic, can come on without warning. Occurring last 2-3 weeks. Has had a couple of times when she was unable to get to restroom on time and is frustrated by this.  2. Obesity: Has been following the weigt management offered program. Altering her diet some and making better choices. She has been doing this for several months and has lost 14 pounds; does not think this is related to diarrhea. 3. DM: taking her meds. Wonders if her metformin is causing diarrhea altho no changes in it. Had cologuard but has not had a colonoscopy   PERTINENT  PMH / PSH: I have reviewed the patient's medications, allergies, past medical and surgical history, smoking status and updated in the EMR as appropriate.   OBJECTIVE:  BP 102/64   Pulse 68   Wt 236 lb (107 kg)   LMP 10/10/2010   SpO2 96%   BMI 44.59 kg/m  Vital signs reviewed. GENERAL: Well-developed, well-nourished, no acute distress. CARDIOVASCULAR: Regular rate and rhythm ABDOMEN: Soft positive bowel sounds NEURO: No gross focal neurological deficits. MSK: Movement of extremity x 4.    ASSESSMENT / PLAN: 1. Dairrhea: may be realted to meds (see info under prob,em of hyperlipidemia). Would also start very gradual daily miralax. F/u 1 m.  Hyperlipidemia associated with type 2 diabetes mellitus ([place]) Her cardiologsit had started her on Vacepa. That may be part of the issue. Will do a short trial off that.She will contact me with results in 1 week  Obesity Congratulated her on success with weight management.   [name] MD

## 2019-11-29 NOTE — Assessment & Plan Note (Signed)
Congratulated her on success with weight management.

## 2019-11-29 NOTE — Assessment & Plan Note (Signed)
Her cardiologsit had started her on Vacepa. That may be part of the issue. Will do a short trial off that.She will contact me with results in 1 week

## 2019-12-05 ENCOUNTER — Encounter: Payer: Self-pay | Admitting: Family Medicine

## 2019-12-06 ENCOUNTER — Ambulatory Visit: Payer: BC Managed Care – PPO | Admitting: Physician Assistant

## 2019-12-06 ENCOUNTER — Other Ambulatory Visit: Payer: Self-pay | Admitting: Family Medicine

## 2019-12-06 ENCOUNTER — Encounter: Payer: Self-pay | Admitting: Physician Assistant

## 2019-12-06 ENCOUNTER — Other Ambulatory Visit: Payer: Self-pay

## 2019-12-06 DIAGNOSIS — C44219 Basal cell carcinoma of skin of left ear and external auricular canal: Secondary | ICD-10-CM

## 2019-12-06 DIAGNOSIS — D0439 Carcinoma in situ of skin of other parts of face: Secondary | ICD-10-CM

## 2019-12-06 DIAGNOSIS — D492 Neoplasm of unspecified behavior of bone, soft tissue, and skin: Secondary | ICD-10-CM

## 2019-12-06 DIAGNOSIS — L570 Actinic keratosis: Secondary | ICD-10-CM | POA: Diagnosis not present

## 2019-12-06 MED ORDER — FLUOXETINE HCL 20 MG PO TABS: 20.0000 mg | ORAL_TABLET | Freq: Every day | ORAL | 3 refills | Status: DC

## 2019-12-06 MED ORDER — FLUOXETINE HCL 10 MG PO CAPS: ORAL_CAPSULE | ORAL | 3 refills | Status: DC

## 2019-12-06 NOTE — Progress Notes (Addendum)
Follow-Up Visit   Subjective  Pamela Thomas is a 64 y.o. female who presents for the following: Skin Problem (check spot on right and left ears. Crusty like spots. Check patients face all over. ).   Location: left ear- Duration: 6 months Quality: pearly knot Associated Signs/Symptoms: Modifying Factors: persistent    The following portions of the chart were reviewed this encounter and updated as appropriate: Tobacco  Allergies  Meds  Problems  Med Hx  Surg Hx  Fam Hx      Objective  Well appearing patient in no apparent distress; mood and affect are within normal limits.  All skin waist up examined. No atypical nevi noted at the time of the visit.  Objective  Right Malar Cheek, Right Superior Helix: Erythematous patches with gritty scale.  Objective  Left Ear: Pearly papule with telangectasia.      Left Buccal Cheek   Objective  Left Buccal Cheek : Hyperkeratotic scale with pink base   Images    Assessment & Plan  AK (actinic keratosis) (2) Right Superior Helix; Right Malar Cheek  Destruction of lesion - Right Malar Cheek, Right Superior Helix Complexity: simple   Destruction method: cryotherapy   Informed consent: discussed and consent obtained   Timeout:  patient name, date of birth, surgical site, and procedure verified Lesion destroyed using liquid nitrogen: Yes   Cryotherapy cycles:  1 Outcome: patient tolerated procedure well with no complications   Post-procedure details: wound care instructions given    Basal cell carcinoma (BCC) of skin of left ear Left Ear  Epidermal / dermal shaving  Lesion length (cm):  1.3 Lesion width (cm):  1.3 Margin per side (cm):  0.1 Total excision diameter (cm):  1.5 Informed consent: discussed and consent obtained   Timeout: patient name, date of birth, surgical site, and procedure verified   Procedure prep:  Patient was prepped and draped in usual sterile fashion Prep type:   Chlorhexidine Anesthesia: the lesion was anesthetized in a standard fashion   Anesthetic:  1% lidocaine w/ epinephrine 1-100,000 local infiltration Instrument used: flexible razor blade   Hemostasis achieved with: aluminum chloride   Outcome: patient tolerated procedure well   Post-procedure details: sterile dressing applied and wound care instructions given   Dressing type: petrolatum gauze, petrolatum and bandage    Destruction of lesion Complexity: simple   Destruction method: electrodesiccation and curettage   Informed consent: discussed and consent obtained   Timeout:  patient name, date of birth, surgical site, and procedure verified Anesthesia: the lesion was anesthetized in a standard fashion   Anesthetic:  1% lidocaine w/ epinephrine 1-100,000 local infiltration Curettage performed in three different directions: Yes   Electrodesiccation performed over the curetted area: Yes   Curettage cycles:  3 Margin per side (cm):  0.1 Hemostasis achieved with:  aluminum chloride Outcome: patient tolerated procedure well with no complications   Post-procedure details: wound care instructions given    Specimen 2 - Surgical pathology Differential Diagnosis: bcc Check Margins: No  TX p BX - curet + cautery   Other Related Procedures Epidermal / dermal shaving  Lesion length (cm):  1.2 Lesion width (cm):  1.2 Margin per side (cm):  0.1 Total excision diameter (cm):  1.4 Informed consent: discussed and consent obtained   Timeout: patient name, date of birth, surgical site, and procedure verified   Procedure prep:  Patient was prepped and draped in usual sterile fashion Prep type:  Chlorhexidine Anesthesia: the lesion was anesthetized in  a standard fashion   Anesthetic:  1% lidocaine w/ epinephrine 1-100,000 local infiltration Instrument used: DermaBlade   Hemostasis achieved with: aluminum chloride   Outcome: patient tolerated procedure well   Post-procedure details: sterile  dressing applied and wound care instructions given   Dressing type: petrolatum gauze, petrolatum and bandage   Destruction of lesion Complexity: simple   Destruction method: electrodesiccation and curettage   Informed consent: discussed and consent obtained   Timeout:  patient name, date of birth, surgical site, and procedure verified Anesthesia: the lesion was anesthetized in a standard fashion   Anesthetic:  1% lidocaine w/ epinephrine 1-100,000 local infiltration Curettage performed in three different directions: Yes   Electrodesiccation performed over the curetted area: Yes   Curettage cycles:  3 Lesion length (cm):  1.2 Lesion width (cm):  1.2 Margin per side (cm):  0.1 Final wound size (cm):  1.4 Hemostasis achieved with:  aluminum chloride Outcome: patient tolerated procedure well with no complications   Post-procedure details: wound care instructions given    Carcinoma in situ of skin of other part of face Left Buccal Cheek   Epidermal / dermal shaving - Left Buccal Cheek   Lesion length (cm):  1.2 Lesion width (cm):  1.2 Margin per side (cm):  0.1 Total excision diameter (cm):  1.4 Informed consent: discussed and consent obtained   Timeout: patient name, date of birth, surgical site, and procedure verified   Procedure prep:  Patient was prepped and draped in usual sterile fashion Prep type:  Chlorhexidine Anesthesia: the lesion was anesthetized in a standard fashion   Anesthetic:  1% lidocaine w/ epinephrine 1-100,000 local infiltration Instrument used: DermaBlade   Hemostasis achieved with: aluminum chloride   Outcome: patient tolerated procedure well   Post-procedure details: sterile dressing applied and wound care instructions given   Dressing type: petrolatum gauze, petrolatum and bandage    Destruction of lesion - Left Buccal Cheek  Complexity: simple   Destruction method: electrodesiccation and curettage   Informed consent: discussed and consent obtained    Timeout:  patient name, date of birth, surgical site, and procedure verified Anesthesia: the lesion was anesthetized in a standard fashion   Anesthetic:  1% lidocaine w/ epinephrine 1-100,000 local infiltration Curettage performed in three different directions: Yes   Electrodesiccation performed over the curetted area: Yes   Curettage cycles:  3 Lesion length (cm):  1.2 Lesion width (cm):  1.2 Margin per side (cm):  0.1 Final wound size (cm):  1.4 Hemostasis achieved with:  aluminum chloride Outcome: patient tolerated procedure well with no complications   Post-procedure details: wound care instructions given    Specimen 1 - Surgical pathology Differential Diagnosis:scc Check Margins: No  TX p BX - curet + cautery

## 2019-12-06 NOTE — Patient Instructions (Signed)

## 2019-12-10 ENCOUNTER — Other Ambulatory Visit: Payer: Self-pay

## 2019-12-10 ENCOUNTER — Encounter (INDEPENDENT_AMBULATORY_CARE_PROVIDER_SITE_OTHER): Payer: Self-pay | Admitting: Family Medicine

## 2019-12-10 ENCOUNTER — Ambulatory Visit (INDEPENDENT_AMBULATORY_CARE_PROVIDER_SITE_OTHER): Payer: BC Managed Care – PPO | Admitting: Family Medicine

## 2019-12-10 VITALS — BP 91/56 | HR 63 | Temp 97.6°F | Ht 61.0 in | Wt 232.0 lb

## 2019-12-10 DIAGNOSIS — E1165 Type 2 diabetes mellitus with hyperglycemia: Secondary | ICD-10-CM | POA: Diagnosis not present

## 2019-12-10 DIAGNOSIS — Z6841 Body Mass Index (BMI) 40.0 and over, adult: Secondary | ICD-10-CM

## 2019-12-10 DIAGNOSIS — I959 Hypotension, unspecified: Secondary | ICD-10-CM | POA: Diagnosis not present

## 2019-12-10 DIAGNOSIS — R197 Diarrhea, unspecified: Secondary | ICD-10-CM | POA: Diagnosis not present

## 2019-12-10 NOTE — Progress Notes (Signed)
Chief Complaint:   OBESITY Pamela Thomas is here to discuss her progress with her obesity treatment plan along with follow-up of her obesity related diagnoses. Pamela Thomas is on the Category 3 Plan and states she is following her eating plan approximately 60% of the time. Pamela Thomas states she is doing 0 minutes 0 times per week.  Today's visit was #: 5 Starting weight: 246 lbs Starting date: 10/14/2019 Today's weight: 232 lbs Today's date: 12/10/2019 Total lbs lost to date: 14 Total lbs lost since last in-office visit: 2  Interim History: Pamela Thomas is surprised that she lost weight because she has been off the plan. She reports no polyphagia. She is still skipping some meals, but this is improving. She is most likely to skip lunch.  Subjective:   1. Diarrhea, unspecified type Pamela Thomas added fiber at the advice of her primary care physician, and her diarrhea has improved. The trial off of Vascepa is also helping.  2. Hypotension, unspecified hypotension type Pamela Thomas reports positional hypotension over the last 6 months with added medications after her MI. BP Readings from Last 3 Encounters:  12/10/19 (!) 91/56  11/27/19 102/64  11/25/19 97/61    3. Type 2 diabetes mellitus with hyperglycemia, without long-term current use of insulin (HCC) Pamela Thomas's diabetes mellitus is not well controlled. She is on metformin. Lab Results  Component Value Date   HGBA1C 7.2 (H) 10/14/2019    Assessment/Plan:   1. Diarrhea, unspecified type Pamela Thomas is to follow up with Cardiology on 12/12/2019 to discuss Vascepa options. She will continue Benefiber.  2. Hypotension, unspecified hypotension type Pamela Thomas is to discuss possible med adjustment with Cardiology on 12/12/2019.  3. Type 2 diabetes mellitus with hyperglycemia, without long-term current use of insulin (HCC) Pamela Thomas will continue all of her medications and diet.  4. Class 3 severe obesity with serious comorbidity and body mass index (BMI) of 40.0  to 44.9 in adult, unspecified obesity type (HCC) Pamela Thomas is currently in the action stage of change. As such, her goal is to continue with weight loss efforts. She has agreed to the Category 3 Plan.   Exercise goals: She will consider working exercise into her day.  Behavioral modification strategies: increasing lean protein intake and no skipping meals.  Pamela Thomas has agreed to follow-up with our clinic in 2 weeks. She was informed of the importance of frequent follow-up visits to maximize her success with intensive lifestyle modifications for her multiple health conditions.   Objective:   Blood pressure (!) 91/56, pulse 63, temperature 97.6 F (36.4 C), temperature source Oral, height 5\' 1"  (1.549 m), weight 232 lb (105.2 kg), last menstrual period 10/10/2010, SpO2 98 %. Body mass index is 43.84 kg/m.  General: Cooperative, alert, well developed, in no acute distress. HEENT: Conjunctivae and lids unremarkable. Cardiovascular: Regular rhythm.  Lungs: Normal work of breathing. Neurologic: No focal deficits.   Lab Results  Component Value Date   CREATININE 1.06 (H) 10/14/2019   BUN 27 10/14/2019   NA 141 10/14/2019   K 4.1 10/14/2019   CL 96 10/14/2019   CO2 26 10/14/2019   Lab Results  Component Value Date   ALT 13 10/14/2019   AST 13 10/14/2019   ALKPHOS 80 10/14/2019   BILITOT 0.4 10/14/2019   Lab Results  Component Value Date   HGBA1C 7.2 (H) 10/14/2019   HGBA1C 7.1 (A) 03/20/2019   HGBA1C 7.2 (A) 01/30/2019   HGBA1C 6.2 (H) 08/13/2018   HGBA1C 6.9 01/12/2018   Lab Results  Component  Value Date   INSULIN 8.7 10/14/2019   Lab Results  Component Value Date   TSH 1.360 10/14/2019   Lab Results  Component Value Date   CHOL 165 10/14/2019   HDL 36 (L) 10/14/2019   LDLCALC 77 10/14/2019   LDLDIRECT 63 01/14/2019   TRIG 319 (H) 10/14/2019   CHOLHDL 4.6 (H) 06/07/2019   Lab Results  Component Value Date   WBC 8.9 03/20/2019   HGB 12.0 03/20/2019   HCT 36.2  03/20/2019   MCV 79 03/20/2019   PLT 280 03/20/2019   No results found for: IRON, TIBC, FERRITIN  Attestation Statements:   Reviewed by clinician on day of visit: allergies, medications, problem list, medical history, surgical history, family history, social history, and previous encounter notes.   Wilhemena Durie, am acting as Location manager for [employer], FNP-C.  I have reviewed the above documentation for accuracy and completeness, and I agree with the above. - Georgianne Fick, FNP

## 2019-12-12 ENCOUNTER — Telehealth: Payer: Self-pay | Admitting: Internal Medicine

## 2019-12-12 ENCOUNTER — Telehealth (INDEPENDENT_AMBULATORY_CARE_PROVIDER_SITE_OTHER): Payer: BC Managed Care – PPO | Admitting: Internal Medicine

## 2019-12-12 DIAGNOSIS — E7800 Pure hypercholesterolemia, unspecified: Secondary | ICD-10-CM

## 2019-12-12 DIAGNOSIS — Z951 Presence of aortocoronary bypass graft: Secondary | ICD-10-CM

## 2019-12-12 DIAGNOSIS — E1169 Type 2 diabetes mellitus with other specified complication: Secondary | ICD-10-CM

## 2019-12-12 DIAGNOSIS — E785 Hyperlipidemia, unspecified: Secondary | ICD-10-CM

## 2019-12-12 DIAGNOSIS — E781 Pure hyperglyceridemia: Secondary | ICD-10-CM

## 2019-12-12 DIAGNOSIS — I6523 Occlusion and stenosis of bilateral carotid arteries: Secondary | ICD-10-CM

## 2019-12-12 DIAGNOSIS — I2510 Atherosclerotic heart disease of native coronary artery without angina pectoris: Secondary | ICD-10-CM

## 2019-12-12 MED ORDER — EZETIMIBE 10 MG PO TABS: 10.0000 mg | ORAL_TABLET | Freq: Every day | ORAL | 3 refills | Status: DC

## 2019-12-12 NOTE — Patient Instructions (Signed)
Medication Instructions:  STOP amlodipine HOLD vascepa START zetia 10mg  daily Continue other current medications   *If you need a refill on your cardiac medications before your next appointment, please call your pharmacy*   Lab Work: FASTING lab work in 3 months to check cholesterol   If you have labs (blood work) drawn today and your tests are completely normal, you will receive your results only by: Marland Kitchen MyChart Message (if you have MyChart) OR . A paper copy in the mail If you have any lab test that is abnormal or we need to change your treatment, we will call you to review the results.   Testing/Procedures: NONE   Follow-Up: At [facility], you and your health needs are our priority.  As part of our continuing mission to provide you with exceptional heart care, we have created designated Provider Care Teams.  These Care Teams include your primary Cardiologist (physician) and Advanced Practice Providers (APPs -  Physician Assistants and Nurse Practitioners) who all work together to provide you with the care you need, when you need it.  We recommend signing up for the patient portal called "MyChart".  Sign up information is provided on this After Visit Summary.  MyChart is used to connect with patients for Virtual Visits (Telemedicine).  Patients are able to view lab/test results, encounter notes, upcoming appointments, etc.  Non-urgent messages can be sent to your provider as well.   To learn more about what you can do with MyChart, go to [email or web address].    Your next appointment:   6 month(s) - lipid clinic  The format for your next appointment:   Either In Person or Virtual  Provider:   K. Mali Hilty, MD   Other Instructions

## 2019-12-12 NOTE — Telephone Encounter (Signed)
PA for zetia 10mg  submitted via CMM and has been approved until 12/11/2020

## 2019-12-12 NOTE — Progress Notes (Signed)
Virtual Visit via Video Note   This visit type was conducted due to national recommendations for restrictions regarding the COVID-19 Pandemic (e.g. social distancing) in an effort to limit this patient's exposure and mitigate transmission in our community.  Due to her co-morbid illnesses, this patient is at least at moderate risk for complications without adequate follow up.  This format is felt to be most appropriate for this patient at this time.  All issues noted in this document were discussed and addressed.  A limited physical exam was performed with this format.  Please refer to the patient's chart for her consent to telehealth for [facility].   Evaluation Performed:  Telephone visit  Date:  12/12/2019   ID:  Pamela Thomas, DOB 1956-05-11, MRN [id number]  Patient Location:  [address] Pamela Thomas [address]  Provider location:   [address] [place], [place] [address]  PCP:  Dickie La, MD  Cardiologist:  Minus Breeding, MD Electrophysiologist:  None   Chief Complaint:  Follow-up dyslipidemia  History of Present Illness:    Pamela Thomas is a 64 y.o. female who presents via audio/video conferencing for a telehealth visit today.  [name] is seen today for video follow-up.  She has a history of mixed dyslipidemia with elevated LDL and high triglycerides and low HDL.  She also has morbid obesity, type 2 diabetes and had a recent acute inferior MI.  She is status post CABG and PCI.  I saw her in the lipid clinic in November and we added Vascepa 2 g twice daily to her current regimen.  She was on atorvastatin 80 mg.  She is also made some dietary changes.  She had a repeat lipid profile yesterday, menstruating a reduction in total cholesterol to 125, triglycerides 212 (down from 576), HDL 34 and LDL of 49.  She does report that despite the improvement in her numbers, she has been having some side effects from the atorvastatin.  She says  every day she reports achiness in her joints and bones after taking her statin medication the generally wears off after several hours and then comes back again.  This may likely be a side effect of the statin, particularly the high potency statin.  As she has well exceeded her target LDL less than 70, we may have room to decrease the dose somewhat.  12/12/2019  Ms. [name] returns today for follow-up.  Recently she has been having some significant diarrhea.  She had been on Vascepa with marked improvement in her numbers however says that she had been tolerating the medication.  Apparently this got worse to the point where she could not tolerate it.  She then stopped the Vascepa for several days at the direction of her primary care provider and symptoms improved somewhat.  She also started on stool binding agent.  More recent labs show an increase in her triglycerides from 281-319 and LDL remains still above target LDL of less than 70 at 77.  She has been working with the weight loss clinic I am pleased to see that she has lost now between 10 to 15 pounds which likely has improved her numbers.  In addition she has been recently hypotensive with blood pressures in the 90s and low 100s.  The patient does not have symptoms concerning for COVID-19 infection (fever, chills, cough, or new SHORTNESS OF BREATH).    Prior CV studies:   The following studies were reviewed today:  Lab  work  PMHx:  Past Medical History:  Diagnosis Date  . ALLERGIC RHINITIS, SEASONAL   . Anxiety   . Back pain   . BCC (basal cell carcinoma of skin) 2005   [facility])  . Constipation   . Coronary artery disease   . Depression   . Diabetes mellitus without complication ([place])   . DYSLIPIDEMIA   . Dysrhythmia    palpitations-evaluated by Dr Johnsie Cancel 6/12/eccho 6/13  . Edema of both lower legs   . Glucose intolerance (impaired glucose tolerance)   . History of blood transfusion   . Hypertension   . Joint pain    . OBESITY, NOS   . Osteoarthritis    rt knee  . PLANTAR FASCIITIS, BILATERAL   . SOB (shortness of breath)   . Vertigo   . Vitamin D deficiency     Past Surgical History:  Procedure Laterality Date  . BREAST BIOPSY  05/27/03   left  . CARDIAC CATHETERIZATION    . CARPAL TUNNEL RELEASE  05/27/03   Right  . CESAREAN SECTION  05/27/03  . CORONARY ARTERY BYPASS GRAFT N/A 08/17/2018   Procedure: CORONARY ARTERY BYPASS GRAFTING (CABG) TIMES TWO: LIMA to LAD, SVG to RAMUS INTERMEDIATE)  USING LEFT INTERNAL MAMMARY ARTERY AND RIGHT GREATER SAPHENOUS VEIN HARVESTED ENDOSCOPICALLY.;  Surgeon: [name], MD;  Location: [facility];  Service: Open Heart Surgery;  Laterality: N/A;  . KNEE SURGERY     left- removal fatty tumor  . ROTATOR CUFF REPAIR     Bilateral  . TEE WITHOUT CARDIOVERSION N/A 08/17/2018   Procedure: TRANSESOPHAGEAL ECHOCARDIOGRAM (TEE);  Surgeon: [name], MD;  Location: [facility];  Service: Open Heart Surgery;  Laterality: N/A;  . TOTAL KNEE ARTHROPLASTY  10/10/2012   Procedure: TOTAL KNEE ARTHROPLASTY;  Surgeon: Gearlean Alf, MD;  Location: [facility];  Service: Orthopedics;  Laterality: Right;    FAMHx:  Family History  Problem Relation Age of Onset  . COPD Mother        Deceased  . High blood pressure Mother   . High Cholesterol Mother   . Depression Mother   . Lung cancer Father        Deceaesd  . High Cholesterol Father   . Diabetes Other   . Melanoma Brother        Deceased  . COPD Brother   . Healthy Daughter     SOCHx:   reports that she quit smoking about 29 years ago. She has never used smokeless tobacco. She reports current alcohol use. She reports that she does not use drugs.  ALLERGIES:  Allergies  Allergen Reactions  . Morphine And Related     Severe hallucinations, difficult arousal.   . Statins     Myalgia pain moderate, flu like symptoms.   . Oxycodone Nausea Only    Has to take nausea medications if taken. Prefer not to take     MEDS:  Current Meds  Medication Sig  . amLODipine (NORVASC) 5 MG tablet Take 1 tablet (5 mg total) by mouth daily.  Marland Kitchen aspirin EC 81 MG tablet Take 1 tablet (81 mg total) by mouth daily.  . candesartan (ATACAND) 32 MG tablet TAKE 1 TABLET BY MOUTH EVERY DAY  . chlorthalidone (HYGROTON) 25 MG tablet TAKE 1 TABLET BY MOUTH EVERY DAY  . FLUoxetine (PROZAC) 10 MG capsule Take one by mouth daily in addition to the one tab of 20 mg fluoxetine for total 30 mg a  day  . FLUoxetine (PROZAC) 20 MG tablet Take 1 tablet (20 mg total) by mouth daily.  . furosemide (LASIX) 20 MG tablet TAKE 1 TABLET BY MOUTH TWICE A DAY  . KLOR-CON M20 20 MEQ tablet TAKE 1 TABLET BY MOUTH EVERY DAY  . metFORMIN (GLUCOPHAGE) 1000 MG tablet Take 1 tablet (1,000 mg total) by mouth 2 (two) times daily with a meal.  . metoprolol succinate (TOPROL-XL) 50 MG 24 hr tablet Take 1 tablet (50 mg total) by mouth 2 (two) times daily. Take with or immediately following a meal.  . VASCEPA 1 g capsule TAKE 2 CAPSULES (2 G TOTAL) BY MOUTH 2 (TWO) TIMES DAILY.  Marland Kitchen Vitamin D, Ergocalciferol, (DRISDOL) 1.25 MG (50000 UNIT) CAPS capsule Take 1 capsule (50,000 Units total) by mouth every 7 (seven) days.     ROS: Pertinent items noted in HPI and remainder of comprehensive ROS otherwise negative.  Labs/Other Tests and Data Reviewed:    Recent Labs: 03/20/2019: BNP 208.4; Hemoglobin 12.0; Platelets 280 10/14/2019: ALT 13; BUN 27; Creatinine, Ser 1.06; Potassium 4.1; Sodium 141; TSH 1.360   Recent Lipid Panel Lab Results  Component Value Date/Time   CHOL 165 10/14/2019 12:55 PM   TRIG 319 (H) 10/14/2019 12:55 PM   HDL 36 (L) 10/14/2019 12:55 PM   CHOLHDL 4.6 (H) 06/07/2019 09:47 AM   CHOLHDL 5.5 05/11/2011 09:32 AM   LDLCALC 77 10/14/2019 12:55 PM   LDLDIRECT 63 01/14/2019 10:05 AM   LDLDIRECT 76 07/27/2016 10:48 AM    Wt Readings from Last 3 Encounters:  12/10/19 232 lb (105.2 kg)  11/27/19 236 lb (107 kg)  11/25/19 234 lb (106.1  kg)     Exam:    Vital Signs:  LMP 10/10/2010    Deferred  ASSESSMENT & PLAN:    1. Mixed dyslipidemia (elevated LDL and high triglycerides, low HDL) 2. Morbid obesity-recent weight loss, under the direction of weight management 3. Type 2 diabetes -controlled  4. Recent acute inferior MI 5. Status post CABG x1  Ms. Choi has had recent weight loss and unfortunately suffering from recurrent diarrhea.  Although she initially said she was tolerating the Vascepa with improvement in her triglycerides, she felt this now might be the cause of her symptoms.  After stopping the Vascepa, her diarrhea has improved.  Her LDL remains above 70.  I would advise that we consider trying ezetimibe 10 mg daily in addition to her statin.  This could help with her triglycerides and get her LDL to target.  Side effects could be GI related, so she has to monitor for that as well.  For now she should hold off on the Vascepa.  Finally, blood pressure has been running low in the 90s and low 100s.  I advised her to stop her amlodipine but continue other medications.  She should monitor blood pressure closely.  COVID-19 Education: The signs and symptoms of COVID-19 were discussed with the patient and how to seek care for testing (follow up with PCP or arrange E-visit).  The importance of social distancing was discussed today.  Patient Risk:   After full review of this patients clinical status, I feel that they are at least moderate risk at this time.  Time:   Today, I have spent 25 minutes with the patient with telehealth technology discussing dyslipidemia, coronary disease, diabetes, weight loss and dietary/lifestyle modifications.     Medication Adjustments/Labs and Tests Ordered: Current medicines are reviewed at length with the patient today.  Concerns  regarding medicines are outlined above.   Tests Ordered: No orders of the defined types were placed in this encounter.   Medication Changes: No orders  of the defined types were placed in this encounter.   Disposition:  in 6 month(s)  Pixie Casino, MD, [facility], [place] Director of the Advanced Lipid Disorders &  Cardiovascular Risk Reduction Clinic Diplomate of the American Board of Clinical Lipidology Attending Cardiologist  Direct Dial: [phone]  Fax: [phone]  Website:  [email or web address]  Pixie Casino, MD  12/12/2019 9:08 AM

## 2019-12-21 ENCOUNTER — Other Ambulatory Visit: Payer: Self-pay | Admitting: Family Medicine

## 2019-12-21 ENCOUNTER — Other Ambulatory Visit: Payer: Self-pay | Admitting: Internal Medicine

## 2019-12-21 DIAGNOSIS — E782 Mixed hyperlipidemia: Secondary | ICD-10-CM

## 2019-12-24 ENCOUNTER — Ambulatory Visit (INDEPENDENT_AMBULATORY_CARE_PROVIDER_SITE_OTHER): Payer: BC Managed Care – PPO | Admitting: Family Medicine

## 2019-12-24 ENCOUNTER — Encounter (INDEPENDENT_AMBULATORY_CARE_PROVIDER_SITE_OTHER): Payer: Self-pay | Admitting: Family Medicine

## 2019-12-24 ENCOUNTER — Other Ambulatory Visit: Payer: Self-pay

## 2019-12-24 VITALS — BP 84/51 | HR 66 | Temp 98.0°F | Ht 61.0 in | Wt 231.0 lb

## 2019-12-24 DIAGNOSIS — E559 Vitamin D deficiency, unspecified: Secondary | ICD-10-CM | POA: Diagnosis not present

## 2019-12-24 DIAGNOSIS — Z6841 Body Mass Index (BMI) 40.0 and over, adult: Secondary | ICD-10-CM | POA: Diagnosis not present

## 2019-12-24 DIAGNOSIS — I952 Hypotension due to drugs: Secondary | ICD-10-CM

## 2019-12-24 MED ORDER — VITAMIN D (ERGOCALCIFEROL) 1.25 MG (50000 UNIT) PO CAPS: 50000.0000 [IU] | ORAL_CAPSULE | ORAL | 0 refills | Status: DC

## 2019-12-24 NOTE — Progress Notes (Signed)
Chief Complaint:   OBESITY Pamela Thomas is here to discuss her progress with her obesity treatment plan along with follow-up of her obesity related diagnoses. Pamela Thomas is on the Category 3 Plan and states she is following her eating plan approximately 60% of the time. Pamela Thomas states she is doing 0 minutes 0 times per week.  Today's visit was #: 4 Starting weight: 246 lbs Starting date: 10/14/2019 Today's weight: 231 lbs Today's date: 12/24/2019 Total lbs lost to date: 15 Total lbs lost since last in-office visit: 1  Interim History: Brettany's diarrhea has been much better without Vascepa. She has lost weight even though she has been traveling the past few weeks. She sold her house and has bought a new one. She is now packing to move.  Subjective:   1. Hypotension, unspecified hypotension type Pamela Thomas notes lightheadedness. Her blood pressure today was 84/57. Her Norvasc was discontinued by her Cardiologist 12 days ago. Her blood pressure is still low. BP Readings from Last 3 Encounters:  12/24/19 (!) 84/51  12/10/19 (!) 91/56  11/27/19 102/64     2. Vitamin D deficiency Pamela Thomas last Vit D level was low at 20.3. She is on high dose Vit D.  Assessment/Plan:   1. Hypotension, unspecified hypotension type Pamela Thomas is to check her blood pressure 3-4 times per week and send readings to Cardiology.  2. Vitamin D deficiency Low Vitamin D level contributes to fatigue and are associated with obesity, breast, and colon cancer. We will refill prescription Vitamin D for 1 month. Pamela Thomas will follow-up for routine testing of Vitamin D, at least 2-3 times per year to avoid over-replacement.  - Vitamin D, Ergocalciferol, (DRISDOL) 1.25 MG (50000 UNIT) CAPS capsule; Take 1 capsule (50,000 Units total) by mouth every 7 (seven) days.  Dispense: 4 capsule; Refill: 0  3. Class 3 severe obesity with serious comorbidity and body mass index (BMI) of 40.0 to 44.9 in adult, unspecified obesity type  (Pamela Thomas) Pamela Thomas is currently in the action stage of change. As such, her goal is to continue with weight loss efforts. She has agreed to the Category 3 Plan or keeping a food journal and adhering to recommended goals of 450-600 calories and 40 grams of protein daily.   Handouts given today: Eating Out, Journaling, and Recipes.  Exercise goals: No exercise has been prescribed at this time.  Behavioral modification strategies: increasing lean protein intake and planning for success.  Pamela Thomas has agreed to follow-up with our clinic in 2 weeks. She was informed of the importance of frequent follow-up visits to maximize her success with intensive lifestyle modifications for her multiple health conditions.   Objective:   Blood pressure (!) 84/51, pulse 66, temperature 98 F (36.7 C), temperature source Oral, height 5\' 1"  (1.549 m), weight 231 lb (104.8 kg), last menstrual period 10/10/2010, SpO2 90 %. Body mass index is 43.65 kg/m.  General: Cooperative, alert, well developed, in no acute distress. HEENT: Conjunctivae and lids unremarkable. Cardiovascular: Regular rhythm.  Lungs: Normal work of breathing. Neurologic: No focal deficits.   Lab Results  Component Value Date   CREATININE 1.06 (H) 10/14/2019   BUN 27 10/14/2019   NA 141 10/14/2019   K 4.1 10/14/2019   CL 96 10/14/2019   CO2 26 10/14/2019   Lab Results  Component Value Date   ALT 13 10/14/2019   AST 13 10/14/2019   ALKPHOS 80 10/14/2019   BILITOT 0.4 10/14/2019   Lab Results  Component Value Date   HGBA1C  7.2 (H) 10/14/2019   HGBA1C 7.1 (A) 03/20/2019   HGBA1C 7.2 (A) 01/30/2019   HGBA1C 6.2 (H) 08/13/2018   HGBA1C 6.9 01/12/2018   Lab Results  Component Value Date   INSULIN 8.7 10/14/2019   Lab Results  Component Value Date   TSH 1.360 10/14/2019   Lab Results  Component Value Date   CHOL 165 10/14/2019   HDL 36 (L) 10/14/2019   LDLCALC 77 10/14/2019   LDLDIRECT 63 01/14/2019   TRIG 319 (H) 10/14/2019    CHOLHDL 4.6 (H) 06/07/2019   Lab Results  Component Value Date   WBC 8.9 03/20/2019   HGB 12.0 03/20/2019   HCT 36.2 03/20/2019   MCV 79 03/20/2019   PLT 280 03/20/2019   No results found for: IRON, TIBC, FERRITIN  Obesity Behavioral Intervention Documentation for Insurance:   Approximately 15 minutes were spent on the discussion below.  ASK: We discussed the diagnosis of obesity with Pamela Thomas today and Pamela Thomas agreed to give [name] permission to discuss obesity behavioral modification therapy today.  ASSESS: Pamela Thomas has the diagnosis of obesity and her BMI today is 43.67. Pamela Thomas is in the action stage of change.   ADVISE: Pamela Thomas was educated on the multiple health risks of obesity as well as the benefit of weight loss to improve her health. She was advised of the need for long term treatment and the importance of lifestyle modifications to improve her current health and to decrease her risk of future health problems.  AGREE: Multiple dietary modification options and treatment options were discussed and Pamela Thomas agreed to follow the recommendations documented in the above note.  ARRANGE: Pamela Thomas was educated on the importance of frequent visits to treat obesity as outlined per CMS and USPSTF guidelines and agreed to schedule her next follow up appointment today.  Attestation Statements:   Reviewed by clinician on day of visit: allergies, medications, problem list, medical history, surgical history, family history, social history, and previous encounter notes.   Wilhemena Durie, am acting as Location manager for [employer], FNP-C.  I have reviewed the above documentation for accuracy and completeness, and I agree with the above. -  Georgianne Fick, FNP

## 2019-12-25 ENCOUNTER — Encounter: Payer: Self-pay | Admitting: Family Medicine

## 2019-12-25 ENCOUNTER — Ambulatory Visit: Payer: BC Managed Care – PPO | Admitting: Family Medicine

## 2019-12-25 ENCOUNTER — Encounter (INDEPENDENT_AMBULATORY_CARE_PROVIDER_SITE_OTHER): Payer: Self-pay | Admitting: Family Medicine

## 2019-12-25 ENCOUNTER — Other Ambulatory Visit: Payer: Self-pay

## 2019-12-25 VITALS — BP 102/64 | HR 69 | Ht 61.0 in | Wt 234.6 lb

## 2019-12-25 DIAGNOSIS — R300 Dysuria: Secondary | ICD-10-CM | POA: Diagnosis not present

## 2019-12-25 DIAGNOSIS — I10 Essential (primary) hypertension: Secondary | ICD-10-CM | POA: Diagnosis not present

## 2019-12-25 DIAGNOSIS — E1165 Type 2 diabetes mellitus with hyperglycemia: Secondary | ICD-10-CM | POA: Diagnosis not present

## 2019-12-25 DIAGNOSIS — I959 Hypotension, unspecified: Secondary | ICD-10-CM | POA: Insufficient documentation

## 2019-12-25 DIAGNOSIS — E559 Vitamin D deficiency, unspecified: Secondary | ICD-10-CM | POA: Insufficient documentation

## 2019-12-25 DIAGNOSIS — I951 Orthostatic hypotension: Secondary | ICD-10-CM | POA: Insufficient documentation

## 2019-12-25 LAB — POCT UA - MICROSCOPIC ONLY

## 2019-12-25 LAB — POCT URINALYSIS DIP (MANUAL ENTRY)
Bilirubin, UA: NEGATIVE
Blood, UA: NEGATIVE
Glucose, UA: NEGATIVE mg/dL
Ketones, POC UA: NEGATIVE mg/dL
Leukocytes, UA: NEGATIVE
Nitrite, UA: POSITIVE — AB
Protein Ur, POC: NEGATIVE mg/dL
Spec Grav, UA: 1.015 (ref 1.010–1.025)
Urobilinogen, UA: 0.2 E.U./dL
pH, UA: 5.5 (ref 5.0–8.0)

## 2019-12-25 MED ORDER — CEPHALEXIN 500 MG PO CAPS: 500.0000 mg | ORAL_CAPSULE | Freq: Three times a day (TID) | ORAL | 0 refills | Status: DC

## 2019-12-25 NOTE — Telephone Encounter (Signed)
Left message with patient to follow up and see if she had questions about AVS from last visit.

## 2019-12-26 NOTE — Assessment & Plan Note (Signed)
Following LSM and has lost 15 pounds. Congratulated. Encouraged to continue.

## 2019-12-26 NOTE — Assessment & Plan Note (Signed)
Cardiology has made some adjustments. We discussed. She is still running a little low but is asymptomatic. If she has dizziness, he needs to let cardiology know as she may need further adjustment.

## 2019-12-26 NOTE — Progress Notes (Signed)
    CHIEF COMPLAINT / HPI:  1. 4-5 days increased urinary frequency, burning.no fever. 2. Cardiology switched her to zetia from vascepa and diarrhea improved significantly. 3. DM: taking meds regualrly. No problems. Continues w LSM.   PERTINENT  PMH / PSH: I have reviewed the patient's medications, allergies, past medical and surgical history, smoking status and updated in the EMR as appropriate.   OBJECTIVE:  BP 102/64   Pulse 69   Ht 5\' 1"  (1.549 m)   Wt 234 lb 9.6 oz (106.4 kg)   LMP 10/10/2010   SpO2 98%   BMI 44.33 kg/m    ASSESSMENT / PLAN: UTI: cephalexin 5 days. Will call if not improved. 2. Preventive needs: will come for pap smear in next few months.  HYPERTENSION, BENIGN SYSTEMIC Cardiology has made some adjustments. We discussed. She is still running a little low but is asymptomatic. If she has dizziness, he needs to let cardiology know as she may need further adjustment.   Type 2 diabetes mellitus with hyperglycemia, without long-term current use of insulin ([facility]) Following LSM and has lost 15 pounds. Congratulated. Encouraged to continue.   [name] MD

## 2020-01-01 ENCOUNTER — Telehealth: Payer: Self-pay | Admitting: Cardiology

## 2020-01-01 NOTE — Telephone Encounter (Signed)
LMOM RE: F/U Visit--- AF

## 2020-01-08 ENCOUNTER — Other Ambulatory Visit: Payer: Self-pay

## 2020-01-08 ENCOUNTER — Encounter (INDEPENDENT_AMBULATORY_CARE_PROVIDER_SITE_OTHER): Payer: Self-pay | Admitting: Family Medicine

## 2020-01-08 ENCOUNTER — Ambulatory Visit (INDEPENDENT_AMBULATORY_CARE_PROVIDER_SITE_OTHER): Payer: BC Managed Care – PPO | Admitting: Family Medicine

## 2020-01-08 VITALS — BP 97/61 | HR 70 | Temp 98.1°F | Ht 61.0 in | Wt 230.0 lb

## 2020-01-08 DIAGNOSIS — E559 Vitamin D deficiency, unspecified: Secondary | ICD-10-CM | POA: Diagnosis not present

## 2020-01-08 DIAGNOSIS — E1165 Type 2 diabetes mellitus with hyperglycemia: Secondary | ICD-10-CM | POA: Diagnosis not present

## 2020-01-08 DIAGNOSIS — Z6841 Body Mass Index (BMI) 40.0 and over, adult: Secondary | ICD-10-CM | POA: Diagnosis not present

## 2020-01-08 MED ORDER — VITAMIN D (ERGOCALCIFEROL) 1.25 MG (50000 UNIT) PO CAPS: 50000.0000 [IU] | ORAL_CAPSULE | ORAL | 0 refills | Status: DC

## 2020-01-09 ENCOUNTER — Encounter (INDEPENDENT_AMBULATORY_CARE_PROVIDER_SITE_OTHER): Payer: Self-pay | Admitting: Family Medicine

## 2020-01-09 NOTE — Progress Notes (Signed)
Chief Complaint:   OBESITY Pamela Thomas is here to discuss her progress with her obesity treatment plan along with follow-up of her obesity related diagnoses. Pamela Thomas is on the Category 3 Plan and keeping a food journal and adhering to recommended goals of 450-600 calories and 40 grams of proteinat supper daily and states she is following her eating plan approximately 30-40% of the time. Pamela Thomas states she is doing 0 minutes 0 times per week.  Today's visit was #: 5 Starting weight: 246 lbs Starting date: 10/14/2019 Today's weight: 230 lbs Today's date: 01/08/2020 Total lbs lost to date: 16 Total lbs lost since last in-office visit: 1  Interim History: Pamela Thomas is packing her house to move out on May 15th. This makes adhering to plan more difficult because most of her kitchen items are packed. She denies excessive hunger. She is on the plan at breakfast and lunch, and off the plan somewhat at dinner. She skips dinner sometimes.  Subjective:   1. Vitamin D deficiency Pamela Thomas's last Vit D level was low at 20.3. She is on high dose prescription Vit D weekly.  2. Type 2 diabetes mellitus with hyperglycemia, without long-term current use of insulin ([place]) Pamela Thomas's diabetes mellitus is not well controlled. She is on metformin only. She denies hypoglycemia. She is working on limiting her simple carbohydrates.  Assessment/Plan:   1. Vitamin D deficiency Low Vitamin D level contributes to fatigue and are associated with obesity, breast, and colon cancer. We will refill prescription Vitamin D for 1 month. Pamela Thomas will follow-up for routine testing of Vitamin D, at least 2-3 times per year to avoid over-replacement. We will recheck labs at her next visit.  - Vitamin D, Ergocalciferol, (DRISDOL) 1.25 MG (50000 UNIT) CAPS capsule; Take 1 capsule (50,000 Units total) by mouth every 7 (seven) days.  Dispense: 4 capsule; Refill: 0  2. Type 2 diabetes mellitus with hyperglycemia, without long-term  current use of insulin (Pamela Thomas) Good blood sugar control is important to decrease the likelihood of diabetic complications such as nephropathy, neuropathy, limb loss, blindness, coronary artery disease, and death. Intensive lifestyle modification including diet, exercise and weight loss are the first line of treatment for diabetes. Pamela Thomas will continue metformin, and we will recheck labs at her next visit.  3. Class 3 severe obesity with serious comorbidity and body mass index (BMI) of 40.0 to 44.9 in adult, unspecified obesity type (Pamela Thomas) Pamela Thomas is currently in the action stage of change. As such, her goal is to continue with weight loss efforts. She has agreed to the Category 3 Plan and keeping a food journal and adhering to recommended goals of 450-600 calories and 40 grams of protein at supper daily.   Pamela Thomas is to have protein snack rather than skipping dinner altogether.   Exercise goals: No exercise has been prescribed at this time.  Behavioral modification strategies: increasing lean protein intake, no skipping meals and planning for success.  Pamela Thomas has agreed to follow-up with our clinic in 2 weeks. She was informed of the importance of frequent follow-up visits to maximize her success with intensive lifestyle modifications for her multiple health conditions.   Objective:   Blood pressure 97/61, pulse 70, temperature 98.1 F (36.7 C), temperature source Oral, height 5\' 1"  (1.549 m), weight 230 lb (104.3 kg), last menstrual period 10/10/2010, SpO2 96 %. Body mass index is 43.46 kg/m.  General: Cooperative, alert, well developed, in no acute distress. HEENT: Conjunctivae and lids unremarkable. Cardiovascular: Regular rhythm.  Lungs: Normal  work of breathing. Neurologic: No focal deficits.   Lab Results  Component Value Date   CREATININE 1.06 (H) 10/14/2019   BUN 27 10/14/2019   NA 141 10/14/2019   K 4.1 10/14/2019   CL 96 10/14/2019   CO2 26 10/14/2019   Lab Results   Component Value Date   ALT 13 10/14/2019   AST 13 10/14/2019   ALKPHOS 80 10/14/2019   BILITOT 0.4 10/14/2019   Lab Results  Component Value Date   HGBA1C 7.2 (H) 10/14/2019   HGBA1C 7.1 (A) 03/20/2019   HGBA1C 7.2 (A) 01/30/2019   HGBA1C 6.2 (H) 08/13/2018   HGBA1C 6.9 01/12/2018   Lab Results  Component Value Date   INSULIN 8.7 10/14/2019   Lab Results  Component Value Date   TSH 1.360 10/14/2019   Lab Results  Component Value Date   CHOL 165 10/14/2019   HDL 36 (L) 10/14/2019   LDLCALC 77 10/14/2019   LDLDIRECT 63 01/14/2019   TRIG 319 (H) 10/14/2019   CHOLHDL 4.6 (H) 06/07/2019   Lab Results  Component Value Date   WBC 8.9 03/20/2019   HGB 12.0 03/20/2019   HCT 36.2 03/20/2019   MCV 79 03/20/2019   PLT 280 03/20/2019   No results found for: IRON, TIBC, FERRITIN  Obesity Behavioral Intervention Documentation for Insurance:   Approximately 15 minutes were spent on the discussion below.  ASK: We discussed the diagnosis of obesity with Pamela Thomas today and Pamela Thomas agreed to give [name] permission to discuss obesity behavioral modification therapy today.  ASSESS: Pamela Thomas has the diagnosis of obesity and her BMI today is 43.48. Pamela Thomas is in the action stage of change.   ADVISE: Pamela Thomas was educated on the multiple health risks of obesity as well as the benefit of weight loss to improve her health. She was advised of the need for long term treatment and the importance of lifestyle modifications to improve her current health and to decrease her risk of future health problems.  AGREE: Multiple dietary modification options and treatment options were discussed and Pamela Thomas agreed to follow the recommendations documented in the above note.  ARRANGE: Pamela Thomas was educated on the importance of frequent visits to treat obesity as outlined per CMS and USPSTF guidelines and agreed to schedule her next follow up appointment today.  Attestation Statements:   Reviewed by  clinician on day of visit: allergies, medications, problem list, medical history, surgical history, family history, social history, and previous encounter notes.   Wilhemena Durie, am acting as Location manager for [employer], FNP-C.  I have reviewed the above documentation for accuracy and completeness, and I agree with the above. -  Georgianne Fick, FNP

## 2020-01-16 ENCOUNTER — Other Ambulatory Visit: Payer: Self-pay | Admitting: Internal Medicine

## 2020-01-16 DIAGNOSIS — E782 Mixed hyperlipidemia: Secondary | ICD-10-CM

## 2020-01-23 ENCOUNTER — Other Ambulatory Visit: Payer: Self-pay

## 2020-01-23 ENCOUNTER — Ambulatory Visit (INDEPENDENT_AMBULATORY_CARE_PROVIDER_SITE_OTHER): Payer: BC Managed Care – PPO | Admitting: Family Medicine

## 2020-01-23 ENCOUNTER — Encounter (INDEPENDENT_AMBULATORY_CARE_PROVIDER_SITE_OTHER): Payer: Self-pay | Admitting: Family Medicine

## 2020-01-23 VITALS — BP 98/50 | HR 63 | Temp 98.0°F | Ht 61.0 in | Wt 229.0 lb

## 2020-01-23 DIAGNOSIS — E1169 Type 2 diabetes mellitus with other specified complication: Secondary | ICD-10-CM | POA: Diagnosis not present

## 2020-01-23 DIAGNOSIS — R7989 Other specified abnormal findings of blood chemistry: Secondary | ICD-10-CM | POA: Diagnosis not present

## 2020-01-23 DIAGNOSIS — E559 Vitamin D deficiency, unspecified: Secondary | ICD-10-CM

## 2020-01-23 DIAGNOSIS — Z6841 Body Mass Index (BMI) 40.0 and over, adult: Secondary | ICD-10-CM | POA: Diagnosis not present

## 2020-01-23 DIAGNOSIS — E7849 Other hyperlipidemia: Secondary | ICD-10-CM | POA: Diagnosis not present

## 2020-01-23 DIAGNOSIS — E1165 Type 2 diabetes mellitus with hyperglycemia: Secondary | ICD-10-CM | POA: Diagnosis not present

## 2020-01-23 NOTE — Progress Notes (Signed)
Chief Complaint:   OBESITY Pamela Thomas is here to discuss her progress with her obesity treatment plan along with follow-up of her obesity related diagnoses. [name] is on the Category 3 Plan and states she is following her eating plan approximately 25% of the time. [name] states she is exercising 0 minutes 0 times per week.  Today's visit was #: 8 Starting weight: 246 lbs Starting date: 10/14/2019 Today's weight: 229 lbs Today's date: 01/23/2020 Total lbs lost to date: 17 Total lbs lost since last in-office visit: 1  Interim History: Pamela Thomas is living in a camper until she moves into her new house. Life has been very busy, but she is doing the best she can. She is using some protein shakes instead of meals at times.  Subjective:   Type 2 diabetes mellitus with hyperglycemia, without long-term current use of insulin ([place]).  A1c not at goal. Pamela Thomas is not checking her CBG's at this time. She is on metformin.  Lab Results  Component Value Date   HGBA1C 7.2 (H) 10/14/2019   HGBA1C 7.1 (A) 03/20/2019   HGBA1C 7.2 (A) 01/30/2019   Lab Results  Component Value Date   LDLCALC 77 10/14/2019   CREATININE 1.06 (H) 10/14/2019   Lab Results  Component Value Date   INSULIN 8.7 10/14/2019   Abnormal thyroid stimulating hormone (TSH) level.  T3 was elevated at 204 on 10/14/2019. She reports feeling fine. Lab Results  Component Value Date   TSH 1.360 10/14/2019   T3TOTAL 204 (H) 10/14/2019    Vitamin D deficiency. Last Vitamin D level low at 20.3 on 10/14/2019. Pamela Thomas is on prescription Vitamin D.  Other hyperlipidemia. Pamela Thomas is on Lipitor and Zetia. Triglycerides elevated at 319 on 10/14/2019, HDL low at 36, with LDL nearly at goal at 77. She was previously on Vascepa but was unable to tloerate it. She has history of STEMI, CHF, carotid atherosclerosis.  Lab Results  Component Value Date   CHOL 165 10/14/2019   HDL 36 (L) 10/14/2019   LDLCALC 77 10/14/2019   LDLDIRECT 63 01/14/2019   TRIG 319 (H) 10/14/2019   CHOLHDL 4.6 (H) 06/07/2019   Lab Results  Component Value Date   ALT 13 10/14/2019   AST 13 10/14/2019   ALKPHOS 80 10/14/2019   BILITOT 0.4 10/14/2019   The ASCVD Risk score Pamela Thomas DC Jr., et al., 2013) failed to calculate for the following reasons:   The patient has a prior MI or stroke diagnosis  Assessment/Plan:   Type 2 diabetes mellitus with hyperglycemia, without long-term current use of insulin ([place]). Good blood sugar control is important to decrease the likelihood of diabetic complications such as nephropathy, neuropathy, limb loss, blindness, coronary artery disease, and death. Intensive lifestyle modification including diet, exercise and weight loss are the first line of treatment for diabetes. Comprehensive metabolic panel, Hemoglobin A1c, Insulin, random labs ordered today.  Abnormal thyroid stimulating hormone (TSH) level. TSH, T4, free, T3 levels ordered today.  Vitamin D deficiency. Low Vitamin D level contributes to fatigue and are associated with obesity, breast, and colon cancer. She agrees to continue to take prescription Vitamin D and VITAMIN D 25 Hydroxy (Vit-D Deficiency, Fractures) level ordered today.  Other hyperlipidemia. Cardiovascular risk and specific lipid/LDL goals reviewed.  We discussed several lifestyle modifications today and Leatrice will continue to work on diet, exercise and weight loss efforts. Orders and follow up as documented in patient record. She will continue her medications as directed. Lipid Panel With  LDL/HDL Ratio ordered today.   Class 3 severe obesity with serious comorbidity and body mass index (BMI) of 40.0 to 44.9 in adult, unspecified obesity type ([place]).  Pamela Thomas is currently in the action stage of change. As such, her goal is to continue with weight loss efforts. She has agreed to the Category 3 Plan and will journal 450-600 calories and 40 grams of protein at supper if dining out.    Exercise goals: All adults should avoid inactivity. Some physical activity is better than none, and adults who participate in any amount of physical activity gain some health benefits.  Behavioral modification strategies: increasing lean protein intake, decreasing simple carbohydrates and no skipping meals.  Pamela Thomas has agreed to follow-up with our clinic in 2-3 weeks. She was informed of the importance of frequent follow-up visits to maximize her success with intensive lifestyle modifications for her multiple health conditions.   Pamela Thomas was informed we would discuss her lab results at her next visit unless there is a critical issue that needs to be addressed sooner. Pamela Thomas agreed to keep her next visit at the agreed upon time to discuss these results.  Objective:   Blood pressure (!) 98/50, pulse 63, temperature 98 F (36.7 C), temperature source Oral, height 5\' 1"  (1.549 m), weight 229 lb (103.9 kg), last menstrual period 10/10/2010, SpO2 97 %. Body mass index is 43.27 kg/m.  General: Cooperative, alert, well developed, in no acute distress. HEENT: Conjunctivae and lids unremarkable. Cardiovascular: Regular rhythm.  Lungs: Normal work of breathing. Neurologic: No focal deficits.   Lab Results  Component Value Date   CREATININE 1.06 (H) 10/14/2019   BUN 27 10/14/2019   NA 141 10/14/2019   K 4.1 10/14/2019   CL 96 10/14/2019   CO2 26 10/14/2019   Lab Results  Component Value Date   ALT 13 10/14/2019   AST 13 10/14/2019   ALKPHOS 80 10/14/2019   BILITOT 0.4 10/14/2019   Lab Results  Component Value Date   HGBA1C 7.2 (H) 10/14/2019   HGBA1C 7.1 (A) 03/20/2019   HGBA1C 7.2 (A) 01/30/2019   HGBA1C 6.2 (H) 08/13/2018   HGBA1C 6.9 01/12/2018   Lab Results  Component Value Date   INSULIN 8.7 10/14/2019   Lab Results  Component Value Date   TSH 1.360 10/14/2019   Lab Results  Component Value Date   CHOL 165 10/14/2019   HDL 36 (L) 10/14/2019   LDLCALC 77 10/14/2019    LDLDIRECT 63 01/14/2019   TRIG 319 (H) 10/14/2019   CHOLHDL 4.6 (H) 06/07/2019   Lab Results  Component Value Date   WBC 8.9 03/20/2019   HGB 12.0 03/20/2019   HCT 36.2 03/20/2019   MCV 79 03/20/2019   PLT 280 03/20/2019   No results found for: IRON, TIBC, FERRITIN  Obesity Behavioral Intervention Documentation for Insurance:   Approximately 15 minutes were spent on the discussion below.  ASK: We discussed the diagnosis of obesity with Mccartney today and Suly agreed to give [name] permission to discuss obesity behavioral modification therapy today.  ASSESS: Nyna has the diagnosis of obesity and her BMI today is 43.4. Kenyata is in the action stage of change.   ADVISE: Embree was educated on the multiple health risks of obesity as well as the benefit of weight loss to improve her health. She was advised of the need for long term treatment and the importance of lifestyle modifications to improve her current health and to decrease her risk of future health problems.  AGREE: Multiple dietary modification options and treatment options were discussed and Shruthi agreed to follow the recommendations documented in the above note.  ARRANGE: Paisly was educated on the importance of frequent visits to treat obesity as outlined per CMS and USPSTF guidelines and agreed to schedule her next follow up appointment today.  Attestation Statements:   Reviewed by clinician on day of visit: allergies, medications, problem list, medical history, surgical history, family history, social history, and previous encounter notes.  IMichaelene Song, am acting as Location manager for [employer], FNP   I have reviewed the above documentation for accuracy and completeness, and I agree with the above. -  Georgianne Fick, FNP

## 2020-01-24 LAB — HEMOGLOBIN A1C
Est. average glucose Bld gHb Est-mCnc: 140 mg/dL
Hgb A1c MFr Bld: 6.5 % — ABNORMAL HIGH (ref 4.8–5.6)

## 2020-01-24 LAB — COMPREHENSIVE METABOLIC PANEL
ALT: 17 IU/L (ref 0–32)
AST: 18 IU/L (ref 0–40)
Albumin/Globulin Ratio: 1.5 (ref 1.2–2.2)
Albumin: 4.4 g/dL (ref 3.8–4.8)
Alkaline Phosphatase: 81 IU/L (ref 39–117)
BUN/Creatinine Ratio: 25 (ref 12–28)
BUN: 35 mg/dL — ABNORMAL HIGH (ref 8–27)
Bilirubin Total: 0.4 mg/dL (ref 0.0–1.2)
CO2: 27 mmol/L (ref 20–29)
Calcium: 10.5 mg/dL — ABNORMAL HIGH (ref 8.7–10.3)
Chloride: 97 mmol/L (ref 96–106)
Creatinine, Ser: 1.41 mg/dL — ABNORMAL HIGH (ref 0.57–1.00)
GFR calc Af Amer: 45 mL/min/{1.73_m2} — ABNORMAL LOW (ref >59)
GFR calc non Af Amer: 39 mL/min/{1.73_m2} — ABNORMAL LOW (ref >59)
Globulin, Total: 3 g/dL (ref 1.5–4.5)
Glucose: 131 mg/dL — ABNORMAL HIGH (ref 65–99)
Potassium: 4.2 mmol/L (ref 3.5–5.2)
Sodium: 141 mmol/L (ref 134–144)
Total Protein: 7.4 g/dL (ref 6.0–8.5)

## 2020-01-24 LAB — VITAMIN D 25 HYDROXY (VIT D DEFICIENCY, FRACTURES): Vit D, 25-Hydroxy: 28.5 ng/mL — ABNORMAL LOW (ref 30.0–100.0)

## 2020-01-24 LAB — LIPID PANEL WITH LDL/HDL RATIO
Cholesterol, Total: 141 mg/dL (ref 100–199)
HDL: 33 mg/dL — ABNORMAL LOW (ref >39)
LDL Chol Calc (NIH): 62 mg/dL (ref 0–99)
LDL/HDL Ratio: 1.9 ratio (ref 0.0–3.2)
Triglycerides: 292 mg/dL — ABNORMAL HIGH (ref 0–149)
VLDL Cholesterol Cal: 46 mg/dL — ABNORMAL HIGH (ref 5–40)

## 2020-01-24 LAB — T4, FREE: Free T4: 1.66 ng/dL (ref 0.82–1.77)

## 2020-01-24 LAB — TSH: TSH: 2.65 u[IU]/mL (ref 0.450–4.500)

## 2020-01-24 LAB — INSULIN, RANDOM: INSULIN: 14.6 u[IU]/mL (ref 2.6–24.9)

## 2020-01-24 LAB — T3: T3, Total: 208 ng/dL — ABNORMAL HIGH (ref 71–180)

## 2020-02-04 ENCOUNTER — Encounter: Payer: Self-pay | Admitting: Cardiology

## 2020-02-04 NOTE — Progress Notes (Signed)
Cardiology Office Note   Date:  02/06/2020   ID:  Pamela, Thomas Pamela Thomas, MRN [id number]  PCP:  Dickie La, MD  Cardiologist:   Minus Breeding, MD   Chief Complaint  Patient presents with  . Dizziness      History of Present Illness: Pamela Thomas is a 64 y.o. female who presents for evaluation of coronary disease.  She had an inferior infarct in [place].   She had an occluded right coronary artery.  She also had LAD 80% stenosis proximally at the takeoff of a prominent and tortuous diagonal branch.  Circumflex is very small and free of high-grade disease.  Her EF was well preserved with some probable mild inferior hypokinesis.  She had overlapping Synergy stents.  It was suggested that she have elective PCI of her LAD versus a LIMA.  She is now status post CABG.    Since I last saw her she has done well.  She has been in [facility].  She lost about 20 pounds.   She is busy remodeling her house.  She has had low blood pressures sometimes with systolics in the 08M.  She has had dizziness.  Some of this is orthostatic.  She is not had any frank syncope but she has come a little bit close. The patient denies any new symptoms such as chest discomfort, neck or arm discomfort. There has been no new shortness of breath, PND or orthopnea. There have been no reported palpitations, presyncope or syncope.    Past Medical History:  Diagnosis Date  . ALLERGIC RHINITIS, SEASONAL   . Anxiety   . Back pain   . BCC (basal cell carcinoma of skin) 2005   Nose [facility])  . Constipation   . Coronary artery disease   . Depression   . Diabetes mellitus without complication ([place])   . DYSLIPIDEMIA   . Dysrhythmia    palpitations-evaluated by Dr Johnsie Cancel 6/12/eccho 6/13  . Glucose intolerance (impaired glucose tolerance)   . History of blood transfusion   . Hypertension   . Joint pain   . OBESITY, NOS   . Osteoarthritis    rt knee  .  PLANTAR FASCIITIS, BILATERAL   . Vertigo   . Vitamin D deficiency     Past Surgical History:  Procedure Laterality Date  . BREAST BIOPSY  05/27/03   left  . CARDIAC CATHETERIZATION    . CARPAL TUNNEL RELEASE  05/27/03   Right  . CESAREAN SECTION  05/27/03  . CORONARY ARTERY BYPASS GRAFT N/A 08/17/2018   Procedure: CORONARY ARTERY BYPASS GRAFTING (CABG) TIMES TWO: LIMA to LAD, SVG to RAMUS INTERMEDIATE)  USING LEFT INTERNAL MAMMARY ARTERY AND RIGHT GREATER SAPHENOUS VEIN HARVESTED ENDOSCOPICALLY.;  Surgeon: [name], MD;  Location: [facility];  Service: Open Heart Surgery;  Laterality: N/A;  . KNEE SURGERY     left- removal fatty tumor  . ROTATOR CUFF REPAIR     Bilateral  . TEE WITHOUT CARDIOVERSION N/A 08/17/2018   Procedure: TRANSESOPHAGEAL ECHOCARDIOGRAM (TEE);  Surgeon: [name], MD;  Location: [facility];  Service: Open Heart Surgery;  Laterality: N/A;  . TOTAL KNEE ARTHROPLASTY  10/10/2012   Procedure: TOTAL KNEE ARTHROPLASTY;  Surgeon: Gearlean Alf, MD;  Location: [facility];  Service: Orthopedics;  Laterality: Right;     Current Outpatient Medications  Medication Sig Dispense Refill  . aspirin EC 81 MG tablet Take 1 tablet (81 mg total) by mouth  daily. 100 tablet 3  . atorvastatin (LIPITOR) 40 MG tablet TAKE 1 TABLET (40 MG TOTAL) BY MOUTH DAILY. NEED OFFICE VISIT FOR FUTURE REFILL. 90 tablet 1  . candesartan (ATACAND) 16 MG tablet Take 1 tablet (16 mg total) by mouth daily. 90 tablet 3  . chlorthalidone (HYGROTON) 25 MG tablet TAKE 1 TABLET BY MOUTH EVERY DAY 30 tablet 5  . ezetimibe (ZETIA) 10 MG tablet Take 1 tablet (10 mg total) by mouth daily. 90 tablet 3  . FLUoxetine (PROZAC) 10 MG capsule Take one by mouth daily in addition to the one tab of 20 mg fluoxetine for total 30 mg a day 90 capsule 3  . FLUoxetine (PROZAC) 20 MG tablet Take 1 tablet (20 mg total) by mouth daily. 90 tablet 3  . furosemide (LASIX) 20 MG tablet Take 1 tablet (20 mg total) by mouth daily.  MAY TAKE AN ADDITIONAL TABLET AS NEEDED. 60 tablet 11  . KLOR-CON M20 20 MEQ tablet TAKE 1 TABLET BY MOUTH EVERY DAY 90 tablet 3  . metFORMIN (GLUCOPHAGE) 1000 MG tablet Take 1 tablet (1,000 mg total) by mouth 2 (two) times daily with a meal. 180 tablet 3  . metoprolol succinate (TOPROL-XL) 50 MG 24 hr tablet Take 1 tablet (50 mg total) by mouth 2 (two) times daily. Take with or immediately following a meal. 180 tablet 3  . Vitamin D, Ergocalciferol, (DRISDOL) 1.25 MG (50000 UNIT) CAPS capsule Take 1 capsule (50,000 Units total) by mouth every 7 (seven) days. 4 capsule 0   No current facility-administered medications for this visit.    Allergies:   Morphine and related, Statins, and Oxycodone    ROS:  Please see the history of present illness.   Otherwise, review of systems are positive for none.   All other systems are reviewed and negative.    PHYSICAL EXAM: VS:  BP 100/60   Pulse 69   Temp (!) 96.1 F (35.6 C)   Ht 5\' 2"  (1.575 m)   Wt 234 lb 3.2 oz (106.2 kg)   LMP 10/10/2010   SpO2 96%   BMI 42.84 kg/m  , BMI Body mass index is 42.84 kg/m. GENERAL:  Well appearing NECK:  No jugular venous distention, waveform within normal limits, carotid upstroke brisk and symmetric, no bruits, no thyromegaly LUNGS:  Clear to auscultation bilaterally CHEST:  Well healed sternotomy scar. HEART:  PMI not displaced or sustained,S1 and S2 within normal limits, no S3, no S4, no clicks, no rubs, soft apical and right upper sternal border systolic early peaking murmur, no diastolic murmurs ABD:  Flat, positive bowel sounds normal in frequency in pitch, no bruits, no rebound, no guarding, no midline pulsatile mass, no hepatomegaly, no splenomegaly EXT:  2 plus pulses throughout, no edema, no cyanosis no clubbing   EKG:  EKG is  ordered today. Sinus rhythm, rate 69, axis within normal limits, intervals within normal limits, no acute ST-T wave changes.   Recent Labs: 03/20/2019: BNP 208.4;  Hemoglobin 12.0; Platelets 280 01/23/2020: ALT 17; BUN 35; Creatinine, Ser 1.41; Potassium 4.2; Sodium 141; TSH 2.650    Lipid Panel    Component Value Date/Time   CHOL 141 01/23/2020 1147   TRIG 292 (H) 01/23/2020 1147   HDL 33 (L) 01/23/2020 1147   CHOLHDL 4.6 (H) 06/07/2019 0947   CHOLHDL 5.5 05/11/2011 0932   VLDL NOT CALC 05/11/2011 0932   LDLCALC 62 01/23/2020 1147   LDLDIRECT 63 01/14/2019 1005   LDLDIRECT 76 07/27/2016 1048  Wt Readings from Last 3 Encounters:  02/06/20 234 lb 3.2 oz (106.2 kg)  01/23/20 229 lb (103.9 kg)  01/08/20 230 lb (104.3 kg)    Lab Results  Component Value Date   HGBA1C 6.5 (H) 01/23/2020    Other studies Reviewed: Additional studies/ records that were reviewed today include: Lab Review of the above records demonstrates:  Please see elsewhere in the note.     ASSESSMENT AND PLAN:  CAD:   She is status post CABG. she had no anginal symptoms.  We are pursuing aggressive risk reduction.  DYSLIPIDEMIA:   LDL most recently was 62.  Triglycerides 292, HDL 33.  She is being managed by Dr. Debara Pickett.  No change in therapy  Aortic stenosis:  She has mild AS.  This was mild and I suspect no change.  We will follow this clinically.   DM:   A1c was 6.5 which was down from 7.2.  Given this no change in therapy.  CKD II: She had a mildly elevated creatinine that we will watch.  HTN: Blood pressure is running low.  I will reduce her Atacand to 16 mg daily.   OBESITY:    I am proud of her weight loss and encouraged more.   CAROTID STENOSIS:  40 - 59% on the right and 60 -79% on the left.  She will have this followed again in January.  COVID EDUCATION: She has had her vaccine.    Current medicines are reviewed at length with the patient today.  The patient does not have concerns regarding medicines.  The following changes have been made:   As above  Labs/ tests ordered today include: None  Orders Placed This Encounter  Procedures  . EKG  12-Lead     Disposition:   FU with me in 12 months.    Signed, Minus Breeding, MD  02/06/2020 9:26 AM    [facility]

## 2020-02-06 ENCOUNTER — Encounter: Payer: Self-pay | Admitting: Cardiology

## 2020-02-06 ENCOUNTER — Ambulatory Visit: Payer: BC Managed Care – PPO | Admitting: Cardiology

## 2020-02-06 ENCOUNTER — Other Ambulatory Visit: Payer: Self-pay

## 2020-02-06 VITALS — BP 100/60 | HR 69 | Temp 96.1°F | Ht 62.0 in | Wt 234.2 lb

## 2020-02-06 DIAGNOSIS — E785 Hyperlipidemia, unspecified: Secondary | ICD-10-CM

## 2020-02-06 DIAGNOSIS — E118 Type 2 diabetes mellitus with unspecified complications: Secondary | ICD-10-CM

## 2020-02-06 DIAGNOSIS — I2510 Atherosclerotic heart disease of native coronary artery without angina pectoris: Secondary | ICD-10-CM | POA: Diagnosis not present

## 2020-02-06 DIAGNOSIS — R0602 Shortness of breath: Secondary | ICD-10-CM

## 2020-02-06 DIAGNOSIS — I10 Essential (primary) hypertension: Secondary | ICD-10-CM

## 2020-02-06 DIAGNOSIS — I350 Nonrheumatic aortic (valve) stenosis: Secondary | ICD-10-CM

## 2020-02-06 MED ORDER — FUROSEMIDE 20 MG PO TABS: 20.0000 mg | ORAL_TABLET | Freq: Every day | ORAL | 11 refills | Status: DC

## 2020-02-06 MED ORDER — CANDESARTAN CILEXETIL 16 MG PO TABS
16.0000 mg | ORAL_TABLET | Freq: Every day | ORAL | 3 refills | Status: DC
Start: 2020-02-06 — End: 2021-01-14

## 2020-02-06 NOTE — Patient Instructions (Signed)
Medication Instructions:  DECREASE ATACAND TO 16MG  DAILY DECREASE LASIX TO 20MG  DAILY, MAY TAKE AN ADDITIONAL TABLET DAILY AS NEEDED *If you need a refill on your cardiac medications before your next appointment, please call your pharmacy*  Lab Work: NONE ORDERED THIS VISIT  Testing/Procedures: Your physician has requested that you have a carotid duplex IN [place]. This test is an ultrasound of the carotid arteries in your neck. It looks at blood flow through these arteries that supply the brain with blood. Allow one hour for this exam. There are no restrictions or special instructions.  Follow-Up: At [facility], you and your health needs are our priority.  As part of our continuing mission to provide you with exceptional heart care, we have created designated Provider Care Teams.  These Care Teams include your primary Cardiologist (physician) and Advanced Practice Providers (APPs -  Physician Assistants and Nurse Practitioners) who all work together to provide you with the care you need, when you need it.  Your next appointment:   12 month(s) You will receive a reminder letter in the mail two months in advance. If you don't receive a letter, please call our office to schedule the follow-up appointment.  The format for your next appointment:   In Person  Provider:   Minus Breeding, MD

## 2020-02-07 NOTE — Addendum Note (Signed)
Addended by: Warren Danes on: 02/07/2020 04:58 PM   Modules accepted: Orders

## 2020-02-09 ENCOUNTER — Other Ambulatory Visit (INDEPENDENT_AMBULATORY_CARE_PROVIDER_SITE_OTHER): Payer: Self-pay | Admitting: Family Medicine

## 2020-02-09 DIAGNOSIS — E559 Vitamin D deficiency, unspecified: Secondary | ICD-10-CM

## 2020-02-13 ENCOUNTER — Ambulatory Visit (INDEPENDENT_AMBULATORY_CARE_PROVIDER_SITE_OTHER): Payer: BC Managed Care – PPO | Admitting: Family Medicine

## 2020-02-13 ENCOUNTER — Encounter (INDEPENDENT_AMBULATORY_CARE_PROVIDER_SITE_OTHER): Payer: Self-pay | Admitting: Family Medicine

## 2020-02-13 ENCOUNTER — Other Ambulatory Visit: Payer: Self-pay

## 2020-02-13 VITALS — BP 122/69 | HR 78 | Temp 98.4°F | Ht 62.0 in | Wt 228.0 lb

## 2020-02-13 DIAGNOSIS — Z6841 Body Mass Index (BMI) 40.0 and over, adult: Secondary | ICD-10-CM

## 2020-02-13 DIAGNOSIS — E559 Vitamin D deficiency, unspecified: Secondary | ICD-10-CM

## 2020-02-13 DIAGNOSIS — I10 Essential (primary) hypertension: Secondary | ICD-10-CM | POA: Diagnosis not present

## 2020-02-13 MED ORDER — VITAMIN D (ERGOCALCIFEROL) 1.25 MG (50000 UNIT) PO CAPS: 50000.0000 [IU] | ORAL_CAPSULE | ORAL | 0 refills | Status: DC

## 2020-02-13 NOTE — Progress Notes (Signed)
Chief Complaint:   OBESITY Pamela Thomas is here to discuss her progress with her obesity treatment plan along with follow-up of her obesity related diagnoses. Pamela Thomas is on the Category 3 Plan and keeping a food journal and adhering to recommended goals of 450-600 calories and 40 grams of proteinat supper daily and states she is following her eating plan approximately 25-30% of the time. Pamela Thomas states she is doing 0 minutes 0 times per week.  Today's visit was #: 9 Starting weight: 246 lbs Starting date: 10/14/2019 Today's weight: 228 lbs Today's date: 02/13/2020 Total lbs lost to date: 18 Total lbs lost since last in-office visit: 1  Interim History: Pamela Thomas is living in a camper and is waiting to move into a house she is renovating. She has struggled to stick to the plan due to circumstances. She is getting her protein in mostly. She is not always getting lunch in.  Subjective:   1. Essential hypertension Pamela Thomas's blood pressure are much better. She had been hypotensive. Her Cardiologist reduced her dosages on Lasix and Atacand. She notes her fatigue has improved. BP Readings from Last 3 Encounters:  02/13/20 122/69  02/06/20 100/60  01/23/20 (!) 98/50     2. Vitamin D deficiency Pamela Thomas's Vit D level has increased to 28.5 from 20.3. She is on prescription Vit D. I discussed labs with the patient today.  Assessment/Plan:   1. Essential hypertension Pamela Thomas will continue all of her medications, and will continue working on healthy weight loss and exercise to improve blood pressure control. We will watch for signs of hypotension as she continues her lifestyle modifications.   2. Vitamin D deficiency Low Vitamin D level contributes to fatigue and are associated with obesity, breast, and colon cancer. We will refill prescription Vitamin D for 1 month. Pamela Thomas will follow-up for routine testing of Vitamin D, at least 2-3 times per year to avoid over-replacement.  - Vitamin D,  Ergocalciferol, (DRISDOL) 1.25 MG (50000 UNIT) CAPS capsule; Take 1 capsule (50,000 Units total) by mouth every 7 (seven) days.  Dispense: 4 capsule; Refill: 0  3. Class 3 severe obesity with serious comorbidity and body mass index (BMI) of 40.0 to 44.9 in adult, unspecified obesity type (HCC) Pamela Thomas is currently in the action stage of change. As such, her goal is to continue with weight loss efforts. She has agreed to the Category 3 Plan and keeping a food journal and adhering to recommended goals of 450-600 calories and 40 grams of protein at supper daily.   Exercise goals: Pamela Thomas is to walk for 30 minutes 5 times per week.  Behavioral modification strategies: increasing lean protein intake and decreasing simple carbohydrates.  Pamela Thomas has agreed to follow-up with our clinic in 3 weeks. She was informed of the importance of frequent follow-up visits to maximize her success with intensive lifestyle modifications for her multiple health conditions.   Objective:   Blood pressure 122/69, pulse 78, temperature 98.4 F (36.9 C), temperature source Oral, height 5\' 2"  (1.575 m), weight 228 lb (103.4 kg), last menstrual period 10/10/2010, SpO2 94 %. Body mass index is 41.7 kg/m.  General: Cooperative, alert, well developed, in no acute distress. HEENT: Conjunctivae and lids unremarkable. Cardiovascular: Regular rhythm.  Lungs: Normal work of breathing. Neurologic: No focal deficits.   Lab Results  Component Value Date   CREATININE 1.41 (H) 01/23/2020   BUN 35 (H) 01/23/2020   NA 141 01/23/2020   K 4.2 01/23/2020   CL 97 01/23/2020  CO2 27 01/23/2020   Lab Results  Component Value Date   ALT 17 01/23/2020   AST 18 01/23/2020   ALKPHOS 81 01/23/2020   BILITOT 0.4 01/23/2020   Lab Results  Component Value Date   HGBA1C 6.5 (H) 01/23/2020   HGBA1C 7.2 (H) 10/14/2019   HGBA1C 7.1 (A) 03/20/2019   HGBA1C 7.2 (A) 01/30/2019   HGBA1C 6.2 (H) 08/13/2018   Lab Results  Component  Value Date   INSULIN 14.6 01/23/2020   INSULIN 8.7 10/14/2019   Lab Results  Component Value Date   TSH 2.650 01/23/2020   Lab Results  Component Value Date   CHOL 141 01/23/2020   HDL 33 (L) 01/23/2020   LDLCALC 62 01/23/2020   LDLDIRECT 63 01/14/2019   TRIG 292 (H) 01/23/2020   CHOLHDL 4.6 (H) 06/07/2019   Lab Results  Component Value Date   WBC 8.9 03/20/2019   HGB 12.0 03/20/2019   HCT 36.2 03/20/2019   MCV 79 03/20/2019   PLT 280 03/20/2019   No results found for: IRON, TIBC, FERRITIN  Obesity Behavioral Intervention Documentation for Insurance:   Approximately 15 minutes were spent on the discussion below.  ASK: We discussed the diagnosis of obesity with Zariel today and Pamela Thomas agreed to give [name] permission to discuss obesity behavioral modification therapy today.  ASSESS: Pamela Thomas has the diagnosis of obesity and her BMI today is 41.69. Pamela Thomas is in the action stage of change.   ADVISE: Pamela Thomas was educated on the multiple health risks of obesity as well as the benefit of weight loss to improve her health. She was advised of the need for long term treatment and the importance of lifestyle modifications to improve her current health and to decrease her risk of future health problems.  AGREE: Multiple dietary modification options and treatment options were discussed and Pamela Thomas agreed to follow the recommendations documented in the above note.  ARRANGE: Pamela Thomas was educated on the importance of frequent visits to treat obesity as outlined per CMS and USPSTF guidelines and agreed to schedule her next follow up appointment today.  Attestation Statements:   Reviewed by clinician on day of visit: allergies, medications, problem list, medical history, surgical history, family history, social history, and previous encounter notes.   Wilhemena Durie, am acting as Location manager for [employer], FNP-C.  I have reviewed the above documentation for accuracy and  completeness, and I agree with the above. -  Georgianne Fick, FNP

## 2020-03-09 ENCOUNTER — Ambulatory Visit (INDEPENDENT_AMBULATORY_CARE_PROVIDER_SITE_OTHER): Payer: BC Managed Care – PPO | Admitting: Family Medicine

## 2020-03-09 ENCOUNTER — Other Ambulatory Visit: Payer: Self-pay

## 2020-03-11 ENCOUNTER — Other Ambulatory Visit: Payer: Self-pay | Admitting: Cardiology

## 2020-03-11 MED ORDER — CHLORTHALIDONE 25 MG PO TABS: 25.0000 mg | ORAL_TABLET | Freq: Every day | ORAL | 11 refills | Status: DC

## 2020-03-11 NOTE — Telephone Encounter (Signed)
Rx(s) sent to pharmacy electronically.  

## 2020-03-11 NOTE — Telephone Encounter (Signed)
   *  STAT* If patient is at the pharmacy, call can be transferred to refill team.   1. Which medications need to be refilled? (please list name of each medication and dose if known)   chlorthalidone (HYGROTON) 25 MG tablet     2. Which pharmacy/location (including street and city if local pharmacy) is medication to be sent to? [id number] - [place], [place]  3. Do they need a 30 day or 90 day supply? 30 days  Pt is completely out of meds

## 2020-03-12 ENCOUNTER — Ambulatory Visit
Admission: EM | Admit: 2020-03-12 | Discharge: 2020-03-12 | Disposition: A | Discharge status: Home / Self Care | Payer: BC Managed Care – PPO

## 2020-03-12 ENCOUNTER — Other Ambulatory Visit: Payer: Self-pay

## 2020-03-12 ENCOUNTER — Encounter: Payer: Self-pay | Admitting: Emergency Medicine

## 2020-03-12 DIAGNOSIS — M5441 Lumbago with sciatica, right side: Secondary | ICD-10-CM | POA: Diagnosis not present

## 2020-03-12 MED ORDER — PREDNISONE 50 MG PO TABS
50.0000 mg | ORAL_TABLET | Freq: Every day | ORAL | 0 refills | Status: DC
Start: 2020-03-12 — End: 2020-11-18

## 2020-03-12 MED ORDER — TIZANIDINE HCL 2 MG PO TABS
2.0000 mg | ORAL_TABLET | Freq: Three times a day (TID) | ORAL | 0 refills | Status: DC | PRN
Start: 2020-03-12 — End: 2020-11-18

## 2020-03-12 MED ORDER — KETOROLAC TROMETHAMINE 15 MG/ML IJ SOLN
15.0000 mg | Freq: Once | INTRAMUSCULAR | Status: AC
  Administered 2020-03-12: 15 mg via INTRAMUSCULAR

## 2020-03-12 NOTE — Discharge Instructions (Signed)
Toradol injection given in office today. Prednisone as directed. Tizanidine as needed, this can make you drowsy, so do not take if you are going to drive, operate heavy machinery, or make important decisions. Ice/heat compresses as needed. Follow up with PCP/orthopedics if symptoms worsen, changes for reevaluation. If experience numbness/tingling of the inner thighs, loss of bladder or bowel control, go to the emergency department for evaluation.

## 2020-03-12 NOTE — ED Provider Notes (Signed)
[facility]    CSN: [id number] Arrival date & time: 03/12/20  1903      History   Chief Complaint Chief Complaint  Patient presents with  . Back Pain    HPI Pamela Thomas is a 64 y.o. female.   64 year old female comes in for 1 week history of back pain that worsened this morning. States had some right lower back pain intermittently for the past week. Had been remodeling the house with heavy lifting and moving furniture. Due to remodeling, had been sleeping in the RV as well. Switched back to normal mattress last night, woke up this morning with right low back/buttock pain that is constant, worse with movement. Certain movement causes pain radiating down right leg. Has baseline numbness/tingling to bilateral foot without any changes. No saddle anesthesia, loss of bladder or bowel control. Tylenol without relief  Patient states had fallen a few days ago, hitting head, bilateral arms. No loss of consciousness. No back pain after fall. States has not had problems due to the fall.     Past Medical History:  Diagnosis Date  . ALLERGIC RHINITIS, SEASONAL   . Anxiety   . Back pain   . BCC (basal cell carcinoma of skin) 2005   Nose [facility])  . Constipation   . Coronary artery disease   . Depression   . Diabetes mellitus without complication ([place])   . DYSLIPIDEMIA   . Dysrhythmia    palpitations-evaluated by Dr Johnsie Cancel 6/12/eccho 6/13  . Glucose intolerance (impaired glucose tolerance)   . History of blood transfusion   . Hypertension   . Joint pain   . OBESITY, NOS   . Osteoarthritis    rt knee  . PLANTAR FASCIITIS, BILATERAL   . Vertigo   . Vitamin D deficiency     Patient Active Problem List   Diagnosis Date Noted  . Hypotension 12/25/2019  . Vitamin D deficiency 12/25/2019  . Hypertriglyceridemia 10/17/2019  . Class 3 severe obesity due to excess calories with body mass index (BMI) of 45.0 to 49.9 in adult [facility]) 10/15/2019  .  Dyslipidemia 07/25/2019  . Nonrheumatic aortic valve stenosis 07/25/2019  . Bilateral carotid artery stenosis 04/26/2019  . Dyspnea 03/20/2019  . Diastolic CHF ([place]) 44/09/270  . Dysthymia 01/30/2019  . S/P CABG x 1 10/15/2018  . Carotid atherosclerosis, bilateral 10/15/2018  . Coronary artery disease 08/17/2018  . STEMI (ST elevation myocardial infarction) ([place]) 07/16/2018  . Hypertension 02/22/2017  . Visit for preventive health examination 07/29/2016  . Right knee DJD, s/p TKR 06/21/2012  . Family history of malignant melanoma 06/15/2011  . Type 2 diabetes mellitus with hyperglycemia, without long-term current use of insulin ([place]) 12/09/2009  . CARDIAC MURMUR 05/27/2009  . Hyperlipidemia associated with type 2 diabetes mellitus ([place]) 05/21/2007  . ALLERGIC RHINITIS, SEASONAL 01/08/2007  . Obesity 11/09/2006  . HYPERTENSION, BENIGN SYSTEMIC 11/09/2006    Past Surgical History:  Procedure Laterality Date  . BREAST BIOPSY  05/27/03   left  . CARDIAC CATHETERIZATION    . CARPAL TUNNEL RELEASE  05/27/03   Right  . CESAREAN SECTION  05/27/03  . CORONARY ARTERY BYPASS GRAFT N/A 08/17/2018   Procedure: CORONARY ARTERY BYPASS GRAFTING (CABG) TIMES TWO: LIMA to LAD, SVG to RAMUS INTERMEDIATE)  USING LEFT INTERNAL MAMMARY ARTERY AND RIGHT GREATER SAPHENOUS VEIN HARVESTED ENDOSCOPICALLY.;  Surgeon: [name], MD;  Location: [place];  Service: Open Heart Surgery;  Laterality: N/A;  . KNEE SURGERY  left- removal fatty tumor  . ROTATOR CUFF REPAIR     Bilateral  . TEE WITHOUT CARDIOVERSION N/A 08/17/2018   Procedure: TRANSESOPHAGEAL ECHOCARDIOGRAM (TEE);  Surgeon: [name], MD;  Location: [place];  Service: Open Heart Surgery;  Laterality: N/A;  . TOTAL KNEE ARTHROPLASTY  10/10/2012   Procedure: TOTAL KNEE ARTHROPLASTY;  Surgeon: Gearlean Alf, MD;  [facility];  Service: Orthopedics;  Laterality: Right;    OB History    Gravida  2   Para  2   Term       Preterm      AB      Living        SAB      TAB      Ectopic      Multiple      Live Births               Home Medications    Prior to Admission medications   Medication Sig Start Date End Date Taking? Authorizing Provider  acetaminophen (TYLENOL) 500 MG tablet Take 500 mg by mouth every 6 (six) hours as needed.   Yes [provider]  aspirin EC 81 MG tablet Take 1 tablet (81 mg total) by mouth daily. 10/07/19   Dickie La, MD  atorvastatin (LIPITOR) 40 MG tablet TAKE 1 TABLET (40 MG TOTAL) BY MOUTH DAILY. NEED OFFICE VISIT FOR FUTURE REFILL. 01/16/20   Pixie Casino, MD  candesartan (ATACAND) 16 MG tablet Take 1 tablet (16 mg total) by mouth daily. 02/06/20   Minus Breeding, MD  chlorthalidone (HYGROTON) 25 MG tablet Take 1 tablet (25 mg total) by mouth daily. 03/11/20   Minus Breeding, MD  ezetimibe (ZETIA) 10 MG tablet Take 1 tablet (10 mg total) by mouth daily. 12/12/19 03/11/20  Hilty, Nadean Corwin, MD  FLUoxetine (PROZAC) 10 MG capsule Take one by mouth daily in addition to the one tab of 20 mg fluoxetine for total 30 mg a day 12/06/19   Dickie La, MD  FLUoxetine (PROZAC) 20 MG tablet Take 1 tablet (20 mg total) by mouth daily. 12/06/19   Dickie La, MD  furosemide (LASIX) 20 MG tablet Take 1 tablet (20 mg total) by mouth daily. MAY TAKE AN ADDITIONAL TABLET AS NEEDED. 02/06/20   Minus Breeding, MD  KLOR-CON M20 20 MEQ tablet TAKE 1 TABLET BY MOUTH EVERY DAY 10/25/19   Dickie La, MD  metFORMIN (GLUCOPHAGE) 1000 MG tablet Take 1 tablet (1,000 mg total) by mouth 2 (two) times daily with a meal. 09/27/19   Dickie La, MD  metoprolol succinate (TOPROL-XL) 50 MG 24 hr tablet Take 1 tablet (50 mg total) by mouth 2 (two) times daily. Take with or immediately following a meal. 07/09/19   Dickie La, MD  predniSONE (DELTASONE) 50 MG tablet Take 1 tablet (50 mg total) by mouth daily with breakfast. 03/12/20   Tasia Catchings, Janene Yousuf V, PA-C  tiZANidine (ZANAFLEX) 2 MG tablet Take 1  tablet (2 mg total) by mouth every 8 (eight) hours as needed for muscle spasms. 03/12/20   Tasia Catchings, Eutimio Gharibian V, PA-C  Vitamin D, Ergocalciferol, (DRISDOL) 1.25 MG (50000 UNIT) CAPS capsule Take 1 capsule (50,000 Units total) by mouth every 7 (seven) days. 02/13/20   Whitmire, Joneen Boers, FNP    Family History Family History  Problem Relation Age of Onset  . COPD Mother        Deceased  . High blood pressure Mother   .  High Cholesterol Mother   . Depression Mother   . Lung cancer Father        Deceaesd  . High Cholesterol Father   . Diabetes Other   . Melanoma Brother        Deceased  . COPD Brother   . Healthy Daughter     Social History Social History   Tobacco Use  . Smoking status: Former Smoker    Quit date: 09/12/1990    Years since quitting: 29.5  . Smokeless tobacco: Never Used  Vaping Use  . Vaping Use: Never used  Substance Use Topics  . Alcohol use: Yes    Alcohol/week: 0.0 standard drinks    Comment: rare  . Drug use: No     Allergies   Morphine and related, Statins, and Oxycodone   Review of Systems Review of Systems  Reason unable to perform ROS: See HPI as above.     Physical Exam Triage Vital Signs ED Triage Vitals  Enc Vitals Group     BP 03/12/20 1918 (!) 142/82     Pulse Rate 03/12/20 1918 80     Resp 03/12/20 1918 (!) 24     Temp 03/12/20 1918 98.1 F (36.7 C)     Temp Source 03/12/20 1918 Oral     SpO2 03/12/20 1918 96 %     Weight --      Height --      Head Circumference --      Peak Flow --      Pain Score 03/12/20 1917 10     Pain Loc --      Pain Edu? --      Excl. in [place]? --    No data found.  Updated Vital Signs BP (!) 142/82 (BP Location: Left Arm)   Pulse 80   Temp 98.1 F (36.7 C) (Oral)   Resp (!) 24   LMP 10/10/2010   SpO2 96%   Physical Exam Constitutional:      General: She is not in acute distress.    Appearance: Normal appearance. She is well-developed. She is not toxic-appearing or diaphoretic.  HENT:     Head:  Normocephalic and atraumatic.  Eyes:     Conjunctiva/sclera: Conjunctivae normal.     Pupils: Pupils are equal, round, and reactive to light.  Cardiovascular:     Rate and Rhythm: Normal rate and regular rhythm.  Pulmonary:     Effort: Pulmonary effort is normal. No respiratory distress.     Comments: LCTAB Musculoskeletal:     Cervical back: Normal range of motion and neck supple.     Comments: No rashes seen. No tenderness to palpation of spinous processes. No tenderness to palpation of back/hips/legs. Points to low lumbar/sacral region for pain. Patient in wheelchair, hard to ambulate due to pain. Can stand with good hip movement. Did not attempt straight leg raise. Sensation intact.   Skin:    General: Skin is warm and dry.  Neurological:     Mental Status: She is alert and oriented to person, place, and time.      UC Treatments / Results  Labs (all labs ordered are listed, but only abnormal results are displayed) Labs Reviewed - No data to display  EKG   Radiology No results found.  Procedures Procedures (including critical care time)  Medications Ordered in UC Medications  ketorolac (TORADOL) 15 MG/ML injection 15 mg (15 mg Intramuscular Given 03/12/20 1948)    Initial Impression / Assessment and  Plan / UC Course  I have reviewed the triage vital signs and the nursing notes.  Pertinent labs & imaging results that were available during my care of the patient were reviewed by me and considered in my medical decision making (see chart for details).    Patient with history of DM, last a1c 6.5, will provide prednisone for symptoms. Tizanidine as needed. Return precautions given.  Patient last creatinine slightly elevated from baseline at 1.4. no history of CKD. Offered toradol injection given significant pain. Risks and benefits discussed. Patient expresses understanding and would like to proceed.   Final Clinical Impressions(s) / UC Diagnoses   Final diagnoses:    Acute right-sided low back pain with right-sided sciatica    ED Prescriptions    Medication Sig Dispense Auth. Provider   predniSONE (DELTASONE) 50 MG tablet Take 1 tablet (50 mg total) by mouth daily with breakfast. 5 tablet Puanani Gene V, PA-C   tiZANidine (ZANAFLEX) 2 MG tablet Take 1 tablet (2 mg total) by mouth every 8 (eight) hours as needed for muscle spasms. 15 tablet Ok Edwards, PA-C     PDMP not reviewed this encounter.   Ok Edwards, PA-C 03/12/20 2147

## 2020-03-12 NOTE — ED Triage Notes (Addendum)
Lower back pain on right, pain radiating into right leg.  Patient has had back pain for a week.  Patient is remodeling a house.  Back has been sore.  This morning, could not get out of bed this morning.    Patient did fall this past Monday, patient fell tying shoe in garage, hit head on door.

## 2020-03-15 ENCOUNTER — Emergency Department (HOSPITAL_COMMUNITY): Payer: BC Managed Care – PPO

## 2020-03-15 ENCOUNTER — Encounter (HOSPITAL_COMMUNITY): Payer: Self-pay | Admitting: *Deleted

## 2020-03-15 ENCOUNTER — Emergency Department (HOSPITAL_COMMUNITY)
Admission: EM | Admit: 2020-03-15 | Discharge: 2020-03-15 | Disposition: A | Discharge status: Home / Self Care | Payer: BC Managed Care – PPO | Attending: Emergency Medicine | Admitting: Emergency Medicine

## 2020-03-15 ENCOUNTER — Other Ambulatory Visit: Payer: Self-pay

## 2020-03-15 DIAGNOSIS — M5431 Sciatica, right side: Secondary | ICD-10-CM

## 2020-03-15 DIAGNOSIS — I252 Old myocardial infarction: Secondary | ICD-10-CM | POA: Insufficient documentation

## 2020-03-15 DIAGNOSIS — Z87891 Personal history of nicotine dependence: Secondary | ICD-10-CM | POA: Insufficient documentation

## 2020-03-15 DIAGNOSIS — E119 Type 2 diabetes mellitus without complications: Secondary | ICD-10-CM | POA: Diagnosis not present

## 2020-03-15 DIAGNOSIS — I2510 Atherosclerotic heart disease of native coronary artery without angina pectoris: Secondary | ICD-10-CM | POA: Insufficient documentation

## 2020-03-15 DIAGNOSIS — I10 Essential (primary) hypertension: Secondary | ICD-10-CM | POA: Insufficient documentation

## 2020-03-15 DIAGNOSIS — Z7984 Long term (current) use of oral hypoglycemic drugs: Secondary | ICD-10-CM | POA: Diagnosis not present

## 2020-03-15 DIAGNOSIS — M5441 Lumbago with sciatica, right side: Secondary | ICD-10-CM | POA: Diagnosis not present

## 2020-03-15 DIAGNOSIS — M79604 Pain in right leg: Secondary | ICD-10-CM | POA: Diagnosis present

## 2020-03-15 DIAGNOSIS — Z7982 Long term (current) use of aspirin: Secondary | ICD-10-CM | POA: Insufficient documentation

## 2020-03-15 DIAGNOSIS — Z951 Presence of aortocoronary bypass graft: Secondary | ICD-10-CM | POA: Diagnosis not present

## 2020-03-15 DIAGNOSIS — M25551 Pain in right hip: Secondary | ICD-10-CM | POA: Insufficient documentation

## 2020-03-15 DIAGNOSIS — Z79899 Other long term (current) drug therapy: Secondary | ICD-10-CM | POA: Diagnosis not present

## 2020-03-15 MED ORDER — TRAMADOL HCL 50 MG PO TABS: 50.0000 mg | ORAL_TABLET | Freq: Four times a day (QID) | ORAL | 0 refills | Status: DC | PRN

## 2020-03-15 MED ORDER — TRAMADOL HCL 50 MG PO TABS
50.0000 mg | ORAL_TABLET | Freq: Once | ORAL | Status: AC
  Administered 2020-03-15: 50 mg via ORAL
  Filled 2020-03-15: qty 1

## 2020-03-15 NOTE — ED Notes (Signed)
ED Provider at bedside. 

## 2020-03-15 NOTE — ED Notes (Signed)
Patient transported to X-ray 

## 2020-03-15 NOTE — ED Provider Notes (Signed)
[facility] DEPT Provider Note   CSN: [id number] Arrival date & time: 03/15/20  1558     History Chief Complaint  Patient presents with  . Leg Pain    Pamela Thomas is a 64 y.o. female.  Patient is a 64 year old female who presents with right posterior leg pain.  She states that 5 days ago she was bending over and lost her balance, she fell forward hitting her head.  She said she did not fall on her leg or hit but she was in a awkward position.  The next day she started having pain in her right posterior hip area.  Is just at the base of her buttocks.  It radiates down her right leg.  She describes it as a burning type pain.  She denies any weakness in the leg.  She has some chronic neuropathy from her diabetes but denies any change in the numbness.  No loss of bowel or bladder control.  No other injuries from the fall.  She was seen in urgent care this week and was given a shot of Toradol.  She was started on prednisone and a muscle relaxer.  She says that she is not taking those but has not had any improvement in her symptoms.  She said the pain is worse when she is ambulating or turning over.        Past Medical History:  Diagnosis Date  . ALLERGIC RHINITIS, SEASONAL   . Anxiety   . Back pain   . BCC (basal cell carcinoma of skin) 2005   Nose [facility])  . Constipation   . Coronary artery disease   . Depression   . Diabetes mellitus without complication ([place])   . DYSLIPIDEMIA   . Dysrhythmia    palpitations-evaluated by Dr Johnsie Cancel 6/12/eccho 6/13  . Glucose intolerance (impaired glucose tolerance)   . History of blood transfusion   . Hypertension   . Joint pain   . OBESITY, NOS   . Osteoarthritis    rt knee  . PLANTAR FASCIITIS, BILATERAL   . Vertigo   . Vitamin D deficiency     Patient Active Problem List   Diagnosis Date Noted  . Hypotension 12/25/2019  . Vitamin D deficiency 12/25/2019  . Hypertriglyceridemia  10/17/2019  . Class 3 severe obesity due to excess calories with body mass index (BMI) of 45.0 to 49.9 in adult [facility]) 10/15/2019  . Dyslipidemia 07/25/2019  . Nonrheumatic aortic valve stenosis 07/25/2019  . Bilateral carotid artery stenosis 04/26/2019  . Dyspnea 03/20/2019  . Diastolic CHF ([place]) 81/15/7262  . Dysthymia 01/30/2019  . S/P CABG x 1 10/15/2018  . Carotid atherosclerosis, bilateral 10/15/2018  . Coronary artery disease 08/17/2018  . STEMI (ST elevation myocardial infarction) ([place]) 07/16/2018  . Hypertension 02/22/2017  . Visit for preventive health examination 07/29/2016  . Right knee DJD, s/p TKR 06/21/2012  . Family history of malignant melanoma 06/15/2011  . Type 2 diabetes mellitus with hyperglycemia, without long-term current use of insulin ([place]) 12/09/2009  . CARDIAC MURMUR 05/27/2009  . Hyperlipidemia associated with type 2 diabetes mellitus ([place]) 05/21/2007  . ALLERGIC RHINITIS, SEASONAL 01/08/2007  . Obesity 11/09/2006  . HYPERTENSION, BENIGN SYSTEMIC 11/09/2006    Past Surgical History:  Procedure Laterality Date  . BREAST BIOPSY  05/27/03   left  . CARDIAC CATHETERIZATION    . CARPAL TUNNEL RELEASE  05/27/03   Right  . CESAREAN SECTION  05/27/03  . CORONARY ARTERY BYPASS GRAFT N/A  08/17/2018   Procedure: CORONARY ARTERY BYPASS GRAFTING (CABG) TIMES TWO: LIMA to LAD, SVG to RAMUS INTERMEDIATE)  USING LEFT INTERNAL MAMMARY ARTERY AND RIGHT GREATER SAPHENOUS VEIN HARVESTED ENDOSCOPICALLY.;  Surgeon: [name], MD;  Location: [place];  Service: Open Heart Surgery;  Laterality: N/A;  . KNEE SURGERY     left- removal fatty tumor  . ROTATOR CUFF REPAIR     Bilateral  . TEE WITHOUT CARDIOVERSION N/A 08/17/2018   Procedure: TRANSESOPHAGEAL ECHOCARDIOGRAM (TEE);  Surgeon: [name], MD;  Location: [place];  Service: Open Heart Surgery;  Laterality: N/A;  . TOTAL KNEE ARTHROPLASTY  10/10/2012   Procedure: TOTAL KNEE ARTHROPLASTY;  Surgeon: Gearlean Alf, MD;  [facility];  Service: Orthopedics;  Laterality: Right;     OB History    Gravida  2   Para  2   Term      Preterm      AB      Living        SAB      TAB      Ectopic      Multiple      Live Births              Family History  Problem Relation Age of Onset  . COPD Mother        Deceased  . High blood pressure Mother   . High Cholesterol Mother   . Depression Mother   . Lung cancer Father        Deceaesd  . High Cholesterol Father   . Diabetes Other   . Melanoma Brother        Deceased  . COPD Brother   . Healthy Daughter     Social History   Tobacco Use  . Smoking status: Former Smoker    Quit date: 09/12/1990    Years since quitting: 29.5  . Smokeless tobacco: Never Used  Vaping Use  . Vaping Use: Never used  Substance Use Topics  . Alcohol use: Yes    Alcohol/week: 0.0 standard drinks    Comment: rare  . Drug use: No    Home Medications Prior to Admission medications   Medication Sig Start Date End Date Taking? Authorizing Provider  acetaminophen (TYLENOL) 500 MG tablet Take 500 mg by mouth every 6 (six) hours as needed for moderate pain.    Yes [provider]  aspirin EC 81 MG tablet Take 1 tablet (81 mg total) by mouth daily. 10/07/19  Yes Dickie La, MD  atorvastatin (LIPITOR) 40 MG tablet TAKE 1 TABLET (40 MG TOTAL) BY MOUTH DAILY. NEED OFFICE VISIT FOR FUTURE REFILL. 01/16/20  Yes Hilty, Nadean Corwin, MD  candesartan (ATACAND) 16 MG tablet Take 1 tablet (16 mg total) by mouth daily. 02/06/20  Yes Minus Breeding, MD  chlorthalidone (HYGROTON) 25 MG tablet Take 1 tablet (25 mg total) by mouth daily. 03/11/20  Yes Minus Breeding, MD  ezetimibe (ZETIA) 10 MG tablet Take 10 mg by mouth every evening.   Yes [provider]  FLUoxetine (PROZAC) 10 MG capsule Take one by mouth daily in addition to the one tab of 20 mg fluoxetine for total 30 mg a day 12/06/19  Yes Dickie La, MD  FLUoxetine (PROZAC) 20 MG  tablet Take 1 tablet (20 mg total) by mouth daily. 12/06/19  Yes Dickie La, MD  furosemide (LASIX) 20 MG tablet Take 1 tablet (20 mg total) by mouth daily. MAY  TAKE AN ADDITIONAL TABLET AS NEEDED. Patient taking differently: Take 20 mg by mouth daily as needed for fluid or edema. MAY TAKE AN ADDITIONAL TABLET AS NEEDED. 02/06/20  Yes Minus Breeding, MD  KLOR-CON M20 20 MEQ tablet TAKE 1 TABLET BY MOUTH EVERY DAY 10/25/19  Yes Dickie La, MD  metFORMIN (GLUCOPHAGE) 1000 MG tablet Take 1 tablet (1,000 mg total) by mouth 2 (two) times daily with a meal. 09/27/19  Yes Dickie La, MD  metoprolol succinate (TOPROL-XL) 50 MG 24 hr tablet Take 1 tablet (50 mg total) by mouth 2 (two) times daily. Take with or immediately following a meal. 07/09/19  Yes Dickie La, MD  predniSONE (DELTASONE) 50 MG tablet Take 1 tablet (50 mg total) by mouth daily with breakfast. 03/12/20  Yes Yu, Amy V, PA-C  tiZANidine (ZANAFLEX) 2 MG tablet Take 1 tablet (2 mg total) by mouth every 8 (eight) hours as needed for muscle spasms. 03/12/20  Yes Yu, Amy V, PA-C  Vitamin D, Ergocalciferol, (DRISDOL) 1.25 MG (50000 UNIT) CAPS capsule Take 1 capsule (50,000 Units total) by mouth every 7 (seven) days. 02/13/20  Yes Whitmire, Dawn W, FNP  candesartan (ATACAND) 32 MG tablet Take 32 mg by mouth daily. Patient not taking: Reported on 03/15/2020 02/15/20   [provider]  ezetimibe (ZETIA) 10 MG tablet Take 1 tablet (10 mg total) by mouth daily. 12/12/19 03/11/20  Hilty, Nadean Corwin, MD  traMADol (ULTRAM) 50 MG tablet Take 1 tablet (50 mg total) by mouth every 6 (six) hours as needed. 03/15/20   Malvin Johns, MD    Allergies    Morphine and related, Statins, and Oxycodone  Review of Systems   Review of Systems  Constitutional: Negative for fever.  Gastrointestinal: Negative for nausea and vomiting.  Musculoskeletal: Positive for arthralgias. Negative for back pain, joint swelling and neck pain.  Skin: Negative for wound.    Neurological: Negative for weakness, numbness and headaches.    Physical Exam Updated Vital Signs BP (!) 177/82 (BP Location: Left Wrist)   Pulse 66   Temp (!) 97.4 F (36.3 C) (Oral)   Resp 20   Ht 5\' 2"  (1.575 m)   Wt 103.4 kg   LMP 10/10/2010   SpO2 97%   BMI 41.70 kg/m   Physical Exam Constitutional:      Appearance: She is well-developed.  HENT:     Head: Normocephalic and atraumatic.  Cardiovascular:     Rate and Rhythm: Normal rate.  Pulmonary:     Effort: Pulmonary effort is normal.  Musculoskeletal:        General: Tenderness present.     Cervical back: Normal range of motion and neck supple.     Comments: Positive tenderness to the right buttocks area.  Negative straight leg raise.  She has normal sensation and motor function distally other than her chronic neuropathy of her foot.  Pedal pulses are intact.  No rashes or lesions are known overlying the injury.  No discoloration or bruising.  No pain along the spine or lumbar region.  Skin:    General: Skin is warm and dry.  Neurological:     Mental Status: She is alert and oriented to person, place, and time.     ED Results / Procedures / Treatments   Labs (all labs ordered are listed, but only abnormal results are displayed) Labs Reviewed - No data to display  EKG None  Radiology DG Hip Unilat W or Wo Pelvis 2-3  Views Right  Result Date: 03/15/2020 CLINICAL DATA:  Status post fall. EXAM: DG HIP (WITH OR WITHOUT PELVIS) 2-3V RIGHT COMPARISON:  None. FINDINGS: There is no evidence of hip fracture or dislocation. There is no evidence of arthropathy or other focal bone abnormality. A single radiopaque surgical clip is seen within the soft tissues inferior to the right inferior pubic ramus. IMPRESSION: No acute osseous abnormality. Electronically Signed   By: Virgina Norfolk M.D.   On: 03/15/2020 21:28    Procedures Procedures (including critical care time)  Medications Ordered in ED Medications   traMADol (ULTRAM) tablet 50 mg (50 mg Oral Given 03/15/20 2057)    ED Course  I have reviewed the triage vital signs and the nursing notes.  Pertinent labs & imaging results that were available during my care of the patient were reviewed by me and considered in my medical decision making (see chart for details).    MDM Rules/Calculators/A&P                          Patient is a 64 year old female who presents with pain in her right posterior buttocks.  X-rays were performed which showed no evidence of bony injury.  She is neurologically intact.  Seems to be consistent with sciatica.  She was given dose of tramadol in the ED and her pain has improved.  She has no spinal tenderness.  She was discharged home in good condition.  She was given a prescription for tramadol and will continue using the prednisone until she finishes the course that was started at the urgent care.  She will follow-up with her orthopedist at [facility].  Return precautions were given. Final Clinical Impression(s) / ED Diagnoses Final diagnoses:  Sciatica of right side    Rx / DC Orders ED Discharge Orders         Ordered    traMADol (ULTRAM) 50 MG tablet  Every 6 hours PRN     Discontinue  Reprint     03/15/20 2222           Malvin Johns, MD 03/15/20 2225

## 2020-03-15 NOTE — ED Triage Notes (Signed)
Pt seen 7/1 at UC, pain continues in rt hip and leg.

## 2020-05-22 ENCOUNTER — Other Ambulatory Visit: Payer: Self-pay | Admitting: Internal Medicine

## 2020-06-23 ENCOUNTER — Other Ambulatory Visit: Payer: Self-pay | Admitting: Internal Medicine

## 2020-07-14 ENCOUNTER — Other Ambulatory Visit: Payer: Self-pay | Admitting: Family Medicine

## 2020-07-22 ENCOUNTER — Other Ambulatory Visit: Payer: Self-pay

## 2020-07-22 ENCOUNTER — Ambulatory Visit: Payer: BC Managed Care – PPO | Admitting: Family Medicine

## 2020-07-22 ENCOUNTER — Ambulatory Visit: Payer: BC Managed Care – PPO

## 2020-07-22 ENCOUNTER — Encounter: Payer: Self-pay | Admitting: Family Medicine

## 2020-07-22 VITALS — BP 118/62 | HR 65 | Ht 62.0 in | Wt 237.4 lb

## 2020-07-22 DIAGNOSIS — Z23 Encounter for immunization: Secondary | ICD-10-CM | POA: Diagnosis not present

## 2020-07-22 DIAGNOSIS — R430 Anosmia: Secondary | ICD-10-CM | POA: Diagnosis not present

## 2020-07-22 DIAGNOSIS — E538 Deficiency of other specified B group vitamins: Secondary | ICD-10-CM | POA: Diagnosis not present

## 2020-07-22 MED ORDER — CYANOCOBALAMIN 1000 MCG/ML IJ SOLN
1000.0000 ug | Freq: Once | INTRAMUSCULAR | Status: AC
  Administered 2020-07-22: 1000 ug via INTRAMUSCULAR

## 2020-07-22 MED ORDER — NYSTATIN 100000 UNIT/ML MT SUSP: 500000.0000 [IU] | Freq: Four times a day (QID) | OROMUCOSAL | 1 refills | Status: AC

## 2020-07-22 NOTE — Patient Instructions (Signed)
At the pharmacy, buy some vitamin B12.  The pharmacist can help you find some.  Take 1 a day.  I would do this chronically.  I am giving you B12 shot today and starting you on some nystatin mouthwash.  If you are having some improvement with the use of the mouthwash for a couple of weeks in the B12 shot plus the oral B12, please let me know.  Regarding the right back pain, if that does not resolve in the next week or 2 or if it gets worse, let me know.  I would certainly consider doing x-rays of your back as I think that is most likely the cause.  We could also consider CT of the kidneys to look for stones as that can sometimes cause pain back there.  Do not put this off so if it is not gone in 2 to 3 weeks, let me know.  Regarding her sense of smell, I do not know whether it is coming come back or not.  I suspect it would never come back fully.  That will continue to affect rotation as we discussed.  Great to see you!  Let me see you for regular follow-up in about 3 months.

## 2020-07-23 DIAGNOSIS — R430 Anosmia: Secondary | ICD-10-CM | POA: Insufficient documentation

## 2020-07-23 DIAGNOSIS — E538 Deficiency of other specified B group vitamins: Secondary | ICD-10-CM | POA: Insufficient documentation

## 2020-07-23 NOTE — Assessment & Plan Note (Signed)
I reminded her that she has had seasonal issues with decreased sense of smell.  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  She seems somewhat relieved.  Is unclear whether or not she will get her full sense of smell back.  I do not think there is anything specific to do.  I do think that is contributing to her lack of sense of taste and we discussed.

## 2020-07-23 NOTE — Assessment & Plan Note (Signed)
We will give her B12 shot today and start oral B12.  I think this is contributing to her tongue issues.  I would expect oral B12 to be longstanding.  We will check B12 when I see her back.

## 2020-07-23 NOTE — Progress Notes (Signed)
    CHIEF COMPLAINT / HPI: #1.  Recent decrease in her ability to smell.  She has had problems off and on over the years seasonally with allergy season causing issues, and she had total loss of smell when she had Covid viral infection months ago.  Her sense of smell had returned about 60% but in the last 2 to 3 weeks it is decreased again.  She is worried about what this means. 2.  Tongue feels tender and dry most of the time.  She notes that she is also lost her taste which correlates with loss of smell. 3.  Right mid back pain for the last 2 weeks.  No specific injury.  Aching.  2-3 out of 10 at its worst.  Intermittent.  Nothing seems to make it worse or better particularly.   PERTINENT  PMH / PSH: I have reviewed the patient's medications, allergies, past medical and surgical history, smoking status and updated in the EMR as appropriate.   OBJECTIVE:  BP 118/62   Pulse 65   Ht 5\' 2"  (1.575 m)   Wt 237 lb 6.4 oz (107.7 kg)   LMP 10/10/2010   SpO2 95%   BMI 43.42 kg/m   GENERAL: Well-developed female no acute distress CV: Regular rate and rhythm without murmur LUNGS: Clear to auscultation bilaterally with good air movement in all lung fields ABDOMEN: Soft, positive bowel sounds HEENT: Oropharynx reveals a dry tongue with some white particulate consistent with patchy yeast.  Mucous membranes are somewhat dry. PSYCH: AxOx4. Good eye contact.. No psychomotor retardation or agitation. Appropriate speech fluency and content. Asks and answers questions appropriately. Mood is congruent.  ASSESSMENT / PLAN: Right mid back pain: She really does not have any tenderness to palpation percussion or movement.  It is right over the right kidney area but she has had no urinary symptoms and no blood in her urine.  We discussed.  It could likely be related to MSK issues and if it does not resolve on its own in the next 2 weeks she will let me know.  Would consider CT stone study of the kidney. Tongue  pain: I think this is combination of low B12 and possibly some oropharyngeal yeast.  We will put her on nystatin swish and swallow.  If it does not resolve, she will let me know. Follow-up: She has a lot of issues not like to see her on a more regular basis I would have her come back at least in the next 2 to 3 months.  Anosmia I reminded her that she has had seasonal issues with decreased sense of smell.  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  She seems somewhat relieved.  Is unclear whether or not she will get her full sense of smell back.  I do not think there is anything specific to do.  I do think that is contributing to her lack of sense of taste and we discussed.  Vitamin B 12 deficiency We will give her B12 shot today and start oral B12.  I think this is contributing to her tongue issues.  I would expect oral B12 to be longstanding.  We will check B12 when I see her back.   [name] MD

## 2020-08-17 ENCOUNTER — Other Ambulatory Visit: Payer: Self-pay | Admitting: Family Medicine

## 2020-08-18 ENCOUNTER — Other Ambulatory Visit: Payer: Self-pay | Admitting: Family Medicine

## 2020-09-30 ENCOUNTER — Other Ambulatory Visit: Payer: Self-pay

## 2020-09-30 ENCOUNTER — Ambulatory Visit (HOSPITAL_COMMUNITY)
Admission: RE | Admit: 2020-09-30 | Discharge: 2020-09-30 | Disposition: A | Discharge status: Home / Self Care | Payer: BC Managed Care – PPO | Source: Ambulatory Visit | Attending: Internal Medicine | Admitting: Internal Medicine

## 2020-09-30 ENCOUNTER — Other Ambulatory Visit: Payer: Self-pay | Admitting: Cardiology

## 2020-09-30 DIAGNOSIS — I6523 Occlusion and stenosis of bilateral carotid arteries: Secondary | ICD-10-CM | POA: Insufficient documentation

## 2020-10-14 ENCOUNTER — Other Ambulatory Visit: Payer: Self-pay | Admitting: Internal Medicine

## 2020-10-14 DIAGNOSIS — E782 Mixed hyperlipidemia: Secondary | ICD-10-CM

## 2020-11-02 ENCOUNTER — Other Ambulatory Visit: Payer: Self-pay | Admitting: Family Medicine

## 2020-11-02 DIAGNOSIS — Z1231 Encounter for screening mammogram for malignant neoplasm of breast: Secondary | ICD-10-CM

## 2020-11-11 ENCOUNTER — Other Ambulatory Visit: Payer: Self-pay | Admitting: Internal Medicine

## 2020-11-11 ENCOUNTER — Other Ambulatory Visit: Payer: Self-pay | Admitting: *Deleted

## 2020-11-11 DIAGNOSIS — E785 Hyperlipidemia, unspecified: Secondary | ICD-10-CM

## 2020-11-11 DIAGNOSIS — Z1231 Encounter for screening mammogram for malignant neoplasm of breast: Secondary | ICD-10-CM

## 2020-11-13 ENCOUNTER — Other Ambulatory Visit: Payer: Self-pay | Admitting: Internal Medicine

## 2020-11-14 ENCOUNTER — Other Ambulatory Visit: Payer: Self-pay | Admitting: Family Medicine

## 2020-11-16 LAB — LIPID PANEL
Chol/HDL Ratio: 5.2 ratio — ABNORMAL HIGH (ref 0.0–4.4)
Cholesterol, Total: 173 mg/dL (ref 100–199)
HDL: 33 mg/dL — ABNORMAL LOW (ref >39)
LDL Chol Calc (NIH): 79 mg/dL (ref 0–99)
Triglycerides: 382 mg/dL — ABNORMAL HIGH (ref 0–149)
VLDL Cholesterol Cal: 61 mg/dL — ABNORMAL HIGH (ref 5–40)

## 2020-11-18 ENCOUNTER — Encounter: Payer: Self-pay | Admitting: Internal Medicine

## 2020-11-18 ENCOUNTER — Ambulatory Visit: Payer: BC Managed Care – PPO | Admitting: Internal Medicine

## 2020-11-18 ENCOUNTER — Other Ambulatory Visit: Payer: Self-pay

## 2020-11-18 VITALS — BP 124/64 | HR 67 | Ht 62.0 in | Wt 233.0 lb

## 2020-11-18 DIAGNOSIS — E785 Hyperlipidemia, unspecified: Secondary | ICD-10-CM | POA: Diagnosis not present

## 2020-11-18 DIAGNOSIS — Z951 Presence of aortocoronary bypass graft: Secondary | ICD-10-CM | POA: Diagnosis not present

## 2020-11-18 DIAGNOSIS — I2510 Atherosclerotic heart disease of native coronary artery without angina pectoris: Secondary | ICD-10-CM

## 2020-11-18 NOTE — Patient Instructions (Signed)
Medication Instructions:  Your physician recommends that you continue on your current medications as directed. Please refer to the Current Medication list given to you today.  *If you need a refill on your cardiac medications before your next appointment, please call your pharmacy*   Follow-Up: At [facility], you and your health needs are our priority.  As part of our continuing mission to provide you with exceptional heart care, we have created designated Provider Care Teams.  These Care Teams include your primary Cardiologist (physician) and Advanced Practice Providers (APPs -  Physician Assistants and Nurse Practitioners) who all work together to provide you with the care you need, when you need it.  We recommend signing up for the patient portal called "MyChart".  Sign up information is provided on this After Visit Summary.  MyChart is used to connect with patients for Virtual Visits (Telemedicine).  Patients are able to view lab/test results, encounter notes, upcoming appointments, etc.  Non-urgent messages can be sent to your provider as well.   To learn more about what you can do with MyChart, go to [email or web address].    Your next appointment:   AS NEEDED with Dr. Debara Pickett  Continue routine cardiology care with Dr. Percival Spanish

## 2020-11-18 NOTE — Progress Notes (Signed)
LIPID CLINIC CONSULT NOTE  Chief Complaint:  Dyslipidemia  Primary Care Physician: Pamela Thomas  HPI:  Pamela Thomas is a 65 y.o. female who is being seen today for the evaluation of lipidemia at the request of Pamela Thomas.  This is a pleasant 65 year old female who unfortunately suffered a recent acute anterior MI.  She was babysitting her granddaughter in the [place] area and presented with acute chest pain.  She was found by cardiac catheterization to have an occluded right coronary artery as well as severe LAD disease.  Ultimately she was considered for coronary artery bypass grafting however wish to have further evaluation here in [place].  She is a patient of Pamela. Percival Thomas who has referred her to see Pamela. Servando Thomas next week for surgical evaluation.  In addition to this she has a long-standing history of elevated triglycerides and cholesterol.  Last year her triglycerides were as high as 850.  Recently she had lab work at [facility] which showed total cholesterol of 188, triglycerides 403 and HDL 33.  LDL was not calculated.  A direct LDL from earlier this year was 123.  She has not been on statin therapy.  She was appropriately started on high intensity atorvastatin 80 mg for her acute MI which she is tolerating.  She is also on dual antiplatelet therapy and a beta-blocker.  She does report a fairly atherogenic diet and is morbidly obese.  She says she has been trying to work on dietary changes and weight loss to help with her numbers.  She is also type II diabetic on metformin.  06/11/2019  Seen today in follow-up.  She is done well and had reached goal cholesterol on high potency atorvastatin and Vascepa.  Unfortunately she was having significant side effects, presumably from 80 mg of atorvastatin.  She felt overall generalized weakness and muscle soreness.  I decreased the dose of her atorvastatin from 80 to 40 mg and she said she felt an almost immediate improvement in  that.  She said is much better tolerated now.  Despite this changes there is only been a modest increase in her cholesterol.  Most recently total cholesterol is 158, triglycerides 281 (increased from 212), HDL 34 and LDL 78.  Her goal LDL is less than 70, however now that she is not in as much discomfort, I suspect that we can continue to work on weight loss and increased physical activity.  She notes that she is restarted in cardiac rehab.  11/18/2020  Pamela Thomas returns today for follow-up.  I last saw her via virtual visit.  She was having some issues with diarrhea and low blood pressure.  I advised her to discontinue amlodipine as she was symptomatic and also she was taken off of Vascepa.  She says a lot of this actually is improved.  She feels much better.  Blood pressure today was 124/64.  She had recent repeat lipids which showed total cholesterol 173, triglycerides 382, HDL 33 and LDL 79.  Overall reasonably improved given that her triglycerides have been as high as 600.  Unfortunately she cannot take the fish oils.  PMHx:  Past Medical History:  Diagnosis Date  . ALLERGIC RHINITIS, SEASONAL   . Anxiety   . Back pain   . BCC (basal cell carcinoma of skin) 2005   [facility])  . Constipation   . Coronary artery disease   . Depression   . Diabetes mellitus without complication ([place])   . DYSLIPIDEMIA   .  Dysrhythmia    palpitations-evaluated by Pamela Thomas 6/12/eccho 6/13  . Glucose intolerance (impaired glucose tolerance)   . History of blood transfusion   . Hypertension   . Joint pain   . OBESITY, NOS   . Osteoarthritis    rt knee  . PLANTAR FASCIITIS, BILATERAL   . Vertigo   . Vitamin D deficiency     Past Surgical History:  Procedure Laterality Date  . BREAST BIOPSY  05/27/03   left  . CARDIAC CATHETERIZATION    . CARPAL TUNNEL RELEASE  05/27/03   Right  . CESAREAN SECTION  05/27/03  . CORONARY ARTERY BYPASS GRAFT N/A 08/17/2018   Procedure: CORONARY ARTERY BYPASS  GRAFTING (CABG) TIMES TWO: LIMA to LAD, SVG to RAMUS INTERMEDIATE)  USING LEFT INTERNAL MAMMARY ARTERY AND RIGHT GREATER SAPHENOUS VEIN HARVESTED ENDOSCOPICALLY.;  Surgeon: Pamela Isaac, Thomas;  Location: [facility];  Service: Open Heart Surgery;  Laterality: N/A;  . KNEE SURGERY     left- removal fatty tumor  . ROTATOR CUFF REPAIR     Bilateral  . TEE WITHOUT CARDIOVERSION N/A 08/17/2018   Procedure: TRANSESOPHAGEAL ECHOCARDIOGRAM (TEE);  Surgeon: Pamela Isaac, Thomas;  Location: [place];  Service: Open Heart Surgery;  Laterality: N/A;  . TOTAL KNEE ARTHROPLASTY  10/10/2012   Procedure: TOTAL KNEE ARTHROPLASTY;  Surgeon: Pamela Alf, Thomas;  Location: [facility];  Service: Orthopedics;  Laterality: Right;    FAMHx:  Family History  Problem Relation Age of Onset  . COPD Mother        Deceased  . High blood pressure Mother   . High Cholesterol Mother   . Depression Mother   . Lung cancer Father        Deceaesd  . High Cholesterol Father   . Diabetes Other   . Melanoma Brother        Deceased  . COPD Brother   . Healthy Daughter     SOCHx:   reports that she quit smoking about 30 years ago. She has never used smokeless tobacco. She reports current alcohol use. She reports that she does not use drugs.  ALLERGIES:  Allergies  Allergen Reactions  . Morphine And Related     Severe hallucinations, difficult arousal.   . Statins     Myalgia pain moderate, flu like symptoms.   . Oxycodone Nausea Only    Has to take nausea medications if taken. Prefer not to take    ROS: Pertinent items noted in HPI and remainder of comprehensive ROS otherwise negative.  HOME MEDS: Current Outpatient Medications on File Prior to Visit  Medication Sig Dispense Refill  . acetaminophen (TYLENOL) 500 MG tablet Take 500 mg by mouth every 6 (six) hours as needed for moderate pain.     Marland Kitchen aspirin 81 MG EC tablet TAKE 1 TABLET BY MOUTH EVERY DAY 100 tablet 1  . atorvastatin (LIPITOR) 40 MG tablet TAKE 1  TABLET (40 MG TOTAL) BY MOUTH DAILY. NEED OFFICE VISIT FOR FUTURE REFILL. 30 tablet 17  . candesartan (ATACAND) 16 MG tablet Take 1 tablet (16 mg total) by mouth daily. 90 tablet 3  . chlorthalidone (HYGROTON) 25 MG tablet Take 1 tablet (25 mg total) by mouth daily. 30 tablet 11  . ezetimibe (ZETIA) 10 MG tablet TAKE 1 TABLET BY MOUTH EVERY DAY 30 tablet 3  . FLUoxetine (PROZAC) 10 MG capsule Take one by mouth daily in addition to the one tab of 20 mg fluoxetine for total 30 mg  a day 90 capsule 3  . FLUoxetine (PROZAC) 20 MG tablet Take 1 tablet (20 mg total) by mouth daily. 90 tablet 3  . furosemide (LASIX) 20 MG tablet Take 1 tablet (20 mg total) by mouth daily. MAY TAKE AN ADDITIONAL TABLET AS NEEDED. (Patient taking differently: Take 20 mg by mouth daily as needed for fluid or edema. MAY TAKE AN ADDITIONAL TABLET AS NEEDED.) 60 tablet 11  . KLOR-CON M20 20 MEQ tablet TAKE 1 TABLET BY MOUTH EVERY DAY 90 tablet 3  . metFORMIN (GLUCOPHAGE) 1000 MG tablet TAKE 1 TABLET (1,000 MG TOTAL) BY MOUTH 2 (TWO) TIMES DAILY WITH A MEAL. 60 tablet 20  . metoprolol succinate (TOPROL-XL) 50 MG 24 hr tablet TAKE 1 TABLET (50 MG TOTAL) BY MOUTH 2 (TWO) TIMES DAILY. TAKE WITH OR IMMEDIATELY FOLLOWING A MEAL. 180 tablet 2   No current facility-administered medications on file prior to visit.    LABS/IMAGING: No results found for this or any previous visit (from the past 48 hour(s)). No results found.  LIPID PANEL:    Component Value Date/Time   CHOL 173 11/16/2020 0942   TRIG 382 (H) 11/16/2020 0942   HDL 33 (L) 11/16/2020 0942   CHOLHDL 5.2 (H) 11/16/2020 0942   CHOLHDL 5.5 05/11/2011 0932   VLDL NOT CALC 05/11/2011 0932   LDLCALC 79 11/16/2020 0942   LDLDIRECT 63 01/14/2019 1005   LDLDIRECT 76 07/27/2016 1048    WEIGHTS: Wt Readings from Last 3 Encounters:  11/18/20 233 lb (105.7 kg)  07/22/20 237 lb 6.4 oz (107.7 kg)  03/15/20 228 lb (103.4 kg)    VITALS: BP 124/64 (BP Location: Left Arm,  Patient Position: Sitting)   Pulse 67   Ht 5\' 2"  (1.575 m)   Wt 233 lb (105.7 kg)   LMP 10/10/2010   SpO2 97%   BMI 42.62 kg/m   EXAM: Deferred  EKG: Deferred  ASSESSMENT: 1. Mixed dyslipidemia (elevated LDL and high triglycerides, low HDL) 2. Morbid obesity 3. Type 2 diabetes 4. Recent acute inferior MI  PLAN: 1.   Mrs. Salsbury is near target LDL less than 70 on her current regimen.  Unfortunately she cannot tolerate fish oils due to GI side effects.  Overall I think she is doing pretty well.  I would continue her current treatments, increase physical activity and continue to make dietary changes as necessary to keep her cholesterol low.  I do not think she will need any advanced therapies and therefore think she could continue to follow with Pamela. Percival Thomas who is her primary cardiologist.  I am certainly happy to see her back as needed if further therapies would ultimately be necessary to keep her cholesterol at target.  Pixie Casino, Thomas, [facility], [place] Director of the Advanced Lipid Disorders &  Cardiovascular Risk Reduction Clinic Diplomate of the American Board of Clinical Lipidology Attending Cardiologist  Direct Dial: [phone]  Fax: [phone]  Website:  [email or web address]  Nadean Corwin Carlene Bickley 11/18/2020, 1:32 PM

## 2020-12-07 ENCOUNTER — Other Ambulatory Visit: Payer: Self-pay | Admitting: Family Medicine

## 2020-12-14 ENCOUNTER — Other Ambulatory Visit: Payer: Self-pay | Admitting: Family Medicine

## 2020-12-14 LAB — COLOGUARD

## 2020-12-15 ENCOUNTER — Encounter: Payer: Self-pay | Admitting: Family Medicine

## 2020-12-21 ENCOUNTER — Ambulatory Visit
Admission: RE | Admit: 2020-12-21 | Discharge: 2020-12-21 | Disposition: A | Discharge status: Home / Self Care | Payer: BC Managed Care – PPO | Source: Ambulatory Visit | Attending: Family Medicine | Admitting: Family Medicine

## 2020-12-21 ENCOUNTER — Other Ambulatory Visit: Payer: Self-pay

## 2021-01-06 LAB — COLOGUARD: Cologuard: POSITIVE — AB

## 2021-01-13 ENCOUNTER — Telehealth: Payer: Self-pay | Admitting: Family Medicine

## 2021-01-13 NOTE — Telephone Encounter (Signed)
She had already been notified of positive COLOGUARD by GI office. GI is setting up her colonoscopy.

## 2021-01-14 ENCOUNTER — Other Ambulatory Visit: Payer: Self-pay | Admitting: Cardiology

## 2021-01-18 ENCOUNTER — Telehealth: Payer: Self-pay

## 2021-01-18 NOTE — Telephone Encounter (Signed)
   [facility] Pre-operative Risk Assessment    Patient Name: Pamela Thomas  DOB: 1956/09/09  MRN: [id number]   HEARTCARE STAFF: - Please ensure there is not already an duplicate clearance open for this procedure. - Under Visit Info/Reason for Call, type in Other and utilize the format Clearance MM/DD/YY or Clearance TBD. Do not use dashes or single digits. - If request is for dental extraction, please clarify the # of teeth to be extracted.  Request for surgical clearance:  1. What type of surgery is being performed? Colonoscopy  2. When is this surgery scheduled? 02/02/21   3. What type of clearance is required (medical clearance vs. Pharmacy clearance to hold med vs. Both)? Both  4. Are there any medications that need to be held prior to surgery and how long? N/A  5. Practice name and name of physician performing surgery? Dr. Benson Norway at [facility]  6. What is the office phone number? [phone]   7.   What is the office fax number? [phone]  8.   Anesthesia type (None, local, MAC, general) ? Propofol   Melis Trochez B Taytem Ghattas 01/18/2021, 10:51 AM  _________________________________________________________________   (provider comments below)

## 2021-01-18 NOTE — Telephone Encounter (Signed)
   Name: Larkyn Greenberger  DOB: 05-03-1956  MRN: [id number]   Primary Cardiologist: Minus Breeding, MD  Chart reviewed as part of pre-operative protocol coverage  Shevette Bess Childress was last seen on 11/18/20 by Dr. Debara Pickett for lipid clinic. She was last seen by her primary cardiologist, Dr. Percival Spanish, 02/06/20.    Left VM requesting call back.   Loel Dubonnet, NP 01/18/2021, 1:12 PM

## 2021-01-19 NOTE — Telephone Encounter (Signed)
    Pamela Thomas DOB:  05-10-1956  MRN:  [id number]   Primary Cardiologist: Minus Breeding, MD  Chart reviewed as part of pre-operative protocol coverage. Patient was last seen by Dr. Debara Pickett in 11/2020. She was contacted today for further pre-op evaluation and reports doing well since last visit. No chest pain, new or worsening shortness of breath, orthopnea, PND, palpitations, syncope. Able to complete >4.0 METS without any angina. Given past medical history and time since last visit, based on ACC/AHA guidelines, Pamela Thomas would be at acceptable risk for the planned procedure without further cardiovascular testing.   I will route this recommendation to the requesting party via Epic fax function and remove from pre-op pool.  Please call with questions.  Darreld Mclean, PA-C 01/19/2021, 3:12 PM

## 2021-01-19 NOTE — Telephone Encounter (Signed)
Patient was returning call 

## 2021-01-25 IMAGING — CR DG HIP (WITH OR WITHOUT PELVIS) 2-3V*R*
3 series · 3 of 3 positions shown · non-contrast
Comparison: None.

CLINICAL DATA: Status post fall.

EXAM:
DG HIP (WITH OR WITHOUT PELVIS) 2-3V RIGHT

[x pelvis (1 of 2)]
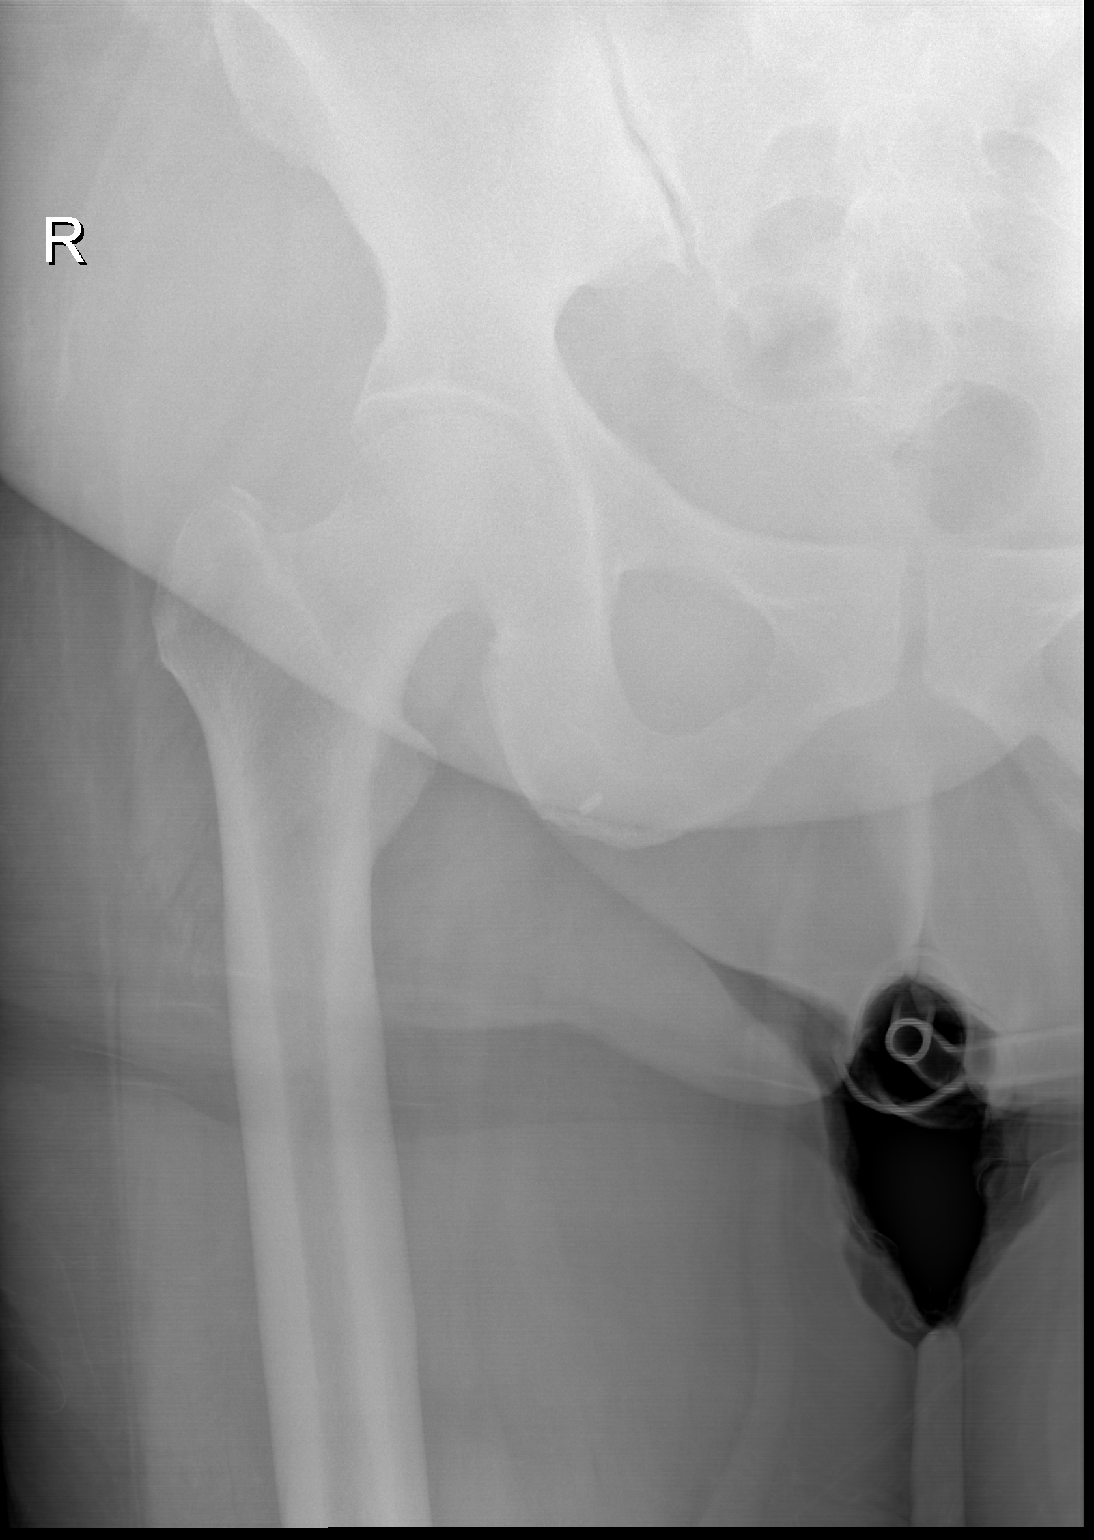

[x pelvis (2 of 2)]
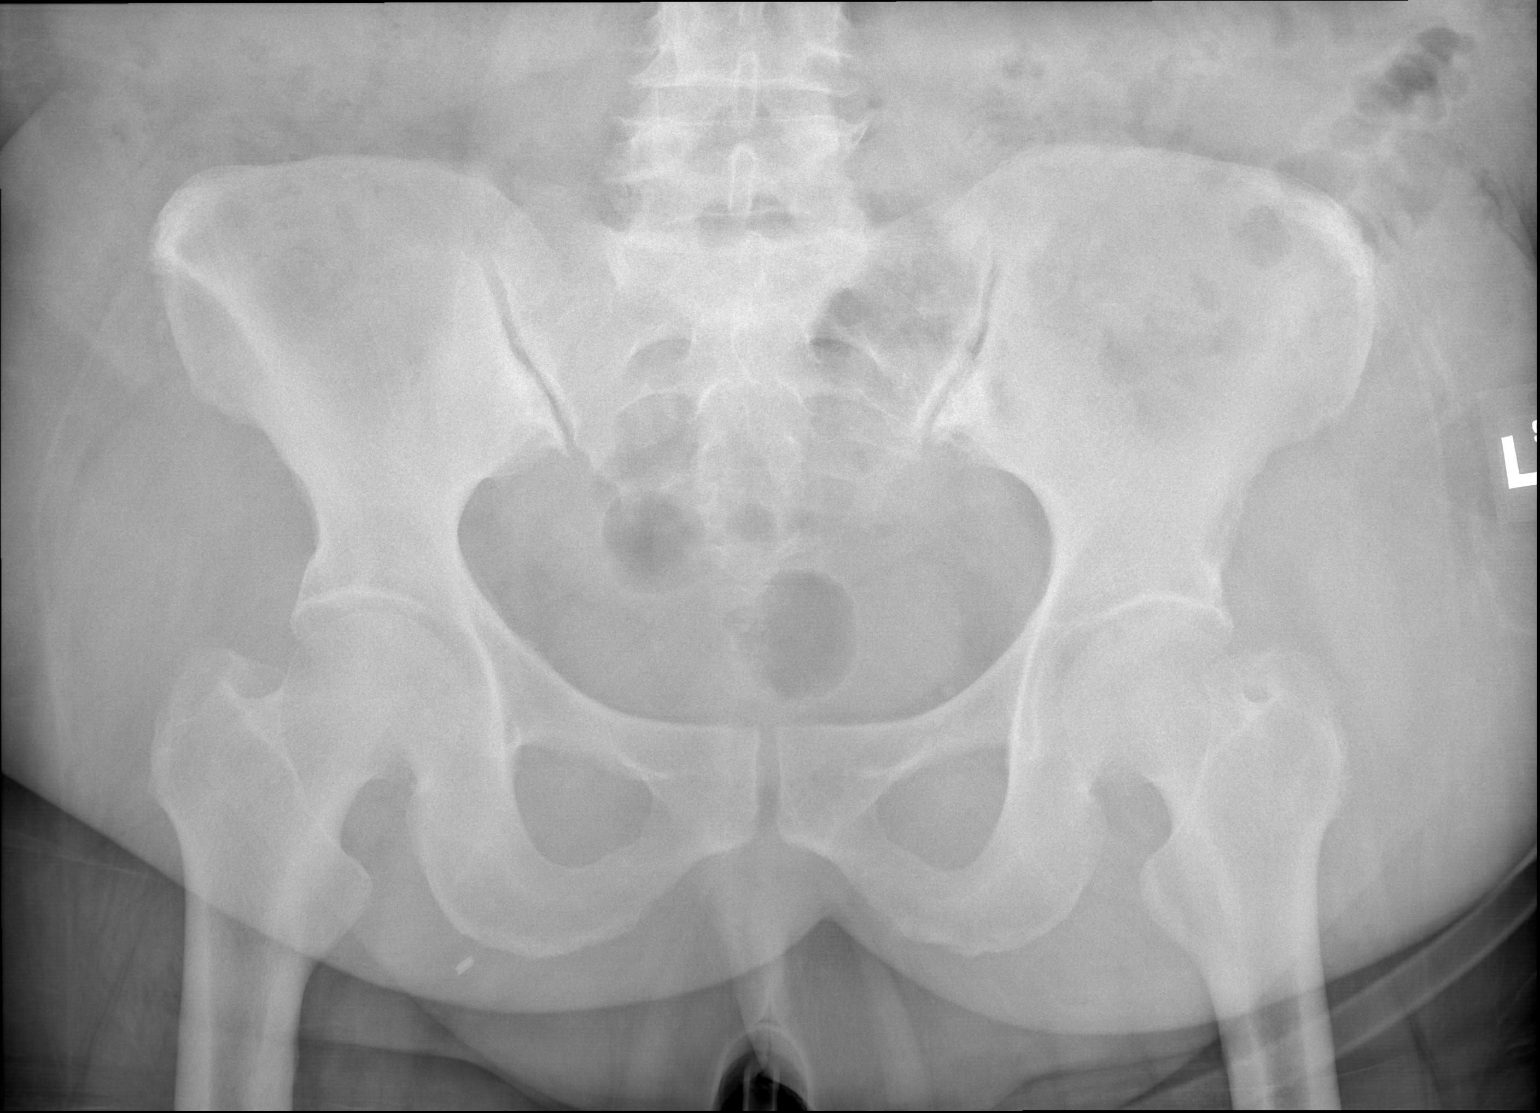

[x hip lat right]
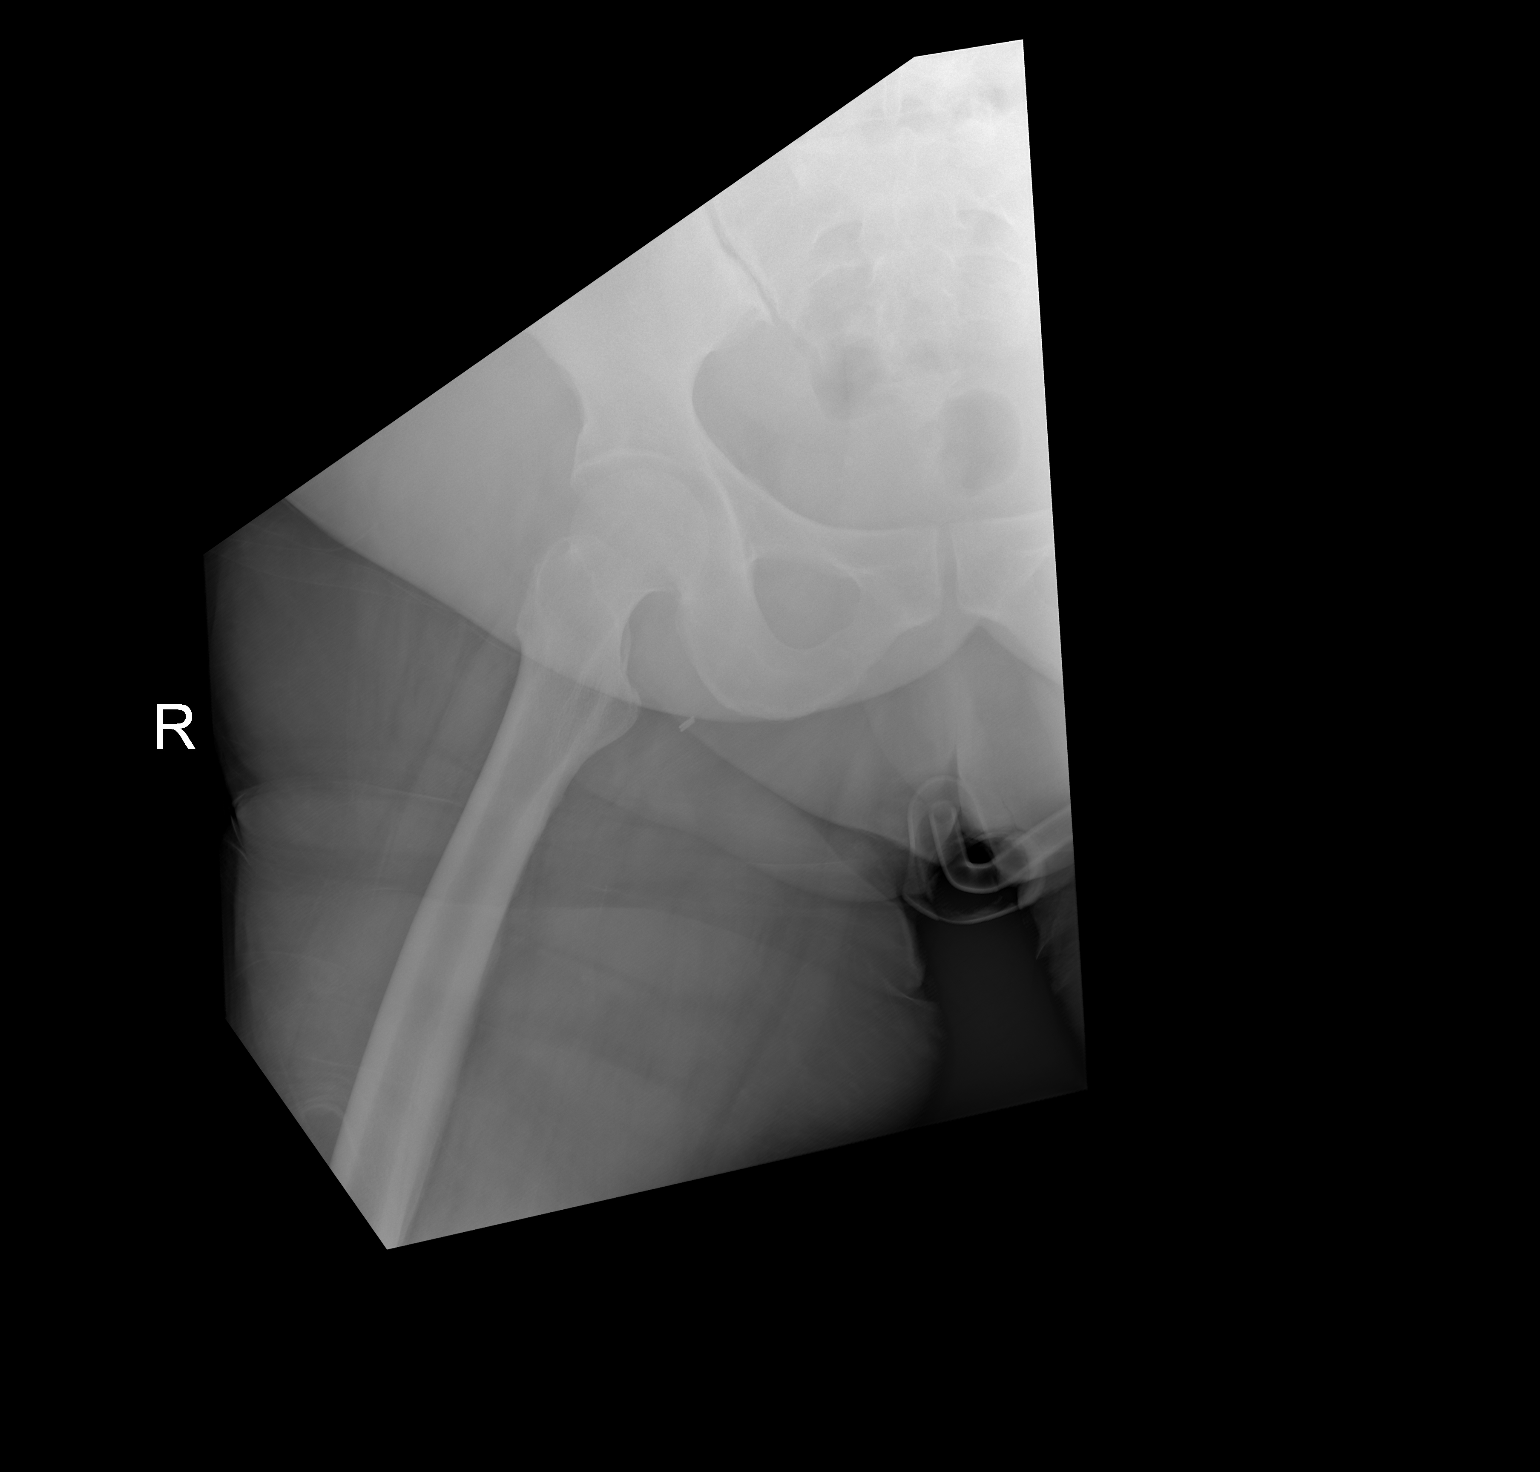

[3 of 3 positions shown; findings below may reference images not displayed]

FINDINGS: There is no evidence of hip fracture or dislocation. There is no
evidence of arthropathy or other focal bone abnormality. A single
radiopaque surgical clip is seen within the soft tissues inferior to
the right inferior pubic ramus.
IMPRESSION: No acute osseous abnormality.

## 2021-02-03 DIAGNOSIS — N182 Chronic kidney disease, stage 2 (mild): Secondary | ICD-10-CM | POA: Insufficient documentation

## 2021-02-03 NOTE — Progress Notes (Signed)
Cardiology Office Note   Date:  02/04/2021   ID:  Pamela Thomas 10-Apr-1956, MRN [id number]  PCP:  Dickie La, MD  Cardiologist:   Minus Breeding, MD   Chief Complaint  Patient presents with  . Coronary Artery Disease      History of Present Illness: Pamela Thomas is a 65 y.o. female who presents for evaluation of coronary disease.  She had an inferior infarct in [place].   She had an occluded right coronary artery.  She also had LAD 80% stenosis proximally at the takeoff of a prominent and tortuous diagonal branch.  Circumflex is very small and free of high-grade disease.  Her EF was well preserved with some probable mild inferior hypokinesis.  She had overlapping Synergy stents.  It was suggested that she have elective PCI of her LAD versus a LIMA.  She is now status post CABG.    Since I last saw her she has done well.  The patient denies any new symptoms such as chest discomfort, neck or arm discomfort. There has been no new shortness of breath, PND or orthopnea. There have been no reported palpitations, presyncope or syncope. She denies any of the jaw discomfort that was her previous angina.  She is remodeling a building and does a lot of yard work.  She is not getting any cardiovascular symptoms with this.   Past Medical History:  Diagnosis Date  . ALLERGIC RHINITIS, SEASONAL   . Anxiety   . Back pain   . BCC (basal cell carcinoma of skin) 2005   Nose [facility])  . Constipation   . Coronary artery disease   . Depression   . Diabetes mellitus without complication ([place])   . DYSLIPIDEMIA   . Dysrhythmia    palpitations-evaluated by Dr Johnsie Cancel 6/12/eccho 6/13  . Glucose intolerance (impaired glucose tolerance)   . History of blood transfusion   . Hypertension   . Joint pain   . OBESITY, NOS   . Osteoarthritis    rt knee  . PLANTAR FASCIITIS, BILATERAL   . Vertigo   . Vitamin D deficiency     Past Surgical History:  Procedure  Laterality Date  . BREAST BIOPSY  05/27/03   left  . CARDIAC CATHETERIZATION    . CARPAL TUNNEL RELEASE  05/27/03   Right  . CESAREAN SECTION  05/27/03  . CORONARY ARTERY BYPASS GRAFT N/A 08/17/2018   Procedure: CORONARY ARTERY BYPASS GRAFTING (CABG) TIMES TWO: LIMA to LAD, SVG to RAMUS INTERMEDIATE)  USING LEFT INTERNAL MAMMARY ARTERY AND RIGHT GREATER SAPHENOUS VEIN HARVESTED ENDOSCOPICALLY.;  Surgeon: [name], MD;  Location: [facility];  Service: Open Heart Surgery;  Laterality: N/A;  . KNEE SURGERY     left- removal fatty tumor  . ROTATOR CUFF REPAIR     Bilateral  . TEE WITHOUT CARDIOVERSION N/A 08/17/2018   Procedure: TRANSESOPHAGEAL ECHOCARDIOGRAM (TEE);  Surgeon: [name], MD;  Location: [facility];  Service: Open Heart Surgery;  Laterality: N/A;  . TOTAL KNEE ARTHROPLASTY  10/10/2012   Procedure: TOTAL KNEE ARTHROPLASTY;  Surgeon: Gearlean Alf, MD;  [facility];  Service: Orthopedics;  Laterality: Right;     Current Outpatient Medications  Medication Sig Dispense Refill  . acetaminophen (TYLENOL) 500 MG tablet Take 500 mg by mouth every 6 (six) hours as needed for moderate pain.     Marland Kitchen aspirin 81 MG EC tablet TAKE 1 TABLET BY MOUTH EVERY DAY 100 tablet 1  .  Biotin w/ Vitamins C & E (HAIR/SKIN/NAILS PO) Take by mouth.    . candesartan (ATACAND) 16 MG tablet TAKE 1 TABLET BY MOUTH EVERY DAY 30 tablet 11  . chlorthalidone (HYGROTON) 25 MG tablet Take 1 tablet (25 mg total) by mouth daily. 30 tablet 11  . DAILY MULTIPLE VITAMINS PO Take by mouth.    . ezetimibe (ZETIA) 10 MG tablet TAKE 1 TABLET BY MOUTH EVERY DAY 30 tablet 3  . FLUoxetine (PROZAC) 10 MG capsule TAKE ONE BY MOUTH DAILY IN ADDITION TO THE ONE TAB OF 20 MG FLUOXETINE FOR TOTAL 30 MG A DAY 90 capsule 3  . FLUoxetine (PROZAC) 20 MG tablet TAKE 1 TABLET BY MOUTH EVERY DAY 90 tablet 3  . furosemide (LASIX) 20 MG tablet Take 1 tablet (20 mg total) by mouth daily. MAY TAKE AN ADDITIONAL TABLET AS NEEDED.  (Patient taking differently: Take 20 mg by mouth daily as needed for fluid or edema. MAY TAKE AN ADDITIONAL TABLET AS NEEDED.) 60 tablet 11  . KLOR-CON M20 20 MEQ tablet TAKE 1 TABLET BY MOUTH EVERY DAY 90 tablet 3  . metFORMIN (GLUCOPHAGE) 1000 MG tablet TAKE 1 TABLET (1,000 MG TOTAL) BY MOUTH 2 (TWO) TIMES DAILY WITH A MEAL. 60 tablet 20  . metoprolol succinate (TOPROL-XL) 50 MG 24 hr tablet TAKE 1 TABLET (50 MG TOTAL) BY MOUTH 2 (TWO) TIMES DAILY. TAKE WITH OR IMMEDIATELY FOLLOWING A MEAL. 180 tablet 2  . rosuvastatin (CRESTOR) 40 MG tablet Take 1 tablet (40 mg total) by mouth daily. 90 tablet 3   No current facility-administered medications for this visit.    Allergies:   Morphine and related, Icosapent ethyl, Statins, and Oxycodone    ROS:  Please see the history of present illness.   Otherwise, review of systems are positive for none.   All other systems are reviewed and negative.    PHYSICAL EXAM: VS:  BP (!) 102/58   Pulse (!) 55   Ht 5\' 2"  (1.575 m)   Wt 232 lb 9.6 oz (105.5 kg)   LMP 10/10/2010   SpO2 97%   BMI 42.54 kg/m  , BMI Body mass index is 42.54 kg/m. GENERAL:  Well appearing NECK:  No jugular venous distention, waveform within normal limits, carotid upstroke brisk and symmetric, no bruits, no thyromegaly LUNGS:  Clear to auscultation bilaterally CHEST:  Well healed sternotomy scar. HEART:  PMI not displaced or sustained,S1 and S2 within normal limits, no S3, no S4, no clicks, no rubs, 2 out of 6 apical systolic murmur radiating slightly at the aortic outflow tract, no diastolic murmurs ABD:  Flat, positive bowel sounds normal in frequency in pitch, no bruits, no rebound, no guarding, no midline pulsatile mass, no hepatomegaly, no splenomegaly EXT:  2 plus pulses throughout, no edema, no cyanosis no clubbing  EKG:  EKG is  ordered today. Sinus rhythm, rate 55, axis within normal limits, intervals within normal limits, no acute ST-T wave changes.  Poor anterior R  wave progression   Recent Labs: No results found for requested labs within last 8760 hours.    Lipid Panel    Component Value Date/Time   CHOL 173 11/16/2020 0942   TRIG 382 (H) 11/16/2020 0942   HDL 33 (L) 11/16/2020 0942   CHOLHDL 5.2 (H) 11/16/2020 0942   CHOLHDL 5.5 05/11/2011 0932   VLDL NOT CALC 05/11/2011 0932   LDLCALC 79 11/16/2020 0942   LDLDIRECT 63 01/14/2019 1005   LDLDIRECT 76 07/27/2016 1048  Wt Readings from Last 3 Encounters:  02/04/21 232 lb 9.6 oz (105.5 kg)  11/18/20 233 lb (105.7 kg)  07/22/20 237 lb 6.4 oz (107.7 kg)    Lab Results  Component Value Date   HGBA1C 6.5 (H) 01/23/2020    Other studies Reviewed: Additional studies/ records that were reviewed today include: Labs Review of the above records demonstrates:  Please see elsewhere in the note.     ASSESSMENT AND PLAN:  CAD:   She is status post CABG.   The patient has no new sypmtoms.  No further cardiovascular testing is indicated.  We will continue with aggressive risk reduction and meds as listed.  DYSLIPIDEMIA:   LDL most recently was 65 recently.  I am going to change her from Lipitor to Crestor 40 mg daily and she needs a repeat lipid profile in 3 months.    Aortic stenosis:  She has mild AS.   I do not suspect that this change clinically.  No further imaging.   DM:   A1c was 6.5 when was checked last year and she is going to have this done soon.  CKD II:    Creat is 1.41 in May of last year and she is due to have this repeated and I will defer to her primary providers.    HTN:   Her BP is at target.  No change in therapy.   OBESITY:   We talked specifically again about activity and diet.  CAROTID STENOSIS:  1 - 39% on the right and 60 -79% on the left in Jan 2022.  She will have this followed again in January 2023.      Current medicines are reviewed at length with the patient today.  The patient does not have concerns regarding medicines.  The following changes have been  made:   As above  Labs/ tests ordered today include:    Orders Placed This Encounter  Procedures  . Lipid panel  . Hepatic function panel  . EKG 12-Lead     Disposition:   FU with me in 12 months.    Signed, Minus Breeding, MD  02/04/2021 1:33 PM    [facility]

## 2021-02-04 ENCOUNTER — Encounter: Payer: Self-pay | Admitting: Cardiology

## 2021-02-04 ENCOUNTER — Ambulatory Visit: Payer: BC Managed Care – PPO | Admitting: Cardiology

## 2021-02-04 ENCOUNTER — Other Ambulatory Visit: Payer: Self-pay

## 2021-02-04 VITALS — BP 102/58 | HR 55 | Ht 62.0 in | Wt 232.6 lb

## 2021-02-04 DIAGNOSIS — I2510 Atherosclerotic heart disease of native coronary artery without angina pectoris: Secondary | ICD-10-CM

## 2021-02-04 DIAGNOSIS — I10 Essential (primary) hypertension: Secondary | ICD-10-CM | POA: Diagnosis not present

## 2021-02-04 DIAGNOSIS — E785 Hyperlipidemia, unspecified: Secondary | ICD-10-CM | POA: Diagnosis not present

## 2021-02-04 DIAGNOSIS — I350 Nonrheumatic aortic (valve) stenosis: Secondary | ICD-10-CM

## 2021-02-04 DIAGNOSIS — E118 Type 2 diabetes mellitus with unspecified complications: Secondary | ICD-10-CM | POA: Diagnosis not present

## 2021-02-04 DIAGNOSIS — N182 Chronic kidney disease, stage 2 (mild): Secondary | ICD-10-CM

## 2021-02-04 MED ORDER — ROSUVASTATIN CALCIUM 40 MG PO TABS
40.0000 mg | ORAL_TABLET | Freq: Every day | ORAL | 3 refills | Status: DC
Start: 2021-02-04 — End: 2022-01-10

## 2021-02-04 NOTE — Patient Instructions (Signed)
Medication Instructions:  STOP: LIPITOR START: CRESTOR (ROSUVASTATIN 40mg  DAILY) *If you need a refill on your cardiac medications before your next appointment, please call your pharmacy*  Lab Work: RETURN FOR LAB WORK IN 3 MONTHS- YOU WILL NEED TO BE FASTING. NO APPOINTMENT NEEDED  If you have labs (blood work) drawn today and your tests are completely normal, you will receive your results only by: Marland Kitchen MyChart Message (if you have MyChart) OR . A paper copy in the mail If you have any lab test that is abnormal or we need to change your treatment, we will call you to review the results.  Follow-Up: At [facility], you and your health needs are our priority.  As part of our continuing mission to provide you with exceptional heart care, we have created designated Provider Care Teams.  These Care Teams include your primary Cardiologist (physician) and Advanced Practice Providers (APPs -  Physician Assistants and Nurse Practitioners) who all work together to provide you with the care you need, when you need it.   Your next appointment:   12 month(s)  The format for your next appointment:   In Person  Provider:   Minus Breeding, MD

## 2021-02-15 ENCOUNTER — Other Ambulatory Visit: Payer: Self-pay | Admitting: Cardiology

## 2021-02-16 ENCOUNTER — Other Ambulatory Visit: Payer: Self-pay | Admitting: *Deleted

## 2021-02-16 MED ORDER — FUROSEMIDE 20 MG PO TABS: 20.0000 mg | ORAL_TABLET | Freq: Every day | ORAL | 11 refills | Status: DC

## 2021-03-09 ENCOUNTER — Ambulatory Visit: Payer: BC Managed Care – PPO | Admitting: Physician Assistant

## 2021-03-15 ENCOUNTER — Other Ambulatory Visit: Payer: Self-pay | Admitting: Internal Medicine

## 2021-03-28 ENCOUNTER — Ambulatory Visit (HOSPITAL_COMMUNITY)
Admission: EM | Admit: 2021-03-28 | Discharge: 2021-03-28 | Disposition: A | Discharge status: Home / Self Care | Payer: BC Managed Care – PPO | Attending: Internal Medicine | Admitting: Internal Medicine

## 2021-03-28 ENCOUNTER — Ambulatory Visit (INDEPENDENT_AMBULATORY_CARE_PROVIDER_SITE_OTHER): Payer: BC Managed Care – PPO

## 2021-03-28 ENCOUNTER — Encounter (HOSPITAL_COMMUNITY): Payer: Self-pay

## 2021-03-28 DIAGNOSIS — M25571 Pain in right ankle and joints of right foot: Secondary | ICD-10-CM

## 2021-03-28 MED ORDER — PREDNISONE 20 MG PO TABS
40.0000 mg | ORAL_TABLET | Freq: Every day | ORAL | 0 refills | Status: AC
Start: 2021-03-28 — End: 2021-04-02

## 2021-03-28 NOTE — Discharge Instructions (Addendum)
Please go to the hospital if right ankle pain does not improve, worsens, or changes in any way. Please do not take any ibuprofen while on steroid. You may take tylenol.  You may also apply ice to the affected area for 15 minutes at a time 2-3 times daily.   Please check blood sugars at least twice daily and stop taking steroid if blood sugar is 250 or above.

## 2021-03-28 NOTE — ED Triage Notes (Signed)
Pt presents with right ankle pain X 3 days.   States she has been diagnosed with gout before and has been unable to sleep. States she is concerned that there is something more serious going on.

## 2021-03-28 NOTE — ED Provider Notes (Signed)
[place]    CSN: [id number] Arrival date & time: 03/28/21  1657      History   Chief Complaint Chief Complaint  Patient presents with   Ankle Pain    HPI Pamela Thomas is a 65 y.o. female.   Patient presents with 2 day history of right ankle pain. Denies any known injury but states that Pamela Thomas got down out of her husband's truck and may have injured it that way. Denies any history of gout or arthritis in right ankle, although states that Pamela Thomas has had arthritis flare ups in right knee and left ankle.  Rates pain 8/10 on a scale from 1-10 and describes pain as "throbbing." Patient states that bearing weight is painful. Denies any foot or leg pain. Has numbness and tingling occasionally but states this is normal for her with her other associated chronic health problems. Has taken tylenol OTC with minimal relief of symptoms. Denies any chest pain or shortness of breath.   Patient has mildly low BP. Appears to have same average BP at previous visits. Denies dizziness, blurred vision, headache. States that Pamela Thomas has not yet eaten today.    Ankle Pain  Past Medical History:  Diagnosis Date   ALLERGIC RHINITIS, SEASONAL    Anxiety    Back pain    BCC (basal cell carcinoma of skin) 2005   Nose ([place])   Constipation    Coronary artery disease    Depression    Diabetes mellitus without complication ([place])    DYSLIPIDEMIA    Dysrhythmia    palpitations-evaluated by Dr Johnsie Cancel 6/12/eccho 6/13   Glucose intolerance (impaired glucose tolerance)    History of blood transfusion    Hypertension    Joint pain    OBESITY, NOS    Osteoarthritis    rt knee   PLANTAR FASCIITIS, BILATERAL    Vertigo    Vitamin D deficiency     Patient Active Problem List   Diagnosis Date Noted   CKD (chronic kidney disease), stage II 02/03/2021   Vitamin B 12 deficiency 07/23/2020   Anosmia 07/23/2020   Hypotension 12/25/2019   Vitamin D deficiency 12/25/2019    Hypertriglyceridemia 10/17/2019   Class 3 severe obesity due to excess calories with body mass index (BMI) of 45.0 to 49.9 in adult [facility]) 10/15/2019   Dyslipidemia 07/25/2019   Nonrheumatic aortic valve stenosis 07/25/2019   Bilateral carotid artery stenosis 04/26/2019   Dyspnea 50/53/9767   Diastolic CHF ([place]) 34/19/3790   Dysthymia 01/30/2019   S/P CABG x 1 10/15/2018   Carotid atherosclerosis, bilateral 10/15/2018   Coronary artery disease 08/17/2018   STEMI (ST elevation myocardial infarction) ([place]) 07/16/2018   Hypertension 02/22/2017   Visit for preventive health examination 07/29/2016   Right knee DJD, s/p TKR 06/21/2012   Family history of malignant melanoma 06/15/2011   Type 2 diabetes mellitus with hyperglycemia, without long-term current use of insulin ([place]) 12/09/2009   CARDIAC MURMUR 05/27/2009   Hyperlipidemia associated with type 2 diabetes mellitus ([place]) 05/21/2007   ALLERGIC RHINITIS, SEASONAL 01/08/2007   Obesity 11/09/2006   HYPERTENSION, BENIGN SYSTEMIC 11/09/2006    Past Surgical History:  Procedure Laterality Date   BREAST BIOPSY  05/27/03   left   CARDIAC CATHETERIZATION     CARPAL TUNNEL RELEASE  05/27/03   Right   CESAREAN SECTION  05/27/03   CORONARY ARTERY BYPASS GRAFT N/A 08/17/2018   Procedure: CORONARY ARTERY BYPASS GRAFTING (CABG) TIMES TWO: LIMA to LAD, SVG to  RAMUS INTERMEDIATE)  USING LEFT INTERNAL MAMMARY ARTERY AND RIGHT GREATER SAPHENOUS VEIN HARVESTED ENDOSCOPICALLY.;  Surgeon: [name], MD;  Location: [facility];  Service: Open Heart Surgery;  Laterality: N/A;   KNEE SURGERY     left- removal fatty tumor   ROTATOR CUFF REPAIR     Bilateral   TEE WITHOUT CARDIOVERSION N/A 08/17/2018   Procedure: TRANSESOPHAGEAL ECHOCARDIOGRAM (TEE);  Surgeon: [name], MD;  Location: [place];  Service: Open Heart Surgery;  Laterality: N/A;   TOTAL KNEE ARTHROPLASTY  10/10/2012   Procedure: TOTAL KNEE ARTHROPLASTY;  Surgeon: Gearlean Alf, MD;   [facility];  Service: Orthopedics;  Laterality: Right;    OB History     Gravida  2   Para  2   Term      Preterm      AB      Living         SAB      IAB      Ectopic      Multiple      Live Births               Home Medications    Prior to Admission medications   Medication Sig Start Date End Date Taking? Authorizing Provider  chlorthalidone (HYGROTON) 25 MG tablet TAKE 1 TABLET BY MOUTH EVERY DAY 02/15/21   Minus Breeding, MD  predniSONE (DELTASONE) 20 MG tablet Take 2 tablets (40 mg total) by mouth daily for 5 days. 03/28/21 04/02/21 Yes Odis Luster, FNP  acetaminophen (TYLENOL) 500 MG tablet Take 500 mg by mouth every 6 (six) hours as needed for moderate pain.     [provider]  aspirin 81 MG EC tablet TAKE 1 TABLET BY MOUTH EVERY DAY 11/17/20   Dickie La, MD  Biotin w/ Vitamins C & E (HAIR/SKIN/NAILS PO) Take by mouth.    [provider]  candesartan (ATACAND) 16 MG tablet TAKE 1 TABLET BY MOUTH EVERY DAY 01/14/21   Minus Breeding, MD  DAILY MULTIPLE VITAMINS PO Take by mouth.    [provider]  ezetimibe (ZETIA) 10 MG tablet TAKE 1 TABLET BY MOUTH EVERY DAY 03/15/21   Hilty, Nadean Corwin, MD  FLUoxetine (PROZAC) 10 MG capsule TAKE ONE BY MOUTH DAILY IN ADDITION TO THE ONE TAB OF 20 MG FLUOXETINE FOR TOTAL 30 MG A DAY 12/07/20   Dickie La, MD  FLUoxetine (PROZAC) 20 MG tablet TAKE 1 TABLET BY MOUTH EVERY DAY 12/15/20   Dickie La, MD  furosemide (LASIX) 20 MG tablet Take 1 tablet (20 mg total) by mouth daily. MAY TAKE AN ADDITIONAL TABLET AS NEEDED. 02/16/21   Minus Breeding, MD  KLOR-CON M20 20 MEQ tablet TAKE 1 TABLET BY MOUTH EVERY DAY 08/18/20   Dickie La, MD  metFORMIN (GLUCOPHAGE) 1000 MG tablet TAKE 1 TABLET (1,000 MG TOTAL) BY MOUTH 2 (TWO) TIMES DAILY WITH A MEAL. 08/18/20   Dickie La, MD  metoprolol succinate (TOPROL-XL) 50 MG 24 hr tablet TAKE 1 TABLET (50 MG TOTAL) BY MOUTH 2 (TWO) TIMES DAILY. TAKE WITH OR  IMMEDIATELY FOLLOWING A MEAL. 07/14/20   Dickie La, MD  rosuvastatin (CRESTOR) 40 MG tablet Take 1 tablet (40 mg total) by mouth daily. 02/04/21   Minus Breeding, MD    Family History Family History  Problem Relation Age of Onset   COPD Mother        Deceased   High blood  pressure Mother    High Cholesterol Mother    Depression Mother    Lung cancer Father        Deceaesd   High Cholesterol Father    Diabetes Other    Melanoma Brother        Deceased   COPD Brother    Healthy Daughter     Social History Social History   Tobacco Use   Smoking status: Former    Types: Cigarettes    Quit date: 09/12/1990    Years since quitting: 30.5   Smokeless tobacco: Never  Vaping Use   Vaping Use: Never used  Substance Use Topics   Alcohol use: Yes    Alcohol/week: 0.0 standard drinks    Comment: rare   Drug use: No     Allergies   Morphine and related, Icosapent ethyl, Statins, and Oxycodone   Review of Systems Review of Systems Per HPI  Physical Exam Triage Vital Signs ED Triage Vitals  Enc Vitals Group     BP 03/28/21 1755 (!) 121/47     Pulse Rate 03/28/21 1754 71     Resp 03/28/21 1754 17     Temp 03/28/21 1755 98.1 F (36.7 C)     Temp Source 03/28/21 1755 Oral     SpO2 03/28/21 1754 100 %     Weight --      Height --      Head Circumference --      Peak Flow --      Pain Score 03/28/21 1753 8     Pain Loc --      Pain Edu? --      Excl. in [place]? --    No data found.  Updated Vital Signs BP (!) 121/47 (BP Location: Right Arm)   Pulse 71   Temp 98.1 F (36.7 C) (Oral)   Resp 17   LMP 10/10/2010   SpO2 100%   Visual Acuity Right Eye Distance:   Left Eye Distance:   Bilateral Distance:    Right Eye Near:   Left Eye Near:    Bilateral Near:     Physical Exam Constitutional:      General: Pamela Thomas is not in acute distress.    Appearance: Normal appearance.  HENT:     Head: Normocephalic and atraumatic.  Eyes:     Extraocular Movements:  Extraocular movements intact.     Conjunctiva/sclera: Conjunctivae normal.  Pulmonary:     Effort: Pulmonary effort is normal.  Musculoskeletal:     Right ankle: Swelling present. No deformity, ecchymosis or lacerations. Tenderness present. Normal pulse.     Left ankle: Normal.     Comments: Tenderness to palpation that is generalized throughout right ankle. Limited ROM due to pain. No erythema present. Patient does have mild swelling localized to right ankle. Normal capillary refill and pedal pulses. Denies calf pain.   Neurological:     General: No focal deficit present.     Mental Status: Pamela Thomas is alert and oriented to person, place, and time. Mental status is at baseline.     Sensory: Sensation is intact.  Psychiatric:        Mood and Affect: Mood normal.        Behavior: Behavior normal.        Thought Content: Thought content normal.        Judgment: Judgment normal.     UC Treatments / Results  Labs (all labs ordered are listed, but only abnormal results  are displayed) Labs Reviewed - No data to display  EKG   Radiology DG Ankle Complete Right  Result Date: 03/28/2021 CLINICAL DATA:  Ankle pain EXAM: RIGHT ANKLE - COMPLETE 3+ VIEW COMPARISON:  None. FINDINGS: No fracture or malalignment. Diffuse soft tissue swelling. Ankle mortise is symmetric. Large plantar calcaneal spur IMPRESSION: No acute osseous abnormality Electronically Signed   By: Donavan Foil M.D.   On: 03/28/2021 18:26    Procedures Procedures (including critical care time)  Medications Ordered in UC Medications - No data to display  Initial Impression / Assessment and Plan / UC Course  I have reviewed the triage vital signs and the nursing notes.  Pertinent labs & imaging results that were available during my care of the patient were reviewed by me and considered in my medical decision making (see chart for details).     Right ankle x-ray was negative for any acute fracture, dislocation, or bony  abnormality. Clinical signs and symptoms most consistent with arthritis to right ankle especially given patient's history of osteoarthritis and characteristics of pain. Low suspicion of DVT due to physical exam showing no signs of erythema, calf pain, decreased capillary refill or pulses. Pain is also localized to right ankle. Patient is not able to have ketorolac injection or OTC NSAID's due to history of CABG and elevated creatinine. Will treat with prednisone x5 days to decrease inflammation. Patient to monitor blood glucose very closely and to stop taking if it becomes elevated. Patient voiced understanding. May also take tylenol as needed. Ice application as needed. Patient was advised to go to the hospital if pain worsens, does not improve, or if pain or sensation changes in any way. Patient to follow up with PCP as well. Discussed strict return precautions. Patient verbalized understanding and is agreeable with plan.  Final Clinical Impressions(s) / UC Diagnoses   Final diagnoses:  Acute right ankle pain     Discharge Instructions      Please go to the hospital if right ankle pain does not improve, worsens, or changes in any way. Please do not take any ibuprofen while on steroid. You may take tylenol.  You may also apply ice to the affected area for 15 minutes at a time 2-3 times daily.   Please check blood sugars at least twice daily and stop taking steroid if blood sugar is 250 or above.      ED Prescriptions     Medication Sig Dispense Auth. Provider   predniSONE (DELTASONE) 20 MG tablet Take 2 tablets (40 mg total) by mouth daily for 5 days. 10 tablet Odis Luster, FNP      PDMP not reviewed this encounter.   Odis Luster, FNP 03/28/21 1849

## 2021-04-02 ENCOUNTER — Ambulatory Visit (INDEPENDENT_AMBULATORY_CARE_PROVIDER_SITE_OTHER): Payer: BC Managed Care – PPO

## 2021-04-02 ENCOUNTER — Ambulatory Visit: Payer: BC Managed Care – PPO | Admitting: Family Medicine

## 2021-04-02 ENCOUNTER — Other Ambulatory Visit: Payer: Self-pay

## 2021-04-02 VITALS — BP 140/70 | HR 60 | Ht 62.0 in | Wt 235.2 lb

## 2021-04-02 DIAGNOSIS — M25571 Pain in right ankle and joints of right foot: Secondary | ICD-10-CM

## 2021-04-02 DIAGNOSIS — N182 Chronic kidney disease, stage 2 (mild): Secondary | ICD-10-CM | POA: Diagnosis not present

## 2021-04-02 DIAGNOSIS — E1165 Type 2 diabetes mellitus with hyperglycemia: Secondary | ICD-10-CM | POA: Diagnosis not present

## 2021-04-02 DIAGNOSIS — Z23 Encounter for immunization: Secondary | ICD-10-CM

## 2021-04-02 DIAGNOSIS — I10 Essential (primary) hypertension: Secondary | ICD-10-CM

## 2021-04-02 LAB — POCT GLYCOSYLATED HEMOGLOBIN (HGB A1C): HbA1c, POC (controlled diabetic range): 7.2 % — AB (ref 0.0–7.0)

## 2021-04-02 NOTE — Assessment & Plan Note (Addendum)
A1c today elevated from prior at 7.2. Patient recently had 2 injuries in which she was treated with prednisone. This could have contributed to elevation, though will need to closely monitor and repeat A1c in 3 months.  - Continue current regiment - Recheck A1c in 3 months

## 2021-04-02 NOTE — Assessment & Plan Note (Addendum)
Right ankle pain without known specific injury.  Had a similar presentation of the left ankle several weeks ago.  Was evaluated at urgent care and x-rays without signs of fracture and patient was given a prednisone dose pack with improvement.  Physical exam is overall reassuring as prior imaging showed no concern for fracture at that time, if there was any bony abnormality it would have been very small and in the process of healing.  Joint did not appear very arthritic on imaging, unsure that this was contributing to patient's pain picture; consideration for gout although patient has no history of gout and clinical history and exam are less suspicious for it.  At this moment, appears more likely that patient may have had a mild sprain of the right ankle. - RICE method encouraged - Can use compression sleeve/sock on the foot for stability as needed - Voltaren gel 3-4 times daily as needed - We will check uric acid level to evaluate for gout - Patient instructed to follow-up in the next 2 to 4 weeks if no improvement

## 2021-04-02 NOTE — Patient Instructions (Addendum)
I think that you may have had a little sprained your ankle, reassuringly your imaging previously was negative for any fracture.  Some things that you can do and I recommended include icing the area, rest and elevation as able.  You can use compression socks or sleeves for your ankle to help give you some stability if that makes you feel better.  I also recommend using Voltaren gel, which is found over-the-counter, on the area 3-4 times per day as needed.    We are going to get some labs today to check your kidney function, diabetes as well as a uric acid level to tell us if there is any component of gout to this.

## 2021-04-02 NOTE — Progress Notes (Signed)
SUBJECTIVE:   CHIEF COMPLAINT / HPI:   Acute right ankle pain Patient notes that on 7/15 she felt that her "foot was tight" before she went to bed and that when she woke up she was unable to bear weight on it.  She had a similar presentation several weeks prior on her left ankle while in [place] and had to use a wheelchair and her husband's low-dose prednisone at that time.  On 7/17, patient went to the urgent care for evaluation and was found to have no ankle/foot fracture and was given prednisone with improvement.  She is not able to walk on the ankle though she is still having some pain she is concerned that she is getting ready to leave next Wednesday for a trip to [place] for 3 weeks.  She is using Tylenol as needed for her pain.   PERTINENT  PMH / PSH: Reviewed  OBJECTIVE:   BP 140/70   Pulse 60   Ht 5\' 2"  (1.575 m)   Wt 235 lb 4 oz (106.7 kg)   LMP 10/10/2010   SpO2 98%   BMI 43.03 kg/m   Gen: well-appearing, NAD Pulm: Breathing easily on room air, no increased work of breathing  Right Ankle: Inspection: Mild swelling of the lateral ankle. No visible erythema. Palpation: Discomfort with palpation at the base of 5th MT; No tenderness over cuboid; No tenderness over N spot or navicular prominence Mild tenderness on posterior aspects of lateral and medial malleolus Talar dome nontender; Range of motion is full in all directions. Strength is 5/5 in all directions. Stable lateral and medial ligaments; squeeze test and kleiger test unremarkable; No significant hypermobility in testing ankle ligaments No sign of peroneal tendon subluxations; Negative tarsal tunnel tinel's Able to walk 4 steps. No Tinel's or positive signs of loss of sensation over the foot or ankle    ASSESSMENT/PLAN:   Acute right ankle pain Right ankle pain without known specific injury.  Had a similar presentation of the left ankle several weeks ago.  Was evaluated at urgent care and x-rays without  signs of fracture and patient was given a prednisone dose pack with improvement.  Physical exam is overall reassuring as prior imaging showed no concern for fracture at that time, if there was any bony abnormality it would have been very small and in the process of healing.  Joint did not appear very arthritic on imaging, unsure that this was contributing to patient's pain picture; consideration for gout although patient has no history of gout and clinical history and exam are less suspicious for it.  At this moment, appears more likely that patient may have had a mild sprain of the right ankle. - RICE method encouraged - Can use compression sleeve/sock on the foot for stability as needed - Voltaren gel 3-4 times daily as needed - We will check uric acid level to evaluate for gout - Patient instructed to follow-up in the next 2 to 4 weeks if no improvement  Type 2 diabetes mellitus with hyperglycemia, without long-term current use of insulin (HCC) A1c today elevated from prior at 7.2. Patient recently had 2 injuries in which she was treated with prednisone. This could have contributed to elevation, though will need to closely monitor and repeat A1c in 3 months.  - Continue current regiment - Recheck A1c in 3 months  CKD (chronic kidney disease), stage II Last CMP/BMP collected was in May 2021. - We will check BMP today  HYPERTENSION, BENIGN SYSTEMIC  BP currently elevated at 140/70.  Of note, patient has had multiple measurements of lower blood pressures at prior clinical examinations as well as in the urgent care.  Possibly elevated secondary to pain.  At this time we will continue to monitor closely.     Rise Patience, [place]

## 2021-04-02 NOTE — Assessment & Plan Note (Signed)
BP currently elevated at 140/70.  Of note, patient has had multiple measurements of lower blood pressures at prior clinical examinations as well as in the urgent care.  Possibly elevated secondary to pain.  At this time we will continue to monitor closely.

## 2021-04-02 NOTE — Assessment & Plan Note (Signed)
Last CMP/BMP collected was in May 2021. - We will check BMP today

## 2021-04-03 LAB — BASIC METABOLIC PANEL
BUN/Creatinine Ratio: 22 (ref 12–28)
BUN: 25 mg/dL (ref 8–27)
CO2: 21 mmol/L (ref 20–29)
Calcium: 9.6 mg/dL (ref 8.7–10.3)
Chloride: 101 mmol/L (ref 96–106)
Creatinine, Ser: 1.16 mg/dL — ABNORMAL HIGH (ref 0.57–1.00)
Glucose: 123 mg/dL — ABNORMAL HIGH (ref 65–99)
Potassium: 4 mmol/L (ref 3.5–5.2)
Sodium: 143 mmol/L (ref 134–144)
eGFR: 52 mL/min/{1.73_m2} — ABNORMAL LOW (ref >59)

## 2021-04-03 LAB — URIC ACID: Uric Acid: 8 mg/dL — ABNORMAL HIGH (ref 3.0–7.2)

## 2021-04-15 ENCOUNTER — Other Ambulatory Visit: Payer: Self-pay | Admitting: Family Medicine

## 2021-04-21 ENCOUNTER — Ambulatory Visit: Payer: BC Managed Care – PPO | Admitting: Family Medicine

## 2021-04-28 ENCOUNTER — Encounter: Payer: Self-pay | Admitting: Family Medicine

## 2021-04-28 ENCOUNTER — Other Ambulatory Visit: Payer: Self-pay

## 2021-04-28 ENCOUNTER — Ambulatory Visit: Payer: BC Managed Care – PPO | Admitting: Family Medicine

## 2021-04-28 VITALS — BP 132/74 | HR 71 | Ht 62.0 in | Wt 228.4 lb

## 2021-04-28 DIAGNOSIS — N182 Chronic kidney disease, stage 2 (mild): Secondary | ICD-10-CM

## 2021-04-28 DIAGNOSIS — R202 Paresthesia of skin: Secondary | ICD-10-CM | POA: Diagnosis not present

## 2021-04-28 DIAGNOSIS — E790 Hyperuricemia without signs of inflammatory arthritis and tophaceous disease: Secondary | ICD-10-CM | POA: Insufficient documentation

## 2021-04-28 DIAGNOSIS — F341 Dysthymic disorder: Secondary | ICD-10-CM

## 2021-04-28 DIAGNOSIS — E559 Vitamin D deficiency, unspecified: Secondary | ICD-10-CM

## 2021-04-28 DIAGNOSIS — E1165 Type 2 diabetes mellitus with hyperglycemia: Secondary | ICD-10-CM | POA: Diagnosis not present

## 2021-04-28 NOTE — Progress Notes (Signed)
    CHIEF COMPLAINT / HPI: #1.  2 episodes of ankle/foot pain.  First 1 in left foot.  Occurred June 11.  No precipitating injury.  Quite severe pain keeping her in a wheelchair for a week.  Unfortunately it was during her daughter's wedding in [place].  Someone gave her some prednisone and it helped a little bit and then it gradually improved 90%.  Almost 1 month later, she had similar acute onset right ankle pain.  This was slightly different and that it felt tight and was more located in the ankle.  Both of these were so severe she had difficulty walking.  Was seen in urgent care for the right foot/ankle and given prednisone for 5 days and it essentially resolved.  She is concerned about gout.  #2.  Questions about diagnosis of chronic kidney disease that she noted in her chart #3.  Wants to decrease fluoxetine back to 20 mg daily.  On the 30 mg dose she feels sleepy even though it does seem to make some difference in her symptoms at times.  Requests removal from the chart.  She will call the pharmacy.   PERTINENT  PMH / PSH: I have reviewed the patient's medications, allergies, past medical and surgical history, smoking status and updated in the EMR as appropriate.   OBJECTIVE:  BP 132/74   Pulse 71   Ht 5\' 2"  (1.575 m)   Wt 228 lb 6.4 oz (103.6 kg)   LMP 10/10/2010   SpO2 96%   BMI 41.77 kg/m  Vital signs reviewed. GENERAL: Well-developed, well-nourished, no acute distress. MSK: Movement of extremity x 4.Normal muscle bulk and tone both lower extremities. NEURO: Slight decreased sensation to soft touch bilateral feet mostly on the plantar surface.    TESTS: Reviewed her x-rays from urgent care of the right ankle and I concur there is no sign of significant arthropathy, no sign of fracture.  No effusion is noted. ASSESSMENT / PLAN:   Paresthesia of both feet Her acute episode sounds like she had really severe symptoms which is puzzling.  Could be related to back issue,  polyneuropathy related to diabetes but this does not quite fit the scenario.  We will do some basic labs including rechecking uric acid.  She did have elevated level.  Reviewed medicines, she had changed to rosuvastatin from atorvastatin but it was after the episode even though they changed her in May, she had some remaining atorvastatin so she did not really change medication until end of June.  We will consider starting allopurinol after I see her blood work.  She has other episodes, would probably refer her to neurology for evaluation of peripheral neuropathy.  CKD (chronic kidney disease), stage II We discussed what this diagnosis means.  Continue to follow creatinine q. Year/every 6 months as indicated.  Vitamin D deficiency Check vitamin D level today.    Dysthymia Doing well.  Will decrease dose to 20 mg of fluoxetine daily as per her request.  Follow-up 3 to 6 months, sooner with problems.   [name] MD

## 2021-04-28 NOTE — Assessment & Plan Note (Signed)
Her acute episode sounds like she had really severe symptoms which is puzzling.  Could be related to back issue, polyneuropathy related to diabetes but this does not quite fit the scenario.  We will do some basic labs including rechecking uric acid.  She did have elevated level.  Reviewed medicines, she had changed to rosuvastatin from atorvastatin but it was after the episode even though they changed her in May, she had some remaining atorvastatin so she did not really change medication until end of June.  We will consider starting allopurinol after I see her blood work.  She has other episodes, would probably refer her to neurology for evaluation of peripheral neuropathy.

## 2021-04-28 NOTE — Assessment & Plan Note (Signed)
Check vitamin D level today 

## 2021-04-28 NOTE — Assessment & Plan Note (Signed)
We discussed what this diagnosis means.  Continue to follow creatinine q. Year/every 6 months as indicated.

## 2021-04-28 NOTE — Assessment & Plan Note (Signed)
Doing well.  Will decrease dose to 20 mg of fluoxetine daily as per her request.  Follow-up 3 to 6 months, sooner with problems.

## 2021-04-28 NOTE — Patient Instructions (Signed)
Please see me in about 2 months  Call or let me know through My Chart if you have other symptoms in your feetsies

## 2021-04-29 ENCOUNTER — Encounter: Payer: Self-pay | Admitting: Family Medicine

## 2021-04-29 MED ORDER — COLCHICINE 0.6 MG PO TABS: 0.6000 mg | ORAL_TABLET | Freq: Every day | ORAL | 0 refills | Status: DC

## 2021-04-29 MED ORDER — ALLOPURINOL 100 MG PO TABS: 100.0000 mg | ORAL_TABLET | Freq: Every day | ORAL | 2 refills | Status: DC

## 2021-05-01 LAB — URIC ACID: Uric Acid: 9.9 mg/dL — ABNORMAL HIGH (ref 3.0–7.2)

## 2021-05-01 LAB — TSH: TSH: 4.23 u[IU]/mL (ref 0.450–4.500)

## 2021-05-01 LAB — METHYLMALONIC ACID, SERUM: Methylmalonic Acid: 249 nmol/L (ref 0–378)

## 2021-05-01 LAB — VITAMIN D 25 HYDROXY (VIT D DEFICIENCY, FRACTURES): Vit D, 25-Hydroxy: 32.1 ng/mL (ref 30.0–100.0)

## 2021-05-01 LAB — SEDIMENTATION RATE: Sed Rate: 39 mm/hr (ref 0–40)

## 2021-05-21 ENCOUNTER — Other Ambulatory Visit: Payer: Self-pay | Admitting: Family Medicine

## 2021-06-07 ENCOUNTER — Other Ambulatory Visit: Payer: Self-pay | Admitting: Family Medicine

## 2021-06-08 ENCOUNTER — Other Ambulatory Visit: Payer: Self-pay

## 2021-06-08 ENCOUNTER — Ambulatory Visit: Payer: BC Managed Care – PPO | Admitting: Physician Assistant

## 2021-06-08 ENCOUNTER — Encounter: Payer: Self-pay | Admitting: Physician Assistant

## 2021-06-08 DIAGNOSIS — Z85820 Personal history of malignant melanoma of skin: Secondary | ICD-10-CM

## 2021-06-08 DIAGNOSIS — L570 Actinic keratosis: Secondary | ICD-10-CM

## 2021-06-08 DIAGNOSIS — Z1283 Encounter for screening for malignant neoplasm of skin: Secondary | ICD-10-CM | POA: Diagnosis not present

## 2021-06-08 DIAGNOSIS — D485 Neoplasm of uncertain behavior of skin: Secondary | ICD-10-CM

## 2021-06-08 DIAGNOSIS — Z85828 Personal history of other malignant neoplasm of skin: Secondary | ICD-10-CM | POA: Diagnosis not present

## 2021-06-08 DIAGNOSIS — L820 Inflamed seborrheic keratosis: Secondary | ICD-10-CM

## 2021-06-08 DIAGNOSIS — L304 Erythema intertrigo: Secondary | ICD-10-CM | POA: Diagnosis not present

## 2021-06-08 MED ORDER — TOLAK 4 % EX CREA: TOPICAL_CREAM | CUTANEOUS | 0 refills | Status: DC

## 2021-06-08 MED ORDER — KETOCONAZOLE 2 % EX CREA: TOPICAL_CREAM | CUTANEOUS | 0 refills | Status: DC

## 2021-06-08 NOTE — Patient Instructions (Signed)
Pick up over the counter hydrocortisone and mix with ketoconazole equal parts     Biopsy, Surgery (Curettage) & Surgery (Excision) Aftercare Instructions  1. Okay to remove bandage in 24 hours  2. Wash area with soap and water  3. Apply Vaseline to area twice daily until healed (Not Neosporin)  4. Okay to cover with a Band-Aid to decrease the chance of infection or prevent irritation from clothing; also it's okay to uncover lesion at home.  5. Suture instructions: return to our office in 7-10 or 10-14 days for a nurse visit for suture removal. Variable healing with sutures, if pain or itching occurs call our office. It's okay to shower or bathe 24 hours after sutures are given.  6. The following risks may occur after a biopsy, curettage or excision: bleeding, scarring, discoloration, recurrence, infection (redness, yellow drainage, pain or swelling).  7. For questions, concerns and results call our office at [place] before 4pm & Friday before 3pm. Biopsy results will be available in 1 week.

## 2021-06-08 NOTE — Progress Notes (Signed)
   Follow-Up Visit   Subjective  Tiyanna Larcom is a 65 y.o. female who presents for the following: Annual Exam (Personal history of bcc and mohs, no new concerns).   The following portions of the chart were reviewed this encounter and updated as appropriate:  Tobacco  Allergies  Meds  Problems  Med Hx  Surg Hx  Fam Hx      Objective  Well appearing patient in no apparent distress; mood and affect are within normal limits.  A full examination was performed including scalp, head, eyes, ears, nose, lips, neck, chest, axillae, abdomen, back, buttocks, bilateral upper extremities, bilateral lower extremities, hands, feet, fingers, toes, fingernails, and toenails. All findings within normal limits unless otherwise noted below.  Left Hip Thick crusty plaque. No photo  Chest - Medial (Center) Thick crusty, skin toned plaque. No photo  left cheek Erythematous patches with gritty scale.   Assessment & Plan  Neoplasm of uncertain behavior of skin (2) Left Hip  Skin / nail biopsy Type of biopsy: tangential   Informed consent: discussed and consent obtained   Timeout: patient name, date of birth, surgical site, and procedure verified   Anesthesia: the lesion was anesthetized in a standard fashion   Anesthetic:  1% lidocaine w/ epinephrine 1-100,000 local infiltration Instrument used: flexible razor blade   Hemostasis achieved with: aluminum chloride and electrodesiccation   Outcome: patient tolerated procedure well   Post-procedure details: wound care instructions given    Specimen 1 - Surgical pathology Differential Diagnosis: isk  Check Margins: No  Chest - Medial (Center)  Skin / nail biopsy Type of biopsy: tangential   Informed consent: discussed and consent obtained   Timeout: patient name, date of birth, surgical site, and procedure verified   Anesthesia: the lesion was anesthetized in a standard fashion   Anesthetic:  1% lidocaine w/ epinephrine 1-100,000  local infiltration Instrument used: flexible razor blade   Hemostasis achieved with: aluminum chloride and electrodesiccation   Outcome: patient tolerated procedure well   Post-procedure details: wound care instructions given    Specimen 2 - Surgical pathology Differential Diagnosis: isk  Check Margins: No  AK (actinic keratosis) left cheek  Destruction of lesion - left cheek Complexity: simple   Destruction method: cryotherapy   Informed consent: discussed and consent obtained   Timeout:  patient name, date of birth, surgical site, and procedure verified Lesion destroyed using liquid nitrogen: Yes   Cryotherapy cycles:  1 Outcome: patient tolerated procedure well with no complications   Post-procedure details: wound care instructions given    Fluorouracil (TOLAK) 4 % CREA - left cheek Apply to face nightly for 2 weeks  Intertrigo Left Inframammary Fold; Right Inframammary Fold  Related Medications ketoconazole (NIZORAL) 2 % cream Apply to AA 1-2 times daily    I, Jaslynn Thome, PA-C, have reviewed all documentation's for this visit.  The documentation on 06/08/21 for the exam, diagnosis, procedures and orders are all accurate and complete.

## 2021-06-22 ENCOUNTER — Other Ambulatory Visit: Payer: Self-pay | Admitting: Family Medicine

## 2021-07-07 ENCOUNTER — Ambulatory Visit: Payer: BC Managed Care – PPO

## 2021-07-12 ENCOUNTER — Other Ambulatory Visit: Payer: Self-pay | Admitting: Family Medicine

## 2021-07-17 ENCOUNTER — Other Ambulatory Visit: Payer: Self-pay | Admitting: Family Medicine

## 2021-08-10 ENCOUNTER — Other Ambulatory Visit: Payer: Self-pay | Admitting: Family Medicine

## 2021-08-16 ENCOUNTER — Other Ambulatory Visit: Payer: Self-pay | Admitting: Family Medicine

## 2021-08-25 ENCOUNTER — Encounter: Payer: Self-pay | Admitting: Family Medicine

## 2021-08-25 ENCOUNTER — Encounter: Payer: Self-pay | Admitting: Neurology

## 2021-08-25 ENCOUNTER — Ambulatory Visit (INDEPENDENT_AMBULATORY_CARE_PROVIDER_SITE_OTHER): Payer: BC Managed Care – PPO

## 2021-08-25 ENCOUNTER — Ambulatory Visit: Payer: BC Managed Care – PPO | Admitting: Family Medicine

## 2021-08-25 ENCOUNTER — Other Ambulatory Visit: Payer: Self-pay

## 2021-08-25 VITALS — BP 108/56 | HR 61 | Ht 62.0 in | Wt 229.2 lb

## 2021-08-25 DIAGNOSIS — Z23 Encounter for immunization: Secondary | ICD-10-CM

## 2021-08-25 DIAGNOSIS — R42 Dizziness and giddiness: Secondary | ICD-10-CM

## 2021-08-25 DIAGNOSIS — E1165 Type 2 diabetes mellitus with hyperglycemia: Secondary | ICD-10-CM | POA: Diagnosis not present

## 2021-08-25 DIAGNOSIS — R296 Repeated falls: Secondary | ICD-10-CM

## 2021-08-25 DIAGNOSIS — I2510 Atherosclerotic heart disease of native coronary artery without angina pectoris: Secondary | ICD-10-CM | POA: Diagnosis not present

## 2021-08-25 DIAGNOSIS — R300 Dysuria: Secondary | ICD-10-CM | POA: Diagnosis not present

## 2021-08-25 DIAGNOSIS — N3000 Acute cystitis without hematuria: Secondary | ICD-10-CM

## 2021-08-25 DIAGNOSIS — I10 Essential (primary) hypertension: Secondary | ICD-10-CM

## 2021-08-25 LAB — POCT URINALYSIS DIP (MANUAL ENTRY)
Bilirubin, UA: NEGATIVE
Glucose, UA: NEGATIVE mg/dL
Ketones, POC UA: NEGATIVE mg/dL
Nitrite, UA: NEGATIVE
Protein Ur, POC: NEGATIVE mg/dL
Spec Grav, UA: 1.015 (ref 1.010–1.025)
Urobilinogen, UA: 0.2 E.U./dL
pH, UA: 5.5 (ref 5.0–8.0)

## 2021-08-25 LAB — POCT UA - MICROSCOPIC ONLY

## 2021-08-25 LAB — POCT GLYCOSYLATED HEMOGLOBIN (HGB A1C): HbA1c, POC (controlled diabetic range): 7.2 % — AB (ref 0.0–7.0)

## 2021-08-25 MED ORDER — CEPHALEXIN 500 MG PO CAPS: 500.0000 mg | ORAL_CAPSULE | Freq: Three times a day (TID) | ORAL | 0 refills | Status: AC

## 2021-08-25 NOTE — Patient Instructions (Addendum)
I am concerned about your falls so as we discussed, I have made referral to neurology to evaluate your dizziness.  I realize this has been chronic and intermittent but I wonder if there is some new component of it causing her to fall.  Additionally, I have also placed a referral for physical therapy.  I find a gait evaluation can you sleep be very helpful for this.  Let me know if you do not hear from these 2 providers within the next 7 to 10 days.  Please let me know if you have any additional falls in the near future, especially before we get this work-up done.  Please call the office with that information or send it to me via MyChart.  That information might indicate a need to do something different with the work-up. Additionally, I did notice that the last few times your blood pressures been a little lower.  I am hesitant to change her blood pressure medicine as it is being dosed by your cardiologist for your cardiac conditions but if you continue to have falls, I would consider cutting the At[place]and dose down.  I am getting some labs and I will send you a copy of those in the mail.  I will call you if there is anything strikingly abnormal.  If the vaginal discharge comes back, please let me know.  Have a happy holiday season!

## 2021-08-26 DIAGNOSIS — R296 Repeated falls: Secondary | ICD-10-CM | POA: Insufficient documentation

## 2021-08-26 LAB — CBC
Hematocrit: 35.1 % (ref 34.0–46.6)
Hemoglobin: 11.4 g/dL (ref 11.1–15.9)
MCH: 28.6 pg (ref 26.6–33.0)
MCHC: 32.5 g/dL (ref 31.5–35.7)
MCV: 88 fL (ref 79–97)
Platelets: 245 10*3/uL (ref 150–450)
RBC: 3.98 x10E6/uL (ref 3.77–5.28)
RDW: 14.7 % (ref 11.7–15.4)
WBC: 7.9 10*3/uL (ref 3.4–10.8)

## 2021-08-26 LAB — COMPREHENSIVE METABOLIC PANEL
ALT: 44 IU/L — ABNORMAL HIGH (ref 0–32)
AST: 59 IU/L — ABNORMAL HIGH (ref 0–40)
Albumin/Globulin Ratio: 1.9 (ref 1.2–2.2)
Albumin: 4.5 g/dL (ref 3.8–4.8)
Alkaline Phosphatase: 63 IU/L (ref 44–121)
BUN/Creatinine Ratio: 21 (ref 12–28)
BUN: 42 mg/dL — ABNORMAL HIGH (ref 8–27)
Bilirubin Total: 0.4 mg/dL (ref 0.0–1.2)
CO2: 25 mmol/L (ref 20–29)
Calcium: 10 mg/dL (ref 8.7–10.3)
Chloride: 96 mmol/L (ref 96–106)
Creatinine, Ser: 1.99 mg/dL — ABNORMAL HIGH (ref 0.57–1.00)
Globulin, Total: 2.4 g/dL (ref 1.5–4.5)
Glucose: 141 mg/dL — ABNORMAL HIGH (ref 70–99)
Potassium: 3.9 mmol/L (ref 3.5–5.2)
Sodium: 140 mmol/L (ref 134–144)
Total Protein: 6.9 g/dL (ref 6.0–8.5)
eGFR: 27 mL/min/{1.73_m2} — ABNORMAL LOW (ref >59)

## 2021-08-26 LAB — LIPID PANEL
Chol/HDL Ratio: 4.2 ratio (ref 0.0–4.4)
Cholesterol, Total: 109 mg/dL (ref 100–199)
HDL: 26 mg/dL — ABNORMAL LOW (ref >39)
LDL Chol Calc (NIH): 32 mg/dL (ref 0–99)
Triglycerides: 352 mg/dL — ABNORMAL HIGH (ref 0–149)
VLDL Cholesterol Cal: 51 mg/dL — ABNORMAL HIGH (ref 5–40)

## 2021-08-26 LAB — TSH: TSH: 3.78 u[IU]/mL (ref 0.450–4.500)

## 2021-08-26 NOTE — Progress Notes (Signed)
° ° °  CHIEF COMPLAINT / HPI:  1. Initially made appt for several days of slightly odorous vaginal discharge that was clear. That has stopped. Had no itching and no blood with it. Painless 2. Has had 2 falls recently. One where she injured her left forearm--goi it wedged behind furniture. Had siginficant bruising but did not go to doctor. It is improving but still swollen and bruised. No pain with use. Second fall more recent. No tripping, just ended up on ground. Does not recall any preceeding sx such as dizziness etc but does not she continues to have dizzy spells and has tried to be careful about standing up too soon or starting to walk immediately after standing. This fall occurred some time after standing. She is worried. She has also had a few other near-falls, but those seem more assocaited with tripping or clumsy feet. 3. Slight burning on urination last 2 days. Maybe some increased frequency of urination. No blood in urine, no fever.   PERTINENT  PMH / PSH: I have reviewed the patients medications, allergies, past medical and surgical history, smoking status and updated in the EMR as appropriate.   OBJECTIVE:  BP (!) 108/56    Pulse 61    Ht 5\' 2"  (1.575 m)    Wt 229 lb 3.2 oz (104 kg)    LMP 10/10/2010    SpO2 100%    BMI 41.92 kg/m  Vital signs reviewed. GENERAL: Well-developed, well-nourished, no acute distress. CARDIOVASCULAR: Regular rate and rhythm no murmur gallop or rub LUNGS: Clear to auscultation bilaterally, no rales or wheeze. ABDOMEN: Soft positive bowel sounds NEURO: No gross focal neurological deficits. MSK: Movement of extremity x 4. Left forearm resolving ecchymoses. 1.5 cm diameter by 0,5 cm firm heamtoma on left forearm, non tender. No pain with forearm motion, forearm supination or forearm squeeze. PSYCH: AxOx4. Good eye contact.. No psychomotor retardation or agitation. Appropriate speech fluency and content. Asks and answers questions appropriately. Mood is  congruent.   ASSESSMENT / PLAN: Uti: cx pending. Abx given Vaginal d/c has resolved. Likely BV. Should it return, she will follow up.  HYPERTENSION, BENIGN SYSTEMIC Good control, maybe too godd Recheck 1 m (See note under falls below)  Falls frequently Referral to neurology and for gait eval by PT. She is running lower BP so could consider reduction of ARB, but that is rx by cardiology for her underlying heart issues so they likely want this dose. Should she have more falls before f/u and eval by consultants (where hopefully we find cause) I would consider lowering dose at least briefly.She is informed I NEED to know if more falls. F/u 1 m.   [name] MD

## 2021-08-26 NOTE — Assessment & Plan Note (Addendum)
Good control, maybe too godd Recheck 1 m (See note under falls below)

## 2021-08-27 NOTE — Assessment & Plan Note (Signed)
Referral to neurology and for gait eval by PT. She is running lower BP so could consider reduction of ARB, but that is rx by cardiology for her underlying heart issues so they likely want this dose. Should she have more falls before f/u and eval by consultants (where hopefully we find cause) I would consider lowering dose at least briefly.She is informed I NEED to know if more falls. F/u 1 m.

## 2021-08-30 ENCOUNTER — Encounter: Payer: Self-pay | Admitting: *Deleted

## 2021-09-09 ENCOUNTER — Telehealth: Payer: Self-pay | Admitting: Family Medicine

## 2021-09-09 ENCOUNTER — Other Ambulatory Visit: Payer: Self-pay | Admitting: Family Medicine

## 2021-09-09 DIAGNOSIS — I10 Essential (primary) hypertension: Secondary | ICD-10-CM

## 2021-09-09 NOTE — Telephone Encounter (Signed)
Left mssg re her Creatinine which was elevated on last lab. I want her to come for repeat lab and order is in [facility]

## 2021-09-14 ENCOUNTER — Encounter: Payer: Self-pay | Admitting: Family Medicine

## 2021-09-15 NOTE — Therapy (Signed)
OUTPATIENT PHYSICAL THERAPY EVALUATION   Patient Name: Pamela Thomas MRN: [id number] DOB:1955-12-19, 66 y.o., female Today's Date: 09/16/2021   PT End of Session - 09/16/21 1059     Visit Number 1    Number of Visits 8    Date for PT Re-Evaluation 11/11/21    Authorization Type BCBS    PT Start Time 1045    PT Stop Time 1130    PT Time Calculation (min) 45 min    Activity Tolerance Patient tolerated treatment well    Behavior During Therapy WFL for tasks assessed/performed             Past Medical History:  Diagnosis Date   ALLERGIC RHINITIS, SEASONAL    Anxiety    Back pain    BCC (basal cell carcinoma of skin) 2005   Nose ([place])   Constipation    Coronary artery disease    Depression    Diabetes mellitus without complication ([place])    DYSLIPIDEMIA    Dysrhythmia    palpitations-evaluated by Dr Johnsie Cancel 6/12/eccho 6/13   Glucose intolerance (impaired glucose tolerance)    History of blood transfusion    Hypertension    Joint pain    OBESITY, NOS    Osteoarthritis    rt knee   PLANTAR FASCIITIS, BILATERAL    Vertigo    Vitamin D deficiency    Past Surgical History:  Procedure Laterality Date   BREAST BIOPSY  05/27/03   left   CARDIAC CATHETERIZATION     CARPAL TUNNEL RELEASE  05/27/03   Right   CESAREAN SECTION  05/27/03   CORONARY ARTERY BYPASS GRAFT N/A 08/17/2018   Procedure: CORONARY ARTERY BYPASS GRAFTING (CABG) TIMES TWO: LIMA to LAD, SVG to RAMUS INTERMEDIATE)  USING LEFT INTERNAL MAMMARY ARTERY AND RIGHT GREATER SAPHENOUS VEIN HARVESTED ENDOSCOPICALLY.;  Surgeon: [name], MD;  Location: [place];  Service: Open Heart Surgery;  Laterality: N/A;   KNEE SURGERY     left- removal fatty tumor   ROTATOR CUFF REPAIR     Bilateral   TEE WITHOUT CARDIOVERSION N/A 08/17/2018   Procedure: TRANSESOPHAGEAL ECHOCARDIOGRAM (TEE);  Surgeon: [name], MD;  Location: [place];  Service: Open Heart Surgery;  Laterality: N/A;    TOTAL KNEE ARTHROPLASTY  10/10/2012   Procedure: TOTAL KNEE ARTHROPLASTY;  Surgeon: Gearlean Alf, MD;  Location: [facility];  Service: Orthopedics;  Laterality: Right;   Patient Active Problem List   Diagnosis Date Noted   Falls frequently 08/26/2021   Elevated blood uric acid level 04/28/2021   CKD (chronic kidney disease), stage II 02/03/2021   Class 3 severe obesity due to excess calories with body mass index (BMI) of 45.0 to 49.9 in adult [facility]) 10/15/2019   Nonrheumatic aortic valve stenosis 07/25/2019   Dysthymia 01/30/2019   S/P CABG x 1 10/15/2018   Carotid atherosclerosis, bilateral 10/15/2018   Coronary artery disease 08/17/2018   Visit for preventive health examination 07/29/2016   Right knee DJD, s/p TKR 06/21/2012   Family history of malignant melanoma 06/15/2011   Type 2 diabetes mellitus with hyperglycemia, without long-term current use of insulin ([place]) 12/09/2009   CARDIAC MURMUR 05/27/2009   Hyperlipidemia associated with type 2 diabetes mellitus ([place]) 05/21/2007   ALLERGIC RHINITIS, SEASONAL 01/08/2007   Obesity 11/09/2006   HYPERTENSION, BENIGN SYSTEMIC 11/09/2006    PCP: Dickie La, MD  REFERRING PROVIDER: Dickie La, MD  REFERRING DIAG: Falls frequently  THERAPY DIAG:  Repeated falls  Muscle weakness (generalized)  ONSET DATE: ongoing for years   SUBJECTIVE:  SUBJECTIVE STATEMENT: Patient reports she has had a few falls recently, she is not a regular faller. States that one was her own fault, but there was one where she doesn't have any idea what happened. Typically the falls are due to tripping over objects. Patient does report she gets lightheadedness when she stands from a lower position, and she has to be conscious about it to avoid falling.  PERTINENT HISTORY: Frequent falls, right TKA approx 9 years ago, DM with possible early neuropathy  PAIN:  Are you having pain? No VAS scale: 0/10 Pain location: N/A Pain orientation: N/A PAIN TYPE:  N/A Pain description: N/A Aggravating factors: N/A Relieving factors: N/A  PRECAUTIONS: Fall  WEIGHT BEARING RESTRICTIONS No  FALLS:  Has patient fallen in last 6 months? Yes, Number of falls: 3  LIVING ENVIRONMENT: Lives with: lives with their spouse Lives in: House/apartment Stairs: Yes; Internal: 10 steps; on right going up and halfway up on left and External: 3 steps; have 2 handles at door Has following equipment at home: None  OCCUPATION: Retired  PLOF: Independent  PATIENT GOALS: Improve balance and reduce falls   OBJECTIVE:  DIAGNOSTIC FINDINGS: N/A  PATIENT SURVEYS:  FOTO 53% functional status  COGNITION:  Overall cognitive status: Within functional limits for tasks assessed     SENSATION:  Light touch: Deficits patient reports right > left sensation deficits of feet (outside bottom)  LE MMT:  MMT Right 09/16/2021 Left 09/16/2021  Hip flexion 4 4  Hip extension 4- 4-  Hip abduction 4- 4-  Knee flexion 5 5  Knee extension 5 5  Ankle dorsiflexion 5 5  Ankle plantarflexion 4 4   FUNCTIONAL TESTS:  5 times sit to stand: 14 Timed up and go (TUG): 12 Berg Balance Scale: 41  BERG BALANCE TEST Sitting to Standing: 4.      Stands without using hands and stabilize independently Standing Unsupported: 4.      Stands safely for 2 minutes Sitting Unsupported: 4.     Sits for 2 minutes independently Standing to Sitting: 4.     Sits safely with minimal use of hands Transfers: 4.     Transfers safely with minor use of hands Standing with eyes closed: 3.     Stands 10 seconds with supervision Standing with feet together: 3.     Stands for 1 minute with supervision Reaching forward with outstretched arm: 3.     Reaches forward 5 inches Retrieving object from the floor: 4.      Able to pick up easily and safely Turning to look behind: 3.     Looks behind one side only, other side less weight shift Turning 360 degrees: 2.     Able to turn slowly, but safely Place  alternate foot on stool: 2.     4 steps without assistance/supervision Standing with one foot in front: 0.     Loses balance while standing/stepping Standing on one foot: 1.     Holds <3 seconds Total Score: 41/56  GAIT: Assistive device utilized: None Level of assistance: Complete Independence Comments: trendelenburg   TODAY'S TREATMENT: Sit to stand x 10 without UE support Standing hip abduction x 20 each Heel toe raise x 20 Romberg with head turns and nods x 10 each 1/2 tandem x 30 sec each  PATIENT EDUCATION:  Education details: Exam findings, POC, HEP Person educated: Patient Education method: Explanation, Demonstration, Tactile cues,  Verbal cues, and Handouts Education comprehension: verbalized understanding, returned demonstration, verbal cues required, tactile cues required, and needs further education  HOME EXERCISE PROGRAM: Access Code: [email or web address]   ASSESSMENT: CLINICAL IMPRESSION: Patient is a 66 y.o. female who was seen today for physical therapy evaluation and treatment for frequent falls and balance deficit. Objective impairments include Abnormal gait, decreased activity tolerance, decreased balance, decreased strength, and impaired sensation. These impairments are limiting patient from community activity. Personal factors including Past/current experiences and Time since onset of injury/illness/exacerbation are also affecting patient's functional outcome. Patient will benefit from skilled PT to address above impairments and improve overall function.  REHAB POTENTIAL: Good  CLINICAL DECISION MAKING: Stable/uncomplicated  EVALUATION COMPLEXITY: Low   GOALS: Goals reviewed with patient? Yes  SHORT TERM GOALS:  STG Name Target Date Goal status  1 Patient will be I with initial HEP in order to progress with therapy. Baseline:  10/14/2021 INITIAL  2 PT will review FOTO with patient by 3rd visit in order to understand expected progress and outcome with  therapy. Baseline:  10/14/2021 INITIAL   LONG TERM GOALS:   LTG Name Target Date Goal status  1 Patient will be I with final HEP to maintain progress from PT. Baseline: 11/11/2021 INITIAL  2 Patient will report >/= 63% status on FOTO to indicate improved functional ability. Baseline: 53% 11/11/2021 INITIAL  3 Patient will demonstrate 5xSTS in </= 12 seconds to indicate improve strength and reduced fall risk Baseline: 14 sec 11/11/2021 INITIAL  4 Patient will perform TUG in </= 9 seconds to indicate improved mobility and reduced fall risk Baseline: 12 sec 11/11/2021 INITIAL  5 Patient will achived >/= 49/56 on BERG balance to indicate improved balance reduced fall risk Baseline: 41/56 11/11/2021 INITIAL   PLAN: PT FREQUENCY: 1x/week  PT DURATION: 8 weeks  PLANNED INTERVENTIONS: Therapeutic exercises, Therapeutic activity, Neuro Muscular re-education, Balance training, Gait training, Patient/Family education, Joint mobilization, Stair training, Aquatic Therapy, Dry Needling, and Manual therapy  PLAN FOR NEXT SESSION: Review HEP and progress PRN, general LE strengthening, dynamic balance training   Hilda Blades, PT, DPT, LAT, ATC 09/16/21  2:45 PM Phone: [phone] Fax: [phone]

## 2021-09-16 ENCOUNTER — Ambulatory Visit: Payer: BC Managed Care – PPO | Attending: Family Medicine | Admitting: Physical Therapy

## 2021-09-16 ENCOUNTER — Encounter: Payer: Self-pay | Admitting: Physical Therapy

## 2021-09-16 ENCOUNTER — Other Ambulatory Visit: Payer: Self-pay

## 2021-09-16 DIAGNOSIS — R296 Repeated falls: Secondary | ICD-10-CM | POA: Diagnosis present

## 2021-09-16 DIAGNOSIS — R42 Dizziness and giddiness: Secondary | ICD-10-CM | POA: Insufficient documentation

## 2021-09-16 DIAGNOSIS — M6281 Muscle weakness (generalized): Secondary | ICD-10-CM | POA: Diagnosis present

## 2021-09-16 NOTE — Patient Instructions (Signed)
Access Code: [email or web address] URL: [email or web address] Date: 09/16/2021 Prepared by: Hilda Blades  Exercises Sit to Stand Without Arm Support - 1 x daily - 7 x weekly - 3 sets - 10 reps Standing Hip Abduction with Counter Support - 1 x daily - 7 x weekly - 2 sets - 20 reps Heel Toe Raises with Counter Support - 1 x daily - 7 x weekly - 2 sets - 20 reps Romberg Stance with Head Rotation - 2 x daily - 7 x weekly - 2 sets - 10 reps Romberg Stance with Head Nods - 2 x daily - 7 x weekly - 2 sets - 10 reps Standing Romberg to 1/2 Tandem Stance - 2 x daily - 7 x weekly - 2 sets - 30 seconds hold

## 2021-09-23 NOTE — Therapy (Signed)
OUTPATIENT PHYSICAL THERAPY TREATMENT NOTE   Patient Name: Pamela Thomas MRN: [id number] DOB:October 20, 1955, 66 y.o., female Today's Date: 09/24/2021  PCP: Dickie La, MD REFERRING PROVIDER: Dickie La, MD   PT End of Session - 09/24/21 1349     Visit Number 2    Number of Visits 8    Date for PT Re-Evaluation 11/11/21    Authorization Type BCBS    PT Start Time 1350    PT Stop Time 1430    PT Time Calculation (min) 40 min    Equipment Utilized During Treatment Gait belt    Activity Tolerance Patient tolerated treatment well    Behavior During Therapy WFL for tasks assessed/performed             Past Medical History:  Diagnosis Date   ALLERGIC RHINITIS, SEASONAL    Anxiety    Back pain    BCC (basal cell carcinoma of skin) 2005   Nose ([place])   Constipation    Coronary artery disease    Depression    Diabetes mellitus without complication ([place])    DYSLIPIDEMIA    Dysrhythmia    palpitations-evaluated by Dr Johnsie Cancel 6/12/eccho 6/13   Glucose intolerance (impaired glucose tolerance)    History of blood transfusion    Hypertension    Joint pain    OBESITY, NOS    Osteoarthritis    rt knee   PLANTAR FASCIITIS, BILATERAL    Vertigo    Vitamin D deficiency    Past Surgical History:  Procedure Laterality Date   BREAST BIOPSY  05/27/03   left   CARDIAC CATHETERIZATION     CARPAL TUNNEL RELEASE  05/27/03   Right   CESAREAN SECTION  05/27/03   CORONARY ARTERY BYPASS GRAFT N/A 08/17/2018   Procedure: CORONARY ARTERY BYPASS GRAFTING (CABG) TIMES TWO: LIMA to LAD, SVG to RAMUS INTERMEDIATE)  USING LEFT INTERNAL MAMMARY ARTERY AND RIGHT GREATER SAPHENOUS VEIN HARVESTED ENDOSCOPICALLY.;  Surgeon: [name], MD;  Location: [place];  Service: Open Heart Surgery;  Laterality: N/A;   KNEE SURGERY     left- removal fatty tumor   ROTATOR CUFF REPAIR     Bilateral   TEE WITHOUT CARDIOVERSION N/A 08/17/2018   Procedure: TRANSESOPHAGEAL  ECHOCARDIOGRAM (TEE);  Surgeon: [name], MD;  Location: [place];  Service: Open Heart Surgery;  Laterality: N/A;   TOTAL KNEE ARTHROPLASTY  10/10/2012   Procedure: TOTAL KNEE ARTHROPLASTY;  Surgeon: Gearlean Alf, MD;  Location: [facility];  Service: Orthopedics;  Laterality: Right;   Patient Active Problem List   Diagnosis Date Noted   Falls frequently 08/26/2021   Elevated blood uric acid level 04/28/2021   CKD (chronic kidney disease), stage II 02/03/2021   Class 3 severe obesity due to excess calories with body mass index (BMI) of 45.0 to 49.9 in adult [facility]) 10/15/2019   Nonrheumatic aortic valve stenosis 07/25/2019   Dysthymia 01/30/2019   S/P CABG x 1 10/15/2018   Carotid atherosclerosis, bilateral 10/15/2018   Coronary artery disease 08/17/2018   Visit for preventive health examination 07/29/2016   Right knee DJD, s/p TKR 06/21/2012   Family history of malignant melanoma 06/15/2011   Type 2 diabetes mellitus with hyperglycemia, without long-term current use of insulin ([place]) 12/09/2009   CARDIAC MURMUR 05/27/2009   Hyperlipidemia associated with type 2 diabetes mellitus ([place]) 05/21/2007   ALLERGIC RHINITIS, SEASONAL 01/08/2007   Obesity 11/09/2006   HYPERTENSION, BENIGN SYSTEMIC 11/09/2006    REFERRING  DIAG: Falls frequently  THERAPY DIAG:  Repeated falls  Muscle weakness (generalized)  PERTINENT HISTORY: Frequent falls, right TKA approx 9 years ago, DM with possible early neuropathy  PRECAUTIONS: Fall  SUBJECTIVE: Pt reports no falls or pain since her initial visit. She reports that she forgot her HEP in the clinic, but has been doing her best from memory.  PAIN:  Are you having pain? No NPRS scale: 0/10 Pain location: N/A Pain orientation: N/A PAIN TYPE: N/A Pain description: N/A Aggravating factors: N/A Relieving factors: N/A     OBJECTIVE:  *Unless otherwise noted, objective findings collected previously*  DIAGNOSTIC FINDINGS: N/A   PATIENT  SURVEYS:  FOTO 53% functional status   COGNITION:          Overall cognitive status: Within functional limits for tasks assessed                        SENSATION:          Light touch: Deficits patient reports right > left sensation deficits of feet (outside bottom)   LE MMT:   MMT Right 09/16/2021 Left 09/16/2021  Hip flexion 4 4  Hip extension 4- 4-  Hip abduction 4- 4-  Knee flexion 5 5  Knee extension 5 5  Ankle dorsiflexion 5 5  Ankle plantarflexion 4 4    FUNCTIONAL TESTS:  5 times sit to stand: 14 Timed up and go (TUG): 12 Berg Balance Scale: 41   BERG BALANCE TEST Sitting to Standing: 4.      Stands without using hands and stabilize independently Standing Unsupported: 4.      Stands safely for 2 minutes Sitting Unsupported: 4.     Sits for 2 minutes independently Standing to Sitting: 4.     Sits safely with minimal use of hands Transfers: 4.     Transfers safely with minor use of hands Standing with eyes closed: 3.     Stands 10 seconds with supervision Standing with feet together: 3.     Stands for 1 minute with supervision Reaching forward with outstretched arm: 3.     Reaches forward 5 inches Retrieving object from the floor: 4.      Able to pick up easily and safely Turning to look behind: 3.     Looks behind one side only, other side less weight shift Turning 360 degrees: 2.     Able to turn slowly, but safely Place alternate foot on stool: 2.     4 steps without assistance/supervision Standing with one foot in front: 0.     Loses balance while standing/stepping Standing on one foot: 1.     Holds <3 seconds Total Score: 41/56   GAIT: Assistive device utilized: None Level of assistance: Complete Independence Comments: trendelenburg     TODAY'S TREATMENT:  [facility] Adult PT Treatment:                                                DATE: 09/24/2021 Therapeutic Exercise: Standing marching with slow leg lowering on Airex pad in // bars 3x20 Standing heel/toe  rocks on Airex pad in // bars 2x10 Mini squat side steps in // bars 3x2 laps Heel raises on Airex pad 3x10 in // bars Tandem stepping on half foam roll, flat down 2x20 BIL Traditional dead lift with two 6-pound  dumbbells in // bars 2x8 Manual Therapy: N/A Neuromuscular re-ed: N/A Therapeutic Activity: N/A Modalities: N/A Self Care: N/A   EVAL treatment: Sit to stand x 10 without UE support Standing hip abduction x 20 each Heel toe raise x 20 Romberg with head turns and nods x 10 each 1/2 tandem x 30 sec each   PATIENT EDUCATION:  Education details: Reinforced HEP Person educated: Patient Education method: Explanation, Demonstration, Tactile cues, Verbal cues, and Handouts Education comprehension: verbalized understanding, returned demonstration, verbal cues required, tactile cues required, and needs further education   HOME EXERCISE PROGRAM: Access Code: [email or web address]     ASSESSMENT: CLINICAL IMPRESSION: Pt responded well to all interventions today, demonstrating good form with verbal and tactile cuing with selected exercises. She reports that all exercises today presented a good challenge without feeling too difficulty. She continues to demonstrate moderate functional balance deficits as indicated by the need of regular UE support/ correction during balance exercises, as well as occasional PT use of gait belt for balance corrections. She will continue to benefit from skilled PT to address her primary impairments and return to her prior level of function with less limitation.   REHAB POTENTIAL: Good   CLINICAL DECISION MAKING: Stable/uncomplicated   EVALUATION COMPLEXITY: Low     GOALS: Goals reviewed with patient? Yes   SHORT TERM GOALS:   STG Name Target Date Goal status  1 Patient will be I with initial HEP in order to progress with therapy. Baseline:  10/14/2021 INITIAL  2 PT will review FOTO with patient by 3rd visit in order to understand expected progress and  outcome with therapy. Baseline:  10/14/2021 INITIAL    LONG TERM GOALS:    LTG Name Target Date Goal status  1 Patient will be I with final HEP to maintain progress from PT. Baseline: 11/11/2021 INITIAL  2 Patient will report >/= 63% status on FOTO to indicate improved functional ability. Baseline: 53% 11/11/2021 INITIAL  3 Patient will demonstrate 5xSTS in </= 12 seconds to indicate improve strength and reduced fall risk Baseline: 14 sec 11/11/2021 INITIAL  4 Patient will perform TUG in </= 9 seconds to indicate improved mobility and reduced fall risk Baseline: 12 sec 11/11/2021 INITIAL  5 Patient will achived >/= 49/56 on BERG balance to indicate improved balance reduced fall risk Baseline: 41/56 11/11/2021 INITIAL    PLAN: PT FREQUENCY: 1x/week   PT DURATION: 8 weeks   PLANNED INTERVENTIONS: Therapeutic exercises, Therapeutic activity, Neuro Muscular re-education, Balance training, Gait training, Patient/Family education, Joint mobilization, Stair training, Aquatic Therapy, Dry Needling, and Manual therapy   PLAN FOR NEXT SESSION: general LE strengthening, dynamic balance training    Vanessa Kimble, PT, DPT 09/24/21 2:28 PM

## 2021-09-24 ENCOUNTER — Other Ambulatory Visit: Payer: Self-pay

## 2021-09-24 ENCOUNTER — Ambulatory Visit: Payer: BC Managed Care – PPO

## 2021-09-24 DIAGNOSIS — M6281 Muscle weakness (generalized): Secondary | ICD-10-CM

## 2021-09-24 DIAGNOSIS — R296 Repeated falls: Secondary | ICD-10-CM | POA: Diagnosis not present

## 2021-09-28 NOTE — Therapy (Signed)
OUTPATIENT PHYSICAL THERAPY TREATMENT NOTE   Patient Name: Pamela Thomas MRN: [id number] DOB:1956/03/31, 66 y.o., female Today's Date: 09/29/2021  PCP: Dickie La, MD REFERRING PROVIDER: Dickie La, MD   PT End of Session - 09/29/21 1357     Visit Number 3    Number of Visits 8    Date for PT Re-Evaluation 11/11/21    Authorization Type BCBS    PT Start Time 1400    PT Stop Time 1440    PT Time Calculation (min) 40 min    Activity Tolerance Patient tolerated treatment well    Behavior During Therapy WFL for tasks assessed/performed              Past Medical History:  Diagnosis Date   ALLERGIC RHINITIS, SEASONAL    Anxiety    Back pain    BCC (basal cell carcinoma of skin) 2005   Nose ([place])   Constipation    Coronary artery disease    Depression    Diabetes mellitus without complication ([place])    DYSLIPIDEMIA    Dysrhythmia    palpitations-evaluated by Dr Johnsie Cancel 6/12/eccho 6/13   Glucose intolerance (impaired glucose tolerance)    History of blood transfusion    Hypertension    Joint pain    OBESITY, NOS    Osteoarthritis    rt knee   PLANTAR FASCIITIS, BILATERAL    Vertigo    Vitamin D deficiency    Past Surgical History:  Procedure Laterality Date   BREAST BIOPSY  05/27/03   left   CARDIAC CATHETERIZATION     CARPAL TUNNEL RELEASE  05/27/03   Right   CESAREAN SECTION  05/27/03   CORONARY ARTERY BYPASS GRAFT N/A 08/17/2018   Procedure: CORONARY ARTERY BYPASS GRAFTING (CABG) TIMES TWO: LIMA to LAD, SVG to RAMUS INTERMEDIATE)  USING LEFT INTERNAL MAMMARY ARTERY AND RIGHT GREATER SAPHENOUS VEIN HARVESTED ENDOSCOPICALLY.;  Surgeon: [name], MD;  Location: [place];  Service: Open Heart Surgery;  Laterality: N/A;   KNEE SURGERY     left- removal fatty tumor   ROTATOR CUFF REPAIR     Bilateral   TEE WITHOUT CARDIOVERSION N/A 08/17/2018   Procedure: TRANSESOPHAGEAL ECHOCARDIOGRAM (TEE);  Surgeon: [name], MD;   Location: [place];  Service: Open Heart Surgery;  Laterality: N/A;   TOTAL KNEE ARTHROPLASTY  10/10/2012   Procedure: TOTAL KNEE ARTHROPLASTY;  Surgeon: Gearlean Alf, MD;  Location: [facility];  Service: Orthopedics;  Laterality: Right;   Patient Active Problem List   Diagnosis Date Noted   Falls frequently 08/26/2021   Elevated blood uric acid level 04/28/2021   CKD (chronic kidney disease), stage II 02/03/2021   Class 3 severe obesity due to excess calories with body mass index (BMI) of 45.0 to 49.9 in adult [facility]) 10/15/2019   Nonrheumatic aortic valve stenosis 07/25/2019   Dysthymia 01/30/2019   S/P CABG x 1 10/15/2018   Carotid atherosclerosis, bilateral 10/15/2018   Coronary artery disease 08/17/2018   Visit for preventive health examination 07/29/2016   Right knee DJD, s/p TKR 06/21/2012   Family history of malignant melanoma 06/15/2011   Type 2 diabetes mellitus with hyperglycemia, without long-term current use of insulin ([place]) 12/09/2009   CARDIAC MURMUR 05/27/2009   Hyperlipidemia associated with type 2 diabetes mellitus ([place]) 05/21/2007   ALLERGIC RHINITIS, SEASONAL 01/08/2007   Obesity 11/09/2006   HYPERTENSION, BENIGN SYSTEMIC 11/09/2006    REFERRING DIAG: Falls frequently  THERAPY DIAG:  Repeated  falls  Muscle weakness (generalized)  PERTINENT HISTORY: Frequent falls, right TKA approx 9 years ago, DM with possible early neuropathy  PRECAUTIONS: Fall  SUBJECTIVE: Pt reports no falls or pain since her initial visit. She is doing well, exercises have been going pretty good at home since last visit.  PAIN:  Are you having pain? No NPRS scale: 0/10 Pain location: N/A Pain orientation: N/A PAIN TYPE: N/A Pain description: N/A Aggravating factors: N/A Relieving factors: N/A   PATIENT GOALS: Improve balance and reduce falls   OBJECTIVE: (BOLDED MEASURES ASSESSED THIS VISIT) PATIENT SURVEYS:  FOTO 53% functional status   LE MMT:   MMT Right 09/16/2021  Left 09/16/2021  Hip flexion 4 4  Hip extension 4- 4-  Hip abduction 4- 4-  Knee flexion 5 5  Knee extension 5 5  Ankle dorsiflexion 5 5  Ankle plantarflexion 4 4    FUNCTIONAL TESTS:  5 times sit to stand: 10 Timed up and go (TUG): 12 Berg Balance Scale: 41/56     TODAY'S TREATMENT: 09/29/2021 Therex: NuStep L6 x 5 min with UE/LE while taking subjective Sit to stand holding 10# at chest 2 x 10 Lateral step-up with 6" box 2 x 10 each Resisted retro walking with FM 13# 2 x 3 lengths Lateral stepping at counter with green at knees 2 x 20 Neuro re-ed: Romberg stance on Airex 3 x 30 sec with head turns Rockerboard forward/backward taps 2 x 30 sec Tandem stance 3 x 20 sec   09/24/2021 Standing marching with slow leg lowering on Airex pad in // bars 3x20 Standing heel/toe rocks on Airex pad in // bars 2x10 Mini squat side steps in // bars 3x2 laps Heel raises on Airex pad 3x10 in // bars Tandem stepping on half foam roll, flat down 2x20 BIL Traditional dead lift with two 6-pound dumbbells in // bars 2x8   09/16/2021 (eval) Sit to stand x 10 without UE support Standing hip abduction x 20 each Heel toe raise x 20 Romberg with head turns and nods x 10 each 1/2 tandem x 30 sec each   PATIENT EDUCATION:  Education details: HEP update Person educated: Patient Education method: Explanation, Demonstration, Tactile cues, Verbal cues, and Handouts Education comprehension: verbalized understanding, returned demonstration, verbal cues required, tactile cues required, and needs further education   HOME EXERCISE PROGRAM: Access Code: [email or web address]     ASSESSMENT: CLINICAL IMPRESSION: Patient tolerated therapy well with no adverse effects. Therapy focused on progress of hip and LE strengthening and balance training. Patient continues to demonstrate increased sway with dynamic balance but was able to progress to performing full tandem stance with occasional use of UE for support. Updated HEP  this visit to progress balance. She would benefit from continued skilled PT to progress strength and balance to reduce fall risk and maximize her functional ability.     GOALS: Goals reviewed with patient? Yes   SHORT TERM GOALS:   STG Name Target Date Goal status  1 Patient will be I with initial HEP in order to progress with therapy. Baseline:  10/14/2021 INITIAL  2 PT will review FOTO with patient by 3rd visit in order to understand expected progress and outcome with therapy. Baseline:  10/14/2021 INITIAL    LONG TERM GOALS:    LTG Name Target Date Goal status  1 Patient will be I with final HEP to maintain progress from PT. Baseline: 11/11/2021 INITIAL  2 Patient will report >/= 63% status on FOTO to indicate improved  functional ability. Baseline: 53% 11/11/2021 INITIAL  3 Patient will demonstrate 5xSTS in </= 12 seconds to indicate improve strength and reduced fall risk Baseline: 14 sec 11/11/2021 INITIAL  4 Patient will perform TUG in </= 9 seconds to indicate improved mobility and reduced fall risk Baseline: 12 sec 11/11/2021 INITIAL  5 Patient will achived >/= 49/56 on BERG balance to indicate improved balance reduced fall risk Baseline: 41/56 11/11/2021 INITIAL     PLAN: PT FREQUENCY: 1x/week   PT DURATION: 8 weeks   PLANNED INTERVENTIONS: Therapeutic exercises, Therapeutic activity, Neuro Muscular re-education, Balance training, Gait training, Patient/Family education, Joint mobilization, Stair training, Aquatic Therapy, Dry Needling, and Manual therapy   PLAN FOR NEXT SESSION: Review HEP and progress PRN, general LE strengthening, dynamic balance training   Hilda Blades, PT, DPT, LAT, ATC 09/29/21  2:46 PM Phone: [phone] Fax: [phone]

## 2021-09-29 ENCOUNTER — Encounter: Payer: Self-pay | Admitting: Physical Therapy

## 2021-09-29 ENCOUNTER — Other Ambulatory Visit: Payer: BC Managed Care – PPO

## 2021-09-29 ENCOUNTER — Other Ambulatory Visit: Payer: Self-pay

## 2021-09-29 ENCOUNTER — Ambulatory Visit: Payer: BC Managed Care – PPO | Admitting: Physical Therapy

## 2021-09-29 DIAGNOSIS — R296 Repeated falls: Secondary | ICD-10-CM

## 2021-09-29 DIAGNOSIS — M6281 Muscle weakness (generalized): Secondary | ICD-10-CM

## 2021-09-29 DIAGNOSIS — I10 Essential (primary) hypertension: Secondary | ICD-10-CM

## 2021-09-29 NOTE — Patient Instructions (Signed)
Access Code: [email or web address] URL: [email or web address] Date: 09/29/2021 Prepared by: Hilda Blades  Exercises Sit to Stand Without Arm Support - 1 x daily - 7 x weekly - 3 sets - 10 reps Standing Hip Abduction with Counter Support - 1 x daily - 7 x weekly - 2 sets - 20 reps Heel Toe Raises with Counter Support - 1 x daily - 7 x weekly - 2 sets - 20 reps Romberg Stance with Head Rotation - 2 x daily - 7 x weekly - 2 sets - 10 reps Romberg Stance with Head Nods - 2 x daily - 7 x weekly - 2 sets - 10 reps Standing Tandem Balance with Counter Support - 2 x daily - 7 x weekly - 3 reps - 20 seconds hold

## 2021-09-30 ENCOUNTER — Ambulatory Visit (HOSPITAL_COMMUNITY)
Admission: RE | Admit: 2021-09-30 | Discharge: 2021-09-30 | Disposition: A | Discharge status: Home / Self Care | Payer: BC Managed Care – PPO | Source: Ambulatory Visit | Attending: Internal Medicine | Admitting: Internal Medicine

## 2021-09-30 DIAGNOSIS — I6523 Occlusion and stenosis of bilateral carotid arteries: Secondary | ICD-10-CM | POA: Diagnosis present

## 2021-09-30 LAB — COMPREHENSIVE METABOLIC PANEL
ALT: 84 IU/L — ABNORMAL HIGH (ref 0–32)
AST: 85 IU/L — ABNORMAL HIGH (ref 0–40)
Albumin/Globulin Ratio: 2 (ref 1.2–2.2)
Albumin: 4.5 g/dL (ref 3.8–4.8)
Alkaline Phosphatase: 71 IU/L (ref 44–121)
BUN/Creatinine Ratio: 22 (ref 12–28)
BUN: 48 mg/dL — ABNORMAL HIGH (ref 8–27)
Bilirubin Total: 0.3 mg/dL (ref 0.0–1.2)
CO2: 24 mmol/L (ref 20–29)
Calcium: 10.2 mg/dL (ref 8.7–10.3)
Chloride: 98 mmol/L (ref 96–106)
Creatinine, Ser: 2.2 mg/dL — ABNORMAL HIGH (ref 0.57–1.00)
Globulin, Total: 2.2 g/dL (ref 1.5–4.5)
Glucose: 167 mg/dL — ABNORMAL HIGH (ref 70–99)
Potassium: 4 mmol/L (ref 3.5–5.2)
Sodium: 141 mmol/L (ref 134–144)
Total Protein: 6.7 g/dL (ref 6.0–8.5)
eGFR: 24 mL/min/{1.73_m2} — ABNORMAL LOW (ref >59)

## 2021-10-01 ENCOUNTER — Other Ambulatory Visit: Payer: Self-pay

## 2021-10-01 DIAGNOSIS — I6523 Occlusion and stenosis of bilateral carotid arteries: Secondary | ICD-10-CM

## 2021-10-04 ENCOUNTER — Telehealth: Payer: Self-pay | Admitting: Family Medicine

## 2021-10-04 DIAGNOSIS — N184 Chronic kidney disease, stage 4 (severe): Secondary | ICD-10-CM

## 2021-10-04 MED ORDER — METFORMIN HCL 1000 MG PO TABS: 1000.0000 mg | ORAL_TABLET | Freq: Every day | ORAL | 3 refills | Status: DC

## 2021-10-04 MED ORDER — ALLOPURINOL 100 MG PO TABS: 50.0000 mg | ORAL_TABLET | ORAL | 2 refills | Status: DC

## 2021-10-04 NOTE — Telephone Encounter (Signed)
Left message on voicemail.  Kidney function remains quite elevated.  I need her to call me in the recent times when I can give her a call back so we can have a conversation.  Most likely we will have to refer her to nephrology.  Would also need to talk about decreasing the dose of metformin. Pamela Thomas

## 2021-10-04 NOTE — Telephone Encounter (Signed)
Patient returns call to nurse line. Patient reports "now" is a good time for you to call her back.  [phone]

## 2021-10-04 NOTE — Telephone Encounter (Addendum)
Discussed drop in renal function with patient Precipitous drop (GFR 52 in July and now 24 (CKD 4). Have put in urgent referral to nephrology Decrease metformin dosage to 1 po qd. This is still more than I would start but as it is a continuing med, will decrease by 50% until she sees renal. Will decrease allopurinol to 50 mg QOD (she is not sure it is a pill she can cut and if not will do one every 4 days)  Will leave other meds. Rosuvastatin mainly, unchanged (as she has significant ASCVD) until she is seen by renal. She is currently on 40 rosuvastatin and by GFR will likely need to decrease.

## 2021-10-04 NOTE — Addendum Note (Signed)
Addended byDorcas Mcmurray L on: 10/04/2021 05:02 PM   Modules accepted: Orders

## 2021-10-05 NOTE — Therapy (Signed)
OUTPATIENT PHYSICAL THERAPY TREATMENT NOTE   Patient Name: Pamela Thomas MRN: [id number] DOB:1956/03/01, 66 y.o., female Today's Date: 10/06/2021  PCP: Dickie La, MD REFERRING PROVIDER: Dickie La, MD   PT End of Session - 10/06/21 1402     Visit Number 4    Number of Visits 8    Date for PT Re-Evaluation 11/11/21    Authorization Type BCBS    PT Start Time 1400    PT Stop Time 9485    PT Time Calculation (min) 45 min    Equipment Utilized During Treatment Gait belt    Activity Tolerance Patient tolerated treatment well    Behavior During Therapy WFL for tasks assessed/performed               Past Medical History:  Diagnosis Date   ALLERGIC RHINITIS, SEASONAL    Anxiety    Back pain    BCC (basal cell carcinoma of skin) 2005   Nose ([place])   Constipation    Coronary artery disease    Depression    Diabetes mellitus without complication ([place])    DYSLIPIDEMIA    Dysrhythmia    palpitations-evaluated by Dr Johnsie Cancel 6/12/eccho 6/13   Glucose intolerance (impaired glucose tolerance)    History of blood transfusion    Hypertension    Joint pain    OBESITY, NOS    Osteoarthritis    rt knee   PLANTAR FASCIITIS, BILATERAL    Vertigo    Vitamin D deficiency    Past Surgical History:  Procedure Laterality Date   BREAST BIOPSY  05/27/03   left   CARDIAC CATHETERIZATION     CARPAL TUNNEL RELEASE  05/27/03   Right   CESAREAN SECTION  05/27/03   CORONARY ARTERY BYPASS GRAFT N/A 08/17/2018   Procedure: CORONARY ARTERY BYPASS GRAFTING (CABG) TIMES TWO: LIMA to LAD, SVG to RAMUS INTERMEDIATE)  USING LEFT INTERNAL MAMMARY ARTERY AND RIGHT GREATER SAPHENOUS VEIN HARVESTED ENDOSCOPICALLY.;  Surgeon: [name], MD;  Location: [place];  Service: Open Heart Surgery;  Laterality: N/A;   KNEE SURGERY     left- removal fatty tumor   ROTATOR CUFF REPAIR     Bilateral   TEE WITHOUT CARDIOVERSION N/A 08/17/2018   Procedure: TRANSESOPHAGEAL  ECHOCARDIOGRAM (TEE);  Surgeon: [name], MD;  Location: [place];  Service: Open Heart Surgery;  Laterality: N/A;   TOTAL KNEE ARTHROPLASTY  10/10/2012   Procedure: TOTAL KNEE ARTHROPLASTY;  Surgeon: Gearlean Alf, MD;  Location: [facility];  Service: Orthopedics;  Laterality: Right;   Patient Active Problem List   Diagnosis Date Noted   Falls frequently 08/26/2021   Elevated blood uric acid level 04/28/2021   CKD (chronic kidney disease), stage II 02/03/2021   Class 3 severe obesity due to excess calories with body mass index (BMI) of 45.0 to 49.9 in adult [facility]) 10/15/2019   Nonrheumatic aortic valve stenosis 07/25/2019   Dysthymia 01/30/2019   S/P CABG x 1 10/15/2018   Carotid atherosclerosis, bilateral 10/15/2018   Coronary artery disease 08/17/2018   Visit for preventive health examination 07/29/2016   Right knee DJD, s/p TKR 06/21/2012   Family history of malignant melanoma 06/15/2011   Type 2 diabetes mellitus with hyperglycemia, without long-term current use of insulin ([place]) 12/09/2009   CARDIAC MURMUR 05/27/2009   Hyperlipidemia associated with type 2 diabetes mellitus ([place]) 05/21/2007   ALLERGIC RHINITIS, SEASONAL 01/08/2007   Obesity 11/09/2006   HYPERTENSION, BENIGN SYSTEMIC 11/09/2006  REFERRING DIAG: Falls frequently  THERAPY DIAG:  Repeated falls  Muscle weakness (generalized)  PERTINENT HISTORY: Frequent falls, right TKA approx 9 years ago, DM with possible early neuropathy  PRECAUTIONS: Fall  SUBJECTIVE: Pt reports no pain today, adding that she has felt stronger and with better balance since starting PT. She states she has been doing her HEP daily.  PAIN:  Are you having pain? No NPRS scale: 0/10 Pain location: N/A Pain orientation: N/A PAIN TYPE: N/A Pain description: N/A Aggravating factors: N/A Relieving factors: N/A   PATIENT GOALS: Improve balance and reduce falls   OBJECTIVE: (BOLDED MEASURES ASSESSED THIS VISIT) PATIENT SURVEYS:   FOTO 53% functional status   LE MMT:   MMT Right 09/16/2021 Left 09/16/2021 Right 10/06/2021 Left 10/06/2021  Hip flexion 4 4 4+/5 4+/5  Hip extension 4- 4- 4+/5 4+/5  Hip abduction 4- 4- 4-/5 4-/5  Knee flexion 5 5    Knee extension 5 5    Ankle dorsiflexion 5 5    Ankle plantarflexion 4 4      FUNCTIONAL TESTS:  5 times sit to stand: 10 Timed up and go (TUG): 12 Berg Balance Scale: 41/56     TODAY'S TREATMENT:  [facility] Adult PT Treatment:                                                DATE: 10/05/2021 Therapeutic Exercise: NuStep level 6 x5 minutes while collecting subjective information 10# kettlebell swings 3x10 in // bars Heel raises in // bars 3x10 Standing calf stretch on slant board 2x30sec Mini-squat side steps in // bars with GTB above knees 3x2 laps Manual Therapy: N/A Neuromuscular re-ed: Standing marching on Airex pad with slow LE lowering 2x20 in // bars Forward lunges on each side of Bosu ball 2x10 BIL each side in // bars Tandem forward/backward stepping on half foam roll, flat down 2x10 BIL Therapeutic Activity: N/A Modalities: N/A Self Care: N/A   09/29/2021 Therex: NuStep L6 x 5 min with UE/LE while taking subjective Sit to stand holding 10# at chest 2 x 10 Lateral step-up with 6" box 2 x 10 each Resisted retro walking with FM 13# 2 x 3 lengths Lateral stepping at counter with green at knees 2 x 20 Neuro re-ed: Romberg stance on Airex 3 x 30 sec with head turns Rockerboard forward/backward taps 2 x 30 sec Tandem stance 3 x 20 sec   09/24/2021 Standing marching with slow leg lowering on Airex pad in // bars 3x20 Standing heel/toe rocks on Airex pad in // bars 2x10 Mini squat side steps in // bars 3x2 laps Heel raises on Airex pad 3x10 in // bars Tandem stepping on half foam roll, flat down 2x20 BIL Traditional dead lift with two 6-pound dumbbells in // bars 2x8    PATIENT EDUCATION:  Education details: Discussed FOTO with pt Person  educated: Patient Education method: Explanation,  Education comprehension: verbalized understanding   HOME EXERCISE PROGRAM: Access Code: [email or web address]     ASSESSMENT: CLINICAL IMPRESSION: Pt responded well to all interventions today, demonstrating good form with all selected exercises. She continues to demonstrate improved balance with selected exercises, but maintains weak functional ankle strength as indicated by tendency of ankle to invert with Bosu lunges. Upon re-assessment of hip strength, the pt has made good progress with BIL hip extension and flexion strength. She  will continue to benefit from skilled PT to address her primary impairments and return to her prior level of function with less limitation.     GOALS: Goals reviewed with patient? Yes   SHORT TERM GOALS:   STG Name Target Date Goal status  1 Patient will be I with initial HEP in order to progress with therapy. Baseline: Achieved 10/06/2021 10/14/2021 Achieved  2 PT will review FOTO with patient by 3rd visit in order to understand expected progress and outcome with therapy. Baseline: Achieved 10/06/2021 10/14/2021 Achieved    LONG TERM GOALS:    LTG Name Target Date Goal status  1 Patient will be I with final HEP to maintain progress from PT. Baseline: 11/11/2021 INITIAL  2 Patient will report >/= 63% status on FOTO to indicate improved functional ability. Baseline: 53% 11/11/2021 INITIAL  3 Patient will demonstrate 5xSTS in </= 12 seconds to indicate improve strength and reduced fall risk Baseline: 14 sec 11/11/2021 INITIAL  4 Patient will perform TUG in </= 9 seconds to indicate improved mobility and reduced fall risk Baseline: 12 sec 11/11/2021 INITIAL  5 Patient will achived >/= 49/56 on BERG balance to indicate improved balance reduced fall risk Baseline: 41/56 11/11/2021 INITIAL     PLAN: PT FREQUENCY: 1x/week   PT DURATION: 8 weeks   PLANNED INTERVENTIONS: Therapeutic exercises, Therapeutic activity, Neuro Muscular  re-education, Balance training, Gait training, Patient/Family education, Joint mobilization, Stair training, Aquatic Therapy, Dry Needling, and Manual therapy   PLAN FOR NEXT SESSION: Review HEP and progress PRN, general LE strengthening, dynamic balance training   Vanessa Mascotte, PT, DPT 10/06/21 2:42 PM

## 2021-10-05 NOTE — Progress Notes (Signed)
Nephrology referral made and adjusted existed meds based on GFR

## 2021-10-06 ENCOUNTER — Ambulatory Visit: Payer: BC Managed Care – PPO

## 2021-10-06 ENCOUNTER — Other Ambulatory Visit: Payer: Self-pay

## 2021-10-06 DIAGNOSIS — R296 Repeated falls: Secondary | ICD-10-CM

## 2021-10-06 DIAGNOSIS — M6281 Muscle weakness (generalized): Secondary | ICD-10-CM

## 2021-10-08 ENCOUNTER — Ambulatory Visit: Payer: BC Managed Care – PPO | Admitting: Neurology

## 2021-10-09 ENCOUNTER — Encounter: Payer: Self-pay | Admitting: Family Medicine

## 2021-10-10 ENCOUNTER — Other Ambulatory Visit: Payer: Self-pay | Admitting: Family Medicine

## 2021-10-12 MED ORDER — ALLOPURINOL 100 MG PO TABS: 50.0000 mg | ORAL_TABLET | ORAL | 2 refills | Status: DC

## 2021-10-13 ENCOUNTER — Ambulatory Visit: Payer: BC Managed Care – PPO | Attending: Family Medicine | Admitting: Physical Therapy

## 2021-10-13 ENCOUNTER — Other Ambulatory Visit: Payer: Self-pay

## 2021-10-13 ENCOUNTER — Encounter: Payer: Self-pay | Admitting: Physical Therapy

## 2021-10-13 DIAGNOSIS — R296 Repeated falls: Secondary | ICD-10-CM | POA: Insufficient documentation

## 2021-10-13 DIAGNOSIS — M6281 Muscle weakness (generalized): Secondary | ICD-10-CM | POA: Diagnosis present

## 2021-10-13 NOTE — Therapy (Signed)
OUTPATIENT PHYSICAL THERAPY TREATMENT NOTE   Patient Name: Pamela Thomas MRN: [id number] DOB:1956/06/03, 66 y.o., female Today's Date: 10/13/2021  PCP: Dickie La, MD REFERRING PROVIDER: Dickie La, MD   PT End of Session - 10/13/21 1415     Visit Number 5    Number of Visits 8    Date for PT Re-Evaluation 11/11/21    Authorization Type BCBS    PT Start Time 1405    PT Stop Time 5284    PT Time Calculation (min) 40 min    Activity Tolerance Patient tolerated treatment well    Behavior During Therapy WFL for tasks assessed/performed                Past Medical History:  Diagnosis Date   ALLERGIC RHINITIS, SEASONAL    Anxiety    Back pain    BCC (basal cell carcinoma of skin) 2005   Nose ([place])   Constipation    Coronary artery disease    Depression    Diabetes mellitus without complication ([place])    DYSLIPIDEMIA    Dysrhythmia    palpitations-evaluated by Dr Johnsie Cancel 6/12/eccho 6/13   Glucose intolerance (impaired glucose tolerance)    History of blood transfusion    Hypertension    Joint pain    OBESITY, NOS    Osteoarthritis    rt knee   PLANTAR FASCIITIS, BILATERAL    Vertigo    Vitamin D deficiency    Past Surgical History:  Procedure Laterality Date   BREAST BIOPSY  05/27/03   left   CARDIAC CATHETERIZATION     CARPAL TUNNEL RELEASE  05/27/03   Right   CESAREAN SECTION  05/27/03   CORONARY ARTERY BYPASS GRAFT N/A 08/17/2018   Procedure: CORONARY ARTERY BYPASS GRAFTING (CABG) TIMES TWO: LIMA to LAD, SVG to RAMUS INTERMEDIATE)  USING LEFT INTERNAL MAMMARY ARTERY AND RIGHT GREATER SAPHENOUS VEIN HARVESTED ENDOSCOPICALLY.;  Surgeon: [name], MD;  Location: [place] [facility];  Service: Open Heart Surgery;  Laterality: N/A;   KNEE SURGERY     left- removal fatty tumor   ROTATOR CUFF REPAIR     Bilateral   TEE WITHOUT CARDIOVERSION N/A 08/17/2018   Procedure: TRANSESOPHAGEAL ECHOCARDIOGRAM (TEE);  Surgeon: [name], MD;   Location: [place];  Service: Open Heart Surgery;  Laterality: N/A;   TOTAL KNEE ARTHROPLASTY  10/10/2012   Procedure: TOTAL KNEE ARTHROPLASTY;  Surgeon: Gearlean Alf, MD;  Location: [facility];  Service: Orthopedics;  Laterality: Right;   Patient Active Problem List   Diagnosis Date Noted   Falls frequently 08/26/2021   Elevated blood uric acid level 04/28/2021   CKD (chronic kidney disease), stage II 02/03/2021   Class 3 severe obesity due to excess calories with body mass index (BMI) of 45.0 to 49.9 in adult [facility]) 10/15/2019   Nonrheumatic aortic valve stenosis 07/25/2019   Dysthymia 01/30/2019   S/P CABG x 1 10/15/2018   Carotid atherosclerosis, bilateral 10/15/2018   Coronary artery disease 08/17/2018   Visit for preventive health examination 07/29/2016   Right knee DJD, s/p TKR 06/21/2012   Family history of malignant melanoma 06/15/2011   Type 2 diabetes mellitus with hyperglycemia, without long-term current use of insulin ([place]) 12/09/2009   CARDIAC MURMUR 05/27/2009   Hyperlipidemia associated with type 2 diabetes mellitus ([place]) 05/21/2007   ALLERGIC RHINITIS, SEASONAL 01/08/2007   Obesity 11/09/2006   HYPERTENSION, BENIGN SYSTEMIC 11/09/2006    REFERRING DIAG: Falls frequently  THERAPY DIAG:  Repeated falls  Muscle weakness (generalized)  PERTINENT HISTORY: Frequent falls, right TKA approx 9 years ago, DM with possible early neuropathy  PRECAUTIONS: Fall  SUBJECTIVE: She feels she has noticed improvement in her balance.  PAIN:  Are you having pain? No NPRS scale: 0/10 Pain location: N/A Pain orientation: N/A PAIN TYPE: N/A Pain description: N/A Aggravating factors: N/A Relieving factors: N/A   PATIENT GOALS: Improve balance and reduce falls   OBJECTIVE: (BOLDED MEASURES ASSESSED THIS VISIT) PATIENT SURVEYS:  FOTO 55% functional status - 10/13/2021   LE MMT:   MMT Right 09/16/2021 Left 09/16/2021 Right 10/06/2021 Left 10/06/2021  Hip flexion 4 4 4+/5 4+/5   Hip extension 4- 4- 4+/5 4+/5  Hip abduction 4- 4- 4-/5 4-/5  Knee flexion 5 5    Knee extension 5 5    Ankle dorsiflexion 5 5    Ankle plantarflexion 4 4      FUNCTIONAL TESTS:  5 times sit to stand: 10 - 10/13/2021 Timed up and go (TUG): 12 Berg Balance Scale: 41/56     TODAY'S TREATMENT: 10/13/2021: NuStep L7 x 5 min with LE while taking subjective Sit to stand holding 10# at chest LAQ with 4# ankle weights 2 x 20 each Heel toe raises 2 x 20 Neuro re-ed: Romberg on Airex with head turns and nods 2 x 10 each Rockerboard forward/backward taps 2 x 20 Tandem stance 2 x 30 sec eachbullard, c  10/05/2021 Therapeutic Exercise: NuStep level 6 x5 minutes while collecting subjective information 10# kettlebell swings 3x10 in // bars Heel raises in // bars 3x10 Standing calf stretch on slant board 2x30sec Mini-squat side steps in // bars with GTB above knees 3x2 laps Neuromuscular re-ed: Standing marching on Airex pad with slow LE lowering 2x20 in // bars Forward lunges on each side of Bosu ball 2x10 BIL each side in // bars Tandem forward/backward stepping on half foam roll, flat down 2x10 BIL  09/29/2021 Therex: NuStep L6 x 5 min with UE/LE while taking subjective Sit to stand holding 10# at chest 2 x 10 Lateral step-up with 6" box 2 x 10 each Resisted retro walking with FM 13# 2 x 3 lengths Lateral stepping at counter with green at knees 2 x 20 Neuro re-ed: Romberg stance on Airex 3 x 30 sec with head turns Rockerboard forward/backward taps 2 x 30 sec Tandem stance 3 x 20 sec  09/24/2021 Standing marching with slow leg lowering on Airex pad in // bars 3x20 Standing heel/toe rocks on Airex pad in // bars 2x10 Mini squat side steps in // bars 3x2 laps Heel raises on Airex pad 3x10 in // bars Tandem stepping on half foam roll, flat down 2x20 BIL Traditional dead lift with two 6-pound dumbbells in // bars 2x8  PATIENT EDUCATION:  Education details: HEP, FOTO Person  educated: Patient Education method: Explanation,  Education comprehension: verbalized understanding   HOME EXERCISE PROGRAM: Access Code: [email or web address]     ASSESSMENT: CLINICAL IMPRESSION: Patient tolerated therapy well with no adverse effects. She reports improvement in her functional level on FOTO and demonstrates improved 5xSTS compared to evaluation. Therapy continues to focus on dynamic balance and LE strengthening. Patient demonstrates increased sway with unstable foam surface and requires frequent UE support to maintain balance. She does report feeling stronger and more balance with daily mobility. No changes made to HEP this visit. She would benefit from continued skilled PT to progress strength and balance to reduce fall risk and maximize her functional ability.  GOALS: Goals reviewed with patient? Yes   SHORT TERM GOALS:   STG Name Target Date Goal status  1 Patient will be I with initial HEP in order to progress with therapy. Baseline: Achieved 10/06/2021 10/14/2021 Achieved  2 PT will review FOTO with patient by 3rd visit in order to understand expected progress and outcome with therapy. Baseline: Achieved 10/06/2021 10/14/2021 Achieved    LONG TERM GOALS:    LTG Name Target Date Goal status  1 Patient will be I with final HEP to maintain progress from PT. Baseline: 11/11/2021 INITIAL  2 Patient will report >/= 63% status on FOTO to indicate improved functional ability. Baseline: 53% 11/11/2021 INITIAL  3 Patient will demonstrate 5xSTS in </= 12 seconds to indicate improve strength and reduced fall risk Baseline: 14 sec 11/11/2021 INITIAL  4 Patient will perform TUG in </= 9 seconds to indicate improved mobility and reduced fall risk Baseline: 12 sec 11/11/2021 INITIAL  5 Patient will achived >/= 49/56 on BERG balance to indicate improved balance reduced fall risk Baseline: 41/56 11/11/2021 INITIAL     PLAN: PT FREQUENCY: 1x/week   PT DURATION: 8 weeks   PLANNED INTERVENTIONS:  Therapeutic exercises, Therapeutic activity, Neuro Muscular re-education, Balance training, Gait training, Patient/Family education, Joint mobilization, Stair training, Aquatic Therapy, Dry Needling, and Manual therapy   PLAN FOR NEXT SESSION: Review HEP and progress PRN, general LE strengthening, dynamic balance training   Hilda Blades, PT, DPT, LAT, ATC 10/13/21  3:34 PM Phone: [phone] Fax: [phone]

## 2021-10-21 NOTE — Therapy (Addendum)
OUTPATIENT PHYSICAL THERAPY TREATMENT NOTE/ DISCHARGE SUMMARY   Patient Name: Pamela Thomas MRN: [id number] DOB:1955-12-19, 66 y.o., female Today's Date: 10/22/2021  PCP: Dickie La, MD REFERRING PROVIDER: Dickie La, MD   PT End of Session - 10/22/21 1215     Visit Number 6    Number of Visits 8    Date for PT Re-Evaluation 11/11/21    Authorization Type BCBS    PT Start Time 1220    PT Stop Time 5009    PT Time Calculation (min) 45 min    Equipment Utilized During Treatment Gait belt    Activity Tolerance Patient tolerated treatment well    Behavior During Therapy WFL for tasks assessed/performed                 Past Medical History:  Diagnosis Date   ALLERGIC RHINITIS, SEASONAL    Anxiety    Back pain    BCC (basal cell carcinoma of skin) 2005   Nose ([place])   Constipation    Coronary artery disease    Depression    Diabetes mellitus without complication ([place])    DYSLIPIDEMIA    Dysrhythmia    palpitations-evaluated by Dr Johnsie Cancel 6/12/eccho 6/13   Glucose intolerance (impaired glucose tolerance)    History of blood transfusion    Hypertension    Joint pain    OBESITY, NOS    Osteoarthritis    rt knee   PLANTAR FASCIITIS, BILATERAL    Vertigo    Vitamin D deficiency    Past Surgical History:  Procedure Laterality Date   BREAST BIOPSY  05/27/03   left   CARDIAC CATHETERIZATION     CARPAL TUNNEL RELEASE  05/27/03   Right   CESAREAN SECTION  05/27/03   CORONARY ARTERY BYPASS GRAFT N/A 08/17/2018   Procedure: CORONARY ARTERY BYPASS GRAFTING (CABG) TIMES TWO: LIMA to LAD, SVG to RAMUS INTERMEDIATE)  USING LEFT INTERNAL MAMMARY ARTERY AND RIGHT GREATER SAPHENOUS VEIN HARVESTED ENDOSCOPICALLY.;  Surgeon: [name], MD;  Location: [place];  Service: Open Heart Surgery;  Laterality: N/A;   KNEE SURGERY     left- removal fatty tumor   ROTATOR CUFF REPAIR     Bilateral   TEE WITHOUT CARDIOVERSION N/A 08/17/2018   Procedure:  TRANSESOPHAGEAL ECHOCARDIOGRAM (TEE);  Surgeon: [name], MD;  Location: [place];  Service: Open Heart Surgery;  Laterality: N/A;   TOTAL KNEE ARTHROPLASTY  10/10/2012   Procedure: TOTAL KNEE ARTHROPLASTY;  Surgeon: Gearlean Alf, MD;  [facility];  Service: Orthopedics;  Laterality: Right;   Patient Active Problem List   Diagnosis Date Noted   Falls frequently 08/26/2021   Elevated blood uric acid level 04/28/2021   CKD (chronic kidney disease), stage II 02/03/2021   Class 3 severe obesity due to excess calories with body mass index (BMI) of 45.0 to 49.9 in adult [facility]) 10/15/2019   Nonrheumatic aortic valve stenosis 07/25/2019   Dysthymia 01/30/2019   S/P CABG x 1 10/15/2018   Carotid atherosclerosis, bilateral 10/15/2018   Coronary artery disease 08/17/2018   Visit for preventive health examination 07/29/2016   Right knee DJD, s/p TKR 06/21/2012   Family history of malignant melanoma 06/15/2011   Type 2 diabetes mellitus with hyperglycemia, without long-term current use of insulin ([place]) 12/09/2009   CARDIAC MURMUR 05/27/2009   Hyperlipidemia associated with type 2 diabetes mellitus ([place]) 05/21/2007   ALLERGIC RHINITIS, SEASONAL 01/08/2007   Obesity 11/09/2006   HYPERTENSION, BENIGN  SYSTEMIC 11/09/2006    REFERRING DIAG: Falls frequently  THERAPY DIAG:  Repeated falls  Muscle weakness (generalized)  PERTINENT HISTORY: Frequent falls, right TKA approx 9 years ago, DM with possible early neuropathy  PRECAUTIONS: Fall  SUBJECTIVE: Pt reports feeling fatigued today, although this is not associated with any change in activity of home exercises. She adds that she has continued to be adherent to her HEP. She reports that she has been feeling much better balanced when performing her ADLs, however, she is still not confident with stair negotiation/ community ambulation. She requests to put therapy on hold for the next two weeks due to her daughter giving birth next week,  but would like to continue PT to address these deficits.  PAIN:  Are you having pain? No NPRS scale: 0/10 Pain location: N/A Pain orientation: N/A PAIN TYPE: N/A Pain description: N/A Aggravating factors: N/A Relieving factors: N/A   PATIENT GOALS: Improve balance and reduce falls   OBJECTIVE: (BOLDED MEASURES ASSESSED THIS VISIT) PATIENT SURVEYS:  FOTO 55% functional status - 10/13/2021   LE MMT:   MMT Right 09/16/2021 Left 09/16/2021 Right 10/06/2021 Left 10/06/2021  Hip flexion 4 4 4+/5 4+/5  Hip extension 4- 4- 4+/5 4+/5  Hip abduction 4- 4- 4-/5 4-/5  Knee flexion 5 5    Knee extension 5 5    Ankle dorsiflexion 5 5    Ankle plantarflexion 4 4      FUNCTIONAL TESTS:  5 times sit to stand: 10 seconds on 10/13/2021; 11 seconds on 10/22/2021 Timed up and go (TUG): 12 seconds; 10/22/2021: 9 seconds Berg Balance Scale: 41/56  10/22/2021: BERG BALANCE TEST Sitting to Standing: 4.      Stands without using hands and stabilize independently Standing Unsupported: 4.      Stands safely for 2 minutes Sitting Unsupported: 4.     Sits for 2 minutes independently Standing to Sitting: 4.     Sits safely with minimal use of hands Transfers: 4.     Transfers safely with minor use of hands Standing with eyes closed: 4.     Stands safely for 10 seconds  Standing with feet together: 4.     Stands for 1 minute safely Reaching forward with outstretched arm: 4.     Reaches forward 10 inches Retrieving object from the floor: 4.      Able to pick up easily and safely Turning to look behind: 4.     Looks behind from both sides and weight shifts well Turning 360 degrees: 4.     Able to turn in </=4 seconds  Place alternate foot on stool: 4.     Completes 8 steps in 20 seconds     Standing with one foot in front: 4.     Independent tandem for 30 seconds  Standing on one foot: 2.     Holds >/=3 seconds  Total Score: 54/56   10/22/2021: 2MWT: 24f (0.95 m/s)  10/22/2021: Stair negotiation: Unable  to step independently onto 6-inch step without UE support     [facility]  [facility]                                                [facility]: 10/22/2021 Therapeutic Exercise: N/A Manual Therapy: N/A Neuromuscular re-ed: Step-up to Airex pad with knee drive in // bars with  UE support as needed 2x10 BIL Standing weight shifting fwd/backward and side-to-side 2x10 each Therapeutic Activity: Forward step-up to 6-inch step in //bars with UE support 2x10 BIL 2 min walk test 5xSTS BERG assessment TUG Education on objective data, progress in PT, and POC moving forward Modalities: N/A Self Care: N/A   10/13/2021: NuStep L7 x 5 min with LE while taking subjective Sit to stand holding 10# at chest LAQ with 4# ankle weights 2 x 20 each Heel toe raises 2 x 20 Neuro re-ed: Romberg on Airex with head turns and nods 2 x 10 each Rockerboard forward/backward taps 2 x 20 Tandem stance 2 x 30 sec eachbullard, c  10/05/2021 Therapeutic Exercise: NuStep level 6 x5 minutes while collecting subjective information 10# kettlebell swings 3x10 in // bars Heel raises in // bars 3x10 Standing calf stretch on slant board 2x30sec Mini-squat side steps in // bars with GTB above knees 3x2 laps Neuromuscular re-ed: Standing marching on Airex pad with slow LE lowering 2x20 in // bars Forward lunges on each side of Bosu ball 2x10 BIL each side in // bars Tandem forward/backward stepping on half foam roll, flat down 2x10 BIL   PATIENT EDUCATION:  Education details: HEP, FOTO Person educated: Patient Education method: Explanation,  Education comprehension: verbalized understanding   HOME EXERCISE PROGRAM: Access Code: [email or web address]  Exercises Sit to Stand Without Arm Support - 1 x daily - 7 x weekly - 3 sets - 10 reps Standing Hip Abduction with Counter Support - 1 x daily - 7 x weekly - 2 sets - 20 reps Heel Toe Raises with Counter Support - 1 x daily - 7 x weekly - 2 sets - 20  reps Romberg Stance with Head Rotation - 2 x daily - 7 x weekly - 2 sets - 10 reps Romberg Stance with Head Nods - 2 x daily - 7 x weekly - 2 sets - 10 reps Standing Tandem Balance with Counter Support - 2 x daily - 7 x weekly - 3 reps - 20 seconds hold  Added 10/22/2021 Single Leg Balance Clock WITH UPPER EXTREMITY SUPPORT - 1 x daily - 7 x weekly - 3 sets - 10 reps      ASSESSMENT: CLINICAL IMPRESSION: Upon re-assessing the pt's functional rehab goals, she has met all of her goals except her FOTO goal. Her BERG, 5xSTS, and TUG testing measures have all seen excellent improvements meeting MCID. However, the pt reports still having difficulty with negotiating stairs, which was confirmed with testing. She also continues to have decreased gait speed when compared to age-related norms. The pt requests to have PT on hold for the next two weeks due to her daughter delivering a baby next week. She reports that she would like to continue PT after this time to continue targeting her remaining deficits. She will continue to benefit from skilled PT to address her primary impairments and return to her prior level of function with less limitation.     GOALS: Goals reviewed with patient? Yes   SHORT TERM GOALS:   STG Name Target Date Goal status  1 Patient will be I with initial HEP in order to progress with therapy. Baseline: Achieved 10/06/2021 10/14/2021 Achieved  2 PT will review FOTO with patient by 3rd visit in order to understand expected progress and outcome with therapy. Baseline: Achieved 10/06/2021 10/14/2021 Achieved    LONG TERM GOALS:    LTG Name Target Date Goal status  1 Patient will be I with final  HEP to maintain progress from PT. Baseline: HEP given at eval 10/22/2021: Pt reports daily HEP adherence 11/11/2021 ACHIEVED  2 Patient will report >/= 63% status on FOTO to indicate improved functional ability. Baseline: 53% 10/13/2021: 55% 11/11/2021 PROGRESSING  3 Patient will demonstrate 5xSTS  in </= 12 seconds to indicate improve strength and reduced fall risk Baseline: 14 sec 10/22/2021: 11 seconds 11/11/2021 ACHIEVED  4 Patient will perform TUG in </= 9 seconds to indicate improved mobility and reduced fall risk Baseline: 12 sec 10/22/2021: 9 seconds 11/11/2021 ACHIEVED  5 Patient will achived >/= 49/56 on BERG balance to indicate improved balance reduced fall risk Baseline: 41/56 10/22/2021: 54/56 11/11/2021 ACHIEVED  6 *Added 10/22/2021* Pt will demonstrate ability to ascend/descend 20 steps with foot-over-foot pattern and no UE support in order to go up stairs at home and in her community Baseline: Pt unable to ascend 1 step without UE support 11/11/2021 INITIAL  7 *Added 10/22/2021* Pt will achieve a gait speed of 1.2 m/s or greater in a 2MWT in order to promote WNL functional community ambulation Baseline: 0.95 m/s 11/11/2021 INITIAL     PLAN: PT FREQUENCY: 1x/week   PT DURATION: 8 weeks   PLANNED INTERVENTIONS: Therapeutic exercises, Therapeutic activity, Neuro Muscular re-education, Balance training, Gait training, Patient/Family education, Joint mobilization, Stair training, Aquatic Therapy, Dry Needling, and Manual therapy   PLAN FOR NEXT SESSION: Review HEP and progress PRN, general LE strengthening, dynamic balance training   Vanessa McDermott, PT, DPT 10/22/21 1:11 PM   PHYSICAL THERAPY DISCHARGE SUMMARY  Visits from Start of Care: 6  Current functional level related to goals / functional outcomes: Pt has made improvements in balance and global BIL hip strength   Remaining deficits: Difficulty with stair negotiation, decreased gait speed   Education / Equipment: HEP   Patient agrees to discharge. Patient goals were partially met. Patient is being discharged due to not returning since the last visit.  Vanessa [place], PT, DPT 11/16/21 3:11 PM

## 2021-10-22 ENCOUNTER — Other Ambulatory Visit: Payer: Self-pay

## 2021-10-22 ENCOUNTER — Ambulatory Visit: Payer: BC Managed Care – PPO

## 2021-10-22 DIAGNOSIS — R296 Repeated falls: Secondary | ICD-10-CM | POA: Diagnosis not present

## 2021-10-22 DIAGNOSIS — M6281 Muscle weakness (generalized): Secondary | ICD-10-CM

## 2021-10-24 ENCOUNTER — Encounter: Payer: Self-pay | Admitting: Family Medicine

## 2021-10-25 ENCOUNTER — Ambulatory Visit: Payer: BC Managed Care – PPO | Admitting: Physical Therapy

## 2021-10-26 ENCOUNTER — Telehealth: Payer: Self-pay | Admitting: Family Medicine

## 2021-10-26 ENCOUNTER — Other Ambulatory Visit: Payer: Self-pay | Admitting: Internal Medicine

## 2021-10-26 DIAGNOSIS — N179 Acute kidney failure, unspecified: Secondary | ICD-10-CM

## 2021-10-26 LAB — PROTEIN / CREATININE RATIO, URINE
Albumin, U: 109.6
Creatinine, Urine: 225

## 2021-10-26 LAB — MICROALBUMIN / CREATININE URINE RATIO: Microalb Creat Ratio: 225

## 2021-10-26 MED ORDER — CHLORTHALIDONE 25 MG PO TABS
ORAL_TABLET | ORAL | 11 refills | Status: DC
Start: 2021-10-26 — End: 2022-02-14

## 2021-10-26 NOTE — Telephone Encounter (Signed)
Spoke w pt She had stopped her colchicine.  It sounds like she had also potentially stopped her allopurinol but restarted that the minute she started having gout issues.  We discussed.  I would not restart the allopurinol until 2 weeks after she gets over this gout attack.  She can use the colchicine up to 3 times a day for the next 2 days for gout.  She said her gout is about 50 to 75% better than it was yesterday.  She also saw the kidney doctor today and was told that perhaps her kidney issues might be related to too tight blood pressure control with multiple meds.  They decreased her Lasix to daily.  Changed her chlorthalidone to Monday Wednesday Friday only.  They are going to do an ultrasound and see her back in 2 months.  I have updated her medicine list.

## 2021-10-27 ENCOUNTER — Ambulatory Visit: Payer: BC Managed Care – PPO | Admitting: Physician Assistant

## 2021-10-27 ENCOUNTER — Other Ambulatory Visit: Payer: Self-pay

## 2021-10-27 DIAGNOSIS — D485 Neoplasm of uncertain behavior of skin: Secondary | ICD-10-CM

## 2021-10-27 DIAGNOSIS — D0471 Carcinoma in situ of skin of right lower limb, including hip: Secondary | ICD-10-CM

## 2021-10-27 DIAGNOSIS — D0462 Carcinoma in situ of skin of left upper limb, including shoulder: Secondary | ICD-10-CM

## 2021-10-27 NOTE — Patient Instructions (Addendum)

## 2021-11-01 ENCOUNTER — Encounter: Payer: BC Managed Care – PPO | Admitting: Physical Therapy

## 2021-11-03 ENCOUNTER — Telehealth: Payer: Self-pay | Admitting: *Deleted

## 2021-11-03 NOTE — Telephone Encounter (Signed)
-----   Message from Lavonna Monarch, MD sent at 11/03/2021 10:25 AM EST ----- 6 month follow up

## 2021-11-03 NOTE — Telephone Encounter (Signed)
Dixon back for results.

## 2021-11-04 ENCOUNTER — Other Ambulatory Visit: Payer: BC Managed Care – PPO

## 2021-11-04 NOTE — Telephone Encounter (Signed)
-----   Message from Lavonna Monarch, MD sent at 11/03/2021 10:25 AM EST ----- 6 month follow up

## 2021-11-04 NOTE — Telephone Encounter (Signed)
Path to patient 6 month follow up needed.

## 2021-11-05 ENCOUNTER — Other Ambulatory Visit: Payer: Self-pay | Admitting: Family Medicine

## 2021-11-08 ENCOUNTER — Encounter: Payer: BC Managed Care – PPO | Admitting: Physical Therapy

## 2021-11-09 ENCOUNTER — Ambulatory Visit
Admission: RE | Admit: 2021-11-09 | Discharge: 2021-11-09 | Disposition: A | Discharge status: Home / Self Care | Payer: BC Managed Care – PPO | Source: Ambulatory Visit | Attending: Internal Medicine | Admitting: Internal Medicine

## 2021-11-09 DIAGNOSIS — N179 Acute kidney failure, unspecified: Secondary | ICD-10-CM

## 2021-11-15 ENCOUNTER — Encounter: Payer: Self-pay | Admitting: Physician Assistant

## 2021-11-15 NOTE — Progress Notes (Signed)
Follow-Up Visit   Subjective  Pamela Thomas is a 66 y.o. female who presents for the following: Skin Problem (Patient has lesion on left shoulder x 6 months. It feels hard. Also has 2 lesions on her right lower leg that wont heal. ).   The following portions of the chart were reviewed this encounter and updated as appropriate:  Tobacco   Allergies   Meds   Problems   Med Hx   Surg Hx   Fam Hx       Objective  Well appearing patient in no apparent distress; mood and affect are within normal limits.  A full examination was performed including scalp, head, eyes, ears, nose, lips, neck, chest, axillae, abdomen, back, buttocks, bilateral upper extremities, bilateral lower extremities, hands, feet, fingers, toes, fingernails, and toenails. All findings within normal limits unless otherwise noted below.  Left Shoulder - Anterior Scaly pink papule or plaque.        Right Lower Leg - Anterior Scaly pink papule or plaque.         Assessment & Plan  Carcinoma in situ of skin of left upper extremity including shoulder Left Shoulder - Anterior  Skin / nail biopsy Type of biopsy: tangential   Informed consent: discussed and consent obtained   Timeout: patient name, date of birth, surgical site, and procedure verified   Anesthesia: the lesion was anesthetized in a standard fashion   Anesthetic:  1% lidocaine w/ epinephrine 1-100,000 local infiltration Instrument used: flexible razor blade   Hemostasis achieved with: ferric subsulfate   Outcome: patient tolerated procedure well   Post-procedure details: wound care instructions given    Destruction of lesion Complexity: simple   Destruction method: electrodesiccation and curettage   Informed consent: discussed and consent obtained   Timeout:  patient name, date of birth, surgical site, and procedure verified Anesthesia: the lesion was anesthetized in a standard fashion   Anesthetic:  1% lidocaine w/ epinephrine  1-100,000 local infiltration Curettage performed in three different directions: Yes   Curettage cycles:  3 Margin per side (cm):  0.1 Final wound size (cm):  1 Hemostasis achieved with:  aluminum chloride Outcome: patient tolerated procedure well with no complications   Post-procedure details: wound care instructions given    Specimen 2 - Surgical pathology Differential Diagnosis: scc vs bcc txpbx Check Margins: No  Carcinoma in situ of skin of right lower extremity including hip Right Lower Leg - Anterior  Skin / nail biopsy Type of biopsy: tangential   Informed consent: discussed and consent obtained   Timeout: patient name, date of birth, surgical site, and procedure verified   Anesthesia: the lesion was anesthetized in a standard fashion   Anesthetic:  1% lidocaine w/ epinephrine 1-100,000 local infiltration Instrument used: flexible razor blade   Hemostasis achieved with: ferric subsulfate   Outcome: patient tolerated procedure well   Post-procedure details: wound care instructions given    Destruction of lesion Complexity: simple   Destruction method: electrodesiccation and curettage   Informed consent: discussed and consent obtained   Timeout:  patient name, date of birth, surgical site, and procedure verified Anesthesia: the lesion was anesthetized in a standard fashion   Anesthetic:  1% lidocaine w/ epinephrine 1-100,000 local infiltration Curettage performed in three different directions: Yes   Curettage cycles:  3 Margin per side (cm):  0.1 Final wound size (cm):  1 Hemostasis achieved with:  aluminum chloride Outcome: patient tolerated procedure well with no complications   Post-procedure details:  wound care instructions given    Specimen 1 - Surgical pathology Differential Diagnosis: scc vs bcc txpbx Check Margins: No  No atypical nevi noted at the time of the visit.  I, Lilian Fuhs, PA-C, have reviewed all documentation's for this visit.  The  documentation on 11/15/21 for the exam, diagnosis, procedures and orders are all accurate and complete.

## 2021-11-19 ENCOUNTER — Ambulatory Visit: Payer: BC Managed Care – PPO | Admitting: Neurology

## 2021-12-06 ENCOUNTER — Other Ambulatory Visit: Payer: Self-pay | Admitting: Family Medicine

## 2021-12-09 MED ORDER — ALLOPURINOL 100 MG PO TABS: 100.0000 mg | ORAL_TABLET | Freq: Every day | ORAL | 2 refills | Status: DC

## 2021-12-09 NOTE — Addendum Note (Signed)
Addended by: Christen Bame D on: 12/09/2021 10:42 AM ? ? Modules accepted: Orders ? ?

## 2021-12-09 NOTE — Telephone Encounter (Signed)
First script failed in transmission.  Resent.  Christen Bame, CMA ? ?

## 2021-12-19 ENCOUNTER — Other Ambulatory Visit: Payer: Self-pay | Admitting: Family Medicine

## 2021-12-29 ENCOUNTER — Ambulatory Visit: Payer: BC Managed Care – PPO | Admitting: Neurology

## 2022-01-08 ENCOUNTER — Other Ambulatory Visit: Payer: Self-pay | Admitting: Family Medicine

## 2022-01-08 ENCOUNTER — Other Ambulatory Visit: Payer: Self-pay | Admitting: Cardiology

## 2022-01-31 ENCOUNTER — Encounter: Payer: Self-pay | Admitting: Family Medicine

## 2022-02-03 ENCOUNTER — Encounter: Payer: Self-pay | Admitting: Family Medicine

## 2022-02-03 ENCOUNTER — Other Ambulatory Visit: Payer: Self-pay | Admitting: Family Medicine

## 2022-02-03 DIAGNOSIS — Z1231 Encounter for screening mammogram for malignant neoplasm of breast: Secondary | ICD-10-CM

## 2022-02-03 MED ORDER — EMPAGLIFLOZIN 10 MG PO TABS: 10.0000 mg | ORAL_TABLET | Freq: Every day | ORAL | 3 refills | Status: DC

## 2022-02-04 ENCOUNTER — Other Ambulatory Visit: Payer: Self-pay | Admitting: Internal Medicine

## 2022-02-07 ENCOUNTER — Other Ambulatory Visit: Payer: Self-pay | Admitting: Family Medicine

## 2022-02-12 ENCOUNTER — Other Ambulatory Visit: Payer: Self-pay | Admitting: Cardiology

## 2022-02-15 ENCOUNTER — Encounter: Payer: Self-pay | Admitting: *Deleted

## 2022-02-28 ENCOUNTER — Ambulatory Visit: Payer: BC Managed Care – PPO

## 2022-02-28 ENCOUNTER — Ambulatory Visit: Payer: BC Managed Care – PPO | Admitting: Family Medicine

## 2022-02-28 ENCOUNTER — Encounter: Payer: Self-pay | Admitting: Family Medicine

## 2022-02-28 VITALS — BP 78/41 | HR 75 | Ht 62.0 in | Wt 225.6 lb

## 2022-02-28 DIAGNOSIS — E1165 Type 2 diabetes mellitus with hyperglycemia: Secondary | ICD-10-CM

## 2022-02-28 DIAGNOSIS — R011 Cardiac murmur, unspecified: Secondary | ICD-10-CM

## 2022-02-28 DIAGNOSIS — I951 Orthostatic hypotension: Secondary | ICD-10-CM | POA: Diagnosis not present

## 2022-02-28 DIAGNOSIS — I10 Essential (primary) hypertension: Secondary | ICD-10-CM | POA: Diagnosis not present

## 2022-02-28 NOTE — Assessment & Plan Note (Signed)
Murmur seems consistent with aortic stenosis. Given symptoms, will need to evaluate. - Echocardiogram - Monitor symptoms

## 2022-02-28 NOTE — Patient Instructions (Signed)
STOP taking the following medications: - Jardiance - Chlorthalidone - Lasix - Potassium supplement  Weigh yourself daily, keep a log. If you notice 3-5lb increase, call me.   Keep an eye on your blood pressures and keep a log. If you are having severe symptoms let me know. Use a walker so you do not fall.   We are also going to get an ultrasound of your heart.

## 2022-02-28 NOTE — Progress Notes (Signed)
    SUBJECTIVE:   CHIEF COMPLAINT / HPI:   Severe dizziness - Does not occur every day - Denies any falls - Is associated with vision blurriness, unclear if associated with patient's shortness of breath but each did occur over the last several months. - Recently started on Jardiance and chlorthalidone.  Patient unable to cut the chlorthalidone and takes a full 25 mg every other day (at night) - Worst episode was 6/18 (last chlorthalidone dose was 6/17 at night) - Notes worsening dizziness with going from sitting to standing at home - Does not check blood pressures at home - Does note that blood sugars have been elevated more recently to 298  Fatigue/shortness of breath - Has been progressively worsening for last 4 months - Now occurring daily with IADLs - Denies orthopnea or leg swelling   Current medications include chlorthalidone '25mg'$ , candesartan '16mg'$  daily, jardiance '10mg'$ , lasix '20mg'$ , metoprolol '50mg'$  BID.  PERTINENT  PMH / PSH: Reviewed  OBJECTIVE:   BP (!) 78/41   Pulse 75   Ht '5\' 2"'$  (1.575 m)   Wt 225 lb 9.6 oz (102.3 kg)   LMP 10/10/2010   SpO2 99%   BMI 41.26 kg/m    General: NAD, overall well appearing, appears mildly anxious CV: RRR, 3/8 systolic murmur loudest at the RUSB, no peripheral edema Pulm: CTAB, no wheezes/crackles GI: soft, non-tender, non-distended  ASSESSMENT/PLAN:   Orthostatic hypotension Orthostatics positive in clinic. At this time, likely related to polypharmacy with too many medications affecting blood pressure. Consideration for symptomatic aortic stenosis given known murmur.  - Stop chlorthalidone, lasix, jardiance, and potassium supplement - Monitor symptoms - Monitor BP at home with log - Weigh daily with return precautions given - Use walker and slow movements for position changes - Obtain echocardiogram to monitor aortic stenosis - Follow-up in 3 days if still symptomatic, otherwise 1 week follow-up to recheck BMP  CARDIAC  MURMUR Murmur seems consistent with aortic stenosis. Given symptoms, will need to evaluate. - Echocardiogram - Monitor symptoms  HYPERTENSION, BENIGN SYSTEMIC Stopping Lasix and chlorthalidone at this time given severe symptoms and low blood pressure. Goal is to limit risk of falling and symptoms.  - Closely monitor BP at home - BMP today  Type 2 diabetes mellitus with hyperglycemia, without long-term current use of insulin (HCC) - A1c today - Discontinue jardiance until symptoms improve, slowly add back as able     Rise Patience, [place]

## 2022-02-28 NOTE — Assessment & Plan Note (Signed)
-   A1c today - Discontinue jardiance until symptoms improve, slowly add back as able

## 2022-02-28 NOTE — Assessment & Plan Note (Signed)
Stopping Lasix and chlorthalidone at this time given severe symptoms and low blood pressure. Goal is to limit risk of falling and symptoms.  - Closely monitor BP at home - BMP today

## 2022-02-28 NOTE — Assessment & Plan Note (Addendum)
Orthostatics positive in clinic. At this time, likely related to polypharmacy with too many medications affecting blood pressure. Consideration for symptomatic aortic stenosis given known murmur.  - Stop chlorthalidone, lasix, jardiance, and potassium supplement - Monitor symptoms - Monitor BP at home with log - Weigh daily with return precautions given - Use walker and slow movements for position changes - Obtain echocardiogram to monitor aortic stenosis - Follow-up in 3 days if still symptomatic, otherwise 1 week follow-up to recheck BMP

## 2022-03-01 ENCOUNTER — Telehealth: Payer: Self-pay | Admitting: Family Medicine

## 2022-03-01 ENCOUNTER — Ambulatory Visit
Admission: RE | Admit: 2022-03-01 | Discharge: 2022-03-01 | Disposition: A | Discharge status: Home / Self Care | Payer: BC Managed Care – PPO | Source: Ambulatory Visit | Attending: Family Medicine | Admitting: Family Medicine

## 2022-03-01 DIAGNOSIS — Z1231 Encounter for screening mammogram for malignant neoplasm of breast: Secondary | ICD-10-CM

## 2022-03-01 NOTE — Telephone Encounter (Signed)
Calling to  f/u yesterdays visit for hypotension. LVM. I will try to call again in an hour or so Pamela Thomas

## 2022-03-01 NOTE — Telephone Encounter (Signed)
Spoke w pt She is feeling better We reviewed kidney function and A 1C both of which are up. She has ECHO tomorrow. I will call her Thursday Am at 11:00 (approx) and we will discuss followup Will need repeat labs next week. Will need to modify her diuretics as I think she is on too much. Potentially consider decrease ARB.

## 2022-03-02 ENCOUNTER — Ambulatory Visit (HOSPITAL_COMMUNITY)
Admission: RE | Admit: 2022-03-02 | Discharge: 2022-03-02 | Disposition: A | Discharge status: Home / Self Care | Payer: BC Managed Care – PPO | Source: Ambulatory Visit | Attending: Family Medicine | Admitting: Family Medicine

## 2022-03-02 DIAGNOSIS — I2510 Atherosclerotic heart disease of native coronary artery without angina pectoris: Secondary | ICD-10-CM | POA: Insufficient documentation

## 2022-03-02 DIAGNOSIS — E119 Type 2 diabetes mellitus without complications: Secondary | ICD-10-CM | POA: Insufficient documentation

## 2022-03-02 DIAGNOSIS — R011 Cardiac murmur, unspecified: Secondary | ICD-10-CM | POA: Insufficient documentation

## 2022-03-02 DIAGNOSIS — Z951 Presence of aortocoronary bypass graft: Secondary | ICD-10-CM | POA: Insufficient documentation

## 2022-03-02 DIAGNOSIS — I10 Essential (primary) hypertension: Secondary | ICD-10-CM | POA: Diagnosis not present

## 2022-03-02 DIAGNOSIS — E785 Hyperlipidemia, unspecified: Secondary | ICD-10-CM | POA: Diagnosis not present

## 2022-03-02 LAB — ECHOCARDIOGRAM COMPLETE
AR max vel: 1.46 cm2
AV Area VTI: 1.45 cm2
AV Area mean vel: 1.37 cm2
AV Mean grad: 10 mmHg
AV Peak grad: 18 mmHg
Ao pk vel: 2.12 m/s
Area-P 1/2: 3.72 cm2
S' Lateral: 2.4 cm

## 2022-03-02 NOTE — Progress Notes (Signed)
  Echocardiogram 2D Echocardiogram has been performed.  Pamela Thomas 03/02/2022, 8:50 AM

## 2022-03-03 ENCOUNTER — Ambulatory Visit: Payer: BC Managed Care – PPO | Admitting: Family Medicine

## 2022-03-03 ENCOUNTER — Encounter: Payer: Self-pay | Admitting: Family Medicine

## 2022-03-03 NOTE — Progress Notes (Unsigned)
Spoke w pt; feeling much better.Her BMP lab is still not entered and I am having lab f/u with that. Will continue meds at current reduced rate. She has appt with [name] on Monday and will get repeat labs. She is happy with that plan.

## 2022-03-04 LAB — BASIC METABOLIC PANEL
BUN/Creatinine Ratio: 16 (ref 12–28)
BUN: 44 mg/dL — ABNORMAL HIGH (ref 8–27)
CO2: 21 mmol/L (ref 20–29)
Calcium: 10.4 mg/dL — ABNORMAL HIGH (ref 8.7–10.3)
Chloride: 98 mmol/L (ref 96–106)
Creatinine, Ser: 2.83 mg/dL — ABNORMAL HIGH (ref 0.57–1.00)
Glucose: 118 mg/dL — ABNORMAL HIGH (ref 70–99)
Potassium: 4 mmol/L (ref 3.5–5.2)
Sodium: 142 mmol/L (ref 134–144)
eGFR: 18 mL/min/{1.73_m2} — ABNORMAL LOW (ref >59)

## 2022-03-04 LAB — HEMOGLOBIN A1C
Est. average glucose Bld gHb Est-mCnc: 197 mg/dL
Hgb A1c MFr Bld: 8.5 % — ABNORMAL HIGH (ref 4.8–5.6)

## 2022-03-07 ENCOUNTER — Encounter: Payer: Self-pay | Admitting: Family Medicine

## 2022-03-07 ENCOUNTER — Ambulatory Visit: Payer: BC Managed Care – PPO | Admitting: Family Medicine

## 2022-03-07 VITALS — BP 122/72 | HR 62 | Ht 62.0 in | Wt 226.0 lb

## 2022-03-07 DIAGNOSIS — I10 Essential (primary) hypertension: Secondary | ICD-10-CM | POA: Diagnosis not present

## 2022-03-07 DIAGNOSIS — I951 Orthostatic hypotension: Secondary | ICD-10-CM | POA: Diagnosis not present

## 2022-03-07 DIAGNOSIS — R5383 Other fatigue: Secondary | ICD-10-CM

## 2022-03-07 NOTE — Assessment & Plan Note (Signed)
Symptoms significantly improved after discontinuation of chlorthalidone, jardiance, and scheduled lasix. Aortic stenosis on echocardiogram is mild and unlikely to be contributing given symptom improvement with medication changes.  - Continue to monitor BP - Monitor for further symptoms - Caution with further blood pressure lowering agents

## 2022-03-07 NOTE — Assessment & Plan Note (Addendum)
BP remains stable off Lasix and Chlorthalidone. Home BP range 110-120/58-80s. - Discontinued chlorthalidone from medication list - Lasix now PRN for dyspnea and swelling (also given weight precautions in AVS) - Take potassium supplement when using PRN Lasix - BMP today

## 2022-03-08 LAB — CBC
Hematocrit: 33.7 % — ABNORMAL LOW (ref 34.0–46.6)
Hemoglobin: 11 g/dL — ABNORMAL LOW (ref 11.1–15.9)
MCH: 28.4 pg (ref 26.6–33.0)
MCHC: 32.6 g/dL (ref 31.5–35.7)
MCV: 87 fL (ref 79–97)
Platelets: 175 10*3/uL (ref 150–450)
RBC: 3.88 x10E6/uL (ref 3.77–5.28)
RDW: 15.5 % — ABNORMAL HIGH (ref 11.7–15.4)
WBC: 5.8 10*3/uL (ref 3.4–10.8)

## 2022-03-08 LAB — BASIC METABOLIC PANEL
BUN/Creatinine Ratio: 16 (ref 12–28)
BUN: 23 mg/dL (ref 8–27)
CO2: 23 mmol/L (ref 20–29)
Calcium: 10 mg/dL (ref 8.7–10.3)
Chloride: 104 mmol/L (ref 96–106)
Creatinine, Ser: 1.47 mg/dL — ABNORMAL HIGH (ref 0.57–1.00)
Glucose: 189 mg/dL — ABNORMAL HIGH (ref 70–99)
Potassium: 4 mmol/L (ref 3.5–5.2)
Sodium: 144 mmol/L (ref 134–144)
eGFR: 39 mL/min/{1.73_m2} — ABNORMAL LOW (ref >59)

## 2022-03-15 NOTE — Progress Notes (Unsigned)
Cardiology Office Note   Date:  03/16/2022   ID:  Pamela, Thomas 05-03-1956, MRN [id number]  PCP:  Dickie La, MD  Cardiologist:   Minus Breeding, MD   Chief Complaint  Patient presents with   Dizziness      History of Present Illness: Pamela Thomas is a 66 y.o. female who presents for evaluation of coronary disease.  She had an inferior infarct in [place].   She had an occluded right coronary artery.  She also had LAD 80% stenosis proximally at the takeoff of a prominent and tortuous diagonal branch.  Circumflex is very small and free of high-grade disease.  Her EF was well preserved with some probable mild inferior hypokinesis.  She had overlapping Synergy stents.  It was suggested that she have elective PCI of her LAD versus a LIMA.  She is now status post CABG.    Since I last saw her she had hypotension.  This happened relatively recently.  Her systolics were in the 53M and she was having presyncope.  She saw her primary provider and I reviewed these notes for this visit.  They stopped her Jardiance and chlorthalidone.  She is only taking her Lasix as needed and her potassium she takes her Lasix.  Her blood pressure is improved and the dizziness is improved.  She did have an echocardiogram and I reviewed these results.  Her EF was normal.  She has some mild aortic stenosis.  There were no other significant abnormalities.  Otherwise she has been doing well.  She is getting none of the jaw pain that she got when she had her heart attack.  She is not having any new shortness of breath, PND or orthopnea.  She is not having any palpitations.  She is limited in her activities because of back and joint pain.   Past Medical History:  Diagnosis Date   ALLERGIC RHINITIS, SEASONAL    Anxiety    Back pain    BCC (basal cell carcinoma of skin) 2005   Nose ([place])   Constipation    Coronary artery disease    Depression    Diabetes mellitus without  complication ([place])    DYSLIPIDEMIA    Dysrhythmia    palpitations-evaluated by Dr Johnsie Cancel 6/12/eccho 6/13   Glucose intolerance (impaired glucose tolerance)    History of blood transfusion    Hypertension    Joint pain    OBESITY, NOS    Osteoarthritis    rt knee   PLANTAR FASCIITIS, BILATERAL    Vertigo    Vitamin D deficiency     Past Surgical History:  Procedure Laterality Date   BREAST BIOPSY  05/27/03   left   CARDIAC CATHETERIZATION     CARPAL TUNNEL RELEASE  05/27/03   Right   CESAREAN SECTION  05/27/03   CORONARY ARTERY BYPASS GRAFT N/A 08/17/2018   Procedure: CORONARY ARTERY BYPASS GRAFTING (CABG) TIMES TWO: LIMA to LAD, SVG to RAMUS INTERMEDIATE)  USING LEFT INTERNAL MAMMARY ARTERY AND RIGHT GREATER SAPHENOUS VEIN HARVESTED ENDOSCOPICALLY.;  Surgeon: [name], MD;  Location: [facility];  Service: Open Heart Surgery;  Laterality: N/A;   KNEE SURGERY     left- removal fatty tumor   ROTATOR CUFF REPAIR     Bilateral   TEE WITHOUT CARDIOVERSION N/A 08/17/2018   Procedure: TRANSESOPHAGEAL ECHOCARDIOGRAM (TEE);  Surgeon: [name], MD;  Location: [facility];  Service: Open Heart Surgery;  Laterality: N/A;  TOTAL KNEE ARTHROPLASTY  10/10/2012   Procedure: TOTAL KNEE ARTHROPLASTY;  Surgeon: Gearlean Alf, MD;  Location: [facility];  Service: Orthopedics;  Laterality: Right;     Current Outpatient Medications  Medication Sig Dispense Refill   acetaminophen (TYLENOL) 500 MG tablet Take 500 mg by mouth every 6 (six) hours as needed for moderate pain.      allopurinol (ZYLOPRIM) 100 MG tablet TAKE 1 TABLET BY MOUTH EVERY DAY 30 tablet 2   aspirin 81 MG EC tablet TAKE 1 TABLET BY MOUTH EVERY DAY 100 tablet 1   candesartan (ATACAND) 16 MG tablet TAKE 1 TABLET BY MOUTH EVERY DAY 30 tablet 11   colchicine 0.6 MG tablet TAKE 1 TABLET BY MOUTH EVERY DAY 90 tablet 3   ezetimibe (ZETIA) 10 MG tablet TAKE 1 TABLET BY MOUTH EVERY DAY 30 tablet 11   FLUoxetine (PROZAC) 20 MG tablet  TAKE 1 TABLET BY MOUTH EVERY DAY 90 tablet 3   furosemide (LASIX) 20 MG tablet TAKE 1 TABLET (20 MG TOTAL) BY MOUTH DAILY. MAY TAKE AN ADDITIONAL TABLET AS NEEDED. 60 tablet 11   metFORMIN (GLUCOPHAGE) 1000 MG tablet Take 1 tablet (1,000 mg total) by mouth daily with breakfast. 180 tablet 3   metoprolol succinate (TOPROL-XL) 50 MG 24 hr tablet TAKE 1 TABLET (50 MG TOTAL) BY MOUTH 2 (TWO) TIMES DAILY. TAKE WITH OR IMMEDIATELY FOLLOWING A MEAL. 180 tablet 3   moxifloxacin (VIGAMOX) 0.5 % ophthalmic solution Apply 1 drop to eye 4 (four) times daily.     nitroGLYCERIN (NITROSTAT) 0.4 MG SL tablet Place 1 tablet (0.4 mg total) under the tongue every 5 (five) minutes as needed for chest pain. 25 tablet 3   potassium chloride SA (KLOR-CON M20) 20 MEQ tablet TAKE 1 TABLET BY MOUTH EVERY DAY (Patient taking differently: 20 mEq as needed (With Lasix dosing).) 90 tablet 3   rosuvastatin (CRESTOR) 40 MG tablet TAKE 1 TABLET BY MOUTH EVERY DAY 30 tablet 11   No current facility-administered medications for this visit.    Allergies:   Morphine and related, Icosapent ethyl, Statins, and Oxycodone    ROS:  Please see the history of present illness.   Otherwise, review of systems are positive for none.   All other systems are reviewed and negative.    PHYSICAL EXAM: VS:  BP (!) 102/54 (BP Location: Left Arm, Patient Position: Sitting, Cuff Size: Large)   Pulse 65   Ht '5\' 2"'$  (1.575 m)   Wt 224 lb 9.6 oz (101.9 kg)   LMP 10/10/2010   SpO2 97%   BMI 41.08 kg/m  , BMI Body mass index is 41.08 kg/m. GENERAL:  Well appearing NECK:  No jugular venous distention, waveform within normal limits, carotid upstroke brisk and symmetric, positive bilateral bruits, no thyromegaly LUNGS:  Clear to auscultation bilaterally CHEST:  Well healed sternotomy scar. HEART:  PMI not displaced or sustained,S1 and S2 within normal limits, no S3, no S4, no clicks, no rubs, 2 out of 6 apical systolic murmur radiating slightly at  aortic outflow tract but early peaking, no diastolic murmurs ABD:  Flat, positive bowel sounds normal in frequency in pitch, no bruits, no rebound, no guarding, no midline pulsatile mass, no hepatomegaly, no splenomegaly EXT:  2 plus pulses throughout, no edema, no cyanosis no clubbing  EKG:  EKG is  ordered today. Sinus rhythm, rate 65, axis within normal limits, intervals within normal limits, no acute ST-T wave changes.  Poor anterior R wave progression  Recent Labs: 08/25/2021: TSH 3.780 09/29/2021: ALT 84 03/07/2022: BUN 23; Creatinine, Ser 1.47; Hemoglobin 11.0; Platelets 175; Potassium 4.0; Sodium 144    Lipid Panel    Component Value Date/Time   CHOL 109 08/25/2021 1026   TRIG 352 (H) 08/25/2021 1026   HDL 26 (L) 08/25/2021 1026   CHOLHDL 4.2 08/25/2021 1026   CHOLHDL 5.5 05/11/2011 0932   VLDL NOT CALC 05/11/2011 0932   LDLCALC 32 08/25/2021 1026   LDLDIRECT 63 01/14/2019 1005   LDLDIRECT 76 07/27/2016 1048      Wt Readings from Last 3 Encounters:  03/16/22 224 lb 9.6 oz (101.9 kg)  03/07/22 226 lb (102.5 kg)  02/28/22 225 lb 9.6 oz (102.3 kg)    Lab Results  Component Value Date   HGBA1C 8.5 (H) 02/28/2022    Other studies Reviewed: Additional studies/ records that were reviewed today include: Primary care records, echocardiogram, labs Review of the above records demonstrates:  Please see elsewhere in the note.     ASSESSMENT AND PLAN:  CAD:   She is status post CABG.   The patient has no new sypmtoms.  No further cardiovascular testing is indicated.  We will continue with aggressive risk reduction and meds as listed.  DYSLIPIDEMIA:   LDL most recently was 32 with an HDL of 26.  No change in therapy.   Aortic stenosis:  She has mild AS in June 2023.  I will follow this clinically  DM:   A1c was up to 8.5 from 6.5.  However, she is having this actively managed by her primary provider who is planning to see her back soon to start a medication since she is  now off the [place].    CKD II:    Creat is stable at 1.47.   HTN:   Her BP is running low but she is tolerating this.  No change in therapy.   OBESITY:   We talked specifically again about activity and diet.  I have suggested water aerobics.  CAROTID STENOSIS:  1 - 39% on the right and 60 -79% on the left in Jan 2023.  We will follow this in 1 year.    Current medicines are reviewed at length with the patient today.  The patient does not have concerns regarding medicines.  The following changes have been made:   As above  Labs/ tests ordered today include:    Orders Placed This Encounter  Procedures   EKG 12-Lead     Disposition:   FU with me in 12 months.    Signed, Minus Breeding, MD  03/16/2022 12:02 PM    [place]

## 2022-03-16 ENCOUNTER — Encounter: Payer: Self-pay | Admitting: Cardiology

## 2022-03-16 ENCOUNTER — Ambulatory Visit: Payer: BC Managed Care – PPO | Admitting: Cardiology

## 2022-03-16 VITALS — BP 102/54 | HR 65 | Ht 62.0 in | Wt 224.6 lb

## 2022-03-16 DIAGNOSIS — E785 Hyperlipidemia, unspecified: Secondary | ICD-10-CM | POA: Diagnosis not present

## 2022-03-16 DIAGNOSIS — Z951 Presence of aortocoronary bypass graft: Secondary | ICD-10-CM | POA: Diagnosis not present

## 2022-03-16 DIAGNOSIS — E118 Type 2 diabetes mellitus with unspecified complications: Secondary | ICD-10-CM

## 2022-03-16 DIAGNOSIS — I2510 Atherosclerotic heart disease of native coronary artery without angina pectoris: Secondary | ICD-10-CM

## 2022-03-16 DIAGNOSIS — N182 Chronic kidney disease, stage 2 (mild): Secondary | ICD-10-CM

## 2022-03-16 DIAGNOSIS — I350 Nonrheumatic aortic (valve) stenosis: Secondary | ICD-10-CM

## 2022-03-16 MED ORDER — NITROGLYCERIN 0.4 MG SL SUBL: 0.4000 mg | SUBLINGUAL_TABLET | SUBLINGUAL | 3 refills | Status: AC | PRN

## 2022-03-16 NOTE — Patient Instructions (Signed)
Medication Instructions:  For as needed Nitroglycerin, if you develop chest pain: Sit and rest 5 minutes. If chest pain does not resolve place 1 nitroglycerin under your tongue and wait 5 minutes. If chest pain does not resolve, place a 2nd nitroglycerin under your tongue and wait 5 more minutes. If chest pain does not resolve, place a 3rd nitroglycerin under your tongue and seek emergency services.   *If you need a refill on your cardiac medications before your next appointment, please call your pharmacy*   Lab Work: None ordered today   Testing/Procedures: None ordered today   Follow-Up: At [facility], you and your health needs are our priority.  As part of our continuing mission to provide you with exceptional heart care, we have created designated Provider Care Teams.  These Care Teams include your primary Cardiologist (physician) and Advanced Practice Providers (APPs -  Physician Assistants and Nurse Practitioners) who all work together to provide you with the care you need, when you need it.  We recommend signing up for the patient portal called "MyChart".  Sign up information is provided on this After Visit Summary.  MyChart is used to connect with patients for Virtual Visits (Telemedicine).  Patients are able to view lab/test results, encounter notes, upcoming appointments, etc.  Non-urgent messages can be sent to your provider as well.   To learn more about what you can do with MyChart, go to [email or web address].    Your next appointment:   1 year(s)  The format for your next appointment:   In Person  Provider:   Minus Breeding, MD

## 2022-03-22 ENCOUNTER — Other Ambulatory Visit: Payer: Self-pay | Admitting: Family Medicine

## 2022-03-30 ENCOUNTER — Ambulatory Visit: Payer: BC Managed Care – PPO | Admitting: Family Medicine

## 2022-03-30 ENCOUNTER — Encounter: Payer: Self-pay | Admitting: Family Medicine

## 2022-03-30 VITALS — BP 140/69 | HR 56 | Ht 62.0 in | Wt 227.4 lb

## 2022-03-30 DIAGNOSIS — I951 Orthostatic hypotension: Secondary | ICD-10-CM | POA: Diagnosis not present

## 2022-03-30 DIAGNOSIS — E1165 Type 2 diabetes mellitus with hyperglycemia: Secondary | ICD-10-CM

## 2022-03-30 DIAGNOSIS — I10 Essential (primary) hypertension: Secondary | ICD-10-CM | POA: Diagnosis not present

## 2022-03-30 MED ORDER — SEMAGLUTIDE(0.25 OR 0.5MG/DOS) 2 MG/1.5ML ~~LOC~~ SOPN: 0.2500 mg | PEN_INJECTOR | SUBCUTANEOUS | 3 refills | Status: DC

## 2022-03-30 NOTE — Progress Notes (Signed)
    CHIEF COMPLAINT / HPI: #1.  Follow-up significant changes in her blood pressure regimen.  She is feeling much better.  Not as dizzy.  She is taking Lasix about every for 5 days.  Says she can tell when her feet and hands start to feel little tight. 2.  Follow-up diabetes mellitus with significant cardiovascular disease and obesity: Wants to discuss trying a different medication as add on.   PERTINENT  PMH / PSH: I have reviewed the patient's medications, allergies, past medical and surgical history, smoking status and updated in the EMR as appropriate.   OBJECTIVE:  BP 140/69   Pulse (!) 56   Ht '5\' 2"'$  (1.575 m)   Wt 227 lb 6.4 oz (103.1 kg)   LMP 10/10/2010   SpO2 98%   BMI 41.59 kg/m  Vital signs reviewed. GENERAL: Well-developed, well-nourished, no acute distress. CARDIOVASCULAR: Regular rate and rhythm no murmur gallop or rub LUNGS: Clear to auscultation bilaterally, no rales or wheeze. ABDOMEN: Soft positive bowel sounds NEURO: No gross focal neurological deficits. MSK: Movement of extremity x 4.   ASSESSMENT / PLAN: #1.  Blood pressure is well controlled.  We will continue current regimen.  Since she is only using Lasix once every 4 to 5 days I do not think we need to recheck her electrolytes today. 2.  We will start Ozempic.  See her back in 1 month.  Discussed cardiovascular benefit in addition to diabetes medication benefit.  No problem-specific Assessment & Plan notes found for this encounter.   [name] MD

## 2022-03-30 NOTE — Patient Instructions (Signed)
Lets try starting the ozempic. It is a once a week injection.Please call me if you have ANY questions or issues!

## 2022-04-18 ENCOUNTER — Encounter: Payer: Self-pay | Admitting: Family Medicine

## 2022-04-18 ENCOUNTER — Ambulatory Visit: Payer: BC Managed Care – PPO | Admitting: Family Medicine

## 2022-04-18 ENCOUNTER — Telehealth: Payer: Self-pay | Admitting: Family Medicine

## 2022-04-18 VITALS — HR 61 | Ht 62.0 in | Wt 224.2 lb

## 2022-04-18 DIAGNOSIS — L989 Disorder of the skin and subcutaneous tissue, unspecified: Secondary | ICD-10-CM | POA: Diagnosis not present

## 2022-04-18 DIAGNOSIS — M654 Radial styloid tenosynovitis [de Quervain]: Secondary | ICD-10-CM | POA: Diagnosis not present

## 2022-04-18 MED ORDER — DICLOFENAC SODIUM 1 % EX GEL: 2.0000 g | Freq: Four times a day (QID) | CUTANEOUS | 0 refills | Status: DC

## 2022-04-18 NOTE — Patient Instructions (Signed)
The pain in your wrist is most likely de Quervain's tenosynovitis.  I am going to prescribe Voltaren gel that you can use on the area to try and help with the pain.  The most important part will be using a thumb spica brace and using as much as you can during the day.  He can wear it at night if you are able to, but is more important to use during the day when you are active.  The spots on your hand do appear similar to granuloma annulare, this is a benign condition that usually will go away after a couple of months.  I would still make an appointment with dermatology if you are able to just to check up.  But I would also follow back up in our office if you feel like it is getting worse.

## 2022-04-18 NOTE — Progress Notes (Signed)
    SUBJECTIVE:   CHIEF COMPLAINT / HPI:   Right hand pain - Been present for the last few weeks - CMC worse when picking up things - Has had no injury to the area  Right hand skin lesions - Previously noted with picture on 7/19 - Patient feels like the area has gotten bigger - Minimal itching, no bleeding from the areas - Does have mild burning under water  PERTINENT  PMH / PSH: Reviewed  OBJECTIVE:   Pulse 61   Ht '5\' 2"'$  (1.575 m)   Wt 224 lb 3.2 oz (101.7 kg)   LMP 10/10/2010   SpO2 97%   BMI 41.01 kg/m   General: NAD, well-appearing, well-nourished Respiratory: No respiratory distress, breathing comfortably, able to speak in full sentences Skin: warm and dry, pink smooth annular lesions on right first MCP and PCP (pictured below MSK: Right wrist with full ROM, negative reverse Phalen's, positive Finkelstein's, tender to palpation along the lateral wrist/thumb tendons    ASSESSMENT/PLAN:   De Quervain's tenosynovitis Physical exam consistent with de Quervain's given positive Finkelstein's.  Likely secondary to repetitive motion with picking up things. - Thumb spica splint recommended, patient going to get from DME store - Voltaren gel as needed for pain - Tylenol as needed for pain - Ice the area as needed  Hand lesions Discussed with Dr. Wilfrid Lund, seems consistent with granuloma annular when compared to prior imaging on 7/19.  Reassuring that patient does not have many other associated symptoms. - Follow-up with dermatology - Call the office if worsening, can consider scheduling in our in-house dermatology clinic - Can trial OTC steroid cream on the areas - Anticipate improvement over the next few months   Pamela Thomas, [place]

## 2022-04-19 ENCOUNTER — Other Ambulatory Visit: Payer: Self-pay | Admitting: Family Medicine

## 2022-04-19 NOTE — Telephone Encounter (Signed)
Opened in error

## 2022-04-20 ENCOUNTER — Encounter (INDEPENDENT_AMBULATORY_CARE_PROVIDER_SITE_OTHER): Payer: Self-pay

## 2022-05-03 ENCOUNTER — Other Ambulatory Visit: Payer: Self-pay | Admitting: Family Medicine

## 2022-05-03 ENCOUNTER — Ambulatory Visit: Payer: BC Managed Care – PPO | Admitting: Family Medicine

## 2022-05-03 DIAGNOSIS — D489 Neoplasm of uncertain behavior, unspecified: Secondary | ICD-10-CM

## 2022-05-03 DIAGNOSIS — N3946 Mixed incontinence: Secondary | ICD-10-CM

## 2022-05-03 DIAGNOSIS — I10 Essential (primary) hypertension: Secondary | ICD-10-CM

## 2022-05-03 DIAGNOSIS — I2510 Atherosclerotic heart disease of native coronary artery without angina pectoris: Secondary | ICD-10-CM

## 2022-05-03 NOTE — Patient Instructions (Signed)
Keep area clean and dry for 24 hours. I will call you wit results. Great to see you!

## 2022-05-05 ENCOUNTER — Encounter: Payer: Self-pay | Admitting: Family Medicine

## 2022-05-05 DIAGNOSIS — N3946 Mixed incontinence: Secondary | ICD-10-CM | POA: Insufficient documentation

## 2022-05-05 DIAGNOSIS — D489 Neoplasm of uncertain behavior, unspecified: Secondary | ICD-10-CM | POA: Insufficient documentation

## 2022-05-05 NOTE — Assessment & Plan Note (Signed)
Punch biopsy performed.  We will notify her of results.

## 2022-05-05 NOTE — Assessment & Plan Note (Signed)
Referral to urology made.

## 2022-05-05 NOTE — Assessment & Plan Note (Signed)
She seems to be feeling much better than she was there for a while with the current medication regimen.  I do think there is some component of volume status with this.  We will follow this up in 3 months.  She will call in the interim with any new symptoms.

## 2022-05-05 NOTE — Assessment & Plan Note (Signed)
She is doing well on current medications.

## 2022-05-05 NOTE — Progress Notes (Signed)
    CHIEF COMPLAINT / HPI:  1.  #1 follow-up skin lesion on knuckle.  Seems to be getting larger. 2.  Continues to have some urinary leakage.  Not always associated with sensation of needing to urinate but more involuntary leakage.  Would like to explore options for this. 3.  Follow-up on generalized energy and fatigue: She is feeling much better.  She is being more active.  She is having difficulty doing any exercise because of the hot weather.  She has been very active at home canning from her garden. 4.  Wrist pain/thumb pain on the right.  Was seen by another provider and placed in a thumb splint which has been somewhat helpful.  She notes that the pain now seems to be in 2 different areas, 1 is in the forearm and the other is in the actual some MCP joint.  The forearm pain is better.  She does think that doing all the canning is making this a little worse because of the unusual activity and heavy lifting.  She does have difficulty opening jars with that hand.   PERTINENT  PMH / PSH: I have reviewed the patient's medications, allergies, past medical and surgical history, smoking status and updated in the EMR as appropriate.   OBJECTIVE:  LMP 10/10/2010   GENERAL: Well-developed female no acute distress SKIN: Right MCP knuckle skin area reveals raised monochromatic erythematous area with some central clearing.  There is a photo in the [employer].  She also has some additional small 2 mm slightly raised papule that is not erythematous on the PIP joint skin of the same finger, right index. WRIST/thumb: Mild tender to palpation over the area for de Quervain's tenosynovitis.  She says this is improved.  She also has some pain with resisted extension and abduction at the thumb MCP joint.  Mild tenderness to palpation.  No swelling is noted.  There is no effusion here.  PROCEDURE: Punch biopsy skin right hand. Patient given informed consent, signed copy in the chart.  Area was prepped and draped in  aseptic fashion and anesthesia was obtained using 1 cc of 1% lidocaine without epinephrine.  A 3 mm punch biopsy was then performed at the edge of the lesion.  Hemostasis was obtained with pressure and Drysol.  Band-Aid was applied.  Postprocedure instructions were given.  Pathology is pending. ASSESSMENT / PLAN:   Mixed stress and urge urinary incontinence Referral to urology made.  Neoplasm of uncertain behavior Punch biopsy performed.  We will notify her of results.  HYPERTENSION, BENIGN SYSTEMIC She seems to be feeling much better than she was there for a while with the current medication regimen.  I do think there is some component of volume status with this.  We will follow this up in 3 months.  She will call in the interim with any new symptoms.  Coronary artery disease She is doing well on current medications.   [name] MD

## 2022-05-06 ENCOUNTER — Other Ambulatory Visit: Payer: Self-pay | Admitting: Family Medicine

## 2022-05-06 DIAGNOSIS — L920 Granuloma annulare: Secondary | ICD-10-CM

## 2022-05-06 MED ORDER — CLOBETASOL PROPIONATE 0.05 % EX OINT: TOPICAL_OINTMENT | CUTANEOUS | 2 refills | Status: DC

## 2022-05-06 NOTE — Progress Notes (Signed)
Discussed bx results granuloma annulare. Will start bid hi potendy topicals and have her f/u 6 weeks or so. If not improving we could try intra lesional steroids.

## 2022-06-08 ENCOUNTER — Ambulatory Visit: Payer: BC Managed Care – PPO | Admitting: Physician Assistant

## 2022-06-08 ENCOUNTER — Ambulatory Visit: Payer: BC Managed Care – PPO | Admitting: Family Medicine

## 2022-06-08 ENCOUNTER — Encounter: Payer: Self-pay | Admitting: Family Medicine

## 2022-06-08 VITALS — BP 123/70 | HR 59 | Temp 98.1°F | Ht 62.0 in | Wt 224.6 lb

## 2022-06-08 DIAGNOSIS — E1165 Type 2 diabetes mellitus with hyperglycemia: Secondary | ICD-10-CM | POA: Diagnosis not present

## 2022-06-08 DIAGNOSIS — I2510 Atherosclerotic heart disease of native coronary artery without angina pectoris: Secondary | ICD-10-CM | POA: Diagnosis not present

## 2022-06-08 DIAGNOSIS — N3946 Mixed incontinence: Secondary | ICD-10-CM

## 2022-06-08 DIAGNOSIS — L920 Granuloma annulare: Secondary | ICD-10-CM

## 2022-06-08 DIAGNOSIS — M654 Radial styloid tenosynovitis [de Quervain]: Secondary | ICD-10-CM | POA: Diagnosis not present

## 2022-06-08 LAB — POCT GLYCOSYLATED HEMOGLOBIN (HGB A1C): HbA1c, POC (controlled diabetic range): 7.3 % — AB (ref 0.0–7.0)

## 2022-06-08 MED ORDER — TRIAMCINOLONE ACETONIDE 0.1 % EX CREA: 1.0000 | TOPICAL_CREAM | Freq: Two times a day (BID) | CUTANEOUS | 1 refills | Status: DC

## 2022-06-08 NOTE — Patient Instructions (Signed)
I am getting some labs today and I will give you a call once I get them so we can discuss medication changes.  I sent in some mild cortisone type cream for your face.  Continue to use the high-dose on your hand only.  I have sent a question and see why the urology group is not called you and I have sent a referral in for you to see the hand surgeon.  I would recommend that you continue the Tylenol, topical and brace for the thumb in the interim until you see the hand surgeon.  Lets plan on a regular checkup to follow chronic issues in 3 to 4 months.  In the interim if you have issues or questions, please feel free to call me or use MyChart.

## 2022-06-09 DIAGNOSIS — M654 Radial styloid tenosynovitis [de Quervain]: Secondary | ICD-10-CM | POA: Insufficient documentation

## 2022-06-09 LAB — COMPREHENSIVE METABOLIC PANEL
ALT: 55 IU/L — ABNORMAL HIGH (ref 0–32)
AST: 65 IU/L — ABNORMAL HIGH (ref 0–40)
Albumin/Globulin Ratio: 1.8 (ref 1.2–2.2)
Albumin: 4.2 g/dL (ref 3.9–4.9)
Alkaline Phosphatase: 70 IU/L (ref 44–121)
BUN/Creatinine Ratio: 19 (ref 12–28)
BUN: 23 mg/dL (ref 8–27)
Bilirubin Total: 0.3 mg/dL (ref 0.0–1.2)
CO2: 25 mmol/L (ref 20–29)
Calcium: 9.9 mg/dL (ref 8.7–10.3)
Chloride: 99 mmol/L (ref 96–106)
Creatinine, Ser: 1.21 mg/dL — ABNORMAL HIGH (ref 0.57–1.00)
Globulin, Total: 2.3 g/dL (ref 1.5–4.5)
Glucose: 154 mg/dL — ABNORMAL HIGH (ref 70–99)
Potassium: 4.4 mmol/L (ref 3.5–5.2)
Sodium: 139 mmol/L (ref 134–144)
Total Protein: 6.5 g/dL (ref 6.0–8.5)
eGFR: 49 mL/min/{1.73_m2} — ABNORMAL LOW (ref >59)

## 2022-06-09 LAB — CBC
Hematocrit: 39.3 % (ref 34.0–46.6)
Hemoglobin: 12.2 g/dL (ref 11.1–15.9)
MCH: 26.8 pg (ref 26.6–33.0)
MCHC: 31 g/dL — ABNORMAL LOW (ref 31.5–35.7)
MCV: 86 fL (ref 79–97)
Platelets: 223 10*3/uL (ref 150–450)
RBC: 4.55 x10E6/uL (ref 3.77–5.28)
RDW: 14.3 % (ref 11.7–15.4)
WBC: 7.5 10*3/uL (ref 3.4–10.8)

## 2022-06-09 NOTE — Assessment & Plan Note (Signed)
Continue topical high potency steroid and follow-up 3 to 4 months.

## 2022-06-09 NOTE — Assessment & Plan Note (Signed)
We will send message to referral clerk to see why they have not called her yet.

## 2022-06-09 NOTE — Progress Notes (Signed)
    CHIEF COMPLAINT / HPI: Has several issues she wants to check 1.  Noticed a little more  fatigue than usual recently. 2.  Diabetes mellitus, wants to discuss medication adjustment again.  She is considering retrying Ozempic or similar. 3.  Itchy skin around her left eye.  Not in the eye and not associated with vision changes.  Bothers her mostly at night.  She has been using some topical anti-itch cream but was wondering if there is something better. 4.  Granuloma annual RA: He has been using the topical medication and seems to be improving. #5.  She never did hear back from the urology office where we referred her. 6.  Continues to have significant right thumb pain that is debilitating.  If she wears the universal thumb splint and uses Tylenol and the topical cream she has some improvement but she has to come out of the brace at all she has significant difficulty with everyday tasks.   PERTINENT  PMH / PSH: I have reviewed the patient's medications, allergies, past medical and surgical history, smoking status and updated in the EMR as appropriate.   OBJECTIVE:  BP 123/70   Pulse (!) 59   Temp 98.1 F (36.7 C) (Oral)   Ht '5\' 2"'$  (1.575 m)   Wt 224 lb 9.6 oz (101.9 kg)   LMP 10/10/2010   SpO2 98%   BMI 41.08 kg/m  GENERAL: Well-developed female no acute distress WRIST/hand right: Tender to palpation over the thumb soft tissue area in general.  No thenar atrophy.  Grip strength is limited by pain.  ASSESSMENT / PLAN:  For fatigue, will check CBC as well as other labs today.  She reports this is kind of intermittently chronic for her. Granuloma annulare Continue topical high potency steroid and follow-up 3 to 4 months.  De Quervain's disease (tenosynovitis) I think she has chronic de Quervain's with some likely associated CMC arthritis.  I will refer her to hand surgeon.  Mixed stress and urge urinary incontinence We will send message to referral clerk to see why they have not  called her yet.   [name] MD

## 2022-06-09 NOTE — Assessment & Plan Note (Signed)
I think she has chronic de Quervain's with some likely associated CMC arthritis.  I will refer her to hand surgeon.

## 2022-06-10 ENCOUNTER — Encounter: Payer: Self-pay | Admitting: Family Medicine

## 2022-06-10 ENCOUNTER — Other Ambulatory Visit: Payer: Self-pay | Admitting: Family Medicine

## 2022-06-10 MED ORDER — SEMAGLUTIDE(0.25 OR 0.5MG/DOS) 2 MG/1.5ML ~~LOC~~ SOPN: 0.5000 mg | PEN_INJECTOR | SUBCUTANEOUS | 3 refills | Status: DC

## 2022-06-10 NOTE — Progress Notes (Signed)
Talk with her about results.  We will increase semaglutide dose to 0.5 mg weekly.  She has not been taking the Klor-Con and her potassium at 5 on most recent CMP so we will just discontinue that.  Additionally her kidney function was improved.  Follow-up with A1c in 3 months.

## 2022-06-14 ENCOUNTER — Other Ambulatory Visit: Payer: Self-pay | Admitting: Family Medicine

## 2022-06-15 ENCOUNTER — Ambulatory Visit: Payer: BC Managed Care – PPO | Admitting: Physician Assistant

## 2022-06-27 ENCOUNTER — Other Ambulatory Visit: Payer: Self-pay | Admitting: Family Medicine

## 2022-07-19 ENCOUNTER — Ambulatory Visit (INDEPENDENT_AMBULATORY_CARE_PROVIDER_SITE_OTHER): Payer: BC Managed Care – PPO

## 2022-07-19 DIAGNOSIS — Z23 Encounter for immunization: Secondary | ICD-10-CM

## 2022-07-19 NOTE — Progress Notes (Signed)
Patient presents to nurse clinic for flu and COVID vaccinations. Administered flu in RD, COVID in LD, patient tolerated injections well, sites unremarkable.   Patient monitored for 15 minutes post COVID vaccination. No signs of adverse reaction.   Provided patient with updated immunization card.   Talbot Grumbling, RN

## 2022-08-11 ENCOUNTER — Other Ambulatory Visit: Payer: Self-pay | Admitting: Family Medicine

## 2022-09-08 ENCOUNTER — Other Ambulatory Visit: Payer: Self-pay | Admitting: Family Medicine

## 2022-09-09 ENCOUNTER — Other Ambulatory Visit: Payer: Self-pay | Admitting: Family Medicine

## 2022-09-14 DIAGNOSIS — G5602 Carpal tunnel syndrome, left upper limb: Secondary | ICD-10-CM | POA: Insufficient documentation

## 2022-09-30 ENCOUNTER — Ambulatory Visit (HOSPITAL_COMMUNITY): Payer: BC Managed Care – PPO

## 2022-09-30 ENCOUNTER — Other Ambulatory Visit (HOSPITAL_COMMUNITY): Payer: Self-pay | Admitting: Cardiology

## 2022-09-30 DIAGNOSIS — I6523 Occlusion and stenosis of bilateral carotid arteries: Secondary | ICD-10-CM

## 2022-10-02 ENCOUNTER — Other Ambulatory Visit: Payer: Self-pay | Admitting: Family Medicine

## 2022-10-07 ENCOUNTER — Ambulatory Visit (HOSPITAL_COMMUNITY): Payer: BC Managed Care – PPO

## 2022-10-31 ENCOUNTER — Telehealth (HOSPITAL_BASED_OUTPATIENT_CLINIC_OR_DEPARTMENT_OTHER): Payer: Self-pay | Admitting: *Deleted

## 2022-10-31 NOTE — Telephone Encounter (Signed)
Spoke with patient regarding new appointment date and time for the Carotid doppler rescheduled  from 11/01/22  at 11:00 am to Tuesday 11/08/22 at 3:00 pm.  Patient voiced her understanding.

## 2022-11-01 ENCOUNTER — Ambulatory Visit (HOSPITAL_COMMUNITY): Admission: RE | Admit: 2022-11-01 | Payer: BC Managed Care – PPO | Source: Ambulatory Visit

## 2022-11-05 ENCOUNTER — Other Ambulatory Visit: Payer: Self-pay | Admitting: Family Medicine

## 2022-11-08 ENCOUNTER — Ambulatory Visit (HOSPITAL_COMMUNITY)
Admission: RE | Admit: 2022-11-08 | Discharge: 2022-11-08 | Disposition: A | Discharge status: Home / Self Care | Payer: BC Managed Care – PPO | Source: Ambulatory Visit | Attending: Cardiology | Admitting: Cardiology

## 2022-11-08 DIAGNOSIS — I6523 Occlusion and stenosis of bilateral carotid arteries: Secondary | ICD-10-CM | POA: Insufficient documentation

## 2022-11-14 ENCOUNTER — Encounter: Payer: Self-pay | Admitting: *Deleted

## 2022-12-24 ENCOUNTER — Other Ambulatory Visit: Payer: Self-pay | Admitting: Family Medicine

## 2022-12-28 ENCOUNTER — Encounter: Payer: Self-pay | Admitting: Family Medicine

## 2022-12-28 ENCOUNTER — Ambulatory Visit: Payer: BC Managed Care – PPO | Admitting: Family Medicine

## 2022-12-28 VITALS — BP 106/68 | HR 62 | Ht 62.0 in | Wt 210.2 lb

## 2022-12-28 DIAGNOSIS — N3946 Mixed incontinence: Secondary | ICD-10-CM

## 2022-12-28 DIAGNOSIS — I10 Essential (primary) hypertension: Secondary | ICD-10-CM

## 2022-12-28 DIAGNOSIS — M654 Radial styloid tenosynovitis [de Quervain]: Secondary | ICD-10-CM

## 2022-12-28 DIAGNOSIS — E1165 Type 2 diabetes mellitus with hyperglycemia: Secondary | ICD-10-CM | POA: Diagnosis not present

## 2022-12-28 DIAGNOSIS — E785 Hyperlipidemia, unspecified: Secondary | ICD-10-CM

## 2022-12-28 DIAGNOSIS — I25118 Atherosclerotic heart disease of native coronary artery with other forms of angina pectoris: Secondary | ICD-10-CM

## 2022-12-28 DIAGNOSIS — E1169 Type 2 diabetes mellitus with other specified complication: Secondary | ICD-10-CM

## 2022-12-28 LAB — POCT GLYCOSYLATED HEMOGLOBIN (HGB A1C): HbA1c, POC (controlled diabetic range): 6.4 % (ref 0.0–7.0)

## 2022-12-28 MED ORDER — METOPROLOL SUCCINATE ER 50 MG PO TB24: ORAL_TABLET | ORAL | 3 refills | Status: DC

## 2022-12-28 MED ORDER — HYDROXYZINE HCL 10 MG PO TABS: 10.0000 mg | ORAL_TABLET | Freq: Three times a day (TID) | ORAL | 0 refills | Status: DC | PRN

## 2022-12-28 NOTE — Patient Instructions (Signed)
Great to see you! I would recommend you follow up in 3 months Call me if the dose of metoprolol changes

## 2022-12-29 ENCOUNTER — Encounter: Payer: Self-pay | Admitting: Family Medicine

## 2022-12-29 LAB — LIPID PANEL
Chol/HDL Ratio: 2.9 ratio (ref 0.0–4.4)
Cholesterol, Total: 93 mg/dL — ABNORMAL LOW (ref 100–199)
HDL: 32 mg/dL — ABNORMAL LOW (ref >39)
LDL Chol Calc (NIH): 22 mg/dL (ref 0–99)
Triglycerides: 263 mg/dL — ABNORMAL HIGH (ref 0–149)
VLDL Cholesterol Cal: 39 mg/dL (ref 5–40)

## 2022-12-29 LAB — COMPREHENSIVE METABOLIC PANEL
ALT: 59 IU/L — ABNORMAL HIGH (ref 0–32)
AST: 62 IU/L — ABNORMAL HIGH (ref 0–40)
Albumin/Globulin Ratio: 1.8 (ref 1.2–2.2)
Albumin: 4.2 g/dL (ref 3.9–4.9)
Alkaline Phosphatase: 74 IU/L (ref 44–121)
BUN/Creatinine Ratio: 24 (ref 12–28)
BUN: 51 mg/dL — ABNORMAL HIGH (ref 8–27)
Bilirubin Total: 0.3 mg/dL (ref 0.0–1.2)
CO2: 24 mmol/L (ref 20–29)
Calcium: 10.4 mg/dL — ABNORMAL HIGH (ref 8.7–10.3)
Chloride: 101 mmol/L (ref 96–106)
Creatinine, Ser: 2.09 mg/dL — ABNORMAL HIGH (ref 0.57–1.00)
Globulin, Total: 2.3 g/dL (ref 1.5–4.5)
Glucose: 72 mg/dL (ref 70–99)
Potassium: 4 mmol/L (ref 3.5–5.2)
Sodium: 141 mmol/L (ref 134–144)
Total Protein: 6.5 g/dL (ref 6.0–8.5)
eGFR: 25 mL/min/{1.73_m2} — ABNORMAL LOW (ref >59)

## 2022-12-29 NOTE — Assessment & Plan Note (Signed)
We discussed.  Her blood pressure is on the lower range.  Will drop her metoprolol dose by half once a day.  If she still having this dizziness, then I would decrease both doses by half to 25 twice daily.  She will let me know over the next week to 10 days which dose works for her better.  If the dizziness does not go away, she will let me know.  Will check some labs today.  Notably she has had significant weight loss so this may be affecting blood pressure control and she may not need as much medication as previously

## 2022-12-29 NOTE — Progress Notes (Signed)
CHIEF COMPLAINT / HPI: #1 1.  Follow-up Ozempic.  She has had no side effects from it.  She is not taking it every week just because her schedules been really busy and she has been doing a lot of traveling.  She would like to advance to the next dose when appropriate. 2.  Evidence of episodes of dizziness.  Has had these previously and at the time we decreased some of her blood pressure medicines and that seem to help.  Mostly when she stands up but can occur when she is just doing things or standing with no specific activity.  Just lightheaded.  Feels like she is going to pass out once or twice but has had no falls from this. 3.  Has had several hand surgeries since I saw her last and is quite pleased with the results. 4.  Is lost 25 pounds.  Says she is really doing well with her diet.  She is using Aptiva as a meal plan.  She has been doing more walking. 5.  Socially, she is enamored with her dog for rest.  Says one of the best decisions of her life to get the dog.  He follows her everywhere.   PERTINENT  PMH / PSH: I have reviewed the patient's medications, allergies, past medical and surgical history, smoking status and updated in the EMR as appropriate.   OBJECTIVE:  BP 106/68   Pulse 62   Ht  (1.575 m)   Wt 210 lb 3.2 oz (95.3 kg)   LMP 10/10/2010   SpO2 97%   BMI 38.45 kg/m  Vital signs reviewed. GENERAL: Well-developed, well-nourished, no acute distress. CARDIOVASCULAR: Regular rate and rhythm no murmur gallop or rub LUNGS: Clear to auscultation bilaterally, no rales or wheeze. ABDOMEN: Soft positive bowel sounds NEURO: No gross focal neurological deficits. MSK: Movement of extremity x 4. PSYCH: AxOx4. Good eye contact.. No psychomotor retardation or agitation. Appropriate speech fluency and content. Asks and answers questions appropriately. Mood is congruent.   ASSESSMENT / PLAN:   Type 2 diabetes mellitus with hyperglycemia, without long-term current use of  insulin (HCC) A1c looks fabulous today.  Will continue on her current dose of the Ozempic until she has been on that dose steadily for 4 weeks.  I would hesitate to increase her dose at this time because she might have side effects that she has had so many missed doses.  I do think it would be helpful to increase her dose appropriately given her history of cardiovascular disease.  She continues on metformin 2000 mg daily.  We could potentially look at discontinuing 1 of those meds per day.  Will follow that up at next office visit.  Congratulated on her control and her weight loss.  HYPERTENSION, BENIGN SYSTEMIC We discussed.  Her blood pressure is on the lower range.  Will drop her metoprolol dose by half once a day.  If she still having this dizziness, then I would decrease both doses by half to 25 twice daily.  She will let me know over the next week to 10 days which dose works for her better.  If the dizziness does not go away, she will let me know.  Will check some labs today.  Notably she has had significant weight loss so this may be affecting blood pressure control and she may not need as much medication as previously  Mixed stress and urge urinary incontinence She has had workup with urology and they are planning  to start medication soon.  She is put that off because she was traveling out of the country but has follow-up with them.  De Quervain's disease (tenosynovitis) Significant improvement with recent surgical intervention.   Denny Levy MD

## 2022-12-29 NOTE — Addendum Note (Signed)
Addended byDenny Levy L on: 12/29/2022 02:28 PM   Modules accepted: Orders

## 2022-12-29 NOTE — Assessment & Plan Note (Signed)
A1c looks fabulous today.  Will continue on her current dose of the Ozempic until she has been on that dose steadily for 4 weeks.  I would hesitate to increase her dose at this time because she might have side effects that she has had so many missed doses.  I do think it would be helpful to increase her dose appropriately given her history of cardiovascular disease.  She continues on metformin 2000 mg daily.  We could potentially look at discontinuing 1 of those meds per day.  Will follow that up at next office visit.  Congratulated on her control and her weight loss.

## 2022-12-29 NOTE — Assessment & Plan Note (Signed)
She has had workup with urology and they are planning to start medication soon.  She is put that off because she was traveling out of the country but has follow-up with them.

## 2022-12-29 NOTE — Assessment & Plan Note (Signed)
Significant improvement with recent surgical intervention.

## 2023-01-19 ENCOUNTER — Encounter: Payer: Self-pay | Admitting: Family Medicine

## 2023-01-19 ENCOUNTER — Other Ambulatory Visit: Payer: Self-pay | Admitting: Family Medicine

## 2023-01-20 ENCOUNTER — Other Ambulatory Visit: Payer: Self-pay | Admitting: Family Medicine

## 2023-01-20 MED ORDER — SEMAGLUTIDE (1 MG/DOSE) 4 MG/3ML ~~LOC~~ SOPN
1.0000 mg | PEN_INJECTOR | SUBCUTANEOUS | 3 refills | Status: DC
Start: 2023-01-20 — End: 2023-03-07

## 2023-01-24 ENCOUNTER — Other Ambulatory Visit: Payer: Self-pay | Admitting: Cardiology

## 2023-01-25 ENCOUNTER — Encounter: Payer: Self-pay | Admitting: Family Medicine

## 2023-02-23 ENCOUNTER — Other Ambulatory Visit: Payer: Self-pay | Admitting: Cardiology

## 2023-03-07 ENCOUNTER — Encounter: Payer: Self-pay | Admitting: Family Medicine

## 2023-03-07 MED ORDER — SEMAGLUTIDE (1 MG/DOSE) 4 MG/3ML ~~LOC~~ SOPN: 1.0000 mg | PEN_INJECTOR | SUBCUTANEOUS | 3 refills | Status: DC

## 2023-04-17 ENCOUNTER — Encounter: Payer: Self-pay | Admitting: Family Medicine

## 2023-04-17 ENCOUNTER — Ambulatory Visit (INDEPENDENT_AMBULATORY_CARE_PROVIDER_SITE_OTHER): Payer: BC Managed Care – PPO | Admitting: Family Medicine

## 2023-04-17 VITALS — BP 104/50 | HR 65 | Wt 210.8 lb

## 2023-04-17 DIAGNOSIS — N3946 Mixed incontinence: Secondary | ICD-10-CM

## 2023-04-17 DIAGNOSIS — I10 Essential (primary) hypertension: Secondary | ICD-10-CM | POA: Diagnosis not present

## 2023-04-17 DIAGNOSIS — M62838 Other muscle spasm: Secondary | ICD-10-CM

## 2023-04-17 MED ORDER — BENZONATATE 100 MG PO CAPS: 100.0000 mg | ORAL_CAPSULE | Freq: Three times a day (TID) | ORAL | 0 refills | Status: DC | PRN

## 2023-04-17 MED ORDER — METOPROLOL SUCCINATE ER 25 MG PO TB24: 25.0000 mg | ORAL_TABLET | Freq: Two times a day (BID) | ORAL | 0 refills | Status: DC

## 2023-04-17 NOTE — Assessment & Plan Note (Signed)
Improved with myrbetriq. Continue to follow with urology. If continues to contribute to dizziness, could consider half dose.

## 2023-04-17 NOTE — Assessment & Plan Note (Signed)
Remains low normal today with previous report of orthostatic symptoms. Improved since decreasing metoprolol dose, sent new rx to reflect current dosing. Will continue to monitor. If remains low on f/u, consider decreasing cadesartan as able. Reviewed previous labs.

## 2023-04-17 NOTE — Patient Instructions (Addendum)
It was great to see you!  Our plans for today:  - We sent a new prescription for your updated metoprolol dose. - If you are still having trouble with dizziness with the decreased metoprolol dose, you can cut your myrbetriq dose in half and let your urologist know. - Try the stretches and massage for your neck pain. - Take the tessalon perles for your cough. - Come back in 1-2 months to see Dr. Jennette Kettle.  Take care and seek immediate care sooner if you develop any concerns.   Dr. Linwood Dibbles

## 2023-04-17 NOTE — Progress Notes (Signed)
   SUBJECTIVE:   CHIEF COMPLAINT / HPI:   Medication effect - recently put on myrbetriq by urology, has been on for about 1 month. Noticed worsened dizziness, worse with standing. Has since reduced metoprolol to 25mg  BID with improvement. - myrbetriq helping with bladder spasms, not much with dripping.  Crick in neck - pain in back of neck, worse in morning upon wakening. Attributes to sleeping on it wrong. - ongoing for about 1 month, waxes and wanes - able to fully move neck but sometimes pain with rotation - denies numbness, tingling, radiation of pain  URI - onset Friday. - cough, clear runny nose - COVID negative x2 - has taken mucinex with relief.  Pertinent PMH: CAD s/p CABG, HTN, T2DM, mixed urinary incontinence  OBJECTIVE:   BP (!) 104/50 (BP Location: Left Arm, Patient Position: Sitting, Cuff Size: Large)   Pulse 65   Wt 210 lb 12.8 oz (95.6 kg)   LMP 10/10/2010   SpO2 98%   BMI 38.56 kg/m   Gen: well appearing, in NAD Card: RRR Lungs: CTAB, no wheezes/rales/rhonchi MSK: Neck: no midline spinal tenderness, hypertonic trapezius bilaterally with TTP. No paravertebral hypertonicity or tenderness. Able to flex neck.  Ext: WWP, no edema   ASSESSMENT/PLAN:   HYPERTENSION, BENIGN SYSTEMIC Remains low normal today with previous report of orthostatic symptoms. Improved since decreasing metoprolol dose, sent new rx to reflect current dosing. Will continue to monitor. If remains low on f/u, consider decreasing cadesartan as able. Reviewed previous labs.   Mixed stress and urge urinary incontinence Improved with myrbetriq. Continue to follow with urology. If continues to contribute to dizziness, could consider half dose.   Neck muscle spasm Likely 2/2 sleeping wrong. No red flags. Discussed pillows, massage, stretching. Handout on stretches provided.    URI Uncomplicated, COVID negative x2. Continue supportive care. Rx tessalon perles prn.  F/u 1-2 months for DM,  HTN.   Caro Laroche, DO

## 2023-04-17 NOTE — Assessment & Plan Note (Signed)
Likely 2/2 sleeping wrong. No red flags. Discussed pillows, massage, stretching. Handout on stretches provided.

## 2023-05-24 ENCOUNTER — Ambulatory Visit: Payer: BC Managed Care – PPO | Admitting: Family Medicine

## 2023-05-31 ENCOUNTER — Ambulatory Visit: Payer: BC Managed Care – PPO | Admitting: Family Medicine

## 2023-05-31 ENCOUNTER — Encounter: Payer: Self-pay | Admitting: Family Medicine

## 2023-05-31 VITALS — BP 124/54 | HR 61 | Ht 62.0 in | Wt 214.6 lb

## 2023-05-31 DIAGNOSIS — E1165 Type 2 diabetes mellitus with hyperglycemia: Secondary | ICD-10-CM

## 2023-05-31 DIAGNOSIS — I2510 Atherosclerotic heart disease of native coronary artery without angina pectoris: Secondary | ICD-10-CM

## 2023-05-31 DIAGNOSIS — R5383 Other fatigue: Secondary | ICD-10-CM | POA: Diagnosis not present

## 2023-05-31 DIAGNOSIS — Z23 Encounter for immunization: Secondary | ICD-10-CM

## 2023-05-31 DIAGNOSIS — J301 Allergic rhinitis due to pollen: Secondary | ICD-10-CM

## 2023-05-31 DIAGNOSIS — I10 Essential (primary) hypertension: Secondary | ICD-10-CM

## 2023-05-31 LAB — POCT GLYCOSYLATED HEMOGLOBIN (HGB A1C): HbA1c, POC (controlled diabetic range): 6.4 % (ref 0.0–7.0)

## 2023-05-31 MED ORDER — AZELASTINE HCL 0.1 % NA SOLN: NASAL | 12 refills | Status: DC

## 2023-05-31 NOTE — Patient Instructions (Signed)
Great to see you! Keep me updated on the snot situation.

## 2023-05-31 NOTE — Progress Notes (Signed)
    CHIEF COMPLAINT / HPI: #1.  Cough and large amount of snot and runny nose since 1 August.  As we had previously discussed, she was in [place] for a week or so and had no symptoms but when she returned, symptoms immediately came back.  Her cough is keeping her awake at night.  Nonproductive other than some clear sputum.  Nasal discharge is also clear.  No fevers or chills. 2.  He is not feeling quite as energetic as she thinks she should.  Do not know if this is related to symptoms discussed and #1 above, or something else is going on.  She wonders if she needs some blood work. 3.  Feels like they have finally gotten her blood pressure medicine and her bladder medicine at the appropriate dose that she is no longer feeling dizzy.  Still has some urinary symptoms but no worse on current dose than she was on higher dose. 4.  Obesity: Says she "fell off the wagon" a little bit over the last couple of weeks but plans to get back onto her eating plan.  She is also not been exercising as much. 5.  Diabetes mellitus: Aching her medicine regularly.  No episodes of low blood sugar. 6.  Wants to get flu shot today.  Would be interested in getting COVID-vaccine but does not want to get them on the same day.   PERTINENT  PMH / PSH: I have reviewed the patient's medications, allergies, past medical and surgical history, smoking status and updated in the EMR as appropriate.   OBJECTIVE:  BP (!) 124/54   Pulse 61   Ht 5\' 2"  (1.575 m)   Wt 214 lb 9.6 oz (97.3 kg)   LMP 10/10/2010   SpO2 98%   BMI 39.25 kg/m  GENERAL: Well-developed, no acute distress PSYCH: AxOx4. Good eye contact.. No psychomotor retardation or agitation. Appropriate speech fluency and content. Asks and answers questions appropriately. Mood is congruent. Respiratory: No increased work of breathing. NECK: No lymphadenopathy.  ASSESSMENT / PLAN:   Type 2 diabetes mellitus with hyperglycemia, without long-term current use of insulin  (HCC) Recheck A1c today.  Continue current medications.  Encouraged to get back on her diet and exercise plan.  For her associated malaise and fatigue I will check some additional labs today including CBC, TSH, CMP.  HYPERTENSION, BENIGN SYSTEMIC Blood pressure control looks great.  She is asymptomatic with no dizziness, no lightheadedness.  Continue this medication regimen, check CMP  ALLERGIC RHINITIS, SEASONAL It sounds like her cough and nasal discharge are related to allergies since she had some resolution while she was out of state.  She has been taking OTC Claritin and I will continue that.  She is also been taking Flonase intermittently, would move that to daily.  Would also add azelastine nasal spray.  If she is not having some improvement in the next week or so, would consider imaging sinuses, perhaps chest x-ray, versus round of prednisone.  She said she had allergy testing many many years ago and she does not want to go back through that.   Denny Levy MD

## 2023-05-31 NOTE — Assessment & Plan Note (Signed)
Blood pressure control looks great.  She is asymptomatic with no dizziness, no lightheadedness.  Continue this medication regimen, check CMP

## 2023-05-31 NOTE — Assessment & Plan Note (Signed)
It sounds like her cough and nasal discharge are related to allergies since she had some resolution while she was out of state.  She has been taking OTC Claritin and I will continue that.  She is also been taking Flonase intermittently, would move that to daily.  Would also add azelastine nasal spray.  If she is not having some improvement in the next week or so, would consider imaging sinuses, perhaps chest x-ray, versus round of prednisone.  She said she had allergy testing many many years ago and she does not want to go back through that.

## 2023-05-31 NOTE — Assessment & Plan Note (Signed)
Recheck A1c today.  Continue current medications.  Encouraged to get back on her diet and exercise plan.  For her associated malaise and fatigue I will check some additional labs today including CBC, TSH, CMP.

## 2023-06-01 LAB — BASIC METABOLIC PANEL
BUN/Creatinine Ratio: 16 (ref 12–28)
BUN: 20 mg/dL (ref 8–27)
CO2: 23 mmol/L (ref 20–29)
Calcium: 9.6 mg/dL (ref 8.7–10.3)
Chloride: 102 mmol/L (ref 96–106)
Creatinine, Ser: 1.26 mg/dL — ABNORMAL HIGH (ref 0.57–1.00)
Glucose: 109 mg/dL — ABNORMAL HIGH (ref 70–99)
Potassium: 4.5 mmol/L (ref 3.5–5.2)
Sodium: 140 mmol/L (ref 134–144)
eGFR: 47 mL/min/{1.73_m2} — ABNORMAL LOW (ref >59)

## 2023-06-01 LAB — CBC

## 2023-06-01 LAB — TSH: TSH: 3.47 u[IU]/mL (ref 0.450–4.500)

## 2023-06-08 ENCOUNTER — Encounter: Payer: Self-pay | Admitting: Family Medicine

## 2023-06-21 ENCOUNTER — Ambulatory Visit (HOSPITAL_COMMUNITY)
Admission: RE | Admit: 2023-06-21 | Discharge: 2023-06-21 | Disposition: A | Discharge status: Home / Self Care | Payer: BC Managed Care – PPO | Source: Ambulatory Visit | Attending: Family Medicine | Admitting: Family Medicine

## 2023-06-21 ENCOUNTER — Ambulatory Visit (INDEPENDENT_AMBULATORY_CARE_PROVIDER_SITE_OTHER): Payer: BC Managed Care – PPO | Admitting: Family Medicine

## 2023-06-21 ENCOUNTER — Other Ambulatory Visit: Payer: Self-pay | Admitting: Family Medicine

## 2023-06-21 ENCOUNTER — Encounter: Payer: Self-pay | Admitting: Family Medicine

## 2023-06-21 VITALS — BP 134/66 | HR 70 | Ht 62.0 in | Wt 217.0 lb

## 2023-06-21 DIAGNOSIS — R609 Edema, unspecified: Secondary | ICD-10-CM | POA: Diagnosis present

## 2023-06-21 DIAGNOSIS — Z23 Encounter for immunization: Secondary | ICD-10-CM | POA: Diagnosis not present

## 2023-06-21 DIAGNOSIS — I10 Essential (primary) hypertension: Secondary | ICD-10-CM

## 2023-06-21 DIAGNOSIS — R0609 Other forms of dyspnea: Secondary | ICD-10-CM

## 2023-06-21 NOTE — Patient Instructions (Addendum)
It was wonderful to see you today.  Please bring ALL of your medications with you to every visit.   Today we talked about:  - Checking blood work  -Getting a heart ultrasound  - I will message you with results tomorrow    An x-ray was ordered for you---you do not need an appointment to have this completed.  I recommend going to [facility] [address] [place]  If the results are normal,I will send you a letter  I will call you with results if anything is abnormal    Please follow up in 1 months  Follow up with Dr. Jennette Kettle or Me in 1 month   .[email or web address] Thank you for choosing [facility].   Please call [phone] with any questions about today's appointment.  Please be sure to schedule follow up at the front  desk before you leave today.   Terisa Starr, MD  Family Medicine

## 2023-06-21 NOTE — Progress Notes (Signed)
    SUBJECTIVE:   CHIEF COMPLAINT: edema HPI:   Pamela Thomas is a 67 y.o.  with history notable for type 2 DM, CAD s/p CABG and HTN presenting for feeling off.  She reports she has had worsening and ongoing fatigue for the past month or more.  She reports she just feels like she has no energy during the day.   She reports over the past month or more she has had worsening dyspnea on exertion.  She additionally reports significant weight gain despite dietary changes.  She denies fevers, nausea, vomiting, melena, hematochezia, over-the-counter medication use, chest pain, palpitations, orthopnea, or jaw pain.  She has had an MI in the past and her symptoms at that time were jaw pain.  She noticed significant swelling of her wrists and ankles earlier this week and took a few pills of her Lasix.  This resulted in significant diuresis she reports She recently saw Dr. Jennette Kettle. Reported some fatigue, just not feeling her self. Has had issues with dizziness in the past.  She has had metoprolol reduced due to frequent falls as well as lower blood pressure with weight loss.   Last PSH/Family/Social History : Showed mild aortic valve stenosis, normal ejection fraction, mild LVH and some elevated pulmonary pressures.  OBJECTIVE:   BP 134/66   Pulse 70   Ht 5\' 2"  (1.575 m)   Wt 217 lb (98.4 kg)   LMP 10/10/2010   SpO2 98%   BMI 39.69 kg/m   Today's weight:  Last Weight  Most recent update: 06/21/2023  2:39 PM    Weight  98.4 kg (217 lb)            Review of prior weights: [employer]   06/21/23 1439  Weight: 217 lb (98.4 kg)    Ambulated easily around clinic maintaining pulse ox greater than 98%.. Regular rate and rhythm.  She has a 3 out of 6 systolic murmur appreciated throughout the precordium but best at the right upper sternal border.  Lungs diminished breath sounds but are clear bilaterally she has normal effort and is not tachypneic.  Abdomen is soft nontender unable to  appreciate fluid wave.  She has 1+ lower extremity edema.  ASSESSMENT/PLAN:   Assessment & Plan Edema, unspecified type Concern this could be progression of her chronic kidney disease or onset of heart failure or worsening for valvular heart disease -EKG today shows SR, similar anterior Q wave, no ST/T wave changes, no new findings from 2023  -Echocardiogram ordered -CBC, CMP urine protein to creatinine ratio -BNP today -Will check vitamin B12 as she reports this was low in the past and contributing to her fatigue. - Could consider event monitor in the future - She has already reached out to Cardiology  For fatigue---Could consider PMR if above tests nondiagnostic, sleep apnea Dyspnea on exertion Differential is broad and includes allergies although I think this is unlikely to cause the degree of dyspnea she is describing, progression of valvular disease, heart failure, or pneumonia. I think this is less likely a pulmonary embolism given the slow onset.  Will obtain chest x-ray and CBC for anemia.  Remainder of evaluation as above for edema.     Terisa Starr, MD  Family Medicine Teaching Service  [facility]

## 2023-06-23 ENCOUNTER — Telehealth: Payer: Self-pay | Admitting: Family Medicine

## 2023-06-23 LAB — COMPREHENSIVE METABOLIC PANEL
ALT: 58 [IU]/L — ABNORMAL HIGH (ref 0–32)
AST: 67 [IU]/L — ABNORMAL HIGH (ref 0–40)
Albumin: 4.1 g/dL (ref 3.9–4.9)
Alkaline Phosphatase: 88 [IU]/L (ref 44–121)
BUN/Creatinine Ratio: 16 (ref 12–28)
BUN: 18 mg/dL (ref 8–27)
Bilirubin Total: 0.3 mg/dL (ref 0.0–1.2)
CO2: 25 mmol/L (ref 20–29)
Calcium: 9.9 mg/dL (ref 8.7–10.3)
Chloride: 100 mmol/L (ref 96–106)
Creatinine, Ser: 1.16 mg/dL — ABNORMAL HIGH (ref 0.57–1.00)
Globulin, Total: 2.3 g/dL (ref 1.5–4.5)
Glucose: 114 mg/dL — ABNORMAL HIGH (ref 70–99)
Potassium: 3.8 mmol/L (ref 3.5–5.2)
Sodium: 141 mmol/L (ref 134–144)
Total Protein: 6.4 g/dL (ref 6.0–8.5)
eGFR: 52 mL/min/{1.73_m2} — ABNORMAL LOW (ref >59)

## 2023-06-23 LAB — CBC
Hematocrit: 39.5 % (ref 34.0–46.6)
Hemoglobin: 11.9 g/dL (ref 11.1–15.9)
MCH: 27.4 pg (ref 26.6–33.0)
MCHC: 30.1 g/dL — ABNORMAL LOW (ref 31.5–35.7)
MCV: 91 fL (ref 79–97)
Platelets: 236 10*3/uL (ref 150–450)
RBC: 4.34 x10E6/uL (ref 3.77–5.28)
RDW: 14.5 % (ref 11.7–15.4)
WBC: 7.6 10*3/uL (ref 3.4–10.8)

## 2023-06-23 LAB — VITAMIN B12: Vitamin B-12: 587 pg/mL (ref 232–1245)

## 2023-06-23 LAB — PROTEIN / CREATININE RATIO, URINE
Creatinine, Urine: 380.9 mg/dL
Protein, Ur: 144.8 mg/dL
Protein/Creat Ratio: 380 mg/g{creat} — ABNORMAL HIGH (ref 0–200)

## 2023-06-23 LAB — BRAIN NATRIURETIC PEPTIDE: BNP: 132.6 pg/mL — ABNORMAL HIGH (ref 0.0–100.0)

## 2023-06-23 NOTE — Telephone Encounter (Signed)
Called to discuss results.  Her ALT and AST continue be slightly elevated.  Consider right upper quadrant ultrasound and hepatic function testing at follow-up.  Likely due to metabolic associated fatty liver disease.  Kidney function is below baseline.  All other labs appear to be appropriate.  Scheduled follow-up with Dr. Jennette Kettle to discuss fatigue.  Nursing will be scheduling her echocardiogram.

## 2023-07-12 ENCOUNTER — Ambulatory Visit: Payer: BC Managed Care – PPO | Admitting: Family Medicine

## 2023-07-12 ENCOUNTER — Encounter: Payer: Self-pay | Admitting: Family Medicine

## 2023-07-12 VITALS — BP 141/65 | HR 75 | Ht 62.0 in | Wt 220.0 lb

## 2023-07-12 DIAGNOSIS — N3946 Mixed incontinence: Secondary | ICD-10-CM

## 2023-07-12 DIAGNOSIS — R400 Somnolence: Secondary | ICD-10-CM | POA: Diagnosis not present

## 2023-07-12 DIAGNOSIS — N182 Chronic kidney disease, stage 2 (mild): Secondary | ICD-10-CM | POA: Diagnosis not present

## 2023-07-12 DIAGNOSIS — E1165 Type 2 diabetes mellitus with hyperglycemia: Secondary | ICD-10-CM | POA: Diagnosis not present

## 2023-07-12 DIAGNOSIS — R5383 Other fatigue: Secondary | ICD-10-CM

## 2023-07-12 NOTE — Patient Instructions (Signed)
Please let me know in 2 weeks (MyChart or phone) about you fatigue  Also you should hear from the two referrals I Have placed in about that timeWe will discuss f/u when we talk

## 2023-07-13 NOTE — Assessment & Plan Note (Signed)
Her diabetes is pretty well-controlled so if we have to stop this we will need to look at alternatives.

## 2023-07-13 NOTE — Assessment & Plan Note (Signed)
Has been seen by urology.  They started her on might be treated which she thought might have helped a little bit in the beginning but does not seem to help much now.  She wonders if she could do a trial off that and see and see if she can get a better understanding of whether or not it was helping.  If not, or other, or other other options for medication.  She would rather not go back to urology again if she could avoid it.

## 2023-07-13 NOTE — Progress Notes (Signed)
    CHIEF COMPLAINT / HPI: Continued issues with malaise and feeling "just not quite right".  That she just feels like she does not want to do anything.  He is not depressed and mentally she would like to be involved in things but from a physical standpoint does not really have the energy to do that.  Has scheduled her echocardiogram which was ordered at last office visit for similar complaints.  Notably she has continued on Ozempic.  She is unhappy because she has not lost weight.  Blood sugars seem pretty controlled however last A1c 1 month ago was 6.4.   PERTINENT  PMH / PSH: I have reviewed the patient's medications, allergies, past medical and surgical history, smoking status and updated in the EMR as appropriate. Reviewed labs from last office visit on September 18 and October 9 including hemoglobin A1c of 6.4, normal TSH,Stable creatinine at 1.16.  Protein creatinine ratio 380  OBJECTIVE:  BP (!) 141/65   Pulse 75   Ht 5\' 2"  (1.575 m)   Wt 220 lb (99.8 kg)   LMP 10/10/2010   SpO2 99%   BMI 40.24 kg/m  Vital signs reviewed. GENERAL: Well-developed, well-nourished, no acute distress.  Does not look like she feels well and is slightly pale. CARDIOVASCULAR: Regular rate and rhythm no murmur gallop or rub LUNGS: Clear to auscultation bilaterally, no rales or wheeze. ABDOMEN: Soft positive bowel sounds NEURO: No gross focal neurological deficits.  Normal lower extremity strength and she can rise from a chair without any assistance.  Normal gait.   PSYCH: AxOx4. Good eye contact.. No psychomotor retardation or agitation. Appropriate speech fluency and content. Asks and answers questions appropriately. Mood is congruent. MSK: Movement of extremity x 4.   ASSESSMENT / PLAN:  #1.  Malaise and fatigue continues: Reviewed previous labs.  His labs.  Starting to wonder if this could be related to her medication, Ozempic.  We decided to discontinue it for 2 to 3 weeks.  She will update me on  how she feels after about 2 weeks.  Additionally I do think we need to set her up for sleep study.  We have discussed this many times before but she is always been resistant because she has such anxiety that even considering the thought of being treated with CPAP makes her anxious.  We discussed at length that there are other ways to deliver oxygen then CPAP mask and that right now the most important thing is to get the diagnosis so she agrees.  Referral is made. Type 2 diabetes mellitus with hyperglycemia, without long-term current use of insulin (HCC) Her diabetes is pretty well-controlled so if we have to stop this we will need to look at alternatives.  CKD (chronic kidney disease), stage II Reviewed her lab work and her EGFR is stable.  She is already hooked up with nephrology.  Will continue her current medicines.  Mixed stress and urge urinary incontinence Has been seen by urology.  They started her on might be treated which she thought might have helped a little bit in the beginning but does not seem to help much now.  She wonders if she could do a trial off that and see and see if she can get a better understanding of whether or not it was helping.  If not, or other, or other other options for medication.  She would rather not go back to urology again if she could avoid it.   Denny Levy MD

## 2023-07-13 NOTE — Assessment & Plan Note (Signed)
Reviewed her lab work and her EGFR is stable.  She is already hooked up with nephrology.  Will continue her current medicines.

## 2023-07-18 ENCOUNTER — Telehealth: Payer: Self-pay | Admitting: Family Medicine

## 2023-07-18 NOTE — Telephone Encounter (Signed)
Called to scheduled Dermatology appointment. LVM, if patient calls back please schedule her in dermatology clinic remember to set time to . Any questions, please see me.   Appointment Notes: Aks and skin tags removed .

## 2023-07-20 ENCOUNTER — Ambulatory Visit (HOSPITAL_COMMUNITY)
Admission: RE | Admit: 2023-07-20 | Discharge: 2023-07-20 | Disposition: A | Discharge status: Home / Self Care | Payer: BC Managed Care – PPO | Source: Ambulatory Visit | Attending: Family Medicine | Admitting: Family Medicine

## 2023-07-20 DIAGNOSIS — E785 Hyperlipidemia, unspecified: Secondary | ICD-10-CM | POA: Diagnosis not present

## 2023-07-20 DIAGNOSIS — I10 Essential (primary) hypertension: Secondary | ICD-10-CM | POA: Insufficient documentation

## 2023-07-20 DIAGNOSIS — E119 Type 2 diabetes mellitus without complications: Secondary | ICD-10-CM | POA: Diagnosis not present

## 2023-07-20 DIAGNOSIS — I080 Rheumatic disorders of both mitral and aortic valves: Secondary | ICD-10-CM | POA: Diagnosis not present

## 2023-07-20 DIAGNOSIS — R0600 Dyspnea, unspecified: Secondary | ICD-10-CM | POA: Insufficient documentation

## 2023-07-20 DIAGNOSIS — I2510 Atherosclerotic heart disease of native coronary artery without angina pectoris: Secondary | ICD-10-CM | POA: Insufficient documentation

## 2023-07-20 DIAGNOSIS — R0609 Other forms of dyspnea: Secondary | ICD-10-CM

## 2023-07-20 LAB — ECHOCARDIOGRAM COMPLETE
AR max vel: 1.15 cm2
AV Area VTI: 1.09 cm2
AV Area mean vel: 1.07 cm2
AV Mean grad: 10.2 mm[Hg]
AV Peak grad: 17.4 mm[Hg]
AV Vena cont: 0.2 cm
Ao pk vel: 2.08 m/s
Area-P 1/2: 3.31 cm2
Calc EF: 58.8 %
MV M vel: 5.14 m/s
MV Peak grad: 105.7 mm[Hg]
P 1/2 time: 491 ms
Radius: 0.4 cm
S' Lateral: 2.4 cm
Single Plane A2C EF: 63 %
Single Plane A4C EF: 55.7 %

## 2023-07-21 ENCOUNTER — Telehealth: Payer: Self-pay | Admitting: Family Medicine

## 2023-07-21 NOTE — Telephone Encounter (Signed)
Called and discussed echo results. Mildly elevated pulmonary pressures. Likely related to untreated OSA. Could consider Pulmonary referral in future if symptoms not improving.  Terisa Starr, MD  Family Medicine Teaching Service

## 2023-07-24 DIAGNOSIS — M25531 Pain in right wrist: Secondary | ICD-10-CM | POA: Insufficient documentation

## 2023-07-24 DIAGNOSIS — M65332 Trigger finger, left middle finger: Secondary | ICD-10-CM | POA: Insufficient documentation

## 2023-07-24 DIAGNOSIS — M25532 Pain in left wrist: Secondary | ICD-10-CM | POA: Insufficient documentation

## 2023-07-27 ENCOUNTER — Ambulatory Visit: Payer: BC Managed Care – PPO

## 2023-07-27 VITALS — BP 145/75 | HR 78 | Ht 62.0 in | Wt 221.8 lb

## 2023-07-27 DIAGNOSIS — L570 Actinic keratosis: Secondary | ICD-10-CM | POA: Insufficient documentation

## 2023-07-27 NOTE — Progress Notes (Signed)
    SUBJECTIVE:   CHIEF COMPLAINT / HPI:  Patient presents for concern of skin lesions on back of shoulders and legs. Patients brother died from malignant melanoma.   PERTINENT  PMH / PSH:  - HTN - DM II  OBJECTIVE:   BP (!) 145/75   Pulse 78   Ht 5\' 2"  (1.575 m)   Wt 221 lb 12.8 oz (100.6 kg)   LMP 10/10/2010   SpO2 95%   BMI 40.57 kg/m    General: A&O, NAD Psych: Appropriate mood and affect Skin: multiple solar lentigines along bilateral shoulders, back, chest and legs. Scattered scaly papules with regular borders and erythema consistent with actinic keratoses.       ASSESSMENT/PLAN:   Actinic keratoses Diagnosis: Actinic keratoses  Procedure: Cryotherapy Location: Left leg, right leg   After discussion of the risks, benefits, and alternative therapies available, the patient elected to proceed. After obtaining written informed consent, the patient's identity, procedure, and site were verified during a time out prior to proceeding procedure. The lesions on the right and left lower leg were treated using liquid nitrogen spray gun for 3-4  second per cycle, 3 cycles total. The patient tolerated the procedure well and there were no immediate complications.  Patient was provided aftercare handout and advised to return if lesion(s) did not fully resolved.    Hal Morales, MD [facility]

## 2023-07-27 NOTE — Assessment & Plan Note (Signed)
Diagnosis: Actinic keratoses  Procedure: Cryotherapy Location: Left leg, right leg   After discussion of the risks, benefits, and alternative therapies available, the patient elected to proceed. After obtaining written informed consent, the patient's identity, procedure, and site were verified during a time out prior to proceeding procedure. The lesions on the right and left lower leg were treated using liquid nitrogen spray gun for 3-4  second per cycle, 3 cycles total. The patient tolerated the procedure well and there were no immediate complications.  Patient was provided aftercare handout and advised to return if lesion(s) did not fully resolved.

## 2023-07-27 NOTE — Patient Instructions (Signed)
It was wonderful to see you today.  Please bring ALL of your medications with you to every visit.   Today we talked about:  Your precancerous skin lesions. We used cryotherapy (freezing) today. Please continue to apply vaseline to the areas we froze for 2-3 days or until healed. Please do not shave those areas for 1-2 months. Return for any new thickening lesions.  Thank you for choosing [facility].   Please call [phone] with any questions about today's appointment.  Please arrive at least 15 minutes prior to your scheduled appointments.   If you had blood work today, I will send you a MyChart message or a letter if results are normal. Otherwise, I will give you a call.   If you had a referral placed, they will call you to set up an appointment. Please give [name] a call if you don't hear back in the next 2 weeks.   If you need additional refills before your next appointment, please call your pharmacy first.   Hal Morales, MD Family Medicine

## 2023-08-14 ENCOUNTER — Encounter: Payer: Self-pay | Admitting: Family Medicine

## 2023-08-15 ENCOUNTER — Encounter: Payer: Self-pay | Admitting: Family Medicine

## 2023-08-15 ENCOUNTER — Other Ambulatory Visit: Payer: Self-pay | Admitting: Family Medicine

## 2023-08-15 MED ORDER — TROSPIUM CHLORIDE 20 MG PO TABS: 20.0000 mg | ORAL_TABLET | Freq: Two times a day (BID) | ORAL | 1 refills | Status: DC

## 2023-08-15 MED ORDER — TRULICITY 0.75 MG/0.5ML ~~LOC~~ SOAJ: 0.7500 mg | SUBCUTANEOUS | 1 refills | Status: DC

## 2023-08-15 NOTE — Progress Notes (Signed)
Reviewed her notes from urology. Their thought was possibly overactive bladder, likely with poor pelvic floor support. She has not had great effext from Mybetriq. Will try short acting form of Trospium as it has no interactions with CYP450.

## 2023-09-15 ENCOUNTER — Other Ambulatory Visit: Payer: Self-pay | Admitting: Family Medicine

## 2023-09-19 ENCOUNTER — Other Ambulatory Visit: Payer: Self-pay | Admitting: Family Medicine

## 2023-09-19 DIAGNOSIS — I10 Essential (primary) hypertension: Secondary | ICD-10-CM

## 2023-09-20 ENCOUNTER — Other Ambulatory Visit: Payer: Self-pay | Admitting: Family Medicine

## 2023-09-28 ENCOUNTER — Other Ambulatory Visit: Payer: Self-pay | Admitting: *Deleted

## 2023-09-28 DIAGNOSIS — I6523 Occlusion and stenosis of bilateral carotid arteries: Secondary | ICD-10-CM

## 2023-10-20 ENCOUNTER — Other Ambulatory Visit: Payer: Self-pay | Admitting: Family Medicine

## 2023-10-25 DIAGNOSIS — G4733 Obstructive sleep apnea (adult) (pediatric): Secondary | ICD-10-CM | POA: Diagnosis not present

## 2023-10-31 DIAGNOSIS — G4733 Obstructive sleep apnea (adult) (pediatric): Secondary | ICD-10-CM | POA: Diagnosis not present

## 2023-11-09 ENCOUNTER — Inpatient Hospital Stay (HOSPITAL_COMMUNITY): Admission: RE | Admit: 2023-11-09 | Payer: BC Managed Care – PPO | Source: Ambulatory Visit

## 2023-11-20 ENCOUNTER — Other Ambulatory Visit: Payer: Self-pay | Admitting: Family Medicine

## 2023-11-20 DIAGNOSIS — Z1231 Encounter for screening mammogram for malignant neoplasm of breast: Secondary | ICD-10-CM

## 2023-11-24 ENCOUNTER — Ambulatory Visit (HOSPITAL_COMMUNITY)
Admission: RE | Admit: 2023-11-24 | Discharge: 2023-11-24 | Disposition: A | Discharge status: Home / Self Care | Payer: BC Managed Care – PPO | Source: Ambulatory Visit | Attending: Cardiology | Admitting: Cardiology

## 2023-11-24 DIAGNOSIS — I6523 Occlusion and stenosis of bilateral carotid arteries: Secondary | ICD-10-CM | POA: Insufficient documentation

## 2023-11-28 ENCOUNTER — Other Ambulatory Visit: Payer: Self-pay | Admitting: *Deleted

## 2023-11-28 DIAGNOSIS — I6523 Occlusion and stenosis of bilateral carotid arteries: Secondary | ICD-10-CM

## 2023-11-30 ENCOUNTER — Telehealth: Payer: Self-pay | Admitting: Family Medicine

## 2023-12-01 NOTE — Telephone Encounter (Signed)
 Need to set up Vascular for carotid stenosis

## 2023-12-03 ENCOUNTER — Other Ambulatory Visit: Payer: Self-pay | Admitting: Family Medicine

## 2023-12-05 ENCOUNTER — Ambulatory Visit
Admission: RE | Admit: 2023-12-05 | Discharge: 2023-12-05 | Disposition: A | Discharge status: Home / Self Care | Source: Ambulatory Visit | Attending: Family Medicine | Admitting: Family Medicine

## 2023-12-05 DIAGNOSIS — Z1231 Encounter for screening mammogram for malignant neoplasm of breast: Secondary | ICD-10-CM | POA: Diagnosis not present

## 2023-12-05 NOTE — Telephone Encounter (Signed)
 Cards already did set up and sent referral

## 2023-12-07 ENCOUNTER — Encounter: Payer: Self-pay | Admitting: Vascular Surgery

## 2023-12-07 ENCOUNTER — Ambulatory Visit: Admitting: Vascular Surgery

## 2023-12-07 VITALS — BP 144/83 | HR 74 | Temp 98.2°F | Resp 20 | Ht 62.0 in | Wt 223.3 lb

## 2023-12-07 DIAGNOSIS — I6522 Occlusion and stenosis of left carotid artery: Secondary | ICD-10-CM

## 2023-12-07 NOTE — Progress Notes (Unsigned)
 Office Note     CC: Asymptomatic carotid disease Requesting Provider:  Rollene Rotunda, MD  HPI: Pamela Thomas is a 68 y.o. ([birth year]) female presenting at the request of .Nestor Ramp, MD with recent ultrasound demonstrating left-sided critical ICA stenosis.  On exam, send he was doing well.  A native of [place], she graduated from [employer], but currently lives in the [address] with her husband.  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  In retirement, her and her husband have enjoyed traveling.  She recently got back from [place], with plans to return in the coming weeks.  While on her trip in [place], she was called by her cardiologist, Dr. Antoine Poche who appreciated cortical left-sided ICA stenosis on recent ultrasound.  The ultrasound was a follow-up study as she has had moderate stenosis since initially tested prior to CABG.  She denies symptoms of TIA, stroke, amaurosis recently, but stated years ago, she thought that she had a TIA.  The pt is  on a statin for cholesterol management.  The pt is  on a daily aspirin.   Other AC:  - The pt is  on medication for hypertension.   The pt is  diabetic.  Tobacco hx:  -  Past Medical History:  Diagnosis Date   ALLERGIC RHINITIS, SEASONAL    Anxiety    Back pain    BCC (basal cell carcinoma of skin) 2005   Nose [facility])   Carotid artery occlusion    Constipation    Coronary artery disease    Depression    Diabetes mellitus without complication (HCC)    DYSLIPIDEMIA    Dysrhythmia    palpitations-evaluated by Dr Eden Emms 6/12/eccho 6/13   Glucose intolerance (impaired glucose tolerance)    History of blood transfusion    Hypertension    Joint pain    OBESITY, NOS    Osteoarthritis    rt knee   PLANTAR FASCIITIS, BILATERAL    Vertigo    Vitamin D deficiency     Past Surgical History:  Procedure Laterality Date   BREAST BIOPSY  05/27/03   left   CARDIAC  CATHETERIZATION     CARPAL TUNNEL RELEASE  05/27/03   Right   CESAREAN SECTION  05/27/03   CORONARY ARTERY BYPASS GRAFT N/A 08/17/2018   Procedure: CORONARY ARTERY BYPASS GRAFTING (CABG) TIMES TWO: LIMA to LAD, SVG to RAMUS INTERMEDIATE)  USING LEFT INTERNAL MAMMARY ARTERY AND RIGHT GREATER SAPHENOUS VEIN HARVESTED ENDOSCOPICALLY.;  Surgeon: Delight Ovens, MD;  Location: [facility] OR;  Service: Open Heart Surgery;  Laterality: N/A;   KNEE SURGERY     left- removal fatty tumor   ROTATOR CUFF REPAIR     Bilateral   TEE WITHOUT CARDIOVERSION N/A 08/17/2018   Procedure: TRANSESOPHAGEAL ECHOCARDIOGRAM (TEE);  Surgeon: Delight Ovens, MD;  Location: [facility] OR;  Service: Open Heart Surgery;  Laterality: N/A;   TOTAL KNEE ARTHROPLASTY  10/10/2012   Procedure: TOTAL KNEE ARTHROPLASTY;  Surgeon: Loanne Drilling, MD;  Location: [facility] ORS;  Service: Orthopedics;  Laterality: Right;    Social History   Socioeconomic History   Marital status: Married    Spouse name: Pamela Thomas   Number of children: 2   Years of education: Not on file   Highest education level: 12th grade  Occupational History   Occupation: retired  Tobacco Use   Smoking status: Former    Current packs/day: 0.00    Types: Cigarettes    Quit  date: 09/12/1990    Years since quitting: 33.2   Smokeless tobacco: Never  Vaping Use   Vaping status: Never Used  Substance and Sexual Activity   Alcohol use: Yes    Alcohol/week: 0.0 standard drinks of alcohol    Comment: rare   Drug use: No   Sexual activity: Not on file  Other Topics Concern   Not on file  Social History Narrative   Lives with husband in a split level home.  Has 3 children.     Recently Retired   Highest level of education:  12th grade   Social Drivers of Corporate investment banker Strain: Low Risk  (07/08/2023)   Overall Financial Resource Strain (CARDIA)    Difficulty of Paying Living Expenses: Not hard at all  Food Insecurity: No Food Insecurity (07/08/2023)   Hunger  Vital Sign    Worried About Running Out of Food in the Last Year: Never true    Ran Out of Food in the Last Year: Never true  Transportation Needs: No Transportation Needs (07/08/2023)   PRAPARE - Administrator, Civil Service (Medical): No    Lack of Transportation (Non-Medical): No  Physical Activity: Insufficiently Active (07/08/2023)   Exercise Vital Sign    Days of Exercise per Week: 3 days    Minutes of Exercise per Session: 10 min  Stress: No Stress Concern Present (07/08/2023)   [employer] of Occupational Health - Occupational Stress Questionnaire    Feeling of Stress : Not at all  Social Connections: Socially Integrated (07/08/2023)   Social Connection and Isolation Panel [NHANES]    Frequency of Communication with Friends and Family: More than three times a week    Frequency of Social Gatherings with Friends and Family: Once a week    Attends Religious Services: More than 4 times per year    Active Member of [employer] or Organizations: Yes    Attends Engineer, structural: More than 4 times per year    Marital Status: Married  Catering manager Violence: Not on file   Family History  Problem Relation Age of Onset   COPD Mother        Deceased   High blood pressure Mother    High Cholesterol Mother    Depression Mother    Lung cancer Father        Deceaesd   High Cholesterol Father    Diabetes Other    Melanoma Brother        Deceased   COPD Brother    Healthy Daughter     Current Outpatient Medications  Medication Sig Dispense Refill   acetaminophen (TYLENOL) 500 MG tablet Take 500 mg by mouth every 6 (six) hours as needed for moderate pain.      allopurinol (ZYLOPRIM) 100 MG tablet TAKE 1 TABLET BY MOUTH EVERY DAY 90 tablet 3   aspirin EC (ASPIRIN LOW DOSE) 81 MG tablet TAKE 1 TABLET BY MOUTH EVERY DAY 100 tablet 3   candesartan (ATACAND) 16 MG tablet TAKE 1 TABLET BY MOUTH EVERY DAY 30 tablet 11   colchicine 0.6 MG tablet TAKE 1 TABLET  BY MOUTH EVERY DAY 90 tablet 3   Dulaglutide (TRULICITY) 0.75 MG/0.5ML SOAJ INJECT 0.75 MG SUBCUTANEOUSLY ONE TIME PER WEEK 6 mL 1   ezetimibe (ZETIA) 10 MG tablet TAKE 1 TABLET BY MOUTH EVERY DAY 30 tablet 11   FLUoxetine (PROZAC) 20 MG tablet TAKE 1 TABLET BY MOUTH EVERY DAY 90 tablet 3  metFORMIN (GLUCOPHAGE) 1000 MG tablet TAKE 1 TABLET BY MOUTH TWICE A DAY WITH FOOD 180 tablet 3   metoprolol succinate (TOPROL-XL) 25 MG 24 hr tablet TAKE 1 TABLET BY MOUTH IN THE MORNING AND AT BEDTIME 180 tablet 3   rosuvastatin (CRESTOR) 40 MG tablet TAKE 1 TABLET BY MOUTH EVERY DAY 30 tablet 11   trospium (SANCTURA) 20 MG tablet TAKE 1 TABLET BY MOUTH TWICE A DAY 180 tablet 1   azelastine (ASTELIN) 0.1 % nasal spray One spray each nostril daily as directed (Patient not taking: Reported on 12/07/2023) 30 mL 12   hydrOXYzine (ATARAX) 10 MG tablet Take 1 tablet (10 mg total) by mouth 3 (three) times daily as needed. (Patient not taking: Reported on 12/07/2023) 30 tablet 0   nitroGLYCERIN (NITROSTAT) 0.4 MG SL tablet Place 1 tablet (0.4 mg total) under the tongue every 5 (five) minutes as needed for chest pain. 25 tablet 3   No current facility-administered medications for this visit.    Allergies  Allergen Reactions   Icosapent Ethyl (Epa Ethyl Ester) (Fish) Diarrhea   Statins     Myalgia pain moderate, flu like symptoms.    Morphine Sulfate     Severe hallucinations, difficult arousal.    Oxycodone Nausea Only    Has to take nausea medications if taken. Prefer not to take     REVIEW OF SYSTEMS:  [X]  denotes positive finding, [ ]  denotes negative finding Cardiac  Comments:  Chest pain or chest pressure:    Shortness of breath upon exertion:    Short of breath when lying flat:    Irregular heart rhythm:        Vascular    Pain in calf, thigh, or hip brought on by ambulation:    Pain in feet at night that wakes you up from your sleep:     Blood clot in your veins:    Leg swelling:          Pulmonary    Oxygen at home:    Productive cough:     Wheezing:         Neurologic    Sudden weakness in arms or legs:     Sudden numbness in arms or legs:     Sudden onset of difficulty speaking or slurred speech:    Temporary loss of vision in one eye:     Problems with dizziness:         Gastrointestinal    Blood in stool:     Vomited blood:         Genitourinary    Burning when urinating:     Blood in urine:        Psychiatric    Major depression:         Hematologic    Bleeding problems:    Problems with blood clotting too easily:        Skin    Rashes or ulcers:        Constitutional    Fever or chills:      PHYSICAL EXAMINATION:  Vitals:   12/07/23 1443 12/07/23 1446  BP: (!) 141/69 (!) 144/83  Pulse: 74   Resp: 20   Temp: 98.2 F (36.8 C)   TempSrc: Temporal   SpO2: (!) 74%   Weight: 223 lb 4.8 oz (101.3 kg)   Height: 5\' 2"  (1.575 m)     General:  WDWN in NAD; vital signs documented above Gait: Not observed HENT: WNL, normocephalic Pulmonary:  normal non-labored breathing , without wheezing Cardiac: regular HR Abdomen: soft, NT, no masses Skin: without rashes Vascular Exam/Pulses:  Right Left  Radial 2+ (normal) 2+ (normal)                       Extremities: without ischemic changes, without Gangrene , without cellulitis; without open wounds;  Musculoskeletal: no muscle wasting or atrophy  Neurologic: A&O X 3;  No focal weakness or paresthesias are detected Psychiatric:  The pt has Normal affect.   Non-Invasive Vascular Imaging:   Right Carotid Findings:  +----------+--------+--------+--------+-------------------------+--------+           PSV cm/sEDV cm/sStenosisPlaque Description       Comments  +----------+--------+--------+--------+-------------------------+--------+  CCA Prox  62      8                                                  +----------+--------+--------+--------+-------------------------+--------+   CCA Distal62      13                                                 +----------+--------+--------+--------+-------------------------+--------+  ICA Prox  125     29      1-39%   calcific and heterogenous          +----------+--------+--------+--------+-------------------------+--------+  ICA Mid   92      21                                                 +----------+--------+--------+--------+-------------------------+--------+  ICA Distal85      13                                                 +----------+--------+--------+--------+-------------------------+--------+  ECA      100     7                                                  +----------+--------+--------+--------+-------------------------+--------+   +----------+--------+-------+----------------+-------------------+           PSV cm/sEDV cmsDescribe        Arm Pressure (mmHG)  +----------+--------+-------+----------------+-------------------+  [id number]    0      Multiphasic, [id number]                  +----------+--------+-------+----------------+-------------------+   +---------+--------+--+--------+--+---------+  VertebralPSV cm/s35EDV cm/s10Antegrade  +---------+--------+--+--------+--+---------+      Left Carotid Findings:  +----------+--------+--------+--------+------------------+--------+           PSV cm/sEDV cm/sStenosisPlaque DescriptionComments  +----------+--------+--------+--------+------------------+--------+  CCA Prox  74      10                                          +----------+--------+--------+--------+------------------+--------+  CCA Distal54      11                                          +----------+--------+--------+--------+------------------+--------+  ICA Prox  339     120     80-99%  heterogenous                +----------+--------+--------+--------+------------------+--------+  ICA Mid   270     81                                           +----------+--------+--------+--------+------------------+--------+  ICA Distal119     30                                          +----------+--------+--------+--------+------------------+--------+  ECA      125     1                                           +----------+--------+--------+--------+------------------+--------+   +----------+--------+--------+----------------+-------------------+           PSV cm/sEDV cm/sDescribe        Arm Pressure (mmHG)  +----------+--------+--------+----------------+-------------------+  [id number]    0       Multiphasic, [id number]                  +----------+--------+--------+----------------+-------------------+   +---------+--------+--+--------+-+---------+  VertebralPSV cm/s31EDV cm/s7Antegrade  +---------+--------+--+--------+-+---------+     ASSESSMENT/PLAN: Pamela Thomas is a 68 y.o. female presenting with critical left sided ICA stenosis.  Fortunately, she is asymptomatic.  Pamela Thomas and I had a long discussion regarding asymptomatic internal carotid artery stenosis, most notably the risks and benefits of surgery.  She is aware that she has an 11% risk of stroke over the next 5 years, which can be reduced to 5% with cerebrovascular revascularization.  She is aware that there are multiple ways to revascularize, and that the neck step is CT angiogram of the head and neck in an effort to define the lesion further.  After discussing this, she was okay moving forward with CT angiogram.  My plan is to see her back in clinic to discuss the CT findings and surgical options.  We discussed the signs and symptoms of stroke, and asked that she seek medical attention immediately should any of these occur.  I have also referred her back to her cardiologist, Dr. Antoine Poche for preoperative risk assessment with plans for CEA versus TCAR.  Victorino Sparrow, MD [facility] [phone]

## 2023-12-13 ENCOUNTER — Other Ambulatory Visit: Payer: Self-pay

## 2023-12-13 DIAGNOSIS — I6522 Occlusion and stenosis of left carotid artery: Secondary | ICD-10-CM

## 2023-12-14 DIAGNOSIS — Z961 Presence of intraocular lens: Secondary | ICD-10-CM | POA: Diagnosis not present

## 2023-12-14 DIAGNOSIS — H35033 Hypertensive retinopathy, bilateral: Secondary | ICD-10-CM | POA: Diagnosis not present

## 2023-12-14 DIAGNOSIS — E113293 Type 2 diabetes mellitus with mild nonproliferative diabetic retinopathy without macular edema, bilateral: Secondary | ICD-10-CM | POA: Diagnosis not present

## 2023-12-14 DIAGNOSIS — H04123 Dry eye syndrome of bilateral lacrimal glands: Secondary | ICD-10-CM | POA: Diagnosis not present

## 2023-12-14 DIAGNOSIS — H26493 Other secondary cataract, bilateral: Secondary | ICD-10-CM | POA: Diagnosis not present

## 2023-12-14 LAB — HM DIABETES EYE EXAM

## 2023-12-21 ENCOUNTER — Encounter: Admitting: Vascular Surgery

## 2023-12-31 ENCOUNTER — Other Ambulatory Visit: Payer: Self-pay | Admitting: Family Medicine

## 2024-01-01 ENCOUNTER — Other Ambulatory Visit: Payer: Self-pay | Admitting: Family Medicine

## 2024-01-01 NOTE — Progress Notes (Signed)
 Received refill request for allopurinol . Has had some decline in eGFR so I am decreasing her dosing  to an approximate 75 mg a day (does not come in that dose so will alternate 1/2 tab and one whole tab)  Left voice mail and will send MyChart mssg

## 2024-01-08 ENCOUNTER — Ambulatory Visit
Admission: RE | Admit: 2024-01-08 | Discharge: 2024-01-08 | Disposition: A | Discharge status: Home / Self Care | Source: Ambulatory Visit | Attending: Vascular Surgery | Admitting: Vascular Surgery

## 2024-01-08 DIAGNOSIS — I6522 Occlusion and stenosis of left carotid artery: Secondary | ICD-10-CM

## 2024-01-08 MED ORDER — IOPAMIDOL (ISOVUE-370) INJECTION 76%
75.0000 mL | Freq: Once | INTRAVENOUS | Status: AC | PRN
Start: 2024-01-08 — End: 2024-01-08
  Administered 2024-01-08: 75 mL via INTRAVENOUS

## 2024-01-08 NOTE — Progress Notes (Signed)
 Office Note     CC: Asymptomatic carotid disease Requesting Provider:  Charise Companion, MD  HPI: Pamela Thomas is a 68 y.o. (11-29-1955) female presenting to discuss recent CT angiogram which was ordered due to asymptomatic left-sided critical ICA stenosis.    On exam, send he was doing well.  A native of [place], she graduated from [employer], but currently lives in the [address] with her husband.  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  In retirement, her and her husband have enjoyed traveling.  She has been to [place] twice in the last 2 months.  At her last trip, she came back with a cough.  This has improved with medication, but is still readily apparent on exam today.  She feels her lungs are clean, but when she starts coughing, she has difficulty stopping.  While on her first  trip in [place], she was called by her cardiologist, Dr. Lavonne Prairie who appreciated cortical left-sided ICA stenosis on recent ultrasound.  The ultrasound was a follow-up study as she has had moderate stenosis since initially tested prior to CABG.  She denies symptoms of TIA, stroke, amaurosis recently, but stated years ago, she thought that she had a TIA.  She is already booked a trip to [place]  this fall now that both her and her husband are retired.  The pt is  on a statin for cholesterol management.  The pt is  on a daily aspirin .   Other AC:  - The pt is  on medication for hypertension.   The pt is  diabetic.  Tobacco hx:  -  Past Medical History:  Diagnosis Date   ALLERGIC RHINITIS, SEASONAL    Anxiety    Back pain    BCC (basal cell carcinoma of skin) 2005   [facility])   Carotid artery occlusion    Constipation    Coronary artery disease    Depression    Diabetes mellitus without complication (HCC)    DYSLIPIDEMIA    Dysrhythmia    palpitations-evaluated by Dr Stann Earnest 6/12/eccho 6/13   Glucose intolerance (impaired glucose tolerance)     History of blood transfusion    Hypertension    Joint pain    OBESITY, NOS    Osteoarthritis    rt knee   PLANTAR FASCIITIS, BILATERAL    Vertigo    Vitamin D  deficiency     Past Surgical History:  Procedure Laterality Date   BREAST BIOPSY  05/27/03   left   CARDIAC CATHETERIZATION     CARPAL TUNNEL RELEASE  05/27/03   Right   CESAREAN SECTION  05/27/03   CORONARY ARTERY BYPASS GRAFT N/A 08/17/2018   Procedure: CORONARY ARTERY BYPASS GRAFTING (CABG) TIMES TWO: LIMA to LAD, SVG to RAMUS INTERMEDIATE)  USING LEFT INTERNAL MAMMARY ARTERY AND RIGHT GREATER SAPHENOUS VEIN HARVESTED ENDOSCOPICALLY.;  Surgeon: Norita Beauvais, MD;  Location: [facility] OR;  Service: Open Heart Surgery;  Laterality: N/A;   KNEE SURGERY     left- removal fatty tumor   ROTATOR CUFF REPAIR     Bilateral   TEE WITHOUT CARDIOVERSION N/A 08/17/2018   Procedure: TRANSESOPHAGEAL ECHOCARDIOGRAM (TEE);  Surgeon: Norita Beauvais, MD;  Location: [facility] OR;  Service: Open Heart Surgery;  Laterality: N/A;   TOTAL KNEE ARTHROPLASTY  10/10/2012   Procedure: TOTAL KNEE ARTHROPLASTY;  Surgeon: Aurther Blue, MD;  Location: [facility] ORS;  Service: Orthopedics;  Laterality: Right;    Social History   Socioeconomic History  Marital status: Married    Spouse name: Royston Cornea   Number of children: 2   Years of education: Not on file   Highest education level: 12th grade  Occupational History   Occupation: retired  Tobacco Use   Smoking status: Former    Current packs/day: 0.00    Types: Cigarettes    Quit date: 09/12/1990    Years since quitting: 33.3   Smokeless tobacco: Never  Vaping Use   Vaping status: Never Used  Substance and Sexual Activity   Alcohol use: Yes    Alcohol/week: 0.0 standard drinks of alcohol    Comment: rare   Drug use: No   Sexual activity: Not on file  Other Topics Concern   Not on file  Social History Narrative   Lives with husband in a split level home.  Has 3 children.     Recently Retired    Highest level of education:  12th grade   Social Drivers of Corporate investment banker Strain: Low Risk  (07/08/2023)   Overall Financial Resource Strain (CARDIA)    Difficulty of Paying Living Expenses: Not hard at all  Food Insecurity: No Food Insecurity (07/08/2023)   Hunger Vital Sign    Worried About Running Out of Food in the Last Year: Never true    Ran Out of Food in the Last Year: Never true  Transportation Needs: No Transportation Needs (07/08/2023)   PRAPARE - Administrator, Civil Service (Medical): No    Lack of Transportation (Non-Medical): No  Physical Activity: Insufficiently Active (07/08/2023)   Exercise Vital Sign    Days of Exercise per Week: 3 days    Minutes of Exercise per Session: 10 min  Stress: No Stress Concern Present (07/08/2023)   [employer] of Occupational Health - Occupational Stress Questionnaire    Feeling of Stress : Not at all  Social Connections: Socially Integrated (07/08/2023)   Social Connection and Isolation Panel [NHANES]    Frequency of Communication with Friends and Family: More than three times a week    Frequency of Social Gatherings with Friends and Family: Once a week    Attends Religious Services: More than 4 times per year    Active Member of [employer] or Organizations: Yes    Attends Engineer, structural: More than 4 times per year    Marital Status: Married  Catering manager Violence: Not on file   Family History  Problem Relation Age of Onset   COPD Mother        Deceased   High blood pressure Mother    High Cholesterol Mother    Depression Mother    Lung cancer Father        Deceaesd   High Cholesterol Father    Diabetes Other    Melanoma Brother        Deceased   COPD Brother    Healthy Daughter     Current Outpatient Medications  Medication Sig Dispense Refill   acetaminophen  (TYLENOL ) 500 MG tablet Take 500 mg by mouth every 6 (six) hours as needed for moderate pain.      allopurinol   (ZYLOPRIM ) 100 MG tablet Take 1 tablet (100 mg total) by mouth daily. Alternate taking one tab daily  with one half tab by mouth daily 135 tablet 3   aspirin  EC (ASPIRIN  LOW DOSE) 81 MG tablet TAKE 1 TABLET BY MOUTH EVERY DAY 100 tablet 3   azelastine  (ASTELIN ) 0.1 % nasal spray One spray  each nostril daily as directed (Patient not taking: Reported on 12/07/2023) 30 mL 12   candesartan  (ATACAND ) 16 MG tablet TAKE 1 TABLET BY MOUTH EVERY DAY 30 tablet 11   colchicine  0.6 MG tablet TAKE 1 TABLET BY MOUTH EVERY DAY 90 tablet 3   Dulaglutide (TRULICITY) 0.75 MG/0.5ML SOAJ INJECT 0.75 MG SUBCUTANEOUSLY ONE TIME PER WEEK 6 mL 1   ezetimibe  (ZETIA ) 10 MG tablet TAKE 1 TABLET BY MOUTH EVERY DAY 30 tablet 11   FLUoxetine  (PROZAC ) 20 MG tablet TAKE 1 TABLET BY MOUTH EVERY DAY 90 tablet 3   hydrOXYzine  (ATARAX ) 10 MG tablet Take 1 tablet (10 mg total) by mouth 3 (three) times daily as needed. (Patient not taking: Reported on 12/07/2023) 30 tablet 0   metFORMIN  (GLUCOPHAGE ) 1000 MG tablet TAKE 1 TABLET BY MOUTH TWICE A DAY WITH FOOD 180 tablet 3   metoprolol  succinate (TOPROL -XL) 25 MG 24 hr tablet TAKE 1 TABLET BY MOUTH IN THE MORNING AND AT BEDTIME 180 tablet 3   nitroGLYCERIN  (NITROSTAT ) 0.4 MG SL tablet Place 1 tablet (0.4 mg total) under the tongue every 5 (five) minutes as needed for chest pain. 25 tablet 3   rosuvastatin  (CRESTOR ) 40 MG tablet TAKE 1 TABLET BY MOUTH EVERY DAY 30 tablet 11   trospium  (SANCTURA ) 20 MG tablet TAKE 1 TABLET BY MOUTH TWICE A DAY 180 tablet 1   No current facility-administered medications for this visit.    Allergies  Allergen Reactions   Icosapent  Ethyl (Epa Ethyl Ester) (Fish) Diarrhea   Statins     Myalgia pain moderate, flu like symptoms.    Morphine  Sulfate     Severe hallucinations, difficult arousal.    Oxycodone  Nausea Only    Has to take nausea medications if taken. Prefer not to take     REVIEW OF SYSTEMS:  [X]  denotes positive finding, [ ]  denotes  negative finding Cardiac  Comments:  Chest pain or chest pressure:    Shortness of breath upon exertion:    Short of breath when lying flat:    Irregular heart rhythm:        Vascular    Pain in calf, thigh, or hip brought on by ambulation:    Pain in feet at night that wakes you up from your sleep:     Blood clot in your veins:    Leg swelling:         Pulmonary    Oxygen at home:    Productive cough:     Wheezing:         Neurologic    Sudden weakness in arms or legs:     Sudden numbness in arms or legs:     Sudden onset of difficulty speaking or slurred speech:    Temporary loss of vision in one eye:     Problems with dizziness:         Gastrointestinal    Blood in stool:     Vomited blood:         Genitourinary    Burning when urinating:     Blood in urine:        Psychiatric    Major depression:         Hematologic    Bleeding problems:    Problems with blood clotting too easily:        Skin    Rashes or ulcers:        Constitutional    Fever or chills:  PHYSICAL EXAMINATION:  There were no vitals filed for this visit.   General:  WDWN in NAD; vital signs documented above Gait: Not observed HENT: WNL, normocephalic Pulmonary: normal non-labored breathing , without wheezing, lungs were clear on auscultation Cardiac: regular HR Abdomen: soft, NT, no masses Skin: without rashes Vascular Exam/Pulses:  Right Left  Radial 2+ (normal) 2+ (normal)                       Extremities: without ischemic changes, without Gangrene , without cellulitis; without open wounds;  Musculoskeletal: no muscle wasting or atrophy  Neurologic: A&O X 3;  No focal weakness or paresthesias are detected Psychiatric:  The pt has Normal affect.   Non-Invasive Vascular Imaging:   Right Carotid Findings:  +----------+--------+--------+--------+-------------------------+--------+           PSV cm/sEDV cm/sStenosisPlaque Description       Comments   +----------+--------+--------+--------+-------------------------+--------+  CCA Prox  62      8                                                  +----------+--------+--------+--------+-------------------------+--------+  CCA Distal62      13                                                 +----------+--------+--------+--------+-------------------------+--------+  ICA Prox  125     29      1-39%   calcific and heterogenous          +----------+--------+--------+--------+-------------------------+--------+  ICA Mid   92      21                                                 +----------+--------+--------+--------+-------------------------+--------+  ICA Distal85      13                                                 +----------+--------+--------+--------+-------------------------+--------+  ECA      100     7                                                  +----------+--------+--------+--------+-------------------------+--------+   +----------+--------+-------+----------------+-------------------+           PSV cm/sEDV cmsDescribe        Arm Pressure (mmHG)  +----------+--------+-------+----------------+-------------------+  [id number]    0      Multiphasic, [id number]                  +----------+--------+-------+----------------+-------------------+   +---------+--------+--+--------+--+---------+  VertebralPSV cm/s35EDV cm/s10Antegrade  +---------+--------+--+--------+--+---------+      Left Carotid Findings:  +----------+--------+--------+--------+------------------+--------+           PSV cm/sEDV cm/sStenosisPlaque DescriptionComments  +----------+--------+--------+--------+------------------+--------+  CCA Prox  74      10                                          +----------+--------+--------+--------+------------------+--------+  CCA Distal54      11                                           +----------+--------+--------+--------+------------------+--------+  ICA Prox  339     120     80-99%  heterogenous                +----------+--------+--------+--------+------------------+--------+  ICA Mid   270     81                                          +----------+--------+--------+--------+------------------+--------+  ICA Distal119     30                                          +----------+--------+--------+--------+------------------+--------+  ECA      125     1                                           +----------+--------+--------+--------+------------------+--------+   +----------+--------+--------+----------------+-------------------+           PSV cm/sEDV cm/sDescribe        Arm Pressure (mmHG)  +----------+--------+--------+----------------+-------------------+  [id number]    0       Multiphasic, [id number]                  +----------+--------+--------+----------------+-------------------+   +---------+--------+--+--------+-+---------+  VertebralPSV cm/s31EDV cm/s7Antegrade  +---------+--------+--+--------+-+---------+     ASSESSMENT/PLAN: Niharika Novakowski is a 68 y.o. female presenting with critical left sided ICA stenosis.  Fortunately, she is asymptomatic.  Cindy and I had a long discussion regarding asymptomatic internal carotid artery stenosis, most notably the risks and benefits of surgery.  She is aware that she has an 11% risk of stroke over the next 5 years, which can be reduced to 5% with cerebrovascular revascularization.  She is aware that there are multiple ways to revascularize.  Unfortunately, she has a short runway, and is not a candidate for transcarotid artery revascularization.  She would be best served with carotid endarterectomy.  After discussing risks and benefits of left-sided carotid endarterectomy for asymptomatic critical ICA stenosis, Cindy elected to proceed.  She will be scheduled after  preoperative risk stratification with Dr. Lavonne Prairie. I would like for her to get over her cough prior to surgery as well. I plan to forward my note to her primary, Dr. Andree Kayser.  Kayla Part, MD [facility] [phone] Total time of patient care including pre-visit research, consultation, and documentation greater than 30 minutes

## 2024-01-09 ENCOUNTER — Encounter: Payer: Self-pay | Admitting: Emergency Medicine

## 2024-01-09 ENCOUNTER — Ambulatory Visit: Admission: EM | Admit: 2024-01-09 | Discharge: 2024-01-09 | Disposition: A | Discharge status: Home / Self Care

## 2024-01-09 DIAGNOSIS — Z20822 Contact with and (suspected) exposure to covid-19: Secondary | ICD-10-CM | POA: Diagnosis not present

## 2024-01-09 DIAGNOSIS — J22 Unspecified acute lower respiratory infection: Secondary | ICD-10-CM | POA: Diagnosis not present

## 2024-01-09 DIAGNOSIS — K5730 Diverticulosis of large intestine without perforation or abscess without bleeding: Secondary | ICD-10-CM | POA: Insufficient documentation

## 2024-01-09 MED ORDER — AMOXICILLIN-POT CLAVULANATE 875-125 MG PO TABS: 1.0000 | ORAL_TABLET | Freq: Two times a day (BID) | ORAL | 0 refills | Status: DC

## 2024-01-09 MED ORDER — BENZONATATE 200 MG PO CAPS: 200.0000 mg | ORAL_CAPSULE | Freq: Three times a day (TID) | ORAL | 0 refills | Status: DC | PRN

## 2024-01-09 NOTE — Discharge Instructions (Addendum)
 COVID test negative. Start Augmentin take twice daily for 7 days for lower respiratory infection.  This is also protective against acquiring a pneumonia.  Continue Tessalon  Perles 200 mg 3 times daily as needed for cough.  Hydrate well with fluids.  If at any point your symptoms worsen return for reevaluation.

## 2024-01-09 NOTE — ED Provider Notes (Signed)
 [facility]    CSN: [id number] Arrival date & time: 01/09/24  1130      History   Chief Complaint Chief Complaint  Patient presents with   Cough    HPI Pamela Thomas is a 68 y.o. female.    Cough With of allergic rhinitis presents today with a 3-week history of coughing.  Cough is nonproductive.  Is not experiencing shortness of breath or fever.  Endorses symptoms initially started allergy symptoms such as nasal congestion and drainage and subsequently progressed to persistent coughing and chest congestion.  Patient recently traveled to [place] for a week and is concerned she may have picked up something there as her symptoms worsened over the last week.  To her knowledge she has not had any fever.  Past Medical History:  Diagnosis Date   ALLERGIC RHINITIS, SEASONAL    Anxiety    Back pain    BCC (basal cell carcinoma of skin) 2005   Nose [facility])   Carotid artery occlusion    Constipation    Coronary artery disease    Depression    Diabetes mellitus without complication (HCC)    DYSLIPIDEMIA    Dysrhythmia    palpitations-evaluated by Dr Stann Earnest 6/12/eccho 6/13   Glucose intolerance (impaired glucose tolerance)    History of blood transfusion    Hypertension    Joint pain    OBESITY, NOS    Osteoarthritis    rt knee   PLANTAR FASCIITIS, BILATERAL    Vertigo    Vitamin D  deficiency     Patient Active Problem List   Diagnosis Date Noted   Diverticular disease of colon 01/09/2024   Actinic keratoses 07/27/2023   Acquired trigger finger of left middle finger 07/24/2023   Left wrist pain 07/24/2023   Right wrist pain 07/24/2023   Neck muscle spasm 04/17/2023   Carpal tunnel syndrome of left wrist 09/14/2022   De Quervain's disease (tenosynovitis) 06/09/2022   Neoplasm of uncertain behavior 05/05/2022   Mixed stress and urge urinary incontinence 05/05/2022   Elevated blood uric acid level 04/28/2021   CKD (chronic kidney  disease), stage II 02/03/2021   Nonrheumatic aortic valve stenosis 07/25/2019   Dysthymia 01/30/2019   S/P CABG x 1 10/15/2018   Carotid atherosclerosis, bilateral 10/15/2018   Coronary artery disease 08/17/2018   Insomnia 12/26/2013   Right knee DJD, s/p TKR 06/21/2012   Family history of malignant melanoma 06/15/2011   Type 2 diabetes mellitus with hyperglycemia, without long-term current use of insulin  (HCC) 12/09/2009   Type 2 diabetes mellitus without complications (HCC) 12/09/2009   CARDIAC MURMUR 05/27/2009   Hyperlipidemia associated with type 2 diabetes mellitus (HCC) 05/21/2007   ALLERGIC RHINITIS, SEASONAL 01/08/2007   HYPERTENSION, BENIGN SYSTEMIC 11/09/2006    Past Surgical History:  Procedure Laterality Date   BREAST BIOPSY  05/27/03   left   CARDIAC CATHETERIZATION     CARPAL TUNNEL RELEASE  05/27/03   Right   CESAREAN SECTION  05/27/03   CORONARY ARTERY BYPASS GRAFT N/A 08/17/2018   Procedure: CORONARY ARTERY BYPASS GRAFTING (CABG) TIMES TWO: LIMA to LAD, SVG to RAMUS INTERMEDIATE)  USING LEFT INTERNAL MAMMARY ARTERY AND RIGHT GREATER SAPHENOUS VEIN HARVESTED ENDOSCOPICALLY.;  Surgeon: Norita Beauvais, MD;  Location: [facility] OR;  Service: Open Heart Surgery;  Laterality: N/A;   KNEE SURGERY     left- removal fatty tumor   ROTATOR CUFF REPAIR     Bilateral   TEE WITHOUT CARDIOVERSION N/A 08/17/2018  Procedure: TRANSESOPHAGEAL ECHOCARDIOGRAM (TEE);  Surgeon: Norita Beauvais, MD;  Location: [facility] OR;  Service: Open Heart Surgery;  Laterality: N/A;   TOTAL KNEE ARTHROPLASTY  10/10/2012   Procedure: TOTAL KNEE ARTHROPLASTY;  Surgeon: Aurther Blue, MD;  Location: [facility] ORS;  Service: Orthopedics;  Laterality: Right;    OB History     Gravida  2   Para  2   Term      Preterm      AB      Living         SAB      IAB      Ectopic      Multiple      Live Births               Home Medications    Prior to Admission medications   Medication Sig  Start Date End Date Taking? Authorizing Provider  amoxicillin-clavulanate (AUGMENTIN) 875-125 MG tablet Take 1 tablet by mouth every 12 (twelve) hours. 01/09/24  Yes Buena Carmine, NP  benzonatate  (TESSALON ) 200 MG capsule Take 1 capsule (200 mg total) by mouth 3 (three) times daily as needed for cough. 01/09/24  Yes Buena Carmine, NP  Fluoxetine  HCl, PMDD, 20 MG TABS Take by mouth. 03/21/14  Yes [provider]  fluticasone  (FLONASE ) 50 MCG/ACT nasal spray 1 spray Once Daily. 12/26/13  Yes [provider]  losartan  (COZAAR ) 100 MG tablet Take by mouth. 12/26/13  Yes [provider]  meloxicam  (MOBIC ) 15 MG tablet Take by mouth. 12/26/13  Yes [provider]  acetaminophen  (TYLENOL ) 500 MG tablet Take 500 mg by mouth every 6 (six) hours as needed for moderate pain.     [provider]  allopurinol  (ZYLOPRIM ) 100 MG tablet Take 1 tablet (100 mg total) by mouth daily. Alternate taking one tab daily  with one half tab by mouth daily 01/01/24   Charise Companion, MD  aspirin  EC (ASPIRIN  LOW DOSE) 81 MG tablet TAKE 1 TABLET BY MOUTH EVERY DAY 06/28/22   Charise Companion, MD  azelastine  (ASTELIN ) 0.1 % nasal spray One spray each nostril daily as directed Patient not taking: Reported on 12/07/2023 05/31/23   Charise Companion, MD  candesartan  (ATACAND ) 16 MG tablet TAKE 1 TABLET BY MOUTH EVERY DAY 01/24/23   Eilleen Grates, MD  chlorthalidone  (HYGROTON ) 25 MG tablet Take 1 tablet by mouth daily.    [provider]  colchicine  0.6 MG tablet TAKE 1 TABLET BY MOUTH EVERY DAY 09/20/23   Charise Companion, MD  doxycycline (MONODOX) 100 MG capsule Take 1 capsule by mouth 2 (two) times daily.    [provider]  Dulaglutide (TRULICITY) 0.75 MG/0.5ML SOAJ INJECT 0.75 MG SUBCUTANEOUSLY ONE TIME PER WEEK 12/04/23   Charise Companion, MD  erythromycin ophthalmic ointment APPLY 1 APPLICATION TWICE A DAY APPLY OINTMENT TO RIGHT LOWER LID TWICE A DAY FOR 7 DAYS THEN STOP    [provider]  ezetimibe  (ZETIA ) 10 MG tablet TAKE 1 TABLET BY MOUTH EVERY DAY 02/23/23   Eilleen Grates, MD  FLUoxetine  (PROZAC ) 20 MG tablet TAKE 1 TABLET BY MOUTH EVERY DAY 10/20/23   Charise Companion, MD  hydrOXYzine  (ATARAX ) 10 MG tablet Take 1 tablet (10 mg total) by mouth 3 (three) times daily as needed. Patient not taking: Reported on 12/07/2023 12/28/22   Charise Companion, MD  metFORMIN  (GLUCOPHAGE ) 1000 MG tablet TAKE 1 TABLET BY MOUTH TWICE A DAY WITH  FOOD 09/19/23   Charise Companion, MD  metoprolol  succinate (TOPROL -XL) 25 MG 24 hr tablet TAKE 1 TABLET BY MOUTH IN THE MORNING AND AT BEDTIME 09/19/23   Charise Companion, MD  nitroGLYCERIN  (NITROSTAT ) 0.4 MG SL tablet Place 1 tablet (0.4 mg total) under the tongue every 5 (five) minutes as needed for chest pain. 03/16/22 06/14/22  Eilleen Grates, MD  rosuvastatin  (CRESTOR ) 40 MG tablet TAKE 1 TABLET BY MOUTH EVERY DAY 01/24/23   Eilleen Grates, MD  trospium  (SANCTURA ) 20 MG tablet TAKE 1 TABLET BY MOUTH TWICE A DAY 09/15/23   Charise Companion, MD    Family History Family History  Problem Relation Age of Onset   COPD Mother        Deceased   High blood pressure Mother    High Cholesterol Mother    Depression Mother    Lung cancer Father        Deceaesd   High Cholesterol Father    Diabetes Other    Melanoma Brother        Deceased   COPD Brother    Healthy Daughter     Social History Social History   Tobacco Use   Smoking status: Former    Current packs/day: 0.00    Types: Cigarettes    Quit date: 09/12/1990    Years since quitting: 33.3   Smokeless tobacco: Never  Vaping Use   Vaping status: Never Used  Substance Use Topics   Alcohol use: Yes    Alcohol/week: 0.0 standard drinks of alcohol    Comment: rare   Drug use: No     Allergies   Icosapent  ethyl (epa ethyl ester) (fish), Statins, Morphine , Morphine  sulfate, and Oxycodone    Review of Systems Review of Systems  Respiratory:  Positive for cough.    Physical Exam Triage Vital  Signs ED Triage Vitals  Encounter Vitals Group     BP 01/09/24 1226 132/78     Systolic BP Percentile --      Diastolic BP Percentile --      Pulse Rate 01/09/24 1226 70     Resp 01/09/24 1226 18     Temp 01/09/24 1226 98.6 F (37 C)     Temp Source 01/09/24 1226 Oral     SpO2 01/09/24 1226 99 %     Weight 01/09/24 1226 223 lb 5.2 oz (101.3 kg)     Height --      Head Circumference --      Peak Flow --      Pain Score 01/09/24 1225 0     Pain Loc --      Pain Education --      Exclude from Growth Chart --    No data found.  Updated Vital Signs BP 132/78 (BP Location: Left Arm)   Pulse 70   Temp 98.6 F (37 C) (Oral)   Resp 18   Wt 223 lb 5.2 oz (101.3 kg)   LMP 10/10/2010   SpO2 99%   BMI 40.85 kg/m   Visual Acuity Right Eye Distance:   Left Eye Distance:   Bilateral Distance:    Right Eye Near:   Left Eye Near:    Bilateral Near:     Physical Exam Constitutional:      Appearance: Normal appearance. She is ill-appearing.  HENT:     Head: Normocephalic and atraumatic.     Nose: Nose normal.     Mouth/Throat:     Mouth: Mucous  membranes are dry.  Eyes:     Extraocular Movements: Extraocular movements intact.     Pupils: Pupils are equal, round, and reactive to light.  Cardiovascular:     Rate and Rhythm: Normal rate.  Pulmonary:     Effort: Pulmonary effort is normal.     Breath sounds: Rhonchi present. No wheezing.  Musculoskeletal:        General: Normal range of motion.     Cervical back: Normal range of motion and neck supple.  Lymphadenopathy:     Cervical: No cervical adenopathy.  Skin:    General: Skin is warm and dry.  Neurological:     General: No focal deficit present.     Mental Status: She is alert and oriented to person, place, and time.      UC Treatments / Results  Labs (all labs ordered are listed, but only abnormal results are displayed) Labs Reviewed  POC SARS CORONAVIRUS 2 AG -  ED    EKG   Radiology No results  found.  Procedures Procedures (including critical care time)  Medications Ordered in UC Medications - No data to display  Initial Impression / Assessment and Plan / UC Course  I have reviewed the triage vital signs and the nursing notes.  Pertinent labs & imaging results that were available during my care of the patient were reviewed by me and considered in my medical decision making (see chart for details).    Lower respiratory infection treatment with Augmentin twice daily for 7 days.  Tessalon  Perles 200 mg 3 times daily as needed for cough.  COVID test given patient's recent out of the country travel and worsening of symptoms, COVID test negative.  Hydrate well with fluids.  Return precautions given. Final Clinical Impressions(s) / UC Diagnoses   Final diagnoses:  Acute lower respiratory infection  Encounter for laboratory testing for COVID-19 virus     Discharge Instructions        Start Augmentin take twice daily for 7 days for lower respiratory infection.  This is also protective against acquiring a pneumonia.  Continue Tessalon  Perles 200 mg 3 times daily as needed for cough.  Hydrate well with fluids.  If at any point your symptoms worsen return for reevaluation.     ED Prescriptions     Medication Sig Dispense Auth. Provider   amoxicillin-clavulanate (AUGMENTIN) 875-125 MG tablet Take 1 tablet by mouth every 12 (twelve) hours. 14 tablet Buena Carmine, NP   benzonatate  (TESSALON ) 200 MG capsule Take 1 capsule (200 mg total) by mouth 3 (three) times daily as needed for cough. 40 capsule Buena Carmine, NP      PDMP not reviewed this encounter.   Buena Carmine, NP 01/09/24 1312

## 2024-01-09 NOTE — ED Triage Notes (Signed)
 Pt c/o cough x 3 weeks. Pt denies emesis, fever and diarrhea.

## 2024-01-11 ENCOUNTER — Encounter: Payer: Self-pay | Admitting: Vascular Surgery

## 2024-01-11 ENCOUNTER — Ambulatory Visit: Attending: Vascular Surgery | Admitting: Vascular Surgery

## 2024-01-11 ENCOUNTER — Encounter: Payer: Self-pay | Admitting: Family Medicine

## 2024-01-11 VITALS — BP 102/57 | HR 68 | Temp 98.3°F | Resp 20 | Ht 62.0 in | Wt 224.7 lb

## 2024-01-11 DIAGNOSIS — I6522 Occlusion and stenosis of left carotid artery: Secondary | ICD-10-CM

## 2024-01-11 MED ORDER — CLOPIDOGREL BISULFATE 75 MG PO TABS: 75.0000 mg | ORAL_TABLET | Freq: Every day | ORAL | 6 refills | Status: DC

## 2024-01-15 ENCOUNTER — Encounter: Payer: Self-pay | Admitting: Physician Assistant

## 2024-01-15 ENCOUNTER — Ambulatory Visit: Attending: Physician Assistant | Admitting: Physician Assistant

## 2024-01-15 VITALS — BP 130/86 | HR 68 | Ht 62.0 in | Wt 223.0 lb

## 2024-01-15 DIAGNOSIS — E1169 Type 2 diabetes mellitus with other specified complication: Secondary | ICD-10-CM

## 2024-01-15 DIAGNOSIS — I10 Essential (primary) hypertension: Secondary | ICD-10-CM

## 2024-01-15 DIAGNOSIS — I350 Nonrheumatic aortic (valve) stenosis: Secondary | ICD-10-CM | POA: Diagnosis not present

## 2024-01-15 DIAGNOSIS — Z01810 Encounter for preprocedural cardiovascular examination: Secondary | ICD-10-CM

## 2024-01-15 DIAGNOSIS — I2729 Other secondary pulmonary hypertension: Secondary | ICD-10-CM

## 2024-01-15 DIAGNOSIS — E785 Hyperlipidemia, unspecified: Secondary | ICD-10-CM

## 2024-01-15 DIAGNOSIS — I2510 Atherosclerotic heart disease of native coronary artery without angina pectoris: Secondary | ICD-10-CM

## 2024-01-15 DIAGNOSIS — I6523 Occlusion and stenosis of bilateral carotid arteries: Secondary | ICD-10-CM | POA: Diagnosis not present

## 2024-01-15 NOTE — Patient Instructions (Signed)
 Medication Instructions:  Your physician recommends that you continue on your current medications as directed. Please refer to the Current Medication list given to you today.  *If you need a refill on your cardiac medications before your next appointment, please call your pharmacy*  Lab Work: None ordered  If you have labs (blood work) drawn today and your tests are completely normal, you will receive your results only by: MyChart Message (if you have MyChart) OR A paper copy in the mail If you have any lab test that is abnormal or we need to change your treatment, we will call you to review the results.  Testing/Procedures: None ordered  Follow-Up: At [facility], you and your health needs are our priority.  As part of our continuing mission to provide you with exceptional heart care, our providers are all part of one team.  This team includes your primary Cardiologist (physician) and Advanced Practice Providers or APPs (Physician Assistants and Nurse Practitioners) who all work together to provide you with the care you need, when you need it.  Your next appointment:   6 month(s)  Provider:   Eilleen Grates, MD       We recommend signing up for the patient portal called "MyChart".  Sign up information is provided on this After Visit Summary.  MyChart is used to connect with patients for Virtual Visits (Telemedicine).  Patients are able to view lab/test results, encounter notes, upcoming appointments, etc.  Non-urgent messages can be sent to your provider as well.   To learn more about what you can do with MyChart, go to [email or web address].   Other Instructions

## 2024-01-15 NOTE — Assessment & Plan Note (Signed)
 Critical left internal carotid artery stenosis noted on recent carotid Dopplers.  She is asymptomatic.  She has seen vascular surgery with plans to proceed with carotid endarterectomy as noted.

## 2024-01-15 NOTE — Assessment & Plan Note (Signed)
 LDL in April 2024 was optimal at 22.  Triglycerides were somewhat elevated.  Continue Zetia  10 mg daily, Crestor  40 mg daily.

## 2024-01-15 NOTE — Assessment & Plan Note (Signed)
 Blood pressure is controlled.  Continue candesartan  16 mg daily, chlorthalidone  25 mg daily, metoprolol  succinate 25 mg twice daily.

## 2024-01-15 NOTE — Progress Notes (Signed)
 Cardiology Office Note:    Date:  01/15/2024  ID:  Pamela Thomas, DOB 24-Sep-1955, MRN [id number] PCP: Pamela Companion, MD  [facility] Providers Cardiologist:  Eilleen Grates, MD       Patient Profile:      Coronary artery disease s/p CABG in 08/2018 Inferior STEMI 07/2018 [facility]) s/p overlapping 4 x 38 mm and 4.5 x 24 mm DES to the RCA [facility] 07/16/2018 [facility]): Proximal LAD 80 prior to takeoff of prominent tortuous diagonal branches; mid RCA 100 PCI of dLM/pLAD felt to be high risk>>s/p CABG (L-LAD, S-RI) TTE 07/20/2023: EF 60-65, no RWMA, normal RVSF, mildly elevated PASP, RVSP 45, mild MR, trivial AI, mild TR, RAP 3 Carotid artery disease Carotid US  11/24/23: R 1-39; L 80-99 Mild aortic stenosis TTE 02/2022: mild AS, Vmax 210 cm/s, mean 10 mmHg, AVA 1.6 cm2, DI 0.5 TTE 07/2023: mean 10.2 mmHg, Vmax 208 cm/s, DI 0.43 Mild pulmonary hypertension  TTE 07/2023: RVSP 45, mild TR Hypertension Hyperlipidemia Diabetes mellitus Chronic kidney disease  Obesity         Discussed the use of AI scribe software for clinical note transcription with the patient, who gave verbal consent to proceed.  History of Present Illness Pamela Thomas is a 68 y.o. female who returns for surgical clearance. She was last seen by Dr. Lavonne Thomas in 2023. She was recently referred to Dr. Christia Thomas with Vascular Surgery for asymptomatic critical LICA stenosis. She needs to undergo L CEA.   She is here alone. She experiences shortness of breath, particularly in hot and humid weather, requiring her to 'walk, stop, take a breath, walk, stop, take a breath.' This has been consistent since her heart attack, but she notes it is 'probably better than what it was.' No recent changes or worsening of symptoms. She was diagnosed with sleep apnea about a month and a half ago and uses a CPAP machine. Initially apprehensive due to claustrophobia, she has adapted well to the mask and reports improved sleep  quality, feeling rested after 7-8 hours of sleep. No swelling in her legs but recent swelling in her fingers. She has experienced gradual weight gain, which she attributes to changes in her medication from Ozempic  to Trulicity, and issues with bladder control medication, which she feels may cause fluid retention. No bleeding issues, black stools, or syncope. She can perform daily activities such as vacuuming and walking a block or two on level ground without stopping, although she dislikes stairs due to knee replacements.   Review of Systems  Gastrointestinal:  Negative for hematochezia and melena.  -See HPI    Studies Reviewed:   EKG Interpretation Date/Time:  Monday Jan 15 2024 14:06:20 EDT Ventricular Rate:  65 PR Interval:  164 QRS Duration:  86 QT Interval:  422 QTC Calculation: 438 R Axis:   66  Text Interpretation: Normal sinus rhythm Possible Anterior infarct No significant change since last tracing Confirmed by Pamela Thomas Single [phone]) on 01/15/2024 2:11:57 PM   Results Lab Results  Component Value Date/Time   LDLCALC 22 12/28/2022 10:38 AM   HDL 32 (L) 12/28/2022 10:38 AM   TRIG 263 (H) 12/28/2022 10:38 AM   CHOL 93 (L) 12/28/2022 10:38 AM   Recent Labs    06/21/23 1619  K 3.8  CREATININE 1.16*  EGFR 52*  ALT 58*        Risk Assessment/Calculations:             Physical Exam:   VS:  BP 130/86 (BP Location: Right Arm, Patient Position: Sitting, Cuff Size: Large)   Pulse 68   Ht 5\' 2"  (1.575 m)   Wt 223 lb (101.2 kg)   LMP 10/10/2010   SpO2 97%   BMI 40.79 kg/m    Wt Readings from Last 3 Encounters:  01/15/24 223 lb (101.2 kg)  01/11/24 224 lb 11.2 oz (101.9 kg)  01/09/24 223 lb 5.2 oz (101.3 kg)    Constitutional:      Appearance: Healthy appearance. Not in distress.  Neck:     Vascular: JVD normal.  Pulmonary:     Breath sounds: Normal breath sounds. No wheezing. No rales.  Cardiovascular:     Normal rate. Regular rhythm.     Murmurs: There is a  grade 2/6 systolic murmur at the URSB.  Edema:    Peripheral edema absent.  Abdominal:     Palpations: Abdomen is soft.       Assessment and Plan:   Assessment & Plan Preoperative cardiovascular examination Ms. Wilkerson's perioperative risk of a major cardiac event is 0.9% according to the Revised Cardiac Risk Index (RCRI).  Therefore, she is at low risk for perioperative complications.   Her functional capacity is fair at 4.74 METs according to the Duke Activity Status Index (DASI). Recommendations: According to ACC/AHA guidelines, no further cardiovascular testing needed.  The patient may proceed to surgery at acceptable risk.   Antiplatelet and/or Anticoagulation Recommendations: Ideally, the patient should remain on ASA without interruption. However, if the bleeding risk is too great, Aspirin  can be held for 7 days prior to her surgery.  Please resume Aspirin  post operatively when it is felt to be safe from a bleeding standpoint.  Clopidogrel  is managed by vascular surgery.  Coronary artery disease involving native coronary artery of native heart without angina pectoris History of inferior STEMI in November 2019 treated with overlapping DES x 2 to the RCA.  She had residual high-grade disease in the LAD which could not be treated with PCI.  She ultimately underwent CABG in December 2019 with LIMA-LAD and vein graft to the RI.  She is currently doing well without chest symptoms to suggest angina.  Continue aspirin  81 mg daily, candesartan  16 mg daily, Crestor  40 mg daily, nitroglycerin  as needed, metoprolol  succinate 25 mg twice daily.  Follow-up in 6 months Essential hypertension Blood pressure is controlled.  Continue candesartan  16 mg daily, chlorthalidone  25 mg daily, metoprolol  succinate 25 mg twice daily. Carotid atherosclerosis, bilateral Critical left internal carotid artery stenosis noted on recent carotid Dopplers.  She is asymptomatic.  She has seen vascular surgery with plans to  proceed with carotid endarterectomy as noted. Nonrheumatic aortic valve stenosis Mean gradient 10.2 mmHg by echocardiogram November 2024.  Consider repeat echocardiogram in 2-3 years. Hyperlipidemia associated with type 2 diabetes mellitus (HCC) LDL in April 2024 was optimal at 22.  Triglycerides were somewhat elevated.  Continue Zetia  10 mg daily, Crestor  40 mg daily. Other secondary pulmonary hypertension (HCC) Likely related to untreated OSA. She is now on CPAP.          Dispo:  Return in about 6 months (around 07/17/2024) for Routine Follow Up w/ Dr. Lavonne Thomas.  Signed, Pamela Thomas Single, PA-C

## 2024-01-15 NOTE — Assessment & Plan Note (Signed)
 Mean gradient 10.2 mmHg by echocardiogram November 2024.  Consider repeat echocardiogram in 2-3 years.

## 2024-01-15 NOTE — Assessment & Plan Note (Signed)
 History of inferior STEMI in November 2019 treated with overlapping DES x 2 to the RCA.  She had residual high-grade disease in the LAD which could not be treated with PCI.  She ultimately underwent CABG in December 2019 with LIMA-LAD and vein graft to the RI.  She is currently doing well without chest symptoms to suggest angina.  Continue aspirin  81 mg daily, candesartan  16 mg daily, Crestor  40 mg daily, nitroglycerin  as needed, metoprolol  succinate 25 mg twice daily.  Follow-up in 6 months

## 2024-01-15 NOTE — Assessment & Plan Note (Signed)
>>  ASSESSMENT AND PLAN FOR H/O CAROTID ENDARTERECTOMY WRITTEN ON 01/15/2024  2:37 PM BY WEAVER, SCOTT T, PA-C  Critical left internal carotid artery stenosis noted on recent carotid Dopplers.  She is asymptomatic.  She has seen vascular surgery with plans to proceed with carotid endarterectomy as noted.

## 2024-01-16 ENCOUNTER — Other Ambulatory Visit: Payer: Self-pay

## 2024-01-16 ENCOUNTER — Other Ambulatory Visit: Payer: Self-pay | Admitting: Cardiology

## 2024-01-16 ENCOUNTER — Other Ambulatory Visit: Payer: Self-pay | Admitting: Family Medicine

## 2024-01-16 ENCOUNTER — Telehealth: Payer: Self-pay

## 2024-01-16 DIAGNOSIS — I6522 Occlusion and stenosis of left carotid artery: Secondary | ICD-10-CM

## 2024-01-16 MED ORDER — PREDNISONE 50 MG PO TABS: ORAL_TABLET | ORAL | 1 refills | Status: DC

## 2024-01-16 NOTE — Telephone Encounter (Signed)
 Attempted to call for surgery scheduling. LVM

## 2024-01-23 NOTE — Pre-Procedure Instructions (Addendum)
 Surgical Instructions   Your procedure is scheduled on Feb 02, 2024. Report to [facility] Main Entrance "A" at 5:30 A.M., then check in with the Admitting office. Any questions or running late day of surgery: call [phone]  Questions prior to your surgery date: call [phone], Monday-Friday, 8am-4pm. If you experience any cold or flu symptoms such as cough, fever, chills, shortness of breath, etc. between now and your scheduled surgery, please notify us  at the above number.     Remember:  Do not eat or drink after midnight the night before your surgery   Take these medicines the morning of surgery with A SIP OF WATER: allopurinol  (ZYLOPRIM )  aspirin  EC  clopidogrel  (PLAVIX )  colchicine   FLUoxetine  (PROZAC )  metoprolol  succinate (TOPROL -XL)  rosuvastatin  (CRESTOR )  trospium  (SANCTURA )    May take these medicines IF NEEDED: acetaminophen  (TYLENOL )  nitroGLYCERIN  (NITROSTAT ) - if dose taken prior to surgery, please call either of the above phone numbers   One week prior to surgery, STOP taking any Aleve, Naproxen, Ibuprofen , Motrin , Advil , Goody's, BC's, all herbal medications, fish oil, and non-prescription vitamins.   WHAT DO I DO ABOUT MY DIABETES MEDICATION?   Do not take metFORMIN  (GLUCOPHAGE ) the morning of surgery.   HOW TO MANAGE YOUR DIABETES BEFORE AND AFTER SURGERY  Why is it important to control my blood sugar before and after surgery? Improving blood sugar levels before and after surgery helps healing and can limit problems. A way of improving blood sugar control is eating a healthy diet by:  Eating less sugar and carbohydrates  Increasing activity/exercise  Talking with your doctor about reaching your blood sugar goals High blood sugars (greater than 180 mg/dL) can raise your risk of infections and slow your recovery, so you will need to focus on controlling your diabetes during the weeks before surgery. Make sure that the doctor who takes care of  your diabetes knows about your planned surgery including the date and location.  How do I manage my blood sugar before surgery? Check your blood sugar at least 4 times a day, starting 2 days before surgery, to make sure that the level is not too high or low.  Check your blood sugar the morning of your surgery when you wake up and every 2 hours until you get to the Short Stay unit.  If your blood sugar is less than 70 mg/dL, you will need to treat for low blood sugar: Do not take insulin . Treat a low blood sugar (less than 70 mg/dL) with  cup of clear juice (cranberry or apple), 4 glucose tablets, OR glucose gel. Recheck blood sugar in 15 minutes after treatment (to make sure it is greater than 70 mg/dL). If your blood sugar is not greater than 70 mg/dL on recheck, call [phone] for further instructions. Report your blood sugar to the short stay nurse when you get to Short Stay.  If you are admitted to the hospital after surgery: Your blood sugar will be checked by the staff and you will probably be given insulin  after surgery (instead of oral diabetes medicines) to make sure you have good blood sugar levels. The goal for blood sugar control after surgery is 80-180 mg/dL.                      Do NOT Smoke (Tobacco/Vaping) for 24 hours prior to your procedure.  If you use a CPAP at night, you may bring your mask/headgear for your overnight stay.   You  will be asked to remove any contacts, glasses, piercing's, hearing aid's, dentures/partials prior to surgery. Please bring cases for these items if needed.    Patients discharged the day of surgery will not be allowed to drive home, and someone needs to stay with them for 24 hours.  SURGICAL WAITING ROOM VISITATION Patients may have no more than 2 support people in the waiting area - these visitors may rotate.   Pre-op nurse will coordinate an appropriate time for 1 ADULT support person, who may not rotate, to accompany patient in  pre-op.  Children under the age of 77 must have an adult with them who is not the patient and must remain in the main waiting area with an adult.  If the patient needs to stay at the hospital during part of their recovery, the visitor guidelines for inpatient rooms apply.  Please refer to the [facility] website for the visitor guidelines for any additional information.   If you received a COVID test during your pre-op visit  it is requested that you wear a mask when out in public, stay away from anyone that may not be feeling well and notify your surgeon if you develop symptoms. If you have been in contact with anyone that has tested positive in the last 10 days please notify you surgeon.      Pre-operative CHG Bathing Instructions   You can play a key role in reducing the risk of infection after surgery. Your skin needs to be as free of germs as possible. You can reduce the number of germs on your skin by washing with CHG (chlorhexidine  gluconate) soap before surgery. CHG is an antiseptic soap that kills germs and continues to kill germs even after washing.   DO NOT use if you have an allergy to chlorhexidine /CHG or antibacterial soaps. If your skin becomes reddened or irritated, stop using the CHG and notify one of our RNs at [phone].              TAKE A SHOWER THE NIGHT BEFORE SURGERY AND THE DAY OF SURGERY    Please keep in mind the following:  DO NOT shave, including legs and underarms, 48 hours prior to surgery.   You may shave your face before/day of surgery.  Place clean sheets on your bed the night before surgery Use a clean washcloth (not used since being washed) for each shower. DO NOT sleep with pet's night before surgery.  CHG Shower Instructions:  Wash your face and private area with normal soap. If you choose to wash your hair, wash first with your normal shampoo.  After you use shampoo/soap, rinse your hair and body thoroughly to remove shampoo/soap residue.  Turn  the water OFF and apply half the bottle of CHG soap to a CLEAN washcloth.  Apply CHG soap ONLY FROM YOUR NECK DOWN TO YOUR TOES (washing for 3-5 minutes)  DO NOT use CHG soap on face, private areas, open wounds, or sores.  Pay special attention to the area where your surgery is being performed.  If you are having back surgery, having someone wash your back for you may be helpful. Wait 2 minutes after CHG soap is applied, then you may rinse off the CHG soap.  Pat dry with a clean towel  Put on clean pajamas    Additional instructions for the day of surgery: DO NOT APPLY any lotions, deodorants, cologne, or perfumes.   Do not wear jewelry or makeup Do not wear nail polish,  gel polish, artificial nails, or any other type of covering on natural nails (fingers and toes) Do not bring valuables to the hospital. [facility] is not responsible for valuables/personal belongings. Put on clean/comfortable clothes.  Please brush your teeth.  Ask your nurse before applying any prescription medications to the skin.

## 2024-01-24 ENCOUNTER — Telehealth: Admitting: Family Medicine

## 2024-01-24 ENCOUNTER — Encounter (HOSPITAL_COMMUNITY): Payer: Self-pay

## 2024-01-24 ENCOUNTER — Other Ambulatory Visit: Payer: Self-pay

## 2024-01-24 ENCOUNTER — Other Ambulatory Visit: Payer: Self-pay | Admitting: Family Medicine

## 2024-01-24 ENCOUNTER — Encounter (HOSPITAL_COMMUNITY)
Admission: RE | Admit: 2024-01-24 | Discharge: 2024-01-24 | Disposition: A | Discharge status: Home / Self Care | Source: Ambulatory Visit | Attending: Vascular Surgery | Admitting: Vascular Surgery

## 2024-01-24 VITALS — BP 149/80 | HR 78 | Temp 98.1°F | Resp 18 | Ht 62.0 in | Wt 225.0 lb

## 2024-01-24 DIAGNOSIS — Z01812 Encounter for preprocedural laboratory examination: Secondary | ICD-10-CM | POA: Insufficient documentation

## 2024-01-24 DIAGNOSIS — I2510 Atherosclerotic heart disease of native coronary artery without angina pectoris: Secondary | ICD-10-CM | POA: Insufficient documentation

## 2024-01-24 DIAGNOSIS — E785 Hyperlipidemia, unspecified: Secondary | ICD-10-CM | POA: Diagnosis not present

## 2024-01-24 DIAGNOSIS — I6522 Occlusion and stenosis of left carotid artery: Secondary | ICD-10-CM | POA: Insufficient documentation

## 2024-01-24 DIAGNOSIS — R052 Subacute cough: Secondary | ICD-10-CM

## 2024-01-24 DIAGNOSIS — G4733 Obstructive sleep apnea (adult) (pediatric): Secondary | ICD-10-CM | POA: Insufficient documentation

## 2024-01-24 DIAGNOSIS — I252 Old myocardial infarction: Secondary | ICD-10-CM | POA: Diagnosis not present

## 2024-01-24 DIAGNOSIS — E119 Type 2 diabetes mellitus without complications: Secondary | ICD-10-CM | POA: Diagnosis not present

## 2024-01-24 DIAGNOSIS — Z951 Presence of aortocoronary bypass graft: Secondary | ICD-10-CM | POA: Diagnosis not present

## 2024-01-24 DIAGNOSIS — I10 Essential (primary) hypertension: Secondary | ICD-10-CM | POA: Diagnosis not present

## 2024-01-24 DIAGNOSIS — Z955 Presence of coronary angioplasty implant and graft: Secondary | ICD-10-CM | POA: Diagnosis not present

## 2024-01-24 DIAGNOSIS — Z01818 Encounter for other preprocedural examination: Secondary | ICD-10-CM

## 2024-01-24 HISTORY — DX: Sleep apnea, unspecified: G47.30

## 2024-01-24 HISTORY — DX: Peripheral vascular disease, unspecified: I73.9

## 2024-01-24 HISTORY — DX: Other complications of anesthesia, initial encounter: T88.59XA

## 2024-01-24 LAB — CBC
HCT: 36.7 % (ref 36.0–46.0)
Hemoglobin: 11.2 g/dL — ABNORMAL LOW (ref 12.0–15.0)
MCH: 25.1 pg — ABNORMAL LOW (ref 26.0–34.0)
MCHC: 30.5 g/dL (ref 30.0–36.0)
MCV: 82.3 fL (ref 80.0–100.0)
Platelets: 262 10*3/uL (ref 150–400)
RBC: 4.46 MIL/uL (ref 3.87–5.11)
RDW: 15.9 % — ABNORMAL HIGH (ref 11.5–15.5)
WBC: 11.3 10*3/uL — ABNORMAL HIGH (ref 4.0–10.5)
nRBC: 0 % (ref 0.0–0.2)

## 2024-01-24 LAB — COMPREHENSIVE METABOLIC PANEL WITH GFR
ALT: 68 U/L — ABNORMAL HIGH (ref 0–44)
AST: 64 U/L — ABNORMAL HIGH (ref 15–41)
Albumin: 3.6 g/dL (ref 3.5–5.0)
Alkaline Phosphatase: 60 U/L (ref 38–126)
Anion gap: 9 (ref 5–15)
BUN: 17 mg/dL (ref 8–23)
CO2: 25 mmol/L (ref 22–32)
Calcium: 9.6 mg/dL (ref 8.9–10.3)
Chloride: 104 mmol/L (ref 98–111)
Creatinine, Ser: 1.05 mg/dL — ABNORMAL HIGH (ref 0.44–1.00)
GFR, Estimated: 58 mL/min — ABNORMAL LOW (ref >60)
Glucose, Bld: 121 mg/dL — ABNORMAL HIGH (ref 70–99)
Potassium: 4 mmol/L (ref 3.5–5.1)
Sodium: 138 mmol/L (ref 135–145)
Total Bilirubin: 0.6 mg/dL (ref 0.0–1.2)
Total Protein: 6.6 g/dL (ref 6.5–8.1)

## 2024-01-24 LAB — TYPE AND SCREEN
ABO/RH(D): AB NEG
Antibody Screen: NEGATIVE

## 2024-01-24 LAB — URINALYSIS, ROUTINE W REFLEX MICROSCOPIC
Bilirubin Urine: NEGATIVE
Glucose, UA: NEGATIVE mg/dL
Hgb urine dipstick: NEGATIVE
Ketones, ur: NEGATIVE mg/dL
Leukocytes,Ua: NEGATIVE
Nitrite: NEGATIVE
Protein, ur: NEGATIVE mg/dL
Specific Gravity, Urine: 1.017 (ref 1.005–1.030)
pH: 6 (ref 5.0–8.0)

## 2024-01-24 LAB — HEMOGLOBIN A1C
Hgb A1c MFr Bld: 6.9 % — ABNORMAL HIGH (ref 4.8–5.6)
Mean Plasma Glucose: 151 mg/dL

## 2024-01-24 LAB — SURGICAL PCR SCREEN
MRSA, PCR: NEGATIVE
Staphylococcus aureus: NEGATIVE

## 2024-01-24 LAB — GLUCOSE, CAPILLARY: Glucose-Capillary: 148 mg/dL — ABNORMAL HIGH (ref 70–99)

## 2024-01-24 LAB — PROTIME-INR
INR: 1 (ref 0.8–1.2)
Prothrombin Time: 13.8 s (ref 11.4–15.2)

## 2024-01-24 LAB — APTT: aPTT: 25 s (ref 24–36)

## 2024-01-24 MED ORDER — PANTOPRAZOLE SODIUM 40 MG PO TBEC
40.0000 mg | DELAYED_RELEASE_TABLET | Freq: Every day | ORAL | 3 refills | Status: DC
Start: 2024-01-24 — End: 2024-02-12

## 2024-01-24 MED ORDER — FLUTICASONE PROPIONATE HFA 110 MCG/ACT IN AERO: 1.0000 | INHALATION_SPRAY | Freq: Two times a day (BID) | RESPIRATORY_TRACT | 12 refills | Status: DC

## 2024-01-24 MED ORDER — PREDNISONE 50 MG PO TABS: ORAL_TABLET | ORAL | 1 refills | Status: DC

## 2024-01-24 MED ORDER — AZITHROMYCIN 250 MG PO TABS: ORAL_TABLET | ORAL | 0 refills | Status: DC

## 2024-01-24 NOTE — Progress Notes (Signed)
 Telephone call Continued cough Has carotid surgery scheduled Friday 23rd. Got 40% better with [pevious steroids. Started after trip to [place]---did not go outside of the resort, but her grandson who she was with recently dx "walking pneumonis". She really want to get surgery done. We will do full court press. Abx coverage for Mycoplasma, CXR, repeat oral steroids, add MDI. She will f/u with me Monday.

## 2024-01-24 NOTE — Progress Notes (Signed)
 PCP - Dr. Violetta Grice Cardiologist - Dr. Eilleen Grates - Last office visit 01/15/2024  PPM/ICD - Denies Device Orders - n/a Rep Notified - n/a  Chest x-ray - n/a EKG - 01/15/2024 Stress Test - Per pt, years ago ECHO - 07/20/2023 Cardiac Cath - 07/16/2018 (CE)  Sleep Study - +OSA. Pt wears CPAP nightly, but not aware of pressure settings  Pt is DM2. She checks her blood sugar once per month. She does not know normal fasting range. CBG at pre-op appointment 148. A1c result pending.  Last dose of GLP1 agonist- n/a GLP1 instructions: n/a  Blood Thinner Instructions: Pt instructed to continue taking Plavix  through morning of surgery Aspirin  Instructions: Pt instructed to continue taking ASA through morning of surgery  NPO after midnight  COVID TEST- n/a   Anesthesia review: Yes. Cardiac Clearance. Pt also recently treated for dry, non-productive cough with augmentin  and prednisone . Pt completed both courses prior to PAT appointment, but still has dry cough (although not as bad as previously). Discussed with Rudy Costain, PA-C who saw the pt during visit. PA will follow-up with pt next week to evaluate symptoms for surgery. Pt also has a virtual visit with her PCP later today to follow-up on cough.   Patient denies shortness of breath, fever, cough and chest pain at PAT appointment. Pt denies any respiratory illness/infection in the last two months.    All instructions explained to the patient, with a verbal understanding of the material. Patient agrees to go over the instructions while at home for a better understanding. Patient also instructed to self quarantine after being tested for COVID-19. The opportunity to ask questions was provided.

## 2024-01-25 ENCOUNTER — Ambulatory Visit
Admission: RE | Admit: 2024-01-25 | Discharge: 2024-01-25 | Disposition: A | Discharge status: Home / Self Care | Source: Ambulatory Visit | Attending: Family Medicine | Admitting: Family Medicine

## 2024-01-25 ENCOUNTER — Ambulatory Visit: Payer: Self-pay | Admitting: Family Medicine

## 2024-01-25 DIAGNOSIS — R059 Cough, unspecified: Secondary | ICD-10-CM | POA: Diagnosis not present

## 2024-01-25 DIAGNOSIS — R052 Subacute cough: Secondary | ICD-10-CM

## 2024-01-26 ENCOUNTER — Other Ambulatory Visit: Payer: Self-pay | Admitting: Family Medicine

## 2024-01-29 ENCOUNTER — Other Ambulatory Visit: Payer: Self-pay | Admitting: Family Medicine

## 2024-01-29 MED ORDER — FLUOXETINE HCL (PMDD) 20 MG PO TABS
1.0000 | ORAL_TABLET | Freq: Every day | ORAL | 3 refills | Status: AC
Start: 2024-01-29 — End: ?

## 2024-01-29 NOTE — Progress Notes (Signed)
 There was evidently some problem with her fluoxetine  prescription so I will send it in again.

## 2024-01-29 NOTE — Progress Notes (Signed)
 Anesthesia Chart Review:  68 year old female follows with cardiology for hx of CAD s/p NSTEMI 07/2018 treated with overlapping DES x 2 to the RCA and subsequent CABG 08/2018 (LIMA to LAD and SVG to the RI), carotid stenosis, mild aortic stenosis, OSA on CPAP, mild pulm hypertension, HTN, HLD.  Echo 07/2023 showed EF 60 to 65%, mildly elevated PASP, RVSP 45, mild MR, trivial AI, mild TR (results state "no aortic stenosis present", however, aortic valve mean gradient measured 10.2 mmHg with AVA 1.09 cm).  Seen by Marlyse Single, PA-C on 01/15/2024 for preop evaluation.  Per note, "[name]'s perioperative risk of a major cardiac event is 0.9% according to the Revised Cardiac Risk Index (RCRI).  Therefore, she is at low risk for perioperative complications.   Her functional capacity is fair at 4.74 METs according to the Duke Activity Status Index (DASI). Recommendations: According to ACC/AHA guidelines, no further cardiovascular testing needed.  The patient may proceed to surgery at acceptable risk. Antiplatelet and/or Anticoagulation Recommendations: Ideally, the patient should remain on ASA without interruption. However, if the bleeding risk is too great, Aspirin  can be held for 7 days prior to her surgery.  Please resume Aspirin  post operatively when it is felt to be safe from a bleeding standpoint. Clopidogrel  is managed by vascular surgery."  Moderate carotid stenosis initially noted on Dopplers done prior to her CABG in 2019.  Recent follow-up duplex 11/27/2023 showed progression with 80 to 99% stenosis in the LICA (RICA was 1 to 39%).  Dr. Lavonne Prairie and referred the patient to vascular surgery and seen by Dr. Rosalva Comber who recommended carotid endarterectomy for asymptomatic critical stenosis.  I evaluated patient at her PAT appointment due to report of persistent cough.  She states she traveled to [place] in early April for a week and had a dry, nonproductive cough when she returned home.  She did state she was  taking care of a sick grandchild during the trip.  She was seen in urgent care on 01/09/2024 and prescribed Augmentin  and Tessalon  Perles.  She completed the course of Augmentin  and noted some improvement; did not find the Tessalon  Perles helpful.  She subsequently completed a course of oral steroids with some additional improvement but not complete resolution of her cough.  Today she states she generally feels well, denies fever, fatigue, shortness of breath.  She just says that she continues to have paroxysms of coughing sporadically during the day.  On exam today she is well-appearing, no shortness of breath at rest, she did have 1 episode of coughing during our conversation.  Her lungs are clear bilaterally.  Heart regular rate and rhythm with 2 out of 6 systolic murmur.  Discussed with patient my suspicion is that she has some postinfectious airway irritability.  Concern for current infection is low.  She may benefit from an albuterol  inhaler or additional course of steroids.  I do not think she needs a chest x-ray at this time.  She does states she has a telephone visit scheduled with her PCP this afternoon.  I advised I would on follow-up on this.  Patient had phone visit with Dr. Luevenia Saha on 12/26/2023.  Per note, "Continued cough. Has carotid surgery scheduled Friday 23rd. Got 40% better with [pevious steroids. Started after trip to [place]---did not go outside of the resort, but her grandson who she was with recently dx "walking pneumonis". She really want to get surgery done. We will do full court press. Abx coverage for Mycoplasma, CXR, repeat oral  steroids, add MDI. She will f/u with me Monday."  CXR 01/25/2024 showed no active cardiopulmonary disease.  I called the patient on 01/30/2024 to follow-up.  She states she completed Z-Pak and is finishing her course of steroids and her cough is significantly improved.  She states she still has some mild coughing in the morning but otherwise no significant  symptoms.  She did state she was not able to pick up the inhaler that was prescribed by her PCP due to insurance issues, but at this point she does not feel like she needs it.  I advised the patient that if she continues on this trajectory of improvement I anticipate she will be able to proceed with surgery as planned.  Non-insulin -dependent DM2, A1c 6.9 on preop labs.  Preop labs reviewed, stable mild elevations of AST/ALT at 64/68, mild anemia Hgb 11.2, otherwise unremarkable.   EKG 01/15/2024: Normal sinus rhythm.  Rate 65. Possible Anterior infarct. No significant change since last tracing  CHEST - 2 VIEW 01/25/2024:   COMPARISON:  09/27/2018 and older studies.   FINDINGS: Cardiac silhouette is mildly enlarged. There stable changes from prior CABG surgery. No mediastinal or hilar masses. No evidence of adenopathy.   Clear lungs.  No pleural effusion or pneumothorax.   Skeletal structures are intact.   IMPRESSION: No active cardiopulmonary disease.  Carotid duplex 11/27/2023: Summary:  Right Carotid: Velocities in the right ICA are consistent with a 1-39% stenosis.  Left Carotid: Velocities in the left ICA are consistent with a 80-99% stenosis.    Vertebrals:  Bilateral vertebral arteries demonstrate antegrade flow.  Subclavians: Normal flow hemodynamics were seen in bilateral subclavian arteries.   TTE 07/20/2023:  1. Left ventricular ejection fraction, by estimation, is 60 to 65%. The  left ventricle has normal function. The left ventricle has no regional  wall motion abnormalities. Left ventricular diastolic parameters were  normal.   2. Right ventricular systolic function is normal. The right ventricular  size is normal. There is mildly elevated pulmonary artery systolic  pressure. The estimated right ventricular systolic pressure is 45.0 mmHg.   3. The mitral valve is normal in structure. Mild mitral valve  regurgitation. No evidence of mitral stenosis.   4. The aortic  valve was not well visualized. Aortic valve regurgitation  is trivial. No aortic stenosis is present.   5. The inferior vena cava is normal in size with greater than 50%  respiratory variability, suggesting right atrial pressure of 3 mmHg.     Edilia Gordon [facility] Short Stay Center/Anesthesiology Phone [phone] 01/30/2024 11:16 AM

## 2024-01-30 NOTE — Anesthesia Preprocedure Evaluation (Addendum)
 Anesthesia Evaluation  Patient identified by MRN, date of birth, ID band Patient awake    Reviewed: Allergy & Precautions, NPO status , Patient's Chart, lab work & pertinent test results, reviewed documented beta blocker date and time   History of Anesthesia Complications Negative for: history of anesthetic complications  Airway Mallampati: III  TM Distance: >3 FB Neck ROM: Full    Dental  (+) Dental Advisory Given, Edentulous Upper   Pulmonary sleep apnea and Continuous Positive Airway Pressure Ventilation , Recent URI , Residual Cough, former smoker phone visit with Dr. Luevenia Saha on 12/26/2023.  Per note, "Continued cough. Has carotid surgery scheduled Friday 23rd. Got 40% better with [pevious steroids. Started after trip to [place]---did not go outside of the resort, but her grandson who she was with recently dx "walking pneumonis". She really want to get surgery done. We will do full court press. Abx coverage for Mycoplasma, CXR, repeat oral steroids, add MDI. She will f/u with me Monday."   Pulmonary exam normal breath sounds clear to auscultation       Cardiovascular hypertension (173/84 preop), Pt. on medications and Pt. on home beta blockers + CAD, + Past MI, + Cardiac Stents (2019), + CABG (2019) and + Peripheral Vascular Disease  Normal cardiovascular exam(-) dysrhythmias  Rhythm:Regular Rate:Normal  80-99% stenosis in the LICA (RICA was 1-39%)  Echo 07/2023 showed EF 60 to 65%, mildly elevated PASP, RVSP 45, mild MR, trivial AI, mild TR (results state "no aortic stenosis present", however, aortic valve mean gradient measured 10.2 mmHg with AVA 1.09 cm)   Neuro/Psych  PSYCHIATRIC DISORDERS Anxiety Depression       GI/Hepatic Neg liver ROS,GERD  Medicated and Controlled,,  Endo/Other  diabetes (a1c 6.9), Well Controlled, Type 2  Class 3 obesity (BMI 41)  Renal/GU Renal InsufficiencyRenal disease (CrCl57.4)  negative  genitourinary   Musculoskeletal  (+) Arthritis ,    Abdominal  (+) + obese  Peds  Hematology  (+) Blood dyscrasia (Hgb 11.2), anemia Hb 11.2   Anesthesia Other Findings Day of surgery medications reviewed with patient.  Reproductive/Obstetrics negative OB ROS                             Anesthesia Physical Anesthesia Plan  ASA: 3  Anesthesia Plan: General   Post-op Pain Management: Tylenol  PO (pre-op)*   Induction: Intravenous  PONV Risk Score and Plan: 3 and Treatment may vary due to age or medical condition, Ondansetron , Dexamethasone  and Midazolam   Airway Management Planned: Oral ETT  Additional Equipment: Arterial line  Intra-op Plan:   Post-operative Plan: Extubation in OR  Informed Consent: I have reviewed the patients History and Physical, chart, labs and discussed the procedure including the risks, benefits and alternatives for the proposed anesthesia with the patient or authorized representative who has indicated his/her understanding and acceptance.     Dental advisory given  Plan Discussed with: CRNA  Anesthesia Plan Comments: (  )        Anesthesia Quick Evaluation

## 2024-01-31 ENCOUNTER — Other Ambulatory Visit: Payer: Self-pay | Admitting: Cardiology

## 2024-01-31 DIAGNOSIS — G4733 Obstructive sleep apnea (adult) (pediatric): Secondary | ICD-10-CM | POA: Diagnosis not present

## 2024-02-01 ENCOUNTER — Other Ambulatory Visit: Payer: Self-pay | Admitting: Family Medicine

## 2024-02-01 ENCOUNTER — Other Ambulatory Visit: Payer: Self-pay | Admitting: Cardiology

## 2024-02-02 ENCOUNTER — Inpatient Hospital Stay (HOSPITAL_COMMUNITY): Admitting: Physician Assistant

## 2024-02-02 ENCOUNTER — Encounter (HOSPITAL_COMMUNITY): Payer: Self-pay | Admitting: Vascular Surgery

## 2024-02-02 ENCOUNTER — Encounter (HOSPITAL_COMMUNITY)
Admission: RE | Disposition: A | Discharge status: Home / Self Care | Payer: Self-pay | Source: Home / Self Care | Attending: Vascular Surgery

## 2024-02-02 ENCOUNTER — Encounter: Payer: Self-pay | Admitting: Family Medicine

## 2024-02-02 ENCOUNTER — Inpatient Hospital Stay (HOSPITAL_COMMUNITY)
Admission: RE | Admit: 2024-02-02 | Discharge: 2024-02-03 | DRG: 038 | Disposition: A | Discharge status: Home / Self Care | Attending: Vascular Surgery | Admitting: Vascular Surgery

## 2024-02-02 ENCOUNTER — Inpatient Hospital Stay (HOSPITAL_COMMUNITY): Payer: Self-pay | Admitting: Physician Assistant

## 2024-02-02 ENCOUNTER — Other Ambulatory Visit: Payer: Self-pay

## 2024-02-02 DIAGNOSIS — I2510 Atherosclerotic heart disease of native coronary artery without angina pectoris: Secondary | ICD-10-CM

## 2024-02-02 DIAGNOSIS — I10 Essential (primary) hypertension: Secondary | ICD-10-CM

## 2024-02-02 DIAGNOSIS — N182 Chronic kidney disease, stage 2 (mild): Secondary | ICD-10-CM | POA: Diagnosis present

## 2024-02-02 DIAGNOSIS — I6522 Occlusion and stenosis of left carotid artery: Secondary | ICD-10-CM | POA: Diagnosis not present

## 2024-02-02 DIAGNOSIS — Z951 Presence of aortocoronary bypass graft: Secondary | ICD-10-CM | POA: Diagnosis not present

## 2024-02-02 DIAGNOSIS — Z7982 Long term (current) use of aspirin: Secondary | ICD-10-CM

## 2024-02-02 DIAGNOSIS — E1151 Type 2 diabetes mellitus with diabetic peripheral angiopathy without gangrene: Secondary | ICD-10-CM | POA: Diagnosis present

## 2024-02-02 DIAGNOSIS — E66813 Obesity, class 3: Secondary | ICD-10-CM | POA: Diagnosis present

## 2024-02-02 DIAGNOSIS — Z7984 Long term (current) use of oral hypoglycemic drugs: Secondary | ICD-10-CM

## 2024-02-02 DIAGNOSIS — Z6841 Body Mass Index (BMI) 40.0 and over, adult: Secondary | ICD-10-CM | POA: Diagnosis not present

## 2024-02-02 DIAGNOSIS — E785 Hyperlipidemia, unspecified: Secondary | ICD-10-CM | POA: Diagnosis present

## 2024-02-02 DIAGNOSIS — I252 Old myocardial infarction: Secondary | ICD-10-CM | POA: Diagnosis not present

## 2024-02-02 DIAGNOSIS — K219 Gastro-esophageal reflux disease without esophagitis: Secondary | ICD-10-CM | POA: Diagnosis present

## 2024-02-02 DIAGNOSIS — Z87891 Personal history of nicotine dependence: Secondary | ICD-10-CM

## 2024-02-02 DIAGNOSIS — E1122 Type 2 diabetes mellitus with diabetic chronic kidney disease: Secondary | ICD-10-CM | POA: Diagnosis not present

## 2024-02-02 DIAGNOSIS — E119 Type 2 diabetes mellitus without complications: Secondary | ICD-10-CM

## 2024-02-02 DIAGNOSIS — Z7902 Long term (current) use of antithrombotics/antiplatelets: Secondary | ICD-10-CM | POA: Diagnosis not present

## 2024-02-02 DIAGNOSIS — E1169 Type 2 diabetes mellitus with other specified complication: Secondary | ICD-10-CM | POA: Diagnosis present

## 2024-02-02 DIAGNOSIS — Z9889 Other specified postprocedural states: Principal | ICD-10-CM

## 2024-02-02 DIAGNOSIS — Z955 Presence of coronary angioplasty implant and graft: Secondary | ICD-10-CM

## 2024-02-02 DIAGNOSIS — I129 Hypertensive chronic kidney disease with stage 1 through stage 4 chronic kidney disease, or unspecified chronic kidney disease: Secondary | ICD-10-CM | POA: Diagnosis present

## 2024-02-02 DIAGNOSIS — I6529 Occlusion and stenosis of unspecified carotid artery: Secondary | ICD-10-CM | POA: Diagnosis present

## 2024-02-02 HISTORY — PX: PATCH ANGIOPLASTY: SHX6230

## 2024-02-02 HISTORY — PX: ENDARTERECTOMY: SHX5162

## 2024-02-02 LAB — GLUCOSE, CAPILLARY
Glucose-Capillary: 115 mg/dL — ABNORMAL HIGH (ref 70–99)
Glucose-Capillary: 162 mg/dL — ABNORMAL HIGH (ref 70–99)
Glucose-Capillary: 339 mg/dL — ABNORMAL HIGH (ref 70–99)
Glucose-Capillary: 310 mg/dL — ABNORMAL HIGH (ref 70–99)

## 2024-02-02 LAB — POCT ACTIVATED CLOTTING TIME
Activated Clotting Time: 262 s
Activated Clotting Time: 233 s

## 2024-02-02 SURGERY — ENDARTERECTOMY, CAROTID
Anesthesia: General | Site: Neck | Laterality: Left

## 2024-02-02 MED ORDER — HYDRALAZINE HCL 20 MG/ML IJ SOLN: 5.0000 mg | INTRAMUSCULAR | Status: DC | PRN

## 2024-02-02 MED ORDER — LIDOCAINE-EPINEPHRINE (PF) 1 %-1:200000 IJ SOLN
INTRAMUSCULAR | Status: AC
  Filled 2024-02-02: qty 30

## 2024-02-02 MED ORDER — HEMOSTATIC AGENTS (NO CHARGE) OPTIME
TOPICAL | Status: DC | PRN
Start: 2024-02-02 — End: 2024-02-02
  Administered 2024-02-02 (×2): 1 via TOPICAL

## 2024-02-02 MED ORDER — ALLOPURINOL 100 MG PO TABS: 100.0000 mg | ORAL_TABLET | Freq: Every day | ORAL | Status: DC

## 2024-02-02 MED ORDER — ALLOPURINOL 100 MG PO TABS: 100.0000 mg | ORAL_TABLET | ORAL | Status: DC

## 2024-02-02 MED ORDER — ACETAMINOPHEN 325 MG PO TABS
325.0000 mg | ORAL_TABLET | ORAL | Status: DC | PRN
  Administered 2024-02-03: 650 mg via ORAL
  Filled 2024-02-02: qty 2

## 2024-02-02 MED ORDER — FENTANYL CITRATE (PF) 250 MCG/5ML IJ SOLN
INTRAMUSCULAR | Status: DC | PRN
  Administered 2024-02-02 (×2): 50 ug via INTRAVENOUS
  Administered 2024-02-02: 25 ug via INTRAVENOUS
  Administered 2024-02-02 (×2): 50 ug via INTRAVENOUS

## 2024-02-02 MED ORDER — METOPROLOL TARTRATE 5 MG/5ML IV SOLN: 2.0000 mg | INTRAVENOUS | Status: DC | PRN

## 2024-02-02 MED ORDER — CHLORHEXIDINE GLUCONATE 0.12 % MT SOLN
15.0000 mL | Freq: Once | OROMUCOSAL | Status: AC
  Administered 2024-02-02: 15 mL via OROMUCOSAL
  Filled 2024-02-02: qty 15

## 2024-02-02 MED ORDER — PHENYLEPHRINE HCL-NACL 20-0.9 MG/250ML-% IV SOLN
INTRAVENOUS | Status: AC
  Filled 2024-02-02: qty 500

## 2024-02-02 MED ORDER — GUAIFENESIN-DM 100-10 MG/5ML PO SYRP: 15.0000 mL | ORAL_SOLUTION | ORAL | Status: DC | PRN

## 2024-02-02 MED ORDER — MIDAZOLAM HCL 2 MG/2ML IJ SOLN
INTRAMUSCULAR | Status: AC
  Filled 2024-02-02: qty 2

## 2024-02-02 MED ORDER — CHLORHEXIDINE GLUCONATE CLOTH 2 % EX PADS
6.0000 | MEDICATED_PAD | Freq: Once | CUTANEOUS | Status: DC
Start: 2024-02-02 — End: 2024-02-02

## 2024-02-02 MED ORDER — MAGNESIUM SULFATE 2 GM/50ML IV SOLN: 2.0000 g | Freq: Every day | INTRAVENOUS | Status: DC | PRN

## 2024-02-02 MED ORDER — LIDOCAINE 2% (20 MG/ML) 5 ML SYRINGE
INTRAMUSCULAR | Status: DC | PRN
Start: 2024-02-02 — End: 2024-02-02
  Administered 2024-02-02: 60 mg via INTRAVENOUS

## 2024-02-02 MED ORDER — DEXAMETHASONE SODIUM PHOSPHATE 10 MG/ML IJ SOLN
INTRAMUSCULAR | Status: AC
  Filled 2024-02-02: qty 1

## 2024-02-02 MED ORDER — DEXAMETHASONE SODIUM PHOSPHATE 10 MG/ML IJ SOLN
INTRAMUSCULAR | Status: DC | PRN
  Administered 2024-02-02: 5 mg via INTRAVENOUS

## 2024-02-02 MED ORDER — POTASSIUM CHLORIDE CRYS ER 20 MEQ PO TBCR: 20.0000 meq | EXTENDED_RELEASE_TABLET | Freq: Every day | ORAL | Status: DC | PRN

## 2024-02-02 MED ORDER — HYDROMORPHONE HCL 1 MG/ML IJ SOLN: 0.2500 mg | INTRAMUSCULAR | Status: DC | PRN

## 2024-02-02 MED ORDER — PROPOFOL 10 MG/ML IV BOLUS
INTRAVENOUS | Status: AC
  Filled 2024-02-02: qty 20

## 2024-02-02 MED ORDER — SODIUM CHLORIDE 0.9 % IV SOLN: 500.0000 mL | Freq: Once | INTRAVENOUS | Status: DC | PRN

## 2024-02-02 MED ORDER — HEPARIN 6000 UNIT IRRIGATION SOLUTION
Status: DC | PRN
  Administered 2024-02-02: 1

## 2024-02-02 MED ORDER — METOPROLOL SUCCINATE ER 25 MG PO TB24
25.0000 mg | ORAL_TABLET | Freq: Two times a day (BID) | ORAL | Status: DC
  Administered 2024-02-02 – 2024-02-03 (×2): 25 mg via ORAL
  Filled 2024-02-02 (×2): qty 1

## 2024-02-02 MED ORDER — ACETAMINOPHEN 500 MG PO TABS
1000.0000 mg | ORAL_TABLET | Freq: Once | ORAL | Status: AC
  Administered 2024-02-02: 1000 mg via ORAL
  Filled 2024-02-02: qty 2

## 2024-02-02 MED ORDER — PHENYLEPHRINE HCL-NACL 20-0.9 MG/250ML-% IV SOLN
INTRAVENOUS | Status: DC | PRN
  Administered 2024-02-02: 10 ug/min via INTRAVENOUS

## 2024-02-02 MED ORDER — HEPARIN 6000 UNIT IRRIGATION SOLUTION
Status: AC
  Filled 2024-02-02: qty 500

## 2024-02-02 MED ORDER — ACETAMINOPHEN 650 MG RE SUPP: 325.0000 mg | RECTAL | Status: DC | PRN

## 2024-02-02 MED ORDER — SODIUM CHLORIDE 0.9 % IV SOLN
0.0125 ug/kg/min | Freq: Once | INTRAVENOUS | Status: AC
  Administered 2024-02-02: .1 ug/kg/min via INTRAVENOUS
  Filled 2024-02-02: qty 2000

## 2024-02-02 MED ORDER — CHLORHEXIDINE GLUCONATE CLOTH 2 % EX PADS: 6.0000 | MEDICATED_PAD | Freq: Once | CUTANEOUS | Status: DC

## 2024-02-02 MED ORDER — PROTAMINE SULFATE 10 MG/ML IV SOLN
INTRAVENOUS | Status: DC | PRN
Start: 2024-02-02 — End: 2024-02-02
  Administered 2024-02-02: 10 mg via INTRAVENOUS
  Administered 2024-02-02: 40 mg via INTRAVENOUS

## 2024-02-02 MED ORDER — SODIUM CHLORIDE 0.9 % IV SOLN: INTRAVENOUS | Status: AC

## 2024-02-02 MED ORDER — ONDANSETRON HCL 4 MG/2ML IJ SOLN
INTRAMUSCULAR | Status: DC | PRN
  Administered 2024-02-02 (×2): 4 mg via INTRAVENOUS

## 2024-02-02 MED ORDER — LIDOCAINE HCL (PF) 1 % IJ SOLN
INTRAMUSCULAR | Status: AC
  Filled 2024-02-02: qty 30

## 2024-02-02 MED ORDER — 0.9 % SODIUM CHLORIDE (POUR BTL) OPTIME
TOPICAL | Status: DC | PRN
  Administered 2024-02-02: 2000 mL

## 2024-02-02 MED ORDER — ROCURONIUM BROMIDE 10 MG/ML (PF) SYRINGE
PREFILLED_SYRINGE | INTRAVENOUS | Status: AC
  Filled 2024-02-02: qty 10

## 2024-02-02 MED ORDER — LIDOCAINE-EPINEPHRINE (PF) 1 %-1:200000 IJ SOLN: INTRAMUSCULAR | Status: DC | PRN

## 2024-02-02 MED ORDER — SODIUM CHLORIDE 0.9 % IV SOLN: INTRAVENOUS | Status: DC

## 2024-02-02 MED ORDER — LACTATED RINGERS IV SOLN: INTRAVENOUS | Status: DC

## 2024-02-02 MED ORDER — ALLOPURINOL 100 MG PO TABS
50.0000 mg | ORAL_TABLET | ORAL | Status: DC
  Administered 2024-02-02: 50 mg via ORAL
  Filled 2024-02-02: qty 1

## 2024-02-02 MED ORDER — COLCHICINE 0.6 MG PO TABS
0.6000 mg | ORAL_TABLET | Freq: Every day | ORAL | Status: DC
  Administered 2024-02-03: 0.6 mg via ORAL
  Filled 2024-02-02: qty 1

## 2024-02-02 MED ORDER — PROPOFOL 1000 MG/100ML IV EMUL
INTRAVENOUS | Status: AC
  Filled 2024-02-02: qty 200

## 2024-02-02 MED ORDER — EZETIMIBE 10 MG PO TABS
10.0000 mg | ORAL_TABLET | Freq: Every day | ORAL | Status: DC
  Administered 2024-02-02 – 2024-02-03 (×2): 10 mg via ORAL
  Filled 2024-02-02 (×2): qty 1

## 2024-02-02 MED ORDER — CLOPIDOGREL BISULFATE 75 MG PO TABS: 75.0000 mg | ORAL_TABLET | Freq: Every day | ORAL | Status: DC

## 2024-02-02 MED ORDER — HEPARIN SODIUM (PORCINE) 1000 UNIT/ML IJ SOLN
INTRAMUSCULAR | Status: DC | PRN
  Administered 2024-02-02: 1000 [IU] via INTRAVENOUS
  Administered 2024-02-02: 10000 [IU] via INTRAVENOUS

## 2024-02-02 MED ORDER — ONDANSETRON HCL 4 MG/2ML IJ SOLN: 4.0000 mg | Freq: Four times a day (QID) | INTRAMUSCULAR | Status: DC | PRN

## 2024-02-02 MED ORDER — EPHEDRINE SULFATE-NACL 50-0.9 MG/10ML-% IV SOSY
PREFILLED_SYRINGE | INTRAVENOUS | Status: DC | PRN
  Administered 2024-02-02: 5 mg via INTRAVENOUS
  Administered 2024-02-02: 2.5 mg via INTRAVENOUS

## 2024-02-02 MED ORDER — LABETALOL HCL 5 MG/ML IV SOLN: 10.0000 mg | INTRAVENOUS | Status: DC | PRN

## 2024-02-02 MED ORDER — ROCURONIUM BROMIDE 10 MG/ML (PF) SYRINGE
PREFILLED_SYRINGE | INTRAVENOUS | Status: DC | PRN
Start: 2024-02-02 — End: 2024-02-02
  Administered 2024-02-02: 80 mg via INTRAVENOUS

## 2024-02-02 MED ORDER — FENTANYL CITRATE (PF) 250 MCG/5ML IJ SOLN
INTRAMUSCULAR | Status: AC
  Filled 2024-02-02: qty 5

## 2024-02-02 MED ORDER — DROPERIDOL 2.5 MG/ML IJ SOLN: 0.6250 mg | Freq: Once | INTRAMUSCULAR | Status: DC | PRN

## 2024-02-02 MED ORDER — LIDOCAINE HCL (CARDIAC) PF 100 MG/5ML IV SOSY
PREFILLED_SYRINGE | INTRAVENOUS | Status: DC | PRN
  Administered 2024-02-02: 5 mL via INTRATRACHEAL

## 2024-02-02 MED ORDER — SUGAMMADEX SODIUM 200 MG/2ML IV SOLN
INTRAVENOUS | Status: DC | PRN
  Administered 2024-02-02: 200 mg via INTRAVENOUS

## 2024-02-02 MED ORDER — HYDROCODONE-ACETAMINOPHEN 5-325 MG PO TABS: 1.0000 | ORAL_TABLET | ORAL | Status: DC | PRN

## 2024-02-02 MED ORDER — ALUM & MAG HYDROXIDE-SIMETH 200-200-20 MG/5ML PO SUSP: 15.0000 mL | ORAL | Status: DC | PRN

## 2024-02-02 MED ORDER — CEFAZOLIN SODIUM-DEXTROSE 2-4 GM/100ML-% IV SOLN
2.0000 g | Freq: Three times a day (TID) | INTRAVENOUS | Status: AC
  Administered 2024-02-02 – 2024-02-03 (×2): 2 g via INTRAVENOUS
  Filled 2024-02-02 (×2): qty 100

## 2024-02-02 MED ORDER — CEFAZOLIN SODIUM-DEXTROSE 2-4 GM/100ML-% IV SOLN
2.0000 g | INTRAVENOUS | Status: AC
  Administered 2024-02-02: 2 g via INTRAVENOUS
  Filled 2024-02-02: qty 100

## 2024-02-02 MED ORDER — INSULIN ASPART 100 UNIT/ML IJ SOLN: 0.0000 [IU] | INTRAMUSCULAR | Status: DC | PRN

## 2024-02-02 MED ORDER — DEXMEDETOMIDINE HCL IN NACL 80 MCG/20ML IV SOLN
INTRAVENOUS | Status: DC | PRN
  Administered 2024-02-02: 4 ug via INTRAVENOUS
  Administered 2024-02-02: 8 ug via INTRAVENOUS

## 2024-02-02 MED ORDER — INSULIN ASPART 100 UNIT/ML IJ SOLN
0.0000 [IU] | Freq: Three times a day (TID) | INTRAMUSCULAR | Status: DC
  Administered 2024-02-02: 11 [IU] via SUBCUTANEOUS
  Administered 2024-02-03: 3 [IU] via SUBCUTANEOUS

## 2024-02-02 MED ORDER — PHENOL 1.4 % MT LIQD: 1.0000 | OROMUCOSAL | Status: DC | PRN

## 2024-02-02 MED ORDER — HYDROMORPHONE HCL 1 MG/ML IJ SOLN: 0.5000 mg | INTRAMUSCULAR | Status: DC | PRN

## 2024-02-02 MED ORDER — DOCUSATE SODIUM 100 MG PO CAPS
100.0000 mg | ORAL_CAPSULE | Freq: Every day | ORAL | Status: DC
  Administered 2024-02-03: 100 mg via ORAL
  Filled 2024-02-02: qty 1

## 2024-02-02 MED ORDER — LIDOCAINE 2% (20 MG/ML) 5 ML SYRINGE
INTRAMUSCULAR | Status: AC
  Filled 2024-02-02: qty 5

## 2024-02-02 MED ORDER — PANTOPRAZOLE SODIUM 40 MG PO TBEC
40.0000 mg | DELAYED_RELEASE_TABLET | Freq: Every day | ORAL | Status: DC
  Administered 2024-02-03: 40 mg via ORAL
  Filled 2024-02-02: qty 1

## 2024-02-02 MED ORDER — ROSUVASTATIN CALCIUM 20 MG PO TABS
40.0000 mg | ORAL_TABLET | Freq: Every day | ORAL | Status: DC
  Administered 2024-02-02: 40 mg via ORAL
  Filled 2024-02-02: qty 2

## 2024-02-02 MED ORDER — SODIUM CHLORIDE 0.9 % IV SOLN: INTRAVENOUS | Status: DC | PRN

## 2024-02-02 MED ORDER — ASPIRIN 81 MG PO TBEC
81.0000 mg | DELAYED_RELEASE_TABLET | Freq: Every day | ORAL | Status: DC
  Administered 2024-02-03: 81 mg via ORAL
  Filled 2024-02-02: qty 1

## 2024-02-02 MED ORDER — PHENYLEPHRINE 80 MCG/ML (10ML) SYRINGE FOR IV PUSH (FOR BLOOD PRESSURE SUPPORT)
PREFILLED_SYRINGE | INTRAVENOUS | Status: DC | PRN
  Administered 2024-02-02: 80 ug via INTRAVENOUS
  Administered 2024-02-02: 40 ug via INTRAVENOUS
  Administered 2024-02-02: 80 ug via INTRAVENOUS
  Administered 2024-02-02: 120 ug via INTRAVENOUS
  Administered 2024-02-02 (×2): 40 ug via INTRAVENOUS

## 2024-02-02 MED ORDER — PROPOFOL 10 MG/ML IV BOLUS
INTRAVENOUS | Status: DC | PRN
  Administered 2024-02-02: 130 mg via INTRAVENOUS

## 2024-02-02 MED ORDER — ORAL CARE MOUTH RINSE: 15.0000 mL | Freq: Once | OROMUCOSAL | Status: AC

## 2024-02-02 SURGICAL SUPPLY — 51 items
BAG COUNTER SPONGE SURGICOUNT (BAG) ×2 IMPLANT
CANISTER SUCTION 3000ML PPV (SUCTIONS) ×2 IMPLANT
CANNULA VESSEL 3MM 2 BLNT TIP (CANNULA) ×2 IMPLANT
CATH ROBINSON RED A/P 18FR (CATHETERS) ×2 IMPLANT
CLIP TI MEDIUM 24 (CLIP) ×2 IMPLANT
CLIP TI MEDIUM 6 (CLIP) ×2 IMPLANT
CLIP TI WIDE RED SMALL 24 (CLIP) ×2 IMPLANT
CLIP TI WIDE RED SMALL 6 (CLIP) ×2 IMPLANT
COVER PROBE W GEL 5X96 (DRAPES) ×2 IMPLANT
COVER TRANSDUCER ULTRASND GEL (DISPOSABLE) ×2 IMPLANT
DERMABOND ADVANCED .7 DNX12 (GAUZE/BANDAGES/DRESSINGS) ×2 IMPLANT
DRAIN CHANNEL 15F RND FF W/TCR (WOUND CARE) IMPLANT
DRSG TEGADERM 4X4.75 (GAUZE/BANDAGES/DRESSINGS) IMPLANT
ELECTRODE REM PT RTRN 9FT ADLT (ELECTROSURGICAL) ×2 IMPLANT
EVACUATOR SILICONE 100CC (DRAIN) IMPLANT
GAUZE SPONGE 2X2 8PLY STRL LF (GAUZE/BANDAGES/DRESSINGS) IMPLANT
GLOVE BIOGEL PI IND STRL 8 (GLOVE) ×2 IMPLANT
GOWN STRL REUS W/ TWL LRG LVL3 (GOWN DISPOSABLE) ×4 IMPLANT
GOWN STRL REUS W/TWL 2XL LVL3 (GOWN DISPOSABLE) ×4 IMPLANT
HEMOSTAT SNOW SURGICEL 2X4 (HEMOSTASIS) IMPLANT
KIT BASIN OR (CUSTOM PROCEDURE TRAY) ×2 IMPLANT
KIT SHUNT ARGYLE CAROTID ART 6 (VASCULAR PRODUCTS) IMPLANT
KIT TURNOVER KIT B (KITS) ×2 IMPLANT
NDL HYPO 25GX1X1/2 BEV (NEEDLE) IMPLANT
NDL SPNL 20GX3.5 QUINCKE YW (NEEDLE) IMPLANT
NEEDLE HYPO 25GX1X1/2 BEV (NEEDLE) IMPLANT
NEEDLE SPNL 20GX3.5 QUINCKE YW (NEEDLE) IMPLANT
NS IRRIG 1000ML POUR BTL (IV SOLUTION) ×6 IMPLANT
PACK CAROTID (CUSTOM PROCEDURE TRAY) ×2 IMPLANT
PAD ARMBOARD POSITIONER FOAM (MISCELLANEOUS) ×4 IMPLANT
PATCH VASC XENOSURE 1X6 (Vascular Products) IMPLANT
POSITIONER HEAD DONUT 9IN (MISCELLANEOUS) ×2 IMPLANT
SET WALTER ACTIVATION W/DRAPE (SET/KITS/TRAYS/PACK) ×2 IMPLANT
SHUNT CAROTID BYPASS 10 (VASCULAR PRODUCTS) IMPLANT
SHUNT CAROTID BYPASS 12FRX15.5 (VASCULAR PRODUCTS) IMPLANT
SPONGE SURGIFOAM ABS GEL 100 (HEMOSTASIS) IMPLANT
STOPCOCK 4 WAY LG BORE MALE ST (IV SETS) IMPLANT
SURGIFLO W/THROMBIN 8M KIT (HEMOSTASIS) IMPLANT
SUT ETHILON 3 0 PS 1 (SUTURE) IMPLANT
SUT MNCRL AB 4-0 PS2 18 (SUTURE) ×2 IMPLANT
SUT PROLENE 6 0 BV (SUTURE) ×2 IMPLANT
SUT PROLENE 7 0 BV1 MDA (SUTURE) IMPLANT
SUT SILK 2 0 PERMA HAND 18 BK (SUTURE) IMPLANT
SUT SILK 3-0 18XBRD TIE 12 (SUTURE) IMPLANT
SUT VIC AB 2-0 CT1 TAPERPNT 27 (SUTURE) ×2 IMPLANT
SUT VIC AB 3-0 SH 27X BRD (SUTURE) ×2 IMPLANT
SYR 20CC LL (SYRINGE) ×4 IMPLANT
SYR CONTROL 10ML LL (SYRINGE) IMPLANT
TOWEL GREEN STERILE (TOWEL DISPOSABLE) ×2 IMPLANT
VASCULAR TIE MINI RED 18IN STL (MISCELLANEOUS) IMPLANT
WATER STERILE IRR 1000ML POUR (IV SOLUTION) ×2 IMPLANT

## 2024-02-02 NOTE — Anesthesia Procedure Notes (Signed)
 Arterial Line Insertion Start/End5/23/2025 7:15 AM Performed by: Jacquelyne Matte, DO, Merna Aase, CRNA, CRNA  Preanesthetic checklist: patient identified, IV checked, site marked, risks and benefits discussed, surgical consent, monitors and equipment checked, pre-op evaluation, timeout performed and anesthesia consent Lidocaine  1% used for infiltration Left, radial was placed  Procedure performed using ultrasound guided technique.

## 2024-02-02 NOTE — Progress Notes (Signed)
Pt arrived from ...PACU.., A/ox ..4.pt denies any pain, MD aware,CCMD called. CHG bath given,no further needs at this time    

## 2024-02-02 NOTE — Anesthesia Procedure Notes (Addendum)
 Procedure Name: Intubation Date/Time: 02/02/2024 7:50 AM  Performed by: Merna Aase, CRNAPre-anesthesia Checklist: Patient identified, Emergency Drugs available, Suction available and Patient being monitored Patient Re-evaluated:Patient Re-evaluated prior to induction Oxygen Delivery Method: Circle system utilized Preoxygenation: Pre-oxygenation with 100% oxygen Induction Type: IV induction Ventilation: Mask ventilation without difficulty Laryngoscope Size: Mac and 3 Grade View: Grade II Tube type: Oral Number of attempts: 1 Airway Equipment and Method: Stylet, Oral airway and LTA kit utilized Placement Confirmation: ETT inserted through vocal cords under direct vision, positive ETCO2 and breath sounds checked- equal and bilateral Secured at: 21 cm Tube secured with: Tape Dental Injury: Teeth and Oropharynx as per pre-operative assessment

## 2024-02-02 NOTE — Op Note (Signed)
 NAME: Pamela Thomas    MRN: [id number] DOB: October 20, 1955    DATE OF OPERATION: 02/02/2024  PREOP DIAGNOSIS:    Asymptomatic left internal carotid artery stenosis  POSTOP DIAGNOSIS:    Same  PROCEDURE:    Left carotid enterectomy with bovine pericardial patch plasty  SURGEON: Kayla Part  ASSIST: Cordie Deters, PA  ANESTHESIA: General  EBL: 50ml  INDICATIONS:    Pamela Thomas is a 68 y.o. female who presents my office with critical left sided asymptomatic ICA stenosis.  I have discussed the risks and benefits of carotid endarterectomy to lower future stroke risk Pamela Thomas elected to proceed.  FINDINGS:   Greater than 90% left ICA stenosis  TECHNIQUE:   DESCRIPTION: After full informed written consent was obtained from the patient, the patient was brought back to the operating room and placed supine upon the operating table.  Prior to induction, the patient received IV antibiotics.  After obtaining adequate anesthesia, the patient was placed into semi-Fowler position with a shoulder roll in place and the patient's neck slightly hyperextended and rotated away from the surgical site.  The patient was prepped in the standard fashion for a left carotid endarterectomy.  I made an incision anterior to the sternocleidomastoid muscle and dissected down through the subcutaneous tissue.  The platysmas was opened with electrocautery.  Then I dissected down to the internal jugular vein.  This was dissected posteriorly until I obtained visualization of the common carotid artery.  This was dissected out and then an umbilical tape was placed around the common carotid artery and I loosely applied a Rumel tourniquet.  I then dissected in a periadventitial fashion along the common carotid artery up to the bifurcation.  I then identified the external carotid artery and the superior thyroid  artery.  A 2-0 silk tie was looped around the superior thyroid  artery, and I also dissected out the  external carotid artery and placed a vessel loop around it.  In continuing the dissection to the internal carotid artery, I identified the facial vein.  This was ligated and then transected, giving me improved exposure of the internal carotid artery.  In the process of this dissection, the hypoglossal nerve was identified.  I then dissected out the internal carotid artery until I identified an area of soft tissue in the internal carotid artery.  I dissected slightly distal to this area, and placed a vessel loop in Potts fashion around the artery.    At this point, the patient was heparinized to an ACT greater than 250.  Artery clamps, and a longitudinal arteriotomy made on the common carotid artery extending into the internal carotid artery.  Greater than 90% stenosis was appreciated in the left ICA.  Endarterectomy followed.  A tacking stitch was placed along the posterior portion of the endarterectomy at the proximal aspect.  At completion, I Baglia the ICA to ensure the patient did not need to be at completion, I backbled the ICA to ensure that the patient did not need to be shunted.  There was excellent, pulsatile backbleeding.  At this point, I then fashioned a bovine pericardial patch for the geometry of this artery and sewed it in place with two running stitch of 6-0 Prolene, one from each end.  Prior to completing this patch angioplasty, I backbled all 3 vessels.  Then I instilled heparinized saline in this patched artery and then completed the patch angioplasty in the usual fashion.  First, I released the clamp on the  external carotid artery, then I released it on the common carotid artery.  After waiting a few seconds, I then released it on the internal carotid artery.  I then interrogated this patient's arteries with the continuous Doppler.  The audible waveforms in each artery were consistent with the expected characteristics for each artery.  The Sonosite probe was then sterilely draped and used to  interrogate the carotid artery in both longitudinal and transverse views.  At this point, I washed out the wound, and placed thrombin product throughout.  I also gave the patient 50 mg of protamine  to reverse his anticoagulation.   After waiting a few minutes, I removed the thrombin and washed out the wound.  There was no more active bleeding in the surgical site.  A 15 [place] Blake drain was placed in the wound bed.  I then reapproximated the platysma muscle with a running stitch of 2-0 Vicryl.  The skin was then reapproximated with a running subcuticular 4-0 Monocryl stitch.  The skin was then cleaned, dried and Dermabond was used to reinforce the skin closure.  The patient woke without any problems, neurologically intact.       Kayla Part, MD [facility] DATE OF DICTATION:   02/02/2024

## 2024-02-02 NOTE — Transfer of Care (Signed)
 Immediate Anesthesia Transfer of Care Note  Patient: Pamela Thomas  Procedure(s) Performed: ENDARTERECTOMY, CAROTID (Left) ANGIOPLASTY, USING PATCH GRAFT USING SIZE 1CM X 6CM (Left: Neck)  Patient Location: PACU  Anesthesia Type:General  Level of Consciousness: awake, drowsy, and patient cooperative  Airway & Oxygen Therapy: Patient Spontanous Breathing and Patient connected to face mask oxygen  Post-op Assessment: Report given to RN, Post -op Vital signs reviewed and stable, and Patient moving all extremities  Post vital signs: Reviewed and stable  Last Vitals:  Vitals Value Taken Time  BP 110/55 02/02/24 1045  Temp    Pulse 74 02/02/24 1052  Resp 18 02/02/24 1052  SpO2 93 % 02/02/24 1052  Vitals shown include unfiled device data.  Last Pain:  Vitals:   02/02/24 0604  PainSc: 0-No pain      Patients Stated Pain Goal: 0 (02/02/24 0558)  Complications: No notable events documented.

## 2024-02-02 NOTE — Plan of Care (Signed)
  Problem: Education: Goal: Knowledge of General Education information will improve Description: Including pain rating scale, medication(s)/side effects and non-pharmacologic comfort measures Outcome: Progressing   Problem: Health Behavior/Discharge Planning: Goal: Ability to manage health-related needs will improve Outcome: Progressing   Problem: Clinical Measurements: Goal: Ability to maintain clinical measurements within normal limits will improve Outcome: Progressing Goal: Will remain free from infection Outcome: Progressing Goal: Diagnostic test results will improve Outcome: Progressing Goal: Respiratory complications will improve Outcome: Progressing Goal: Cardiovascular complication will be avoided Outcome: Progressing   Problem: Activity: Goal: Risk for activity intolerance will decrease Outcome: Progressing   Problem: Nutrition: Goal: Adequate nutrition will be maintained Outcome: Progressing   Problem: Coping: Goal: Level of anxiety will decrease Outcome: Progressing   Problem: Education: Goal: Knowledge of discharge needs will improve Outcome: Progressing   Problem: Ischemic Stroke/TIA Tissue Perfusion: Goal: Complications of ischemic stroke/TIA will be minimized Outcome: Progressing   Problem: Education: Goal: Knowledge of disease or condition will improve Outcome: Progressing Goal: Knowledge of secondary prevention will improve (MUST DOCUMENT ALL) Outcome: Progressing Goal: Knowledge of patient specific risk factors will improve (DELETE if not current risk factor) Outcome: Progressing

## 2024-02-02 NOTE — H&P (Signed)
 Office Note   Patient seen and examined in preop holding.  No complaints. No changes to medication history or physical exam since last seen in clinic. After discussing the risks and benefits of left CEA, Pamela Thomas elected to proceed.   Kayla Part MD   CC: Asymptomatic carotid disease Requesting Provider:  No ref. provider found  HPI: Pamela Thomas is Pamela 68 y.o. ([birth year]) female presenting to discuss recent CT angiogram which was ordered due to asymptomatic left-sided critical ICA stenosis.    On exam, send he was doing well.  Pamela Thomas, she graduated from [employer], but currently lives in the [address] with her husband.  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  In retirement, her and her husband have enjoyed traveling.  She has been to [place] twice in the last 2 months.  At her last trip, she came back with Pamela cough.  This has improved with medication, but is still readily apparent on exam today.  She feels her lungs are clean, but when she starts coughing, she has difficulty stopping.  While on her first  trip in [place], she was called by her cardiologist, Dr. Lavonne Prairie who appreciated cortical left-sided ICA stenosis on recent ultrasound.  The ultrasound was Pamela follow-up study as she has had moderate stenosis since initially tested prior to CABG.  She denies symptoms of TIA, stroke, amaurosis recently, but stated years ago, she thought that she had Pamela TIA.  She is already booked Pamela trip to [place]  this fall now that both her and her husband are retired.  The pt is  on Pamela statin for cholesterol management.  The pt is  on Pamela daily aspirin .   Other AC:  - The pt is  on medication for hypertension.   The pt is  diabetic.  Tobacco hx:  -  Past Medical History:  Diagnosis Date   ALLERGIC RHINITIS, SEASONAL    Anxiety    Back pain    BCC (basal cell carcinoma of skin) 2005   [facility])   Carotid artery  occlusion    Complication of anesthesia    Post operative delirium after CABG   Constipation    Coronary artery disease 2019   DES x2 placed - 2019 CABG x2 - 2019   Depression    Diabetes mellitus without complication (HCC)    DYSLIPIDEMIA    Dysrhythmia    palpitations-evaluated by Dr Stann Earnest 6/12/eccho 6/13   Glucose intolerance (impaired glucose tolerance)    Heart murmur    History of blood transfusion    Hypertension    Joint pain    Myocardial infarction (HCC) 2019   OBESITY, NOS    Osteoarthritis    rt knee   Peripheral vascular disease (HCC)    Carotid Stenosis - left   PLANTAR FASCIITIS, BILATERAL    Sleep apnea    Vertigo    Vitamin D  deficiency     Past Surgical History:  Procedure Laterality Date   BREAST BIOPSY  05/27/2003   left   CARDIAC CATHETERIZATION     CARPAL TUNNEL RELEASE  05/27/2003   Right   CARPAL TUNNEL RELEASE Left    Procedure done in the office   CESAREAN SECTION  05/27/2003   COLONOSCOPY     CORONARY ARTERY BYPASS GRAFT N/Pamela 08/17/2018   Procedure: CORONARY ARTERY BYPASS GRAFTING (CABG) TIMES TWO: LIMA to LAD, SVG to RAMUS INTERMEDIATE)  USING LEFT INTERNAL MAMMARY ARTERY  AND RIGHT GREATER SAPHENOUS VEIN HARVESTED ENDOSCOPICALLY.;  Surgeon: Norita Beauvais, MD;  Location: [facility] OR;  Service: Open Heart Surgery;  Laterality: N/Pamela;   KNEE SURGERY     left- removal fatty tumor   ROTATOR CUFF REPAIR     Bilateral   TEE WITHOUT CARDIOVERSION N/Pamela 08/17/2018   Procedure: TRANSESOPHAGEAL ECHOCARDIOGRAM (TEE);  Surgeon: Norita Beauvais, MD;  Location: [facility] OR;  Service: Open Heart Surgery;  Laterality: N/Pamela;   TONSILLECTOMY     Removed as Pamela child   TOTAL KNEE ARTHROPLASTY  10/10/2012   Procedure: TOTAL KNEE ARTHROPLASTY;  Surgeon: Aurther Blue, MD;  Location: [facility] ORS;  Service: Orthopedics;  Laterality: Right;    Social History   Socioeconomic History   Marital status: Married    Spouse name: Royston Cornea   Number of children: 2   Years of  education: Not on file   Highest education level: 12th grade  Occupational History   Occupation: retired  Tobacco Use   Smoking status: Former    Current packs/day: 0.00    Types: Cigarettes    Quit date: 09/12/1990    Years since quitting: 33.4   Smokeless tobacco: Never  Vaping Use   Vaping status: Never Used  Substance and Sexual Activity   Alcohol use: Yes    Alcohol/week: 0.0 standard drinks of alcohol    Comment: rare   Drug use: No   Sexual activity: Not Currently  Other Topics Concern   Not on file  Social History Narrative   Lives with husband in Pamela split level home.  Has 3 children.     Recently Retired   Highest level of education:  12th grade   Social Drivers of Corporate investment banker Strain: Low Risk  (07/08/2023)   Overall Financial Resource Strain (CARDIA)    Difficulty of Paying Living Expenses: Not hard at all  Food Insecurity: No Food Insecurity (07/08/2023)   Hunger Vital Sign    Worried About Running Out of Food in the Last Year: Never true    Ran Out of Food in the Last Year: Never true  Transportation Needs: No Transportation Needs (07/08/2023)   PRAPARE - Administrator, Civil Service (Medical): No    Lack of Transportation (Non-Medical): No  Physical Activity: Insufficiently Active (07/08/2023)   Exercise Vital Sign    Days of Exercise per Week: 3 days    Minutes of Exercise per Session: 10 min  Stress: No Stress Concern Present (07/08/2023)   [employer] of Occupational Health - Occupational Stress Questionnaire    Feeling of Stress : Not at all  Social Connections: Socially Integrated (07/08/2023)   Social Connection and Isolation Panel [NHANES]    Frequency of Communication with Friends and Family: More than three times Pamela week    Frequency of Social Gatherings with Friends and Family: Once Pamela week    Attends Religious Services: More than 4 times per year    Active Member of [employer] or Organizations: Yes    Attends Museum/gallery exhibitions officer: More than 4 times per year    Marital Status: Married  Catering manager Violence: Not on file   Family History  Problem Relation Age of Onset   COPD Mother        Deceased   High blood pressure Mother    High Cholesterol Mother    Depression Mother    Lung cancer Father        Deceaesd  High Cholesterol Father    Diabetes Other    Melanoma Brother        Deceased   COPD Brother    Healthy Daughter     Current Facility-Administered Medications  Medication Dose Route Frequency Provider Last Rate Last Admin   0.9 %  sodium chloride  infusion   Intravenous Continuous Daniel Johndrow E, MD       ceFAZolin  (ANCEF ) IVPB 2g/100 mL premix  2 g Intravenous 30 min Pre-Op Lilliemae Fruge E, MD       Chlorhexidine  Gluconate Cloth 2 % PADS 6 each  6 each Topical Once Ark Agrusa E, MD       And   Chlorhexidine  Gluconate Cloth 2 % PADS 6 each  6 each Topical Once Rolfe Hartsell E, MD       insulin  aspart (novoLOG ) injection 0-14 Units  0-14 Units Subcutaneous Q2H PRN Finucane, Elizabeth M, DO       lactated ringers  infusion   Intravenous Continuous Ellender, Tommie Frame, MD       remifentanil (ULTIVA) 2 mg in 100 mL normal saline (20 mcg/mL) Optime  0.0125 mcg/kg/min (Adjusted) Intravenous Once Gacuga, Rose, CRNA        Allergies  Allergen Reactions   Icosapent  Ethyl (Epa Ethyl Ester) (Fish) Diarrhea   Statins     Myalgia pain moderate, flu like symptoms.    Morphine      Other Reaction(s): unable to wake up   Morphine  Sulfate     Severe hallucinations, difficult arousal.    Oxycodone  Nausea Only    Has to take nausea medications if taken. Prefer not to take     REVIEW OF SYSTEMS:  [X]  denotes positive finding, [ ]  denotes negative finding Cardiac  Comments:  Chest pain or chest pressure:    Shortness of breath upon exertion:    Short of breath when lying flat:    Irregular heart rhythm:        Vascular    Pain in calf, thigh, or hip brought on by  ambulation:    Pain in feet at night that wakes you up from your sleep:     Blood clot in your veins:    Leg swelling:         Pulmonary    Oxygen at home:    Productive cough:     Wheezing:         Neurologic    Sudden weakness in arms or legs:     Sudden numbness in arms or legs:     Sudden onset of difficulty speaking or slurred speech:    Temporary loss of vision in one eye:     Problems with dizziness:         Gastrointestinal    Blood in stool:     Vomited blood:         Genitourinary    Burning when urinating:     Blood in urine:        Psychiatric    Major depression:         Hematologic    Bleeding problems:    Problems with blood clotting too easily:        Skin    Rashes or ulcers:        Constitutional    Fever or chills:      PHYSICAL EXAMINATION:  Vitals:   02/02/24 0544  BP: (!) 173/84  Pulse: 69  Resp: 18  Temp: 98.6 F (37 C)  SpO2:  97%  Weight: 102 kg  Height: 5\' 2"  (1.575 m)     General:  WDWN in NAD; vital signs documented above Gait: Not observed HENT: WNL, normocephalic Pulmonary: normal non-labored breathing , without wheezing, lungs were clear on auscultation Cardiac: regular HR Abdomen: soft, NT, no masses Skin: without rashes Vascular Exam/Pulses:  Right Left  Radial 2+ (normal) 2+ (normal)                       Extremities: without ischemic changes, without Gangrene , without cellulitis; without open wounds;  Musculoskeletal: no muscle wasting or atrophy  Neurologic: Pamela&O X 3;  No focal weakness or paresthesias are detected Psychiatric:  The pt has Normal affect.   Non-Invasive Vascular Imaging:   Right Carotid Findings:  +----------+--------+--------+--------+-------------------------+--------+           PSV cm/sEDV cm/sStenosisPlaque Description       Comments  +----------+--------+--------+--------+-------------------------+--------+  CCA Prox  62      8                                                   +----------+--------+--------+--------+-------------------------+--------+  CCA Distal62      13                                                 +----------+--------+--------+--------+-------------------------+--------+  ICA Prox  125     29      1-39%   calcific and heterogenous          +----------+--------+--------+--------+-------------------------+--------+  ICA Mid   92      21                                                 +----------+--------+--------+--------+-------------------------+--------+  ICA Distal85      13                                                 +----------+--------+--------+--------+-------------------------+--------+  ECA      100     7                                                  +----------+--------+--------+--------+-------------------------+--------+   +----------+--------+-------+----------------+-------------------+           PSV cm/sEDV cmsDescribe        Arm Pressure (mmHG)  +----------+--------+-------+----------------+-------------------+  [id number]    0      Multiphasic, [id number]                  +----------+--------+-------+----------------+-------------------+   +---------+--------+--+--------+--+---------+  VertebralPSV cm/s35EDV cm/s10Antegrade  +---------+--------+--+--------+--+---------+      Left Carotid Findings:  +----------+--------+--------+--------+------------------+--------+           PSV cm/sEDV cm/sStenosisPlaque DescriptionComments  +----------+--------+--------+--------+------------------+--------+  CCA Prox  74      10                                          +----------+--------+--------+--------+------------------+--------+  CCA Distal54      11                                          +----------+--------+--------+--------+------------------+--------+  ICA Prox  339     120     80-99%  heterogenous                 +----------+--------+--------+--------+------------------+--------+  ICA Mid   270     81                                          +----------+--------+--------+--------+------------------+--------+  ICA Distal119     30                                          +----------+--------+--------+--------+------------------+--------+  ECA      125     1                                           +----------+--------+--------+--------+------------------+--------+   +----------+--------+--------+----------------+-------------------+           PSV cm/sEDV cm/sDescribe        Arm Pressure (mmHG)  +----------+--------+--------+----------------+-------------------+  [id number]    0       Multiphasic, [id number]                  +----------+--------+--------+----------------+-------------------+   +---------+--------+--+--------+-+---------+  VertebralPSV cm/s31EDV cm/s7Antegrade  +---------+--------+--+--------+-+---------+     ASSESSMENT/PLAN: Pamela Thomas is Pamela 67 y.o. female presenting with critical left sided ICA stenosis.  Fortunately, she is asymptomatic.  Pamela Thomas and I had Pamela long discussion regarding asymptomatic internal carotid artery stenosis, most notably the risks and benefits of surgery.  She is aware that she has an 11% risk of stroke over the next 5 years, which can be reduced to 5% with cerebrovascular revascularization.  She is aware that there are multiple ways to revascularize.  Unfortunately, she has Pamela short runway, and is not Pamela candidate for transcarotid artery revascularization.  She would be best served with carotid endarterectomy.  After discussing risks and benefits of left-sided carotid endarterectomy for asymptomatic critical ICA stenosis, Pamela Thomas elected to proceed.  She will be scheduled after preoperative risk stratification with Dr. Lavonne Prairie. I would like for her to get over her cough prior to surgery as well. I plan to forward my  note to her primary, Dr. Andree Kayser.  Kayla Part, MD [facility] [phone] Total time of patient care including pre-visit research, consultation, and documentation greater than 30 minutes

## 2024-02-02 NOTE — Plan of Care (Signed)
  Problem: Education: Goal: Knowledge of General Education information will improve Description: Including pain rating scale, medication(s)/side effects and non-pharmacologic comfort measures Outcome: Progressing   Problem: Clinical Measurements: Goal: Ability to maintain clinical measurements within normal limits will improve Outcome: Progressing   Problem: Clinical Measurements: Goal: Cardiovascular complication will be avoided Outcome: Progressing   Problem: Activity: Goal: Risk for activity intolerance will decrease Outcome: Progressing   Problem: Skin Integrity: Goal: Demonstration of wound healing without infection will improve Outcome: Progressing   Problem: Education: Goal: Knowledge of disease or condition will improve Outcome: Progressing

## 2024-02-02 NOTE — Progress Notes (Signed)
 Assessment performed at 0700

## 2024-02-02 NOTE — Anesthesia Postprocedure Evaluation (Signed)
 Anesthesia Post Note  Patient: TENELLE ANDREASON  Procedure(s) Performed: ENDARTERECTOMY, CAROTID (Left) ANGIOPLASTY, USING PATCH GRAFT USING SIZE 1CM X 6CM (Left: Neck)     Patient location during evaluation: PACU Anesthesia Type: General Level of consciousness: awake and alert Pain management: pain level controlled Vital Signs Assessment: post-procedure vital signs reviewed and stable Respiratory status: spontaneous breathing, nonlabored ventilation, respiratory function stable and patient connected to nasal cannula oxygen Cardiovascular status: blood pressure returned to baseline and stable Postop Assessment: no apparent nausea or vomiting Anesthetic complications: no   No notable events documented.  Last Vitals:  Vitals:   02/02/24 1300 02/02/24 1400  BP: 119/79 (!) 96/54  Pulse: 69 70  Resp:    Temp:    SpO2:      Last Pain:  Vitals:   02/02/24 1254  TempSrc: Oral  PainSc: 0-No pain                 Theotis Flake P Glenda Kunst

## 2024-02-03 ENCOUNTER — Other Ambulatory Visit (HOSPITAL_COMMUNITY): Payer: Self-pay

## 2024-02-03 DIAGNOSIS — Z9889 Other specified postprocedural states: Secondary | ICD-10-CM

## 2024-02-03 LAB — CBC
HCT: 32.1 % — ABNORMAL LOW (ref 36.0–46.0)
Hemoglobin: 9.9 g/dL — ABNORMAL LOW (ref 12.0–15.0)
MCH: 25.5 pg — ABNORMAL LOW (ref 26.0–34.0)
MCHC: 30.8 g/dL (ref 30.0–36.0)
MCV: 82.7 fL (ref 80.0–100.0)
Platelets: 170 10*3/uL (ref 150–400)
RBC: 3.88 MIL/uL (ref 3.87–5.11)
RDW: 16.3 % — ABNORMAL HIGH (ref 11.5–15.5)
WBC: 9.3 10*3/uL (ref 4.0–10.5)
nRBC: 0 % (ref 0.0–0.2)

## 2024-02-03 LAB — GLUCOSE, CAPILLARY
Glucose-Capillary: 162 mg/dL — ABNORMAL HIGH (ref 70–99)
Glucose-Capillary: 171 mg/dL — ABNORMAL HIGH (ref 70–99)

## 2024-02-03 LAB — BASIC METABOLIC PANEL WITH GFR
Anion gap: 7 (ref 5–15)
BUN: 17 mg/dL (ref 8–23)
CO2: 27 mmol/L (ref 22–32)
Calcium: 8.6 mg/dL — ABNORMAL LOW (ref 8.9–10.3)
Chloride: 103 mmol/L (ref 98–111)
Creatinine, Ser: 1.29 mg/dL — ABNORMAL HIGH (ref 0.44–1.00)
GFR, Estimated: 45 mL/min — ABNORMAL LOW (ref >60)
Glucose, Bld: 162 mg/dL — ABNORMAL HIGH (ref 70–99)
Potassium: 4.2 mmol/L (ref 3.5–5.1)
Sodium: 137 mmol/L (ref 135–145)

## 2024-02-03 MED ORDER — HYDROCODONE-ACETAMINOPHEN 5-325 MG PO TABS
1.0000 | ORAL_TABLET | Freq: Four times a day (QID) | ORAL | 0 refills | Status: DC | PRN
  Filled 2024-02-03: qty 15, 4d supply, fill #0

## 2024-02-03 NOTE — Progress Notes (Signed)
 Patient verbalized understanding of dc instructions. Stroke education provided to patient. All belongings and paperwork provided to patient. VSS.neck surgical site unremarkable.

## 2024-02-03 NOTE — Progress Notes (Addendum)
  Progress Note    02/03/2024 8:38 AM 1 Day Post-Op  Subjective:  Wants to go home.  No neuro events overnight   Vitals:   02/03/24 0241 02/03/24 0814  BP: 136/65 (!) 96/52  Pulse: 69 65  Resp: 14 14  Temp: 98 F (36.7 C) 97.8 F (36.6 C)  SpO2: 100% 95%   Physical Exam: Lungs:  non labored Incisions:  L neck c/d/I; 20cc output in drain Extremities:  moving all ext well Neurologic: CN grossly intact  CBC    Component Value Date/Time   WBC 9.3 02/03/2024 0315   RBC 3.88 02/03/2024 0315   HGB 9.9 (L) 02/03/2024 0315   HGB 11.9 06/21/2023 1619   HCT 32.1 (L) 02/03/2024 0315   HCT 39.5 06/21/2023 1619   PLT 170 02/03/2024 0315   PLT 236 06/21/2023 1619   MCV 82.7 02/03/2024 0315   MCV 91 06/21/2023 1619   MCH 25.5 (L) 02/03/2024 0315   MCHC 30.8 02/03/2024 0315   RDW 16.3 (H) 02/03/2024 0315   RDW 14.5 06/21/2023 1619   LYMPHSABS 3.5 08/13/2018 1249   MONOABS 0.5 08/13/2018 1249   EOSABS 0.3 08/13/2018 1249   BASOSABS 0.0 08/13/2018 1249    BMET    Component Value Date/Time   NA 137 02/03/2024 0315   NA 141 06/21/2023 1619   K 4.2 02/03/2024 0315   CL 103 02/03/2024 0315   CO2 27 02/03/2024 0315   GLUCOSE 162 (H) 02/03/2024 0315   BUN 17 02/03/2024 0315   BUN 18 06/21/2023 1619   CREATININE 1.29 (H) 02/03/2024 0315   CREATININE 0.81 07/27/2016 1048   CALCIUM  8.6 (L) 02/03/2024 0315   GFRNONAA 45 (L) 02/03/2024 0315   GFRNONAA 79 07/27/2016 1048   GFRAA 45 (L) 01/23/2020 1147   GFRAA >89 07/27/2016 1048    INR    Component Value Date/Time   INR 1.0 01/24/2024 1134     Intake/Output Summary (Last 24 hours) at 02/03/2024 5784 Last data filed at 02/03/2024 0815 Gross per 24 hour  Intake 1798.33 ml  Output 1035 ml  Net 763.33 ml     Assessment/Plan:  68 y.o. female is s/p L CEA 1 Day Post-Op   NO neurological events overnight L neck incision is well appearing; 20cc output in JP overnight; ok to d/c drain Ambulate this morning Ok for  discharge home later today if ambulating without difficulty; office will arrange incision check in a few weeks   Cordie Deters, PA-C [facility] [phone] 02/03/2024 8:38 AM  VASCULAR STAFF ADDENDUM: I have independently interviewed and examined the patient. I agree with the above.  No sensorimotor deficits.  No pain. JP pulled Home today once ambulating  Kayla Part MD [facility] Phone Number: [phone] 02/03/2024 9:31 AM

## 2024-02-03 NOTE — Discharge Instructions (Signed)
   [facility]  Discharge Instructions   Carotid Endarterectomy (CEA)  Please refer to the following instructions for your post-procedure care. Your surgeon or physician assistant will discuss any changes with you.  Activity  You are encouraged to walk as much as you can. You can slowly return to normal activities but must avoid strenuous activity and heavy lifting until your doctor tell you it's OK. Avoid activities such as vacuuming or swinging a golf club. You can drive after one week if you are comfortable and you are no longer taking prescription pain medications. It is normal to feel tired for serval weeks after your surgery. It is also normal to have difficulty with sleep habits, eating, and bowel movements after surgery. These will go away with time.  Bathing/Showering  You may shower after you come home. Do not soak in a bathtub, hot tub, or swim until the incision heals completely.  Incision Care  Shower every day. Clean your incision with mild soap and water. Pat the area dry with a clean towel. You do not need a bandage unless otherwise instructed. Do not apply any ointments or creams to your incision. You may have skin glue on your incision. Do not peel it off. It will come off on its own in about one week. Your incision may feel thickened and raised for several weeks after your surgery. This is normal and the skin will soften over time. For Men Only: It's OK to shave around the incision but do not shave the incision itself for 2 weeks. It is common to have numbness under your chin that could last for several months.  Diet  Resume your normal diet. There are no special food restrictions following this procedure. A low fat/low cholesterol diet is recommended for all patients with vascular disease. In order to heal from your surgery, it is CRITICAL to get adequate nutrition. Your body requires vitamins, minerals, and protein. Vegetables are the best  source of vitamins and minerals. Vegetables also provide the perfect balance of protein. Processed food has little nutritional value, so try to avoid this.        Medications  Resume taking all of your medications unless your doctor or physician assistant tells you not to. If your incision is causing pain, you may take over-the- counter pain relievers such as acetaminophen (Tylenol). If you were prescribed a stronger pain medication, please be aware these medications can cause nausea and constipation. Prevent nausea by taking the medication with a snack or meal. Avoid constipation by drinking plenty of fluids and eating foods with a high amount of fiber, such as fruits, vegetables, and grains. Do not take Tylenol if you are taking prescription pain medications.  Follow Up  Our office will schedule a follow up appointment 2-3 weeks following discharge.  Please call us immediately for any of the following conditions  Increased pain, redness, drainage (pus) from your incision site. Fever of 101 degrees or higher. If you should develop stroke (slurred speech, difficulty swallowing, weakness on one side of your body, loss of vision) you should call 911 and go to the nearest emergency room.  Reduce your risk of vascular disease:  Stop smoking. If you would like help call [facility] at 1-800-QUIT-NOW ((910) 853-8047) or [place] at [phone]. Manage your cholesterol Maintain a desired weight Control your diabetes Keep your blood pressure down  If you have any questions, please call the office at [phone].

## 2024-02-03 NOTE — Progress Notes (Signed)
 Pt ambulated x 470 feet around the unit with front wheel walker, pt tolerated well. Pt left in chair

## 2024-02-03 NOTE — Progress Notes (Signed)
 Mobility Specialist Progress Note:    02/03/24 1120  Mobility  Activity Ambulated with assistance to bathroom;Ambulated with assistance in room  Level of Assistance Standby assist, set-up cues, supervision of patient - no hands on  Assistive Device None  Distance Ambulated (ft) 15 ft  Activity Response Tolerated well  Mobility Referral Yes  Mobility visit 1 Mobility  Mobility Specialist Start Time (ACUTE ONLY) 1120  Mobility Specialist Stop Time (ACUTE ONLY) 1124  Mobility Specialist Time Calculation (min) (ACUTE ONLY) 4 min   Pt requested assistance to bathroom. SV with no AD required. Tolerated well, asx throughout. Left pt in bathroom, RN notified. All needs met, eager for d/c.    Olsen Mccutchan Mobility Specialist Please contact via SecureChat or  Rehab office at [phone]

## 2024-02-04 NOTE — Discharge Summary (Signed)
 Discharge Summary     Pamela Thomas [birth year] 68 y.o. female  [id number]  Admission Date: 02/02/2024  Discharge Date: 02/03/24  Physician: Dr. Rosalva Comber  Admission Diagnosis: Stenosis of left carotid artery [I65.22] Status post carotid endarterectomy [Z98.890] Carotid artery stenosis [I65.29]  Discharge Day services:   See progress note 02/03/2024  Hospital Course:  Pamela Thomas is a 68 year old female who was brought in as an outpatient and underwent left carotid endarterectomy by Dr. Rosalva Comber on 02/02/2024.  This was performed due to asymptomatic high-grade stenosis of the left ICA.  She tolerated the procedure well and was admitted to the hospital postoperatively.  During her hospital course she did not experience any neurological events.  Left neck incision was well-appearing on postoperative day #1.  Drain was removed on postoperative day #1.  She will continue her aspirin , Plavix , statin daily.  She was prescribed 2 days of narcotic pain medication for continued postoperative pain control.  She will follow-up in the office in about 3 weeks.  She was discharged from the hospital in stable condition.   Recent Labs    02/03/24 0315  NA 137  K 4.2  CL 103  CO2 27  GLUCOSE 162*  BUN 17  CALCIUM  8.6*   Recent Labs    02/03/24 0315  WBC 9.3  HGB 9.9*  HCT 32.1*  PLT 170   No results for input(s): "INR" in the last 72 hours.     Discharge Diagnosis:  Stenosis of left carotid artery [I65.22] Status post carotid endarterectomy [Z98.890] Carotid artery stenosis [I65.29]  Secondary Diagnosis: Patient Active Problem List   Diagnosis Date Noted   Status post carotid endarterectomy 02/02/2024   Carotid artery stenosis 02/02/2024   Diverticular disease of colon 01/09/2024   Actinic keratoses 07/27/2023   Acquired trigger finger of left middle finger 07/24/2023   Left wrist pain 07/24/2023   Right wrist pain 07/24/2023   Neck muscle spasm 04/17/2023    Carpal tunnel syndrome of left wrist 09/14/2022   De Quervain's disease (tenosynovitis) 06/09/2022   Neoplasm of uncertain behavior 05/05/2022   Mixed stress and urge urinary incontinence 05/05/2022   Elevated blood uric acid level 04/28/2021   CKD (chronic kidney disease), stage II 02/03/2021   Nonrheumatic aortic valve stenosis 07/25/2019   Dysthymia 01/30/2019   S/P CABG x 1 10/15/2018   Carotid atherosclerosis, bilateral 10/15/2018   Coronary artery disease 08/17/2018   Insomnia 12/26/2013   Right knee DJD, s/p TKR 06/21/2012   Family history of malignant melanoma 06/15/2011   Type 2 diabetes mellitus with hyperglycemia, without long-term current use of insulin  (HCC) 12/09/2009   Type 2 diabetes mellitus without complications (HCC) 12/09/2009   CARDIAC MURMUR 05/27/2009   Hyperlipidemia associated with type 2 diabetes mellitus (HCC) 05/21/2007   ALLERGIC RHINITIS, SEASONAL 01/08/2007   Essential hypertension 11/09/2006   Past Medical History:  Diagnosis Date   ALLERGIC RHINITIS, SEASONAL    Anxiety    Back pain    BCC (basal cell carcinoma of skin) 2005   Nose [facility])   Carotid artery occlusion    Complication of anesthesia    Post operative delirium after CABG   Constipation    Coronary artery disease 2019   DES x2 placed - 2019 CABG x2 - 2019   Depression    Diabetes mellitus without complication (HCC)    DYSLIPIDEMIA    Dysrhythmia    palpitations-evaluated by Dr Stann Earnest 6/12/eccho 6/13   Glucose intolerance (impaired glucose tolerance)  Heart murmur    History of blood transfusion    Hypertension    Joint pain    Myocardial infarction (HCC) 2019   OBESITY, NOS    Osteoarthritis    rt knee   Peripheral vascular disease (HCC)    Carotid Stenosis - left   PLANTAR FASCIITIS, BILATERAL    Sleep apnea    Vertigo    Vitamin D  deficiency     Allergies as of 02/03/2024       Reactions   Icosapent  Ethyl (epa Ethyl Ester) (fish) Diarrhea    Statins    Myalgia pain moderate, flu like symptoms.    Morphine     Other Reaction(s): unable to wake up   Morphine  Sulfate    Severe hallucinations, difficult arousal.    Oxycodone  Nausea Only   Has to take nausea medications if taken. Prefer not to take        Medication List     TAKE these medications    acetaminophen  500 MG tablet Commonly known as: TYLENOL  Take 500 mg by mouth every 6 (six) hours as needed for moderate pain.   allopurinol  100 MG tablet Commonly known as: ZYLOPRIM  Take 1 tablet by mouth daily What changed: additional instructions   amoxicillin -clavulanate 875-125 MG tablet Commonly known as: AUGMENTIN  Take 1 tablet by mouth every 12 (twelve) hours.   Arnuity Ellipta 100 MCG/ACT Aepb Generic drug: Fluticasone  Furoate Inhale one puff into lings twice daily   Aspirin  Low Dose 81 MG tablet Generic drug: aspirin  EC TAKE 1 TABLET BY MOUTH EVERY DAY   azithromycin  250 MG tablet Commonly known as: ZITHROMAX  Take 2 tabs day 1, then 1 tab daily   benzonatate  200 MG capsule Commonly known as: TESSALON  Take 1 capsule (200 mg total) by mouth 3 (three) times daily as needed for cough.   candesartan  16 MG tablet Commonly known as: ATACAND  TAKE 1 TABLET BY MOUTH EVERY DAY   clopidogrel  75 MG tablet Commonly known as: PLAVIX  Take 1 tablet (75 mg total) by mouth daily.   colchicine  0.6 MG tablet TAKE 1 TABLET BY MOUTH EVERY DAY   ezetimibe  10 MG tablet Commonly known as: ZETIA  TAKE 1 TABLET BY MOUTH EVERY DAY   Fluoxetine  HCl (PMDD) 20 MG Tabs Take 1 tablet (20 mg total) by mouth daily.   HYDROcodone -acetaminophen  5-325 MG tablet Commonly known as: NORCO/VICODIN Take 1 tablet by mouth every 6 (six) hours as needed for moderate pain (pain score 4-6).   metFORMIN  1000 MG tablet Commonly known as: GLUCOPHAGE  TAKE 1 TABLET BY MOUTH TWICE A DAY WITH FOOD   metoprolol  succinate 25 MG 24 hr tablet Commonly known as: TOPROL -XL TAKE 1 TABLET BY  MOUTH IN THE MORNING AND AT BEDTIME   nitroGLYCERIN  0.4 MG SL tablet Commonly known as: NITROSTAT  Place 1 tablet (0.4 mg total) under the tongue every 5 (five) minutes as needed for chest pain.   pantoprazole  40 MG tablet Commonly known as: PROTONIX  Take 1 tablet (40 mg total) by mouth daily.   predniSONE  50 MG tablet Commonly known as: DELTASONE  Take 1 tablet today by mouth for 8 days   rosuvastatin  40 MG tablet Commonly known as: CRESTOR  TAKE 1 TABLET BY MOUTH EVERY DAY   trospium  20 MG tablet Commonly known as: SANCTURA  Take 1 tablet by mouth twice daily         Discharge Instructions:   [facility] Discharge Instructions Carotid Endarterectomy (CEA)  Please refer to the following instructions for  your post-procedure care. Your surgeon or physician assistant will discuss any changes with you.  Activity  You are encouraged to walk as much as you can. You can slowly return to normal activities but must avoid strenuous activity and heavy lifting until your doctor tell you it's OK. Avoid activities such as vacuuming or swinging a golf club. You can drive after one week if you are comfortable and you are no longer taking prescription pain medications. It is normal to feel tired for serval weeks after your surgery. It is also normal to have difficulty with sleep habits, eating, and bowel movements after surgery. These will go away with time.  Bathing/Showering  You may shower after you come home. Do not soak in a bathtub, hot tub, or swim until the incision heals completely.  Incision Care  Shower every day. Clean your incision with mild soap and water. Pat the area dry with a clean towel. You do not need a bandage unless otherwise instructed. Do not apply any ointments or creams to your incision. You may have skin glue on your incision. Do not peel it off. It will come off on its own in about one week. Your incision may feel thickened and  raised for several weeks after your surgery. This is normal and the skin will soften over time. For Men Only: It's OK to shave around the incision but do not shave the incision itself for 2 weeks. It is common to have numbness under your chin that could last for several months.  Diet  Resume your normal diet. There are no special food restrictions following this procedure. A low fat/low cholesterol diet is recommended for all patients with vascular disease. In order to heal from your surgery, it is CRITICAL to get adequate nutrition. Your body requires vitamins, minerals, and protein. Vegetables are the best source of vitamins and minerals. Vegetables also provide the perfect balance of protein. Processed food has little nutritional value, so try to avoid this.  Medications  Resume taking all of your medications unless your doctor or physician assistant tells you not to.  If your incision is causing pain, you may take over-the- counter pain relievers such as acetaminophen  (Tylenol ). If you were prescribed a stronger pain medication, please be aware these medications can cause nausea and constipation.  Prevent nausea by taking the medication with a snack or meal. Avoid constipation by drinking plenty of fluids and eating foods with a high amount of fiber, such as fruits, vegetables, and grains. Do not take Tylenol  if you are taking prescription pain medications.  Follow Up  Our office will schedule a follow up appointment 2-3 weeks following discharge.  Please call us  immediately for any of the following conditions  Increased pain, redness, drainage (pus) from your incision site. Fever of 101 degrees or higher. If you should develop stroke (slurred speech, difficulty swallowing, weakness on one side of your body, loss of vision) you should call 911 and go to the nearest emergency room.  Reduce your risk of vascular disease:  Stop smoking. If you would like help call [facility] at 1-800-QUIT-NOW  (787-140-1179) or [place] at [phone]. Manage your cholesterol Maintain a desired weight Control your diabetes Keep your blood pressure down  If you have any questions, please call the office at [phone].  Disposition: home  Patient's condition: is Good  Follow up: 1. [facility] in 3 weeks.   Cordie Deters, PA-C [facility] [phone]   --- For [facility] use ---  Modified Rankin score at D/C (0-6): 0  IV medication needed for:  1. Hypertension: No 2. Hypotension: No  Post-op Complications: No  1. Post-op CVA or TIA: No  If yes: Event classification (right eye, left eye, right cortical, left cortical, verterobasilar, other):   If yes: Timing of event (intra-op, <6 hrs post-op, >=6 hrs post-op, unknown):   2. CN injury: No  If yes: CN  injuried   3. Myocardial infarction: No  If yes: Dx by (EKG or clinical, Troponin):   4.  CHF: No  5.  Dysrhythmia (new): No  6. Wound infection: No  7. Reperfusion symptoms: No  8. Return to OR: No  If yes: return to OR for (bleeding, neurologic, other CEA incision, other):   Discharge medications: Statin use:  Yes ASA use:  Yes   Beta blocker use:  Yes ACE-Inhibitor use:  No  ARB use:  Yes CCB use: No P2Y12 Antagonist use: Yes, [ x] Plavix , [ ]  Plasugrel, [ ]  Ticlopinine, [ ]  Ticagrelor, [ ]  Other, [ ]  No for medical reason, [ ]  Non-compliant, [ ]  Not-indicated Anti-coagulant use:  No, [ ]  Warfarin, [ ]  Rivaroxaban , [ ]  Dabigatran,

## 2024-02-06 ENCOUNTER — Encounter (HOSPITAL_COMMUNITY): Payer: Self-pay | Admitting: Vascular Surgery

## 2024-02-06 ENCOUNTER — Telehealth: Payer: Self-pay

## 2024-02-06 NOTE — Telephone Encounter (Signed)
 Pt called to let us  know she is having some light red blood for incision since the drain was removed over the weekend. The area looks good otherwise, no redness/pus/swelling. She changes the dressing 1-2x/daily. Advised her to keep an eye on it and to call us  back if this persists or if she has any discharge, pus, redness, swelling,etc. She verbalized understanding of the above.

## 2024-02-06 NOTE — Transitions of Care (Post Inpatient/ED Visit) (Signed)
   02/06/2024  Name: [name] MRN: [id number] DOB: [birth year]  Today's TOC FU Call Status: Today's TOC FU Call Status:: Unsuccessful Call (1st Attempt) Unsuccessful Call (1st Attempt) Date: 02/06/24  Attempted to reach the patient regarding the most recent Inpatient/ED visit. #1 outreach attempt there was not answer but RN CM left generic confidential voice mail on designated preferred contact number.   Follow Up Plan: Additional outreach attempts will be made to reach the patient to complete the Transitions of Care (Post Inpatient/ED visit) call.    Katheryn Pandy MSN, RN RN Case Sales executive Health  [facility] Office Hours M-F [phone] Direct Dial: [phone] Main Phone [phone]  Fax: [phone] [email or web address]

## 2024-02-07 ENCOUNTER — Telehealth: Payer: Self-pay

## 2024-02-07 NOTE — Transitions of Care (Post Inpatient/ED Visit) (Signed)
 02/07/2024  Name: Pamela Thomas MRN: [id number] DOB: 1955/12/20  Today's TOC FU Call Status: Today's TOC FU Call Status:: Successful TOC FU Call Completed TOC FU Call Complete Date: 02/07/24 Patient's Name and Date of Birth confirmed.  Transition Care Management Follow-up Telephone Call Date of Discharge: 02/03/24 Discharge Facility: [facility]) Type of Discharge: Inpatient Admission Primary Inpatient Discharge Diagnosis:: Elective Carotid Endarterectomy Any questions or concerns?: No  Items Reviewed: Did you receive and understand the discharge instructions provided?: Yes Medications obtained,verified, and reconciled?: Yes (Medications Reviewed) Any new allergies since your discharge?: No Dietary orders reviewed?: NA Do you have support at home?: Yes People in Home [RPT]: spouse Name of Support/Comfort Primary Source: Pamela Thomas, Pamela Thomas (Spouse)  [phone] (Mobile)  Medications Reviewed Today: Medications Reviewed Today     Reviewed by Areta Beer, RN (Case Manager) on 02/07/24 at 1443  Med List Status: <None>   Medication Order Taking? Sig Documenting Provider Last Dose Status Informant  acetaminophen  (TYLENOL ) 500 MG tablet [id number] Yes Take 500 mg by mouth every 6 (six) hours as needed for moderate pain.  [provider] Taking Active Self  allopurinol  (ZYLOPRIM ) 100 MG tablet [id number] Yes Take 1 tablet by mouth daily Charise Companion, MD Taking Active            Med Note Parke Boll, Tylene Galla Feb 07, 2024  2:40 PM) Per PCP directions.   amoxicillin -clavulanate (AUGMENTIN ) 875-125 MG tablet [id number] No Take 1 tablet by mouth every 12 (twelve) hours.  Patient not taking: Reported on 02/07/2024   Buena Carmine, NP Not Taking Active Self  aspirin  EC (ASPIRIN  LOW DOSE) 81 MG tablet [id number] Yes TAKE 1 TABLET BY MOUTH EVERY DAY Charise Companion, MD Taking Active Self  azithromycin  (ZITHROMAX ) 250 MG tablet [id number] No Take 2 tabs day 1, then 1 tab daily   Patient not taking: Reported on 02/07/2024   Charise Companion, MD Not Taking Active   benzonatate  (TESSALON ) 200 MG capsule [id number]  Take 1 capsule (200 mg total) by mouth 3 (three) times daily as needed for cough. Buena Carmine, NP  Active Self  candesartan  (ATACAND ) 16 MG tablet [id number]  TAKE 1 TABLET BY MOUTH EVERY DAY Eilleen Grates, MD  Active Self  clopidogrel  (PLAVIX ) 75 MG tablet [id number] Yes Take 1 tablet (75 mg total) by mouth daily. Kayla Part, MD Taking Active Self  colchicine  0.6 MG tablet [id number] No TAKE 1 TABLET BY MOUTH EVERY DAY  Patient not taking: Reported on 02/07/2024   Charise Companion, MD Not Taking Active Self  ezetimibe  (ZETIA ) 10 MG tablet [id number] Yes TAKE 1 TABLET BY MOUTH EVERY DAY Eilleen Grates, MD Taking Active Self  Fluoxetine  HCl, PMDD, 20 MG TABS [id number] Yes Take 1 tablet (20 mg total) by mouth daily. Charise Companion, MD Taking Active   Fluticasone  Furoate (ARNUITY ELLIPTA) 100 MCG/ACT AEPB [id number] No Inhale one puff into lings twice daily  Patient not taking: Reported on 02/07/2024   Charise Companion, MD Not Taking Active   HYDROcodone -acetaminophen  (NORCO/VICODIN) 5-325 MG tablet [id number] No Take 1 tablet by mouth every 6 (six) hours as needed for moderate pain (pain score 4-6).  Patient not taking: Reported on 02/07/2024   Cordie Deters, PA-C Not Taking Active   metFORMIN  (GLUCOPHAGE ) 1000 MG tablet [id number] Yes TAKE 1 TABLET BY MOUTH TWICE A DAY WITH FOOD Charise Companion, MD Taking Active Self  metoprolol  succinate (TOPROL -XL) 25 MG 24 hr tablet  [id number] Yes TAKE 1 TABLET BY MOUTH IN THE MORNING AND AT BEDTIME Charise Companion, MD Taking Active Self  nitroGLYCERIN  (NITROSTAT ) 0.4 MG SL tablet [id number]  Place 1 tablet (0.4 mg total) under the tongue every 5 (five) minutes as needed for chest pain. Eilleen Grates, MD  Expired 01/19/24 2359 Self           Med Note Parke Boll, Saydee Zolman B   Wed Feb 07, 2024  2:42 PM) Never had to use, Expired  pantoprazole   (PROTONIX ) 40 MG tablet [id number] Yes Take 1 tablet (40 mg total) by mouth daily. Charise Companion, MD Taking Active   predniSONE  (DELTASONE ) 50 MG tablet [id number] No Take 1 tablet today by mouth for 8 days  Patient not taking: Reported on 02/07/2024   Charise Companion, MD Not Taking Active   rosuvastatin  (CRESTOR ) 40 MG tablet [id number] Yes TAKE 1 TABLET BY MOUTH EVERY DAY Eilleen Grates, MD Taking Active   trospium  (SANCTURA ) 20 MG tablet [id number] Yes Take 1 tablet by mouth twice daily Charise Companion, MD Taking Active             Home Care and Equipment/Supplies: Were Home Health Services Ordered?: No Any new equipment or medical supplies ordered?: No  Functional Questionnaire: Do you need assistance with bathing/showering or dressing?: No Do you need assistance with meal preparation?: No Do you need assistance with eating?: No Do you have difficulty maintaining continence: No Do you need assistance with getting out of bed/getting out of a chair/moving?: No Do you have difficulty managing or taking your medications?: No  Follow up appointments reviewed: PCP Follow-up appointment confirmed?: Yes Date of PCP follow-up appointment?: 02/28/24 (Soon as provider could schedule, but has been on contact by phone) Follow-up Provider: Violetta Grice Specialist [facility] Follow-up appointment confirmed?: Yes Date of Specialist follow-up appointment?: 02/29/24 Follow-Up Specialty Provider:: Dr. Laraine Plate Do you need transportation to your follow-up appointment?: No Do you understand care options if your condition(s) worsen?: Yes-patient verbalized understanding  SDOH Interventions Today    Flowsheet Row Most Recent Value  SDOH Interventions   Food Insecurity Interventions Intervention Not Indicated  Housing Interventions Intervention Not Indicated  Transportation Interventions Intervention Not Indicated  Utilities Interventions Intervention Not Indicated  Social Connections Interventions  Intervention Not Indicated  Health Literacy Interventions Intervention Not Indicated      Patient declined 30 day program.   Katheryn Pandy MSN, RN RN Case Manager [place]  [facility] Office Hours M-F [phone] Direct Dial: [phone] Main Phone [phone]  Fax: [phone] [email or web address]

## 2024-02-07 NOTE — Transitions of Care (Post Inpatient/ED Visit) (Deleted)
 Transition of Care Initial Call   Visit Note  02/07/2024  Name: Pamela Thomas MRN: [id number]          DOB: [birth year]  Situation: Patient declined to enroll in [facility] 30-day program. Visit completed with patient by telephone.   Background:   Initial Transition Care Management Follow-up Telephone Call Date of Discharge: 02/03/24 Discharge Facility: [facility]) Type of Discharge: Inpatient Admission Primary Inpatient Discharge Diagnosis:: Elective Carotid Endarterectomy Any questions or concerns?: No  Past Medical History:  Diagnosis Date   ALLERGIC RHINITIS, SEASONAL    Anxiety    Back pain    BCC (basal cell carcinoma of skin) 2005   Nose [facility])   Carotid artery occlusion    Complication of anesthesia    Post operative delirium after CABG   Constipation    Coronary artery disease 2019   DES x2 placed - 2019 CABG x2 - 2019   Depression    Diabetes mellitus without complication (HCC)    DYSLIPIDEMIA    Dysrhythmia    palpitations-evaluated by Dr Stann Earnest 6/12/eccho 6/13   Glucose intolerance (impaired glucose tolerance)    Heart murmur    History of blood transfusion    Hypertension    Joint pain    Myocardial infarction (HCC) 2019   OBESITY, NOS    Osteoarthritis    rt knee   Peripheral vascular disease (HCC)    Carotid Stenosis - left   PLANTAR FASCIITIS, BILATERAL    Sleep apnea    Vertigo    Vitamin D  deficiency     Assessment: Patient Reported Symptoms: Cognitive        Neurological      HEENT        Cardiovascular      Respiratory      Endocrine      Gastrointestinal        Genitourinary      Integumentary      Musculoskeletal          Psychosocial           There were no vitals filed for this visit.  Medications Reviewed Today     Reviewed by Areta Beer, RN (Case Manager) on 02/07/24 at 1443  Med List Status: <None>   Medication Order Taking? Sig Documenting Provider Last Dose Status Informant   acetaminophen  (TYLENOL ) 500 MG tablet [id number] Yes Take 500 mg by mouth every 6 (six) hours as needed for moderate pain.  [provider] Taking Active Self  allopurinol  (ZYLOPRIM ) 100 MG tablet [id number] Yes Take 1 tablet by mouth daily Charise Companion, MD Taking Active            Med Note Parke Boll, Tylene Galla Feb 07, 2024  2:40 PM) Per PCP directions.   amoxicillin -clavulanate (AUGMENTIN ) 875-125 MG tablet [id number] No Take 1 tablet by mouth every 12 (twelve) hours.  Patient not taking: Reported on 02/07/2024   Buena Carmine, NP Not Taking Active Self  aspirin  EC (ASPIRIN  LOW DOSE) 81 MG tablet [id number] Yes TAKE 1 TABLET BY MOUTH EVERY DAY Charise Companion, MD Taking Active Self  azithromycin  (ZITHROMAX ) 250 MG tablet [id number] No Take 2 tabs day 1, then 1 tab daily  Patient not taking: Reported on 02/07/2024   Charise Companion, MD Not Taking Active   benzonatate  (TESSALON ) 200 MG capsule [id number]  Take 1 capsule (200 mg total) by mouth 3 (three) times daily as needed for cough. Cydney Draft  S, NP  Active Self  candesartan  (ATACAND ) 16 MG tablet [id number]  TAKE 1 TABLET BY MOUTH EVERY DAY Eilleen Grates, MD  Active Self  clopidogrel  (PLAVIX ) 75 MG tablet [id number] Yes Take 1 tablet (75 mg total) by mouth daily. Kayla Part, MD Taking Active Self  colchicine  0.6 MG tablet [id number] No TAKE 1 TABLET BY MOUTH EVERY DAY  Patient not taking: Reported on 02/07/2024   Charise Companion, MD Not Taking Active Self  ezetimibe  (ZETIA ) 10 MG tablet [id number] Yes TAKE 1 TABLET BY MOUTH EVERY DAY Eilleen Grates, MD Taking Active Self  Fluoxetine  HCl, PMDD, 20 MG TABS [id number] Yes Take 1 tablet (20 mg total) by mouth daily. Charise Companion, MD Taking Active   Fluticasone  Furoate (ARNUITY ELLIPTA) 100 MCG/ACT AEPB [id number] No Inhale one puff into lings twice daily  Patient not taking: Reported on 02/07/2024   Charise Companion, MD Not Taking Active   HYDROcodone -acetaminophen  (NORCO/VICODIN) 5-325 MG  tablet [id number] No Take 1 tablet by mouth every 6 (six) hours as needed for moderate pain (pain score 4-6).  Patient not taking: Reported on 02/07/2024   Cordie Deters, PA-C Not Taking Active   metFORMIN  (GLUCOPHAGE ) 1000 MG tablet [id number] Yes TAKE 1 TABLET BY MOUTH TWICE A DAY WITH FOOD Charise Companion, MD Taking Active Self  metoprolol  succinate (TOPROL -XL) 25 MG 24 hr tablet [id number] Yes TAKE 1 TABLET BY MOUTH IN THE MORNING AND AT BEDTIME Charise Companion, MD Taking Active Self  nitroGLYCERIN  (NITROSTAT ) 0.4 MG SL tablet [id number]  Place 1 tablet (0.4 mg total) under the tongue every 5 (five) minutes as needed for chest pain. Eilleen Grates, MD  Expired 01/19/24 2359 Self           Med Note Parke Boll, Othello Dickenson B   Wed Feb 07, 2024  2:42 PM) Never had to use, Expired  pantoprazole  (PROTONIX ) 40 MG tablet [id number] Yes Take 1 tablet (40 mg total) by mouth daily. Charise Companion, MD Taking Active   predniSONE  (DELTASONE ) 50 MG tablet [id number] No Take 1 tablet today by mouth for 8 days  Patient not taking: Reported on 02/07/2024   Charise Companion, MD Not Taking Active   rosuvastatin  (CRESTOR ) 40 MG tablet [id number] Yes TAKE 1 TABLET BY MOUTH EVERY Garlan Juniper, MD Taking Active   trospium  (SANCTURA ) 20 MG tablet [id number] Yes Take 1 tablet by mouth twice daily Charise Companion, MD Taking Active             Recommendation:   {RECOMMENDATONS:32554}  Follow Up Plan:   {FOLLOWUP:32559}  SIG ***

## 2024-02-09 ENCOUNTER — Telehealth: Payer: Self-pay | Admitting: Cardiology

## 2024-02-09 ENCOUNTER — Ambulatory Visit: Admitting: Cardiology

## 2024-02-09 ENCOUNTER — Encounter: Payer: Self-pay | Admitting: Family Medicine

## 2024-02-09 MED ORDER — EZETIMIBE 10 MG PO TABS: 10.0000 mg | ORAL_TABLET | Freq: Every day | ORAL | 3 refills | Status: AC

## 2024-02-09 NOTE — Telephone Encounter (Signed)
*  STAT* If patient is at the pharmacy, call can be transferred to refill team.   1. Which medications need to be refilled? (please list name of each medication and dose if known)   ezetimibe  (ZETIA ) 10 MG tablet    2. Which pharmacy/location (including street and city if local pharmacy) is medication to be sent to?  [facility] - [place], [place] - [address]      3. Do they need a 30 day or 90 day supply? 90 day    Pt is out of medication

## 2024-02-09 NOTE — Telephone Encounter (Signed)
 Spoke with patient and she states she was returning a call to [place] from [place] office.  She states she left her a voicemail but no extension and she would like to speak with her.  I do not see an encounter

## 2024-02-09 NOTE — Telephone Encounter (Signed)
 Pt's medication was sent to pt's pharmacy as requested. Confirmation received.

## 2024-02-09 NOTE — Telephone Encounter (Signed)
 Returned call to pt.

## 2024-02-09 NOTE — Telephone Encounter (Signed)
 Pt states she's returning a call to the nurse. Please advise

## 2024-02-12 ENCOUNTER — Other Ambulatory Visit: Payer: Self-pay | Admitting: Family Medicine

## 2024-02-13 DIAGNOSIS — C44319 Basal cell carcinoma of skin of other parts of face: Secondary | ICD-10-CM | POA: Diagnosis not present

## 2024-02-13 DIAGNOSIS — C44619 Basal cell carcinoma of skin of left upper limb, including shoulder: Secondary | ICD-10-CM | POA: Diagnosis not present

## 2024-02-13 DIAGNOSIS — L821 Other seborrheic keratosis: Secondary | ICD-10-CM | POA: Diagnosis not present

## 2024-02-13 DIAGNOSIS — L814 Other melanin hyperpigmentation: Secondary | ICD-10-CM | POA: Diagnosis not present

## 2024-02-13 DIAGNOSIS — L570 Actinic keratosis: Secondary | ICD-10-CM | POA: Diagnosis not present

## 2024-02-13 DIAGNOSIS — C44329 Squamous cell carcinoma of skin of other parts of face: Secondary | ICD-10-CM | POA: Diagnosis not present

## 2024-02-13 DIAGNOSIS — Z85828 Personal history of other malignant neoplasm of skin: Secondary | ICD-10-CM | POA: Diagnosis not present

## 2024-02-28 ENCOUNTER — Ambulatory Visit: Admitting: Family Medicine

## 2024-02-28 ENCOUNTER — Encounter: Payer: Self-pay | Admitting: Family Medicine

## 2024-02-28 VITALS — BP 172/71 | HR 74 | Ht 62.0 in | Wt 229.2 lb

## 2024-02-28 DIAGNOSIS — Z9889 Other specified postprocedural states: Secondary | ICD-10-CM | POA: Diagnosis not present

## 2024-02-28 DIAGNOSIS — I10 Essential (primary) hypertension: Secondary | ICD-10-CM | POA: Diagnosis not present

## 2024-02-28 DIAGNOSIS — G4733 Obstructive sleep apnea (adult) (pediatric): Secondary | ICD-10-CM

## 2024-02-28 DIAGNOSIS — Z0000 Encounter for general adult medical examination without abnormal findings: Secondary | ICD-10-CM

## 2024-02-28 DIAGNOSIS — E1165 Type 2 diabetes mellitus with hyperglycemia: Secondary | ICD-10-CM | POA: Diagnosis not present

## 2024-02-28 MED ORDER — MOUNJARO 2.5 MG/0.5ML ~~LOC~~ SOAJ: 2.5000 mg | SUBCUTANEOUS | 0 refills | Status: DC

## 2024-02-28 MED ORDER — COLCHICINE 0.6 MG PO TABS: ORAL_TABLET | ORAL | Status: DC

## 2024-02-28 MED ORDER — CANDESARTAN CILEXETIL 32 MG PO TABS: 32.0000 mg | ORAL_TABLET | Freq: Every day | ORAL | 3 refills | Status: DC

## 2024-02-28 MED ORDER — TIRZEPATIDE 2.5 MG/0.5ML ~~LOC~~ SOAJ: 2.5000 mg | SUBCUTANEOUS | 2 refills | Status: DC

## 2024-02-28 NOTE — Assessment & Plan Note (Signed)
 Updated on most health maintenance. Discussed returning to more activity, decreasing portion sizes.

## 2024-02-28 NOTE — Assessment & Plan Note (Signed)
 Will add Tirzepatide. To give her better chance of tolerating this without as much GI upset as she had on semaglutide , will decrease metformin  to 1000 mg daily for a while. If she is still experiencing GI upset after doing this and after start Tirzepatide, we can consider dc metformin  for a month or so. Goal would be to titrate it back in if possible,. Samples given for one box. Rx sent.reviewed  instructions for administration.

## 2024-02-28 NOTE — Assessment & Plan Note (Signed)
 Now out of her previously excellent control; in fact she had been having some low BP readings for a while and we decreased her meds. She suspects weight gain has had impact and we are starting Tirzepatide which may help with that. For now will double her candesartan  up to 32 mg a day. I want her to f/u with some readings and see me in office 4-6 weeks. MyChart for interim issues.

## 2024-02-28 NOTE — Assessment & Plan Note (Signed)
 Continue CPAP.

## 2024-02-28 NOTE — Progress Notes (Signed)
    CHIEF COMPLAINT / HPI:  Well visit Just had left carotid surgery Feels pretty well and glad it is over CPAP is working great! Making a big difference!No longer having faigue, feels clearer mentally, more like herself. Has gained some weight--thinks it was primarily the worry over her upcoming carotid endarterectomy. Said she worried more about that than she did when she had open heart surgery. Wants to get her weight back under control   PERTINENT  PMH / PSH: I have reviewed the patient's medications, allergies, past medical and surgical history, smoking status and updated in the EMR as appropriate.   OBJECTIVE:  BP (!) 172/71   Pulse 74   Ht 5' 2 (1.575 m)   Wt 229 lb 3.2 oz (104 kg)   LMP 10/10/2010   SpO2 96%   BMI 41.92 kg/m  Vital signs reviewed. GENERAL: Well-developed, well-nourished, no acute distress. CARDIOVASCULAR: Regular rate and rhythm  LUNGS: normal work of breathing NECK left healing endarterectomy scar still has some scab but no sign of infection.no dehiscence MSK: Movement of extremity x 4. PSYCH: AxOx4. Good eye contact.. No psychomotor retardation or agitation. Appropriate speech fluency and content. Asks and answers questions appropriately. Mood is congruent.    ASSESSMENT / PLAN:   Well adult health check Updated on most health maintenance. Discussed returning to more activity, decreasing portion sizes.  OSA on CPAP Continue CPAP  Type 2 diabetes mellitus with hyperglycemia, without long-term current use of insulin  (HCC) Will add Tirzepatide. To give her better chance of tolerating this without as much GI upset as she had on semaglutide , will decrease metformin  to 1000 mg daily for a while. If she is still experiencing GI upset after doing this and after start Tirzepatide, we can consider dc metformin  for a month or so. Goal would be to titrate it back in if possible,. Samples given for one box. Rx sent.reviewed  instructions for  administration.  Essential hypertension Now out of her previously excellent control; in fact she had been having some low BP readings for a while and we decreased her meds. She suspects weight gain has had impact and we are starting Tirzepatide which may help with that. For now will double her candesartan  up to 32 mg a day. I want her to f/u with some readings and see me in office 4-6 weeks. MyChart for interim issues.    Violetta Grice MD

## 2024-02-29 ENCOUNTER — Ambulatory Visit: Attending: Vascular Surgery | Admitting: Physician Assistant

## 2024-02-29 VITALS — BP 119/70 | HR 66 | Temp 98.5°F | Ht 62.0 in | Wt 229.3 lb

## 2024-02-29 DIAGNOSIS — I6522 Occlusion and stenosis of left carotid artery: Secondary | ICD-10-CM

## 2024-02-29 NOTE — Progress Notes (Signed)
 POST OPERATIVE OFFICE NOTE    CC:  F/u for surgery  HPI:  Pamela Thomas is a 68 y.o. female who is here for a postop visit.  She recently underwent left carotid endarterectomy with bovine pericardial patch angioplasty on 02/02/2024 by Dr. Rosalva Comber.  This was done for high-grade asymptomatic left ICA stenosis.  She returns today for follow-up.  She says that she is doing well.  She denies any issues with pain, drainage, redness, or tenderness at her incision site.  She denies any strokelike symptoms such as slurred speech, facial droop, sudden weakness/numbness, or sudden visual changes.  She has been taking a daily aspirin , Plavix , and statin.  She is wondering when she can stop her Plavix .   Allergies  Allergen Reactions   Icosapent  Ethyl (Epa Ethyl Ester) (Fish) Diarrhea   Statins     Myalgia pain moderate, flu like symptoms.    Morphine      Other Reaction(s): unable to wake up   Morphine  Sulfate     Severe hallucinations, difficult arousal.    Oxycodone  Nausea Only    Has to take nausea medications if taken. Prefer not to take    Current Outpatient Medications  Medication Sig Dispense Refill   allopurinol  (ZYLOPRIM ) 100 MG tablet Take 1 tablet by mouth on Monday, Wednesday, Friday, Sunday & Take 1/2 tablet by mouth on Tuesday, Thursday, Saturday 22 tablet 11   aspirin  EC (ASPIRIN  LOW DOSE) 81 MG tablet TAKE 1 TABLET BY MOUTH EVERY DAY 100 tablet 3   candesartan  (ATACAND ) 32 MG tablet Take 1 tablet (32 mg total) by mouth at bedtime. 90 tablet 3   clopidogrel  (PLAVIX ) 75 MG tablet Take 1 tablet (75 mg total) by mouth daily. 30 tablet 6   colchicine  0.6 MG tablet Take for gout flairs one or two by mouth daily as needed     ezetimibe  (ZETIA ) 10 MG tablet Take 1 tablet (10 mg total) by mouth daily. 90 tablet 3   Fluoxetine  HCl, PMDD, 20 MG TABS Take 1 tablet (20 mg total) by mouth daily. 90 tablet 3   metFORMIN  (GLUCOPHAGE ) 1000 MG tablet TAKE 1 TABLET BY MOUTH TWICE A DAY WITH FOOD  180 tablet 3   metoprolol  succinate (TOPROL -XL) 25 MG 24 hr tablet TAKE 1 TABLET BY MOUTH IN THE MORNING AND AT BEDTIME 180 tablet 3   pantoprazole  (PROTONIX ) 40 MG tablet Take 1 tablet by mouth daily 30 tablet 11   rosuvastatin  (CRESTOR ) 40 MG tablet TAKE 1 TABLET BY MOUTH EVERY DAY 90 tablet 3   tirzepatide (MOUNJARO) 2.5 MG/0.5ML Pen Inject 2.5 mg into the skin once a week. 2 mL 2   tirzepatide (MOUNJARO) 2.5 MG/0.5ML Pen Inject 2.5 mg into the skin once a week. 2 mL 0   trospium  (SANCTURA ) 20 MG tablet Take 1 tablet by mouth twice daily 60 tablet 11   nitroGLYCERIN  (NITROSTAT ) 0.4 MG SL tablet Place 1 tablet (0.4 mg total) under the tongue every 5 (five) minutes as needed for chest pain. (Patient not taking: Reported on 02/29/2024) 25 tablet 3   No current facility-administered medications for this visit.     ROS:  See HPI  Physical Exam:  Incision: Left-sided neck incision well-healed without signs of infection or hematoma Extremities: Palpable and equal radial pulses bilaterally Neuro: Moving all extremities equally, no weakness or numbness, no slurred speech, no facial droop    Assessment/Plan:  This is a 67 y.o. female who is here for postop visit  - The patient  recently underwent left carotid endarterectomy for asymptomatic high-grade critical ICA stenosis.  Her left sided carotid incision is well-healed without signs of infection or hematoma -She has palpable and equal radial pulses bilaterally.  She is neurologically intact on exam - She denies any strokelike symptoms such as slurred speech, facial droop, sudden visual changes, or sudden weakness/numbness - She has been taking her daily aspirin , Plavix , and statin.  Given that she has recovered well from her surgery without strokelike symptoms, she can discontinue her Plavix  after 30 days postop she will continue her aspirin  and statin indefinitely -She can follow-up with our office in 3 months with repeat carotid  duplex   Pamela Finlay, PA-C [facility] [phone]   Clinic MD:  Rosalva Comber

## 2024-03-01 ENCOUNTER — Other Ambulatory Visit: Payer: Self-pay | Admitting: *Deleted

## 2024-03-01 DIAGNOSIS — I6522 Occlusion and stenosis of left carotid artery: Secondary | ICD-10-CM

## 2024-03-20 ENCOUNTER — Other Ambulatory Visit: Payer: Self-pay | Admitting: Family Medicine

## 2024-04-09 ENCOUNTER — Encounter: Payer: Self-pay | Admitting: Family Medicine

## 2024-04-12 ENCOUNTER — Other Ambulatory Visit: Payer: Self-pay | Admitting: Family Medicine

## 2024-04-12 MED ORDER — TIRZEPATIDE 5 MG/0.5ML ~~LOC~~ SOAJ: 5.0000 mg | SUBCUTANEOUS | 3 refills | Status: DC

## 2024-04-26 ENCOUNTER — Other Ambulatory Visit: Payer: Self-pay

## 2024-04-26 DIAGNOSIS — I6522 Occlusion and stenosis of left carotid artery: Secondary | ICD-10-CM

## 2024-05-01 ENCOUNTER — Encounter: Payer: Self-pay | Admitting: Family Medicine

## 2024-05-01 ENCOUNTER — Ambulatory Visit (INDEPENDENT_AMBULATORY_CARE_PROVIDER_SITE_OTHER): Admitting: Family Medicine

## 2024-05-01 VITALS — BP 143/68 | HR 74 | Ht 62.0 in | Wt 216.6 lb

## 2024-05-01 DIAGNOSIS — E1165 Type 2 diabetes mellitus with hyperglycemia: Secondary | ICD-10-CM | POA: Diagnosis not present

## 2024-05-01 DIAGNOSIS — G4733 Obstructive sleep apnea (adult) (pediatric): Secondary | ICD-10-CM

## 2024-05-01 DIAGNOSIS — M65332 Trigger finger, left middle finger: Secondary | ICD-10-CM

## 2024-05-01 DIAGNOSIS — Z6839 Body mass index (BMI) 39.0-39.9, adult: Secondary | ICD-10-CM | POA: Insufficient documentation

## 2024-05-01 DIAGNOSIS — I10 Essential (primary) hypertension: Secondary | ICD-10-CM | POA: Diagnosis not present

## 2024-05-01 MED ORDER — TIRZEPATIDE 5 MG/0.5ML ~~LOC~~ SOAJ: 5.0000 mg | SUBCUTANEOUS | 3 refills | Status: DC

## 2024-05-01 NOTE — Assessment & Plan Note (Addendum)
 Using nightly but has problems with dry mouth.  Discussed synthetic saliva.

## 2024-05-01 NOTE — Progress Notes (Signed)
   Discussed the use of AI scribe software for clinical note transcription with the patient, who gave verbal consent to proceed.  History of Present Illness   Pamela Thomas is a 68 year old female with hypertension who presents for a follow-up visit.  Hypertension and antihypertensive medication management - Takes candesartan  32 mg and metoprolol , provided in pre-packaged doses by insurance - Lost one metoprolol  tablet after dropping her pills  Weight loss and tirzepatide  use - Started tirzepatide  recently - Weight decreased from 229 lbs on June 18 to 216 lbs today - Experiences increased eructation - No nausea - Missed one dose this week due to injection pen malfunction  Left middle finger trigger finger - Trigger finger in left middle finger for five years - Significant pain, especially when pulling sheets - Managed with massage, but symptoms have worsened recently  Xerostomia and cpap use - Uses CPAP machine nightly - Persistent xerostomia, attributed to upper dental plate - Fills CPAP water bin nightly, but dry mouth persists - Afrin nasal spray tried, but may not be effective for moisturizing        PERTINENT  PMH / PSH: I have reviewed the patient's medications, allergies, past medical and surgical history, smoking status.  Pertinent findings that relate to today's visit / issues include: OSA   Physical Exam   MEASUREMENTS: Weight- 216.     Vital signs reviewed. GENERAL: Well-developed, well-nourished, no acute distress. CARDIOVASCULAR: Regular rate and rhythm n NEURO: No gross focal neurological deficits. MSK: Movement of extremity x 4. PSYCH: AxOx4. Good eye contact.. No psychomotor retardation or agitation. Appropriate speech fluency and content. Asks and answers questions appropriately. Mood is congruent.   Results           Assessment and Plan    Assessment & Plan Essential hypertension Much better controlled with candesartan  and  metoprolol  and will continue both at current dose.  Follow-up 3 months no side effects specifically no dizziness Type 2 diabetes mellitus with hyperglycemia, without long-term current use of insulin  (HCC) Having much better control with the addition of tirzepatide  and we will continue that and follow-up 3 months OSA on CPAP Using nightly but has problems with dry mouth.  Discussed synthetic saliva. Acquired trigger finger of left middle finger Having some issues with trigger finger but would like to wait until after her trip to [place]  to potentially inject this.  She will let me know when she gets back. BMI 39.0-39.9,adult 13 pounds decrease.  Significantly improved.  Will continue Trileptal at current dose.  Would not increase dose unless we plateau on weight loss or she has increasing blood sugar levels.  Follow-up 3 months.  Refill given.

## 2024-05-01 NOTE — Patient Instructions (Signed)
 Please see me in about 3 months. Enjoy your trip!

## 2024-05-01 NOTE — Assessment & Plan Note (Addendum)
 Much better controlled with candesartan  and metoprolol  and will continue both at current dose.  Follow-up 3 months no side effects specifically no dizziness

## 2024-05-01 NOTE — Assessment & Plan Note (Addendum)
 Having some issues with trigger finger but would like to wait until after her trip to [place]  to potentially inject this.  She will let me know when she gets back.

## 2024-05-01 NOTE — Assessment & Plan Note (Addendum)
 Having much better control with the addition of tirzepatide  and we will continue that and follow-up 3 months

## 2024-05-01 NOTE — Assessment & Plan Note (Addendum)
 13 pounds decrease.  Significantly improved.  Will continue Trileptal at current dose.  Would not increase dose unless we plateau on weight loss or she has increasing blood sugar levels.  Follow-up 3 months.  Refill given.

## 2024-05-22 ENCOUNTER — Other Ambulatory Visit: Payer: Self-pay | Admitting: Family Medicine

## 2024-05-22 DIAGNOSIS — I10 Essential (primary) hypertension: Secondary | ICD-10-CM

## 2024-06-06 ENCOUNTER — Ambulatory Visit (INDEPENDENT_AMBULATORY_CARE_PROVIDER_SITE_OTHER): Admitting: Physician Assistant

## 2024-06-06 ENCOUNTER — Encounter (HOSPITAL_COMMUNITY)

## 2024-06-06 ENCOUNTER — Ambulatory Visit (HOSPITAL_COMMUNITY)
Admission: RE | Admit: 2024-06-06 | Discharge: 2024-06-06 | Disposition: A | Discharge status: Home / Self Care | Source: Ambulatory Visit | Attending: Vascular Surgery | Admitting: Vascular Surgery

## 2024-06-06 ENCOUNTER — Ambulatory Visit

## 2024-06-06 VITALS — BP 105/68 | Temp 97.7°F | Ht 62.0 in | Wt 211.6 lb

## 2024-06-06 DIAGNOSIS — I6523 Occlusion and stenosis of bilateral carotid arteries: Secondary | ICD-10-CM | POA: Diagnosis not present

## 2024-06-06 DIAGNOSIS — I6522 Occlusion and stenosis of left carotid artery: Secondary | ICD-10-CM | POA: Insufficient documentation

## 2024-06-07 ENCOUNTER — Other Ambulatory Visit: Payer: Self-pay | Admitting: *Deleted

## 2024-06-07 DIAGNOSIS — I6522 Occlusion and stenosis of left carotid artery: Secondary | ICD-10-CM

## 2024-06-11 NOTE — Progress Notes (Signed)
 Office Note   History of Present Illness   Pamela Thomas is a 69 y.o. (May 09, 1956) female who presents for surveillance of carotid artery stenosis.  She recently underwent left carotid endarterectomy with bovine pericardial patch angioplasty on 02/02/2024 by Dr. Lanis.  This was done for asymptomatic high-grade left ICA stenosis.  She returns today for follow-up.  She has no complaints at today's visit.  She denies any strokelike symptoms such as slurred speech, facial droop, sudden weakness/numbness, or sudden visual changes.  She is taking her daily aspirin  and statin.  Current Outpatient Medications  Medication Sig Dispense Refill   allopurinol  (ZYLOPRIM ) 100 MG tablet Take 1 tablet by mouth on Monday, Wednesday, Friday, Sunday & Take 1/2 tablet by mouth on Tuesday, Thursday, Saturday 22 tablet 11   aspirin  EC (ASPIRIN  LOW DOSE) 81 MG tablet TAKE 1 TABLET BY MOUTH EVERY DAY 100 tablet 3   candesartan  (ATACAND ) 32 MG tablet Take 1 tablet (32 mg total) by mouth at bedtime. 90 tablet 3   ezetimibe  (ZETIA ) 10 MG tablet Take 1 tablet (10 mg total) by mouth daily. 90 tablet 3   Fluoxetine  HCl, PMDD, 20 MG TABS Take 1 tablet (20 mg total) by mouth daily. 90 tablet 3   metFORMIN  (GLUCOPHAGE ) 1000 MG tablet Take 1 tablet by mouth twice a day with food 60 tablet 11   metoprolol  succinate (TOPROL -XL) 25 MG 24 hr tablet Take 1 tablet by mouth every morning and evening 60 tablet 11   pantoprazole  (PROTONIX ) 40 MG tablet Take 1 tablet by mouth daily 30 tablet 11   rosuvastatin  (CRESTOR ) 40 MG tablet TAKE 1 TABLET BY MOUTH EVERY DAY 90 tablet 3   tirzepatide  (MOUNJARO ) 5 MG/0.5ML Pen Inject 5 mg into the skin once a week. 2 mL 3   trospium  (SANCTURA ) 20 MG tablet TAKE 1 TABLET BY MOUTH TWICE A DAY 180 tablet 1   nitroGLYCERIN  (NITROSTAT ) 0.4 MG SL tablet Place 1 tablet (0.4 mg total) under the tongue every 5 (five) minutes as needed for chest pain. (Patient not taking: Reported on 06/06/2024) 25  tablet 3   No current facility-administered medications for this visit.    REVIEW OF SYSTEMS (negative unless checked):   Cardiac:  []  Chest pain or chest pressure? []  Shortness of breath upon activity? []  Shortness of breath when lying flat? []  Irregular heart rhythm?  Vascular:  []  Pain in calf, thigh, or hip brought on by walking? []  Pain in feet at night that wakes you up from your sleep? []  Blood clot in your veins? []  Leg swelling?  Pulmonary:  []  Oxygen at home? []  Productive cough? []  Wheezing?  Neurologic:  []  Sudden weakness in arms or legs? []  Sudden numbness in arms or legs? []  Sudden onset of difficult speaking or slurred speech? []  Temporary loss of vision in one eye? []  Problems with dizziness?  Gastrointestinal:  []  Blood in stool? []  Vomited blood?  Genitourinary:  []  Burning when urinating? []  Blood in urine?  Psychiatric:  []  Major depression  Hematologic:  []  Bleeding problems? []  Problems with blood clotting?  Dermatologic:  []  Rashes or ulcers?  Constitutional:  []  Fever or chills?  Ear/Nose/Throat:  []  Change in hearing? []  Nose bleeds? []  Sore throat?  Musculoskeletal:  []  Back pain? []  Joint pain? []  Muscle pain?   Physical Examination   Vitals:   06/06/24 0843 06/06/24 0844  BP: 103/63 105/68  Temp: 97.7 F (36.5 C)   TempSrc: Temporal  Weight: 211 lb 9.6 oz (96 kg)   Height: 5' 2 (1.575 m)    Body mass index is 38.7 kg/m.  General:  WDWN in NAD; vital signs documented above Gait: Not observed HENT: WNL, normocephalic Pulmonary: normal non-labored breathing , without rales, rhonchi,  wheezing Cardiac: Regular Abdomen: soft, NT, no masses Skin: without rashes Vascular Exam/Pulses: palpable radial pulses bilaterally Extremities: without ischemic changes, without gangrene , without cellulitis; without open wounds;  Musculoskeletal: no muscle wasting or atrophy  Neurologic: A&O X 3;  No focal weakness or  paresthesias are detected Psychiatric:  The pt has Normal affect.  Non-Invasive Vascular Imaging   Bilateral Carotid Duplex (06/06/2024):  R ICA stenosis:  40-59% R VA:  patent and antegrade L ICA stenosis:  Patent carotid endarterectomy site without restenosis L VA:  patent and antegrade   Medical Decision Making   Pamela Thomas is a 68 y.o. female who presents for surveillance of carotid artery stenosis  Based on the patient's vascular studies, her right carotid artery stenosis is stable at 40 to 59%.  Her left carotid endarterectomy site is patent without restenosis She denies any strokelike symptoms such as slurred speech, facial droop, sudden visual changes, or sudden weakness/numbness.  She has palpable radial pulses bilaterally.  She has no neurological deficits on exam She will continue her daily aspirin  and statin and follow-up with our office in 9 months with repeat carotid duplex   Ahmed Holster PA-C [facility] [place] Office: [phone]  Clinic MD: Lanis

## 2024-06-19 DIAGNOSIS — C44319 Basal cell carcinoma of skin of other parts of face: Secondary | ICD-10-CM | POA: Diagnosis not present

## 2024-06-26 DIAGNOSIS — Z85828 Personal history of other malignant neoplasm of skin: Secondary | ICD-10-CM | POA: Diagnosis not present

## 2024-06-26 DIAGNOSIS — L570 Actinic keratosis: Secondary | ICD-10-CM | POA: Diagnosis not present

## 2024-06-26 DIAGNOSIS — C44619 Basal cell carcinoma of skin of left upper limb, including shoulder: Secondary | ICD-10-CM | POA: Diagnosis not present

## 2024-06-26 NOTE — Progress Notes (Signed)
 Pamela Thomas                                          MRN: [id number]   06/26/2024   The [facility] Quality Team Specialist reviewed this patient medical record for the purposes of chart review for care gap closure. The following were reviewed: chart review for care gap closure-kidney health evaluation for diabetes:eGFR  and uACR.    [facility] Quality Team

## 2024-07-13 ENCOUNTER — Encounter: Payer: Self-pay | Admitting: Family Medicine

## 2024-07-13 DIAGNOSIS — C4499 Other specified malignant neoplasm of skin, unspecified: Secondary | ICD-10-CM | POA: Insufficient documentation

## 2024-07-17 ENCOUNTER — Encounter: Payer: Self-pay | Admitting: Family Medicine

## 2024-07-17 ENCOUNTER — Other Ambulatory Visit: Payer: Self-pay | Admitting: Family Medicine

## 2024-07-19 MED ORDER — TIRZEPATIDE 7.5 MG/0.5ML ~~LOC~~ SOAJ: 7.5000 mg | SUBCUTANEOUS | 3 refills | Status: DC

## 2024-07-31 ENCOUNTER — Encounter: Payer: Self-pay | Admitting: Family Medicine

## 2024-07-31 ENCOUNTER — Ambulatory Visit: Admitting: Family Medicine

## 2024-07-31 VITALS — BP 110/62 | HR 80 | Ht 62.0 in | Wt 196.4 lb

## 2024-07-31 DIAGNOSIS — N3946 Mixed incontinence: Secondary | ICD-10-CM

## 2024-07-31 DIAGNOSIS — G4733 Obstructive sleep apnea (adult) (pediatric): Secondary | ICD-10-CM

## 2024-07-31 DIAGNOSIS — E1165 Type 2 diabetes mellitus with hyperglycemia: Secondary | ICD-10-CM | POA: Diagnosis not present

## 2024-07-31 DIAGNOSIS — Z23 Encounter for immunization: Secondary | ICD-10-CM | POA: Diagnosis not present

## 2024-07-31 LAB — POCT GLYCOSYLATED HEMOGLOBIN (HGB A1C): HbA1c, POC (controlled diabetic range): 5.7 % (ref 0.0–7.0)

## 2024-07-31 NOTE — Progress Notes (Signed)
   Discussed the use of AI scribe software for clinical note transcription with the patient, who gave verbal consent to proceed.  History of Present Illness   Pamela Thomas is a 68 year old female who presents with lightheadedness and dietary concerns related to medication use.  Lightheadedness - Significant lightheadedness, particularly during outings - Most recent episode occurred after walking with inadequate hydration and nutrition, having only consumed a fig bar - Hot and loud environment may have contributed to symptoms - Blood pressure checked by EMTs during episode was normal  Nutritional intake and appetite suppression - Frequently skips meals, often realizing by early afternoon that she has only consumed coffee - Planning to meal prep with cheese, eggs, and sandwich meat to ensure adequate protein intake - Appetite suppressed due to current medication, managed by adjusting timing of doses - Considering extending interval between medication doses to improve nutritional intake  Medication side effects - Persistent urinary issues and significant dry mouth attributed to current medication - Biotin spray have not provided relief for dry mouth  Cpap therapy - Exploring adjustments to CPAP mask, suspecting it may contribute to dry mouth symptoms  Weight management and physical activity - Recent weight loss - Aiming for a weight of 170 pounds - Considering returning to the gym for light resistance exercises        PERTINENT  PMH / PSH: I have reviewed the patient's medications, allergies, past medical and surgical history, smoking status.  Pertinent findings that relate to today's visit / issues include:   Physical Exam   Vital signs reviewed. GENERAL: Well-developed, well-nourished, no acute distress. CARDIOVASCULAR: Regular rate and rhythm no murmur gallop or rub LUNGS: Clear to auscultation bilaterally, no rales or wheeze. ABDOMEN: Soft positive bowel  sounds NEURO: No gross focal neurological deficits. MSK: Movement of extremity x 4.         Assessment and Plan    Obesity and DM type 2 Lightheadedness and fatigue likely due to inadequate nutrition and hydration, exacerbated by medication regimen affecting appetite. Target weight 160-170 lbs is reasonable. - Extend medication interval to every ten days. - Encouraged meal prepping for protein-rich foods. - Encouraged light resistance exercises twice weekly. - Ordered A1c and basic metabolic panel.  Mixed urinary incontinence Dry mouth likely from medication and CPAP use. Considering medication timing and dosage adjustments. - Adjust medication timing to midday, consider one pill daily or half twice daily. - Referred to new urogynecologist. -  OSA Continue CPAP follow-up for mask fitting.  General Health Maintenance Pap smear typically not needed after age 81 unless history of abnormal results. Considered low risk for cervical cancer. - Deferred Pap smear until after urogynecologist consultation.

## 2024-07-31 NOTE — Patient Instructions (Signed)
 RTC 3 months Have a Happy Holiday!

## 2024-08-01 LAB — BASIC METABOLIC PANEL WITH GFR
BUN/Creatinine Ratio: 18 (ref 12–28)
BUN: 28 mg/dL — ABNORMAL HIGH (ref 8–27)
CO2: 22 mmol/L (ref 20–29)
Calcium: 10.4 mg/dL — ABNORMAL HIGH (ref 8.7–10.3)
Chloride: 99 mmol/L (ref 96–106)
Creatinine, Ser: 1.54 mg/dL — ABNORMAL HIGH (ref 0.57–1.00)
Glucose: 97 mg/dL (ref 70–99)
Potassium: 4.1 mmol/L (ref 3.5–5.2)
Sodium: 137 mmol/L (ref 134–144)
eGFR: 36 mL/min/1.73 — ABNORMAL LOW (ref >59)

## 2024-08-01 LAB — MICROALBUMIN / CREATININE URINE RATIO
Creatinine, Urine: 168 mg/dL
Microalb/Creat Ratio: 201 mg/g{creat} — ABNORMAL HIGH (ref 0–29)
Microalbumin, Urine: 337.9 ug/mL

## 2024-08-02 ENCOUNTER — Ambulatory Visit: Payer: Self-pay | Admitting: Family Medicine

## 2024-08-06 NOTE — Progress Notes (Signed)
 THANA RAMP                                          MRN: [id number]   08/06/2024   The [facility] Quality Team Specialist reviewed this patient medical record for the purposes of chart review for care gap closure. The following were reviewed: abstraction for care gap closure-kidney health evaluation for diabetes:eGFR  and uACR.    [facility] Quality Team

## 2024-08-06 NOTE — Progress Notes (Signed)
 Pamela Thomas                                          MRN: [id number]   08/06/2024   The [facility] Quality Team Specialist reviewed this patient medical record for the purposes of chart review for care gap closure. The following were reviewed: abstraction for care gap closure-glycemic status assessment.    [facility]BCI Quality Team

## 2024-08-14 ENCOUNTER — Other Ambulatory Visit: Payer: Self-pay | Admitting: Family Medicine

## 2024-08-16 ENCOUNTER — Encounter: Payer: Self-pay | Admitting: Family Medicine

## 2024-09-16 ENCOUNTER — Telehealth: Payer: Self-pay

## 2024-09-16 ENCOUNTER — Ambulatory Visit

## 2024-09-16 VITALS — Ht 64.0 in | Wt 189.8 lb

## 2024-09-16 DIAGNOSIS — Z0000 Encounter for general adult medical examination without abnormal findings: Secondary | ICD-10-CM | POA: Diagnosis not present

## 2024-09-16 NOTE — Telephone Encounter (Signed)
 Patient is inquiring how she can set up her prescriptions for mail order pharmacy with [facility].  Patient stated that she never heard anything regarding the referral to [facility].  Is there anyway that someone can provide additional information regarding the mail order process and which pharmacy is best to use.  Also the Uro/Gyn referral is highly important, because the patient has been waiting on the appointment.  Please advised.  Tyshun Tuckerman N. Tomie, LPN [facility] Annual Wellness Team Direct Dial: [phone]

## 2024-09-16 NOTE — Patient Instructions (Signed)
 [name],  Thank you for taking the time for your Medicare Wellness Visit. I appreciate your continued commitment to your health goals. Please review the care plan we discussed, and feel free to reach out if I can assist you further.  Please note that Annual Wellness Visits do not include a physical exam. Some assessments may be limited, especially if the visit was conducted virtually. If needed, we may recommend an in-person follow-up with your provider.  Ongoing Care Seeing your primary care provider every 3 to 6 months helps us  monitor your health and provide consistent, personalized care.   Referrals If a referral was made during today's visit and you haven't received any updates within two weeks, please contact the referred provider directly to check on the status.  Recommended Screenings:  Health Maintenance  Topic Date Due   Medicare Annual Wellness Visit  Never done   Zoster (Shingles) Vaccine (1 of 2) Never done   Pneumococcal Vaccine for age over 71 (2 of 2 - PCV) 03/06/2015   Osteoporosis screening with Bone Density Scan  Never done   DTaP/Tdap/Td vaccine (2 - Td or Tdap) 06/14/2021   Complete foot exam   12/28/2023   COVID-19 Vaccine (8 - 2025-26 season) 05/13/2024   Eye exam for diabetics  12/13/2024   Hemoglobin A1C  01/28/2025   Yearly kidney function blood test for diabetes  07/31/2025   Yearly kidney health urinalysis for diabetes  07/31/2025   Breast Cancer Screening  12/04/2025   Colon Cancer Screening  02/03/2031   Flu Shot  Completed   Hepatitis C Screening  Completed   Meningitis B Vaccine  Aged Out       09/16/2024    8:36 AM  Advanced Directives  Does Patient Have a Medical Advance Directive? Yes  Type of Estate Agent of [place];Living will  Does patient want to make changes to medical advance directive? No - Patient declined  Copy of Healthcare Power of Attorney in Chart? No - copy requested    Vision: Annual vision  screenings are recommended for early detection of glaucoma, cataracts, and diabetic retinopathy. These exams can also reveal signs of chronic conditions such as diabetes and high blood pressure.  Dental: Annual dental screenings help detect early signs of oral cancer, gum disease, and other conditions linked to overall health, including heart disease and diabetes.  Please see the attached documents for additional preventive care recommendations.

## 2024-09-16 NOTE — Progress Notes (Addendum)
 "  Chief Complaint  Patient presents with   Medicare Wellness    INITIAL     Subjective:   Pamela Thomas is a 69 y.o. female who presents for a Medicare Annual Wellness Visit.  Visit info / Clinical Intake: Medicare Wellness Visit Type:: Initial Annual Wellness Visit Persons participating in visit and providing information:: patient Medicare Wellness Visit Mode:: Telephone If telephone:: video error Since this visit was completed virtually, some vitals may be partially provided or unavailable. Missing vitals are due to the limitations of the virtual format.: Documented vitals are patient reported If Telephone or Video please confirm:: I connected with patient using audio/video enable telemedicine. I verified patient identity with two identifiers, discussed telehealth limitations, and patient agreed to proceed. Patient Location:: PATIENT AT OFFICE SITTING IN CAR Provider Location:: OFFICE Interpreter Needed?: No Pre-visit prep was completed: yes AWV questionnaire completed by patient prior to visit?: yes Date:: 09/14/24 Living arrangements:: lives with spouse/significant other Patient's Overall Health Status Rating: good Typical amount of pain: some Does pain affect daily life?: no Are you currently prescribed opioids?: no  Dietary Habits and Nutritional Risks How many meals a day?: 2 Eats fruit and vegetables daily?: yes Most meals are obtained by: preparing own meals In the last 2 weeks, have you had any of the following?: none Diabetic:: (!) yes Any non-healing wounds?: no How often do you check your BS?: as needed Would you like to be referred to a Nutritionist or for Diabetic Management? : no  Functional Status Activities of Daily Living (to include ambulation/medication): Independent Ambulation: Independent with device- listed below Home Assistive Devices/Equipment: Eyeglasses Medication Administration: Independent Home Management (perform basic housework or  laundry): Independent Manage your own finances?: yes Primary transportation is: driving Concerns about vision?: no *vision screening is required for WTM* (READING GLASSES) Concerns about hearing?: no  Fall Screening Falls in the past year?: 0 Number of falls in past year: 0 Was there an injury with Fall?: 0 Fall Risk Category Calculator: 0 Patient Fall Risk Level: Low Fall Risk  Fall Risk Patient at Risk for Falls Due to: No Fall Risks Fall risk Follow up: Falls evaluation completed; Education provided  Home and Transportation Safety: All rugs have non-skid backing?: yes All stairs or steps have railings?: yes Grab bars in the bathtub or shower?: yes Have non-skid surface in bathtub or shower?: yes Good home lighting?: yes Regular seat belt use?: yes Hospital stays in the last year:: (!) yes How many hospital stays:: 1 Reason: TYPE II DM  Cognitive Assessment Difficulty concentrating, remembering, or making decisions? : no Will 6CIT or Mini Cog be Completed: yes What year is it?: 0 points What month is it?: 0 points Give patient an address phrase to remember (5 components): Pamela Thomas [address] About what time is it?: 0 points Count backwards from 20 to 1: 0 points Say the months of the year in reverse: 0 points Repeat the address phrase from earlier: 0 points 6 CIT Score: 0 points  Advance Directives (For Healthcare) Does Patient Have a Medical Advance Directive?: Yes Does patient want to make changes to medical advance directive?: No - Patient declined Type of Advance Directive: Healthcare Power of [place]; Living will Copy of Healthcare Power of Attorney in Chart?: No - copy requested Copy of Living Will in Chart?: No - copy requested  Reviewed/Updated  Reviewed/Updated: Reviewed All (Medical, Surgical, Family, Medications, Allergies, Care Teams, Patient Goals)    Allergies (verified) Icosapent  ethyl (epa ethyl ester) (  fish), Statins, Morphine , Morphine   sulfate, and Oxycodone    Current Medications (verified) Outpatient Encounter Medications as of 09/16/2024  Medication Sig   allopurinol  (ZYLOPRIM ) 100 MG tablet Take 1 tablet by mouth on Monday, Wednesday, Friday, Sunday & Take 1/2 tablet by mouth on Tuesday, Thursday, Saturday   aspirin  EC (ASPIRIN  LOW DOSE) 81 MG tablet TAKE 1 TABLET BY MOUTH EVERY DAY   candesartan  (ATACAND ) 32 MG tablet Take 1 tablet (32 mg total) by mouth at bedtime.   ezetimibe  (ZETIA ) 10 MG tablet Take 1 tablet (10 mg total) by mouth daily.   FLUoxetine  (PROZAC ) 20 MG tablet Take 1 tablet by mouth every day   Fluoxetine  HCl, PMDD, 20 MG TABS Take 1 tablet (20 mg total) by mouth daily.   metFORMIN  (GLUCOPHAGE ) 1000 MG tablet Take 1 tablet by mouth twice a day with food   metoprolol  succinate (TOPROL -XL) 25 MG 24 hr tablet Take 1 tablet by mouth every morning and evening   nitroGLYCERIN  (NITROSTAT ) 0.4 MG SL tablet Place 1 tablet (0.4 mg total) under the tongue every 5 (five) minutes as needed for chest pain. (Patient not taking: Reported on 06/06/2024)   pantoprazole  (PROTONIX ) 40 MG tablet Take 1 tablet by mouth daily   rosuvastatin  (CRESTOR ) 40 MG tablet TAKE 1 TABLET BY MOUTH EVERY DAY   tirzepatide  (MOUNJARO ) 7.5 MG/0.5ML Pen Inject 7.5 mg into the skin once a week.   trospium  (SANCTURA ) 20 MG tablet Take 1 tablet by mouth twice daily   No facility-administered encounter medications on file as of 09/16/2024.    History: Past Medical History:  Diagnosis Date   ALLERGIC RHINITIS, SEASONAL    Anxiety    Back pain    BCC (basal cell carcinoma of skin) 2005   [facility])   Carotid artery occlusion    Complication of anesthesia    Post operative delirium after CABG   Constipation    Coronary artery disease 2019   DES x2 placed - 2019 CABG x2 - 2019   Depression    Diabetes mellitus without complication (HCC)    DYSLIPIDEMIA    Dysrhythmia    palpitations-evaluated by Dr Delford 6/12/eccho 6/13    Glucose intolerance (impaired glucose tolerance)    Heart murmur    History of blood transfusion    Hypertension    Joint pain    Myocardial infarction (HCC) 2019   OBESITY, NOS    Osteoarthritis    rt knee   Peripheral vascular disease    Carotid Stenosis - left   PLANTAR FASCIITIS, BILATERAL    Sleep apnea    Vertigo    Vitamin D  deficiency    Past Surgical History:  Procedure Laterality Date   BREAST BIOPSY  05/27/2003   left   CARDIAC CATHETERIZATION     CARPAL TUNNEL RELEASE  05/27/2003   Right   CARPAL TUNNEL RELEASE Left    Procedure done in the office   CESAREAN SECTION  05/27/2003   COLONOSCOPY     CORONARY ARTERY BYPASS GRAFT N/A 08/17/2018   Procedure: CORONARY ARTERY BYPASS GRAFTING (CABG) TIMES TWO: LIMA to LAD, SVG to RAMUS INTERMEDIATE)  USING LEFT INTERNAL MAMMARY ARTERY AND RIGHT GREATER SAPHENOUS VEIN HARVESTED ENDOSCOPICALLY.;  Surgeon: Army Dallas NOVAK, MD;  Location: [facility] OR;  Service: Open Heart Surgery;  Laterality: N/A;   ENDARTERECTOMY Left 02/02/2024   Procedure: ENDARTERECTOMY, CAROTID;  Surgeon: Lanis Fonda BRAVO, MD;  Location: [facility] OR;  Service: Vascular;  Laterality: Left;   KNEE SURGERY  left- removal fatty tumor   PATCH ANGIOPLASTY Left 02/02/2024   Procedure: ANGIOPLASTY, USING PATCH GRAFT USING SIZE 1CM X 6CM;  Surgeon: Lanis Fonda BRAVO, MD;  Location: [facility] OR;  Service: Vascular;  Laterality: Left;   ROTATOR CUFF REPAIR     Bilateral   TEE WITHOUT CARDIOVERSION N/A 08/17/2018   Procedure: TRANSESOPHAGEAL ECHOCARDIOGRAM (TEE);  Surgeon: Army Dallas NOVAK, MD;  Location: [facility] OR;  Service: Open Heart Surgery;  Laterality: N/A;   TONSILLECTOMY     Removed as a child   TOTAL KNEE ARTHROPLASTY  10/10/2012   Procedure: TOTAL KNEE ARTHROPLASTY;  Surgeon: Dempsey LULLA Moan, MD;  Location: [facility] ORS;  Service: Orthopedics;  Laterality: Right;   Family History  Problem Relation Age of Onset   COPD Mother        Deceased   High blood pressure Mother     High Cholesterol Mother    Depression Mother    Lung cancer Father        Deceaesd   High Cholesterol Father    Diabetes Other    Melanoma Brother        Deceased   COPD Brother    Healthy Daughter    Social History   Occupational History   Occupation: retired  Tobacco Use   Smoking status: Former    Current packs/day: 0.00    Types: Cigarettes    Quit date: 09/12/1990    Years since quitting: 34.0   Smokeless tobacco: Never  Vaping Use   Vaping status: Never Used  Substance and Sexual Activity   Alcohol use: Yes    Alcohol/week: 0.0 standard drinks of alcohol    Comment: rare   Drug use: No   Sexual activity: Not Currently   Tobacco Counseling Counseling given: Not Answered  SDOH Screenings   Food Insecurity: No Food Insecurity (09/16/2024)  Housing: Low Risk (09/16/2024)  Transportation Needs: No Transportation Needs (09/16/2024)  Utilities: Not At Risk (09/16/2024)  Alcohol Screen: Low Risk (09/16/2024)  Depression (PHQ2-9): Low Risk (09/16/2024)  Financial Resource Strain: Low Risk (09/16/2024)  Physical Activity: Insufficiently Active (09/16/2024)  Social Connections: Socially Integrated (09/16/2024)  Stress: No Stress Concern Present (09/16/2024)  Tobacco Use: Medium Risk (09/16/2024)  Health Literacy: Adequate Health Literacy (09/16/2024)   See flowsheets for full screening details  Depression Screen Depression Screening Exception Documentation Depression Screening Exception:: Patient refusal  PHQ 2 & 9 Depression Scale- Over the past 2 weeks, how often have you been bothered by any of the following problems? Little interest or pleasure in doing things: 0 Feeling down, depressed, or hopeless (PHQ Adolescent also includes...irritable): 0 PHQ-2 Total Score: 0 Trouble falling or staying asleep, or sleeping too much: 0 Feeling tired or having little energy: 0 Poor appetite or overeating (PHQ Adolescent also includes...weight loss): 0 Feeling bad about yourself - or that you are  a failure or have let yourself or your family down: 0 Trouble concentrating on things, such as reading the newspaper or watching television (PHQ Adolescent also includes...like school work): 0 Moving or speaking so slowly that other people could have noticed. Or the opposite - being so fidgety or restless that you have been moving around a lot more than usual: 0 Thoughts that you would be better off dead, or of hurting yourself in some way: 0 PHQ-9 Total Score: 0 If you checked off any problems, how difficult have these problems made it for you to do your work, take care of things at home, or get along  with other people?: Not difficult at all  Depression Treatment Depression Interventions/Treatment : EYV7-0 Score <4 Follow-up Not Indicated     Goals Addressed             This Visit's Progress    LIFESTYLE: My husband and I have rejoined the gym to get more physically active.               Objective:    Today's Vitals   09/16/24 0835  Weight: 189 lb 12.8 oz (86.1 kg)  Height: 5' 4 (1.626 m)  PainSc: 0-No pain   Body mass index is 32.58 kg/m.  Hearing/Vision screen No results found. Immunizations and Health Maintenance Health Maintenance  Topic Date Due   Zoster Vaccines- Shingrix (1 of 2) Never done   Pneumococcal Vaccine: 50+ Years (2 of 2 - PCV) 03/06/2015   Bone Density Scan  Never done   DTaP/Tdap/Td (2 - Td or Tdap) 06/14/2021   FOOT EXAM  12/28/2023   COVID-19 Vaccine (8 - 2025-26 season) 05/13/2024   OPHTHALMOLOGY EXAM  12/13/2024   HEMOGLOBIN A1C  01/28/2025   Diabetic kidney evaluation - eGFR measurement  07/31/2025   Diabetic kidney evaluation - Urine ACR  07/31/2025   Medicare Annual Wellness (AWV)  09/16/2025   Mammogram  12/04/2025   Colonoscopy  02/03/2031   Influenza Vaccine  Completed   Hepatitis C Screening  Completed   Meningococcal B Vaccine  Aged Out        Assessment/Plan:  This is a routine wellness examination for Eunice.  Patient  Care Team: Rosalynn Camie CROME, MD as PCP - General (Family Medicine) Lavona Agent, MD as PCP - Cardiology (Cardiology) Porter Andrez SAUNDERS, PA-C (Inactive) as Physician Assistant (Dermatology) Marcey Elspeth PARAS, MD as Consulting Physician (Ophthalmology)  I have personally reviewed and noted the following in the patients chart:   Medical and social history Use of alcohol, tobacco or illicit drugs  Current medications and supplements including opioid prescriptions. Functional ability and status Nutritional status Physical activity Advanced directives List of other physicians Hospitalizations, surgeries, and ER visits in previous 12 months Vitals Screenings to include cognitive, depression, and falls Referrals and appointments  No orders of the defined types were placed in this encounter.  In addition, I have reviewed and discussed with patient certain preventive protocols, quality metrics, and best practice recommendations. A written personalized care plan for preventive services as well as general preventive health recommendations were provided to patient.   Roz LOISE Fuller, LPN   04/15/7972   Return in about 1 year (around 09/16/2025) for Medicare wellness.  After Visit Summary: (MyChart) Due to this being a telephonic visit, the after visit summary with patients personalized plan was offered to patient via MyChart   Nurse Notes:  Separate telephone note sent for (Referral to Uro/Gyn and [place] mail order pharmacy) HM Addressed: Vaccines Due: Pneumococcal, Dtap, Shingrix Diabetic Foot Exam recommended Bone Density Scan  "

## 2024-09-17 ENCOUNTER — Encounter: Payer: Self-pay | Admitting: Family Medicine

## 2024-09-23 NOTE — Addendum Note (Signed)
 Addended by: GLENDIA DAMIEN SQUIBB on: 09/23/2024 12:13 PM   Modules accepted: Orders

## 2024-09-24 MED ORDER — TROSPIUM CHLORIDE 20 MG PO TABS: 20.0000 mg | ORAL_TABLET | Freq: Two times a day (BID) | ORAL | 0 refills | Status: DC

## 2024-09-24 MED ORDER — CANDESARTAN CILEXETIL 32 MG PO TABS: 32.0000 mg | ORAL_TABLET | Freq: Every day | ORAL | 0 refills | Status: DC

## 2024-09-25 ENCOUNTER — Other Ambulatory Visit: Payer: Self-pay | Admitting: Family Medicine

## 2024-09-25 ENCOUNTER — Other Ambulatory Visit (HOSPITAL_COMMUNITY): Payer: Self-pay

## 2024-09-30 ENCOUNTER — Encounter: Payer: Self-pay | Admitting: Family Medicine

## 2024-09-30 ENCOUNTER — Other Ambulatory Visit: Payer: Self-pay | Admitting: Family Medicine

## 2024-09-30 NOTE — Telephone Encounter (Signed)
 Called pharmacy, as it appears the script failed to transmit on our end.   They report they have an old prescription for Mounjaro  7.5 with #3 refills. They report they do not need a new script at this time.   However, the prescription is requiring a PA.   Will forward to pharmacy team for assistance.

## 2024-10-02 ENCOUNTER — Other Ambulatory Visit (HOSPITAL_COMMUNITY): Payer: Self-pay

## 2024-10-02 ENCOUNTER — Telehealth: Payer: Self-pay

## 2024-10-02 NOTE — Telephone Encounter (Signed)
 Pharmacy Patient Advocate Encounter   Received notification from Patient Advice Request messages that prior authorization for MOUNJARO  7.5MG  is required/requested.   Insurance verification completed.   The patient is insured through [employer].   Per test claim: PA required; PA submitted to above mentioned insurance via Latent Key/confirmation #/EOC [id number]. Status is pending

## 2024-10-03 NOTE — Telephone Encounter (Signed)
 Pharmacy Patient Advocate Encounter  Received notification from [employer] that Prior Authorization for MOUNJARO  7.5MG  has been APPROVED from 10/02/24 to 09/11/25   PA #/Case ID/Reference #: [id number]

## 2024-10-17 ENCOUNTER — Other Ambulatory Visit: Payer: Self-pay | Admitting: Family Medicine

## 2024-11-21 ENCOUNTER — Ambulatory Visit: Admitting: Obstetrics and Gynecology

## 2025-09-18 ENCOUNTER — Encounter
